# Patient Record
Sex: Female | Born: 1935 | Race: White | Hispanic: No | Smoking: Former smoker
Health system: Southern US, Community
[De-identification: ages and names within clinical notes are randomized; demographics above are authoritative.]

## PROBLEM LIST (undated history)

## (undated) DIAGNOSIS — I639 Cerebral infarction, unspecified: Secondary | ICD-10-CM

## (undated) DIAGNOSIS — E039 Hypothyroidism, unspecified: Secondary | ICD-10-CM

## (undated) DIAGNOSIS — D126 Benign neoplasm of colon, unspecified: Secondary | ICD-10-CM

## (undated) DIAGNOSIS — C3490 Malignant neoplasm of unspecified part of unspecified bronchus or lung: Secondary | ICD-10-CM

## (undated) DIAGNOSIS — R011 Cardiac murmur, unspecified: Secondary | ICD-10-CM

## (undated) DIAGNOSIS — I219 Acute myocardial infarction, unspecified: Secondary | ICD-10-CM

## (undated) DIAGNOSIS — Z720 Tobacco use: Secondary | ICD-10-CM

## (undated) DIAGNOSIS — I714 Abdominal aortic aneurysm, without rupture, unspecified: Secondary | ICD-10-CM

## (undated) DIAGNOSIS — I10 Essential (primary) hypertension: Secondary | ICD-10-CM

## (undated) DIAGNOSIS — H269 Unspecified cataract: Secondary | ICD-10-CM

## (undated) DIAGNOSIS — E079 Disorder of thyroid, unspecified: Secondary | ICD-10-CM

## (undated) DIAGNOSIS — I739 Peripheral vascular disease, unspecified: Secondary | ICD-10-CM

## (undated) DIAGNOSIS — E785 Hyperlipidemia, unspecified: Secondary | ICD-10-CM

## (undated) DIAGNOSIS — Z923 Personal history of irradiation: Secondary | ICD-10-CM

## (undated) DIAGNOSIS — IMO0002 Reserved for concepts with insufficient information to code with codable children: Secondary | ICD-10-CM

## (undated) DIAGNOSIS — C189 Malignant neoplasm of colon, unspecified: Secondary | ICD-10-CM

## (undated) DIAGNOSIS — H5440 Blindness, one eye, unspecified eye: Secondary | ICD-10-CM

## (undated) DIAGNOSIS — M109 Gout, unspecified: Secondary | ICD-10-CM

## (undated) DIAGNOSIS — R931 Abnormal findings on diagnostic imaging of heart and coronary circulation: Secondary | ICD-10-CM

## (undated) DIAGNOSIS — T8859XA Other complications of anesthesia, initial encounter: Secondary | ICD-10-CM

## (undated) DIAGNOSIS — T4145XA Adverse effect of unspecified anesthetic, initial encounter: Secondary | ICD-10-CM

## (undated) DIAGNOSIS — I2510 Atherosclerotic heart disease of native coronary artery without angina pectoris: Secondary | ICD-10-CM

## (undated) DIAGNOSIS — R1900 Intra-abdominal and pelvic swelling, mass and lump, unspecified site: Secondary | ICD-10-CM

## (undated) DIAGNOSIS — M810 Age-related osteoporosis without current pathological fracture: Secondary | ICD-10-CM

## (undated) DIAGNOSIS — M1990 Unspecified osteoarthritis, unspecified site: Secondary | ICD-10-CM

## (undated) HISTORY — DX: Essential (primary) hypertension: I10

## (undated) HISTORY — DX: Reserved for concepts with insufficient information to code with codable children: IMO0002

## (undated) HISTORY — PX: TONSILLECTOMY AND ADENOIDECTOMY: SUR1326

## (undated) HISTORY — DX: Unspecified cataract: H26.9

## (undated) HISTORY — PX: COLONOSCOPY: SHX174

## (undated) HISTORY — PX: APPENDECTOMY: SHX54

## (undated) HISTORY — DX: Cardiac murmur, unspecified: R01.1

## (undated) HISTORY — DX: Atherosclerotic heart disease of native coronary artery without angina pectoris: I25.10

## (undated) HISTORY — PX: ABDOMINAL HYSTERECTOMY: SHX81

## (undated) HISTORY — PX: CATARACT EXTRACTION W/ INTRAOCULAR LENS  IMPLANT, BILATERAL: SHX1307

## (undated) HISTORY — PX: UPPER GASTROINTESTINAL ENDOSCOPY: SHX188

## (undated) HISTORY — DX: Benign neoplasm of colon, unspecified: D12.6

## (undated) HISTORY — DX: Peripheral vascular disease, unspecified: I73.9

## (undated) HISTORY — DX: Acute myocardial infarction, unspecified: I21.9

## (undated) HISTORY — PX: POLYPECTOMY: SHX149

## (undated) HISTORY — PX: EYE SURGERY: SHX253

## (undated) HISTORY — DX: Age-related osteoporosis without current pathological fracture: M81.0

## (undated) HISTORY — DX: Unspecified osteoarthritis, unspecified site: M19.90

## (undated) HISTORY — DX: Hyperlipidemia, unspecified: E78.5

## (undated) HISTORY — DX: Abdominal aortic aneurysm, without rupture: I71.4

## (undated) HISTORY — DX: Abdominal aortic aneurysm, without rupture, unspecified: I71.40

## (undated) HISTORY — DX: Disorder of thyroid, unspecified: E07.9

## (undated) HISTORY — DX: Malignant neoplasm of colon, unspecified: C18.9

## (undated) HISTORY — DX: Blindness, one eye, unspecified eye: H54.40

## (undated) HISTORY — PX: OOPHORECTOMY: SHX86

## (undated) HISTORY — DX: Tobacco use: Z72.0

## (undated) HISTORY — PX: TUBAL LIGATION: SHX77

## (undated) HISTORY — DX: Malignant neoplasm of unspecified part of unspecified bronchus or lung: C34.90

## (undated) HISTORY — DX: Cerebral infarction, unspecified: I63.9

## (undated) HISTORY — DX: Abnormal findings on diagnostic imaging of heart and coronary circulation: R93.1

---

## 1995-08-02 DIAGNOSIS — I2510 Atherosclerotic heart disease of native coronary artery without angina pectoris: Secondary | ICD-10-CM

## 1995-08-02 HISTORY — DX: Atherosclerotic heart disease of native coronary artery without angina pectoris: I25.10

## 1995-08-02 HISTORY — PX: CARDIAC CATHETERIZATION: SHX172

## 1998-08-21 ENCOUNTER — Encounter: Payer: Self-pay | Admitting: Internal Medicine

## 1998-08-21 ENCOUNTER — Ambulatory Visit (HOSPITAL_COMMUNITY): Admission: RE | Admit: 1998-08-21 | Discharge: 1998-08-21 | Payer: Self-pay | Admitting: Internal Medicine

## 1998-09-15 ENCOUNTER — Ambulatory Visit (HOSPITAL_COMMUNITY): Admission: RE | Admit: 1998-09-15 | Discharge: 1998-09-15 | Payer: Self-pay

## 1998-09-15 ENCOUNTER — Encounter: Payer: Self-pay | Admitting: Internal Medicine

## 1999-06-04 DIAGNOSIS — I639 Cerebral infarction, unspecified: Secondary | ICD-10-CM

## 1999-06-04 HISTORY — DX: Cerebral infarction, unspecified: I63.9

## 1999-11-16 ENCOUNTER — Ambulatory Visit (HOSPITAL_COMMUNITY): Admission: RE | Admit: 1999-11-16 | Discharge: 1999-11-16 | Payer: Self-pay | Admitting: Gastroenterology

## 2004-06-03 HISTORY — PX: RETINAL DETACHMENT SURGERY: SHX105

## 2004-06-11 ENCOUNTER — Ambulatory Visit: Payer: Self-pay | Admitting: Internal Medicine

## 2005-07-19 ENCOUNTER — Ambulatory Visit: Payer: Self-pay | Admitting: Internal Medicine

## 2006-08-21 ENCOUNTER — Ambulatory Visit: Payer: Self-pay | Admitting: Internal Medicine

## 2007-03-12 ENCOUNTER — Ambulatory Visit: Payer: Self-pay | Admitting: Internal Medicine

## 2007-07-16 ENCOUNTER — Encounter: Payer: Self-pay | Admitting: Internal Medicine

## 2007-07-21 ENCOUNTER — Encounter: Payer: Self-pay | Admitting: Internal Medicine

## 2007-08-04 ENCOUNTER — Ambulatory Visit: Payer: Self-pay | Admitting: Gastroenterology

## 2007-09-29 ENCOUNTER — Ambulatory Visit: Payer: Self-pay | Admitting: Gastroenterology

## 2007-09-29 ENCOUNTER — Encounter: Payer: Self-pay | Admitting: Internal Medicine

## 2007-09-29 ENCOUNTER — Encounter: Payer: Self-pay | Admitting: Gastroenterology

## 2007-10-01 ENCOUNTER — Encounter: Payer: Self-pay | Admitting: Gastroenterology

## 2007-10-07 ENCOUNTER — Ambulatory Visit: Payer: Self-pay | Admitting: Internal Medicine

## 2007-10-08 ENCOUNTER — Encounter: Payer: Self-pay | Admitting: Internal Medicine

## 2007-10-08 DIAGNOSIS — I739 Peripheral vascular disease, unspecified: Secondary | ICD-10-CM

## 2007-10-08 DIAGNOSIS — E785 Hyperlipidemia, unspecified: Secondary | ICD-10-CM

## 2007-10-08 DIAGNOSIS — Z8673 Personal history of transient ischemic attack (TIA), and cerebral infarction without residual deficits: Secondary | ICD-10-CM

## 2007-10-08 DIAGNOSIS — Z860100 Personal history of colon polyps, unspecified: Secondary | ICD-10-CM | POA: Insufficient documentation

## 2007-10-08 DIAGNOSIS — I10 Essential (primary) hypertension: Secondary | ICD-10-CM

## 2007-11-06 ENCOUNTER — Telehealth: Payer: Self-pay | Admitting: Internal Medicine

## 2007-11-18 ENCOUNTER — Encounter: Payer: Self-pay | Admitting: Internal Medicine

## 2007-12-08 ENCOUNTER — Telehealth: Payer: Self-pay | Admitting: Internal Medicine

## 2007-12-16 ENCOUNTER — Encounter: Payer: Self-pay | Admitting: Internal Medicine

## 2008-01-20 ENCOUNTER — Ambulatory Visit: Payer: Self-pay | Admitting: Internal Medicine

## 2008-01-20 DIAGNOSIS — R32 Unspecified urinary incontinence: Secondary | ICD-10-CM | POA: Insufficient documentation

## 2008-01-21 ENCOUNTER — Encounter (INDEPENDENT_AMBULATORY_CARE_PROVIDER_SITE_OTHER): Payer: Self-pay | Admitting: *Deleted

## 2008-02-04 ENCOUNTER — Encounter: Payer: Self-pay | Admitting: Internal Medicine

## 2008-04-01 ENCOUNTER — Encounter: Payer: Self-pay | Admitting: Internal Medicine

## 2008-04-13 ENCOUNTER — Encounter: Payer: Self-pay | Admitting: Internal Medicine

## 2008-11-17 ENCOUNTER — Ambulatory Visit: Payer: Self-pay | Admitting: Internal Medicine

## 2008-11-17 LAB — CONVERTED CEMR LAB
ALT: 23 units/L (ref 0–35)
AST: 21 units/L (ref 0–37)
Albumin: 3.6 g/dL (ref 3.5–5.2)
Alkaline Phosphatase: 51 units/L (ref 39–117)
BUN: 19 mg/dL (ref 6–23)
Bilirubin, Direct: 0.1 mg/dL (ref 0.0–0.3)
CO2: 29 meq/L (ref 19–32)
Calcium: 9.4 mg/dL (ref 8.4–10.5)
Chloride: 104 meq/L (ref 96–112)
Cholesterol: 135 mg/dL (ref 0–200)
Creatinine, Ser: 1.1 mg/dL (ref 0.4–1.2)
Direct LDL: 76.7 mg/dL
GFR calc non Af Amer: 51.73 mL/min (ref >60)
Glucose, Bld: 99 mg/dL (ref 70–99)
HDL: 29.3 mg/dL — ABNORMAL LOW (ref >39.00)
Potassium: 3.9 meq/L (ref 3.5–5.1)
Sodium: 141 meq/L (ref 135–145)
TSH: 5.47 microintl units/mL (ref 0.35–5.50)
Total Bilirubin: 0.5 mg/dL (ref 0.3–1.2)
Total CHOL/HDL Ratio: 5
Total Protein: 6.7 g/dL (ref 6.0–8.3)
Triglycerides: 227 mg/dL — ABNORMAL HIGH (ref 0.0–149.0)
VLDL: 45.4 mg/dL — ABNORMAL HIGH (ref 0.0–40.0)

## 2009-03-13 ENCOUNTER — Encounter: Payer: Self-pay | Admitting: Internal Medicine

## 2009-03-27 ENCOUNTER — Encounter: Payer: Self-pay | Admitting: Internal Medicine

## 2009-07-12 ENCOUNTER — Ambulatory Visit: Payer: Self-pay | Admitting: Internal Medicine

## 2009-11-21 ENCOUNTER — Encounter: Payer: Self-pay | Admitting: Internal Medicine

## 2009-11-21 ENCOUNTER — Ambulatory Visit: Payer: Self-pay | Admitting: Internal Medicine

## 2009-11-21 DIAGNOSIS — H4089 Other specified glaucoma: Secondary | ICD-10-CM

## 2009-11-21 DIAGNOSIS — Q159 Congenital malformation of eye, unspecified: Secondary | ICD-10-CM

## 2009-11-21 LAB — CONVERTED CEMR LAB
ALT: 22 units/L (ref 0–35)
AST: 23 units/L (ref 0–37)
Albumin: 3.7 g/dL (ref 3.5–5.2)
Alkaline Phosphatase: 47 units/L (ref 39–117)
BUN: 17 mg/dL (ref 6–23)
Basophils Absolute: 0 10*3/uL (ref 0.0–0.1)
Basophils Relative: 0.5 % (ref 0.0–3.0)
Bilirubin, Direct: 0.1 mg/dL (ref 0.0–0.3)
CO2: 30 meq/L (ref 19–32)
Calcium: 9.1 mg/dL (ref 8.4–10.5)
Chloride: 105 meq/L (ref 96–112)
Cholesterol: 113 mg/dL (ref 0–200)
Creatinine, Ser: 0.9 mg/dL (ref 0.4–1.2)
Eosinophils Absolute: 0.2 10*3/uL (ref 0.0–0.7)
Eosinophils Relative: 2.8 % (ref 0.0–5.0)
GFR calc non Af Amer: 61.85 mL/min (ref >60)
Glucose, Bld: 104 mg/dL — ABNORMAL HIGH (ref 70–99)
HCT: 40.7 % (ref 36.0–46.0)
HDL: 35.9 mg/dL — ABNORMAL LOW (ref >39.00)
Hemoglobin: 13.7 g/dL (ref 12.0–15.0)
LDL Cholesterol: 37 mg/dL (ref 0–99)
Lymphocytes Relative: 24 % (ref 12.0–46.0)
Lymphs Abs: 1.7 10*3/uL (ref 0.7–4.0)
MCHC: 33.8 g/dL (ref 30.0–36.0)
MCV: 92 fL (ref 78.0–100.0)
Monocytes Absolute: 0.4 10*3/uL (ref 0.1–1.0)
Monocytes Relative: 5.3 % (ref 3.0–12.0)
Neutro Abs: 4.9 10*3/uL (ref 1.4–7.7)
Neutrophils Relative %: 67.4 % (ref 43.0–77.0)
Platelets: 191 10*3/uL (ref 150.0–400.0)
Potassium: 4.1 meq/L (ref 3.5–5.1)
RBC: 4.42 M/uL (ref 3.87–5.11)
RDW: 13.4 % (ref 11.5–14.6)
Sodium: 143 meq/L (ref 135–145)
Total Bilirubin: 0.5 mg/dL (ref 0.3–1.2)
Total CHOL/HDL Ratio: 3
Total Protein: 6.3 g/dL (ref 6.0–8.3)
Triglycerides: 199 mg/dL — ABNORMAL HIGH (ref 0.0–149.0)
VLDL: 39.8 mg/dL (ref 0.0–40.0)
WBC: 7.2 10*3/uL (ref 4.5–10.5)

## 2009-11-22 ENCOUNTER — Telehealth: Payer: Self-pay | Admitting: Internal Medicine

## 2009-11-22 ENCOUNTER — Encounter: Payer: Self-pay | Admitting: Internal Medicine

## 2009-11-29 ENCOUNTER — Encounter: Payer: Self-pay | Admitting: Internal Medicine

## 2009-12-18 ENCOUNTER — Telehealth: Payer: Self-pay | Admitting: Internal Medicine

## 2010-03-15 ENCOUNTER — Encounter: Payer: Self-pay | Admitting: Internal Medicine

## 2010-04-17 ENCOUNTER — Encounter: Payer: Self-pay | Admitting: Internal Medicine

## 2010-06-24 ENCOUNTER — Encounter: Payer: Self-pay | Admitting: Internal Medicine

## 2010-07-03 NOTE — Progress Notes (Signed)
Summary: X-ray request  Phone Note Call from Patient Call back at Home Phone [phone]   Summary of Call: Patient left message on triage that she was in x-ray yesterday when the machine broke and she could not complete the x-ray. She would like to know if her order could be sent to [facility] which is closer to her home. Please advise. Initial call taken by: Lucious Groves,  November 22, 2009 2:25 PM  Follow-up for Phone Call        ok to send order to [facility]. Follow-up by: Jacques Navy MD,  November 22, 2009 2:34 PM  Additional Follow-up for Phone Call Additional follow up Details #1::        Patient notified, and will call back with appt date so I can add it to her order. Additional Follow-up by: Lucious Groves,  November 22, 2009 3:27 PM    Additional Follow-up for Phone Call Additional follow up Details #2::    Patient made me aware her appt is for the 29th. Follow-up by: Lucious Groves,  November 24, 2009 2:30 PM

## 2010-07-03 NOTE — Progress Notes (Signed)
Summary: MELOXICAM  Phone Note Call from Patient Call back at [facility] Phone [phone]   Summary of Call: Pt c/o itching from last rx for arthritis. She is req to resume meloxicam.  Initial call taken by: Lamar Sprinkles, CMA,  December 18, 2009 11:26 AM  Follow-up for Phone Call        ok to use  meloxicam 15mg  once daily #30, refill prn Follow-up by: Jacques Navy MD,  December 18, 2009 12:01 PM    New/Updated Medications: MELOXICAM 15 MG TABS (MELOXICAM) 1 once daily Prescriptions: MELOXICAM 15 MG TABS (MELOXICAM) 1 once daily  #30 x 1   Entered by:   Ami Bullins CMA   Authorized by:   Jacques Navy MD   Signed by:   Bill Salinas CMA on 12/18/2009   Method used:   Electronically to        [facility]. [phone]* (retail)       [address]       [place], [place]  [address]       Ph: [phone] or [phone]       Fax: [phone]   RxID:   [id number]

## 2010-07-03 NOTE — Assessment & Plan Note (Signed)
Summary: CONGESTION / SINUS / NO FEVER /NWS   #   Vital Signs:  Patient profile:   77 year old female Height:      62 inches Weight:      151 pounds BMI:     27.72 O2 Sat:      97 % on Room air Temp:     97.6 degrees F oral Pulse rate:   63 / minute Pulse rhythm:   regular Resp:     16 per minute BP sitting:   122 / 64  (left arm) Cuff size:   large  Vitals Entered By: Rock Nephew CMA (July 12, 2009 2:11 PM)  Nutrition Counseling: Patient's BMI is greater than 25 and therefore counseled on weight management options.  O2 Flow:  Room air  Primary Care Provider:  Norins  CC:  URI symptoms.  History of Present Illness:  URI Symptoms      This is a 76 year old woman who presents with URI symptoms.  The symptoms began 3 weeks ago.  The severity is described as mild.  The patient reports purulent nasal discharge, sore throat, and sick contacts, but denies dry cough, productive cough, and earache.  The patient denies fever, stiff neck, dyspnea, wheezing, rash, vomiting, diarrhea, use of an antipyretic, and response to antipyretic.  The patient also reports headache.  The patient denies muscle aches and severe fatigue.  Risk factors for Strep sinusitis include unilateral facial pain, unilateral nasal discharge, double sickening, tooth pain, and absence of cough.  The patient denies the following risk factors for Strep sinusitis: Strep exposure and tender adenopathy.    Preventive Screening-Counseling & Management  Alcohol-Tobacco     Alcohol drinks/day: <1     Alcohol type: spirits     Alcohol Counseling: not indicated; use of alcohol is not excessive or problematic     Smoking Status: current     Smoking Cessation Counseling: yes     Smoke Cessation Stage: precontemplative     Packs/Day: 1.0     Year Started: 1950     Year Quit: 62     Passive Smoke Exposure: no      Drug Use:  no.    Medications Prior to Update: 1)  Plavix 75 Mg Tabs (Clopidogrel Bisulfate) .... Take  1 Tab By Mouth Daily, Pt Needs Office Visit in March or April 2)  Metoprolol Tartrate 50 Mg  Tabs (Metoprolol Tartrate) .... Take 1/2 Tablet By Mouth Two Times A Day 3)  Diltiazem Hcl 120 Mg  Tabs (Diltiazem Hcl) .... Take 1 Tablet By Mouth Once A Day 4)  Lisinopril-Hydrochlorothiazide 20-12.5 Mg  Tabs (Lisinopril-Hydrochlorothiazide) .... Take 1 Tablet By Mouth Once A Day 5)  Simvastatin 80 Mg  Tabs (Simvastatin) .... Take 1 Tab By Mouth At Bedtime 6)  Tylenol Extra Strength 500 Mg  Tabs (Acetaminophen) .... As Needed 7)  Meloxicam 15 Mg  Tabs (Meloxicam) .Marland Kitchen.. 1 By Mouth Once Daily  Current Medications (verified): 1)  Plavix 75 Mg Tabs (Clopidogrel Bisulfate) .... Take 1 Tab By Mouth Daily, Pt Needs Office Visit in March or April 2)  Metoprolol Tartrate 50 Mg  Tabs (Metoprolol Tartrate) .... Take 1/2 Tablet By Mouth Two Times A Day 3)  Diltiazem Hcl 120 Mg  Tabs (Diltiazem Hcl) .... Take 1 Tablet By Mouth Once A Day 4)  Lisinopril-Hydrochlorothiazide 20-12.5 Mg  Tabs (Lisinopril-Hydrochlorothiazide) .... Take 1 Tablet By Mouth Once A Day 5)  Simvastatin 80 Mg  Tabs (Simvastatin) .Marland KitchenMarland KitchenMarland Kitchen  Take 1 Tab By Mouth At Bedtime 6)  Tylenol Extra Strength 500 Mg  Tabs (Acetaminophen) .... As Needed 7)  Meloxicam 15 Mg  Tabs (Meloxicam) .Marland Kitchen.. 1 By Mouth Once Daily  Allergies (verified): No Known Drug Allergies  Past History:  Past Medical History: Reviewed history from 10/07/2007 and no changes required. UCD Colonic polyps, hx of Coronary artery disease-cath '97 100% circ, 2nd Dx 50-60%, RCA dominant -50%: h/o MI Hyperlipidemia Hypertension Peripheral vascular disease Cerebrovascular accident, hx of     physician Roster:               Card - Dr. Allyson Sabal               GI - Dr. Dominica Severin - Dr.Jacklin  Past Surgical History: Reviewed history from 01/20/2008 and no changes required. retinal tear repair Cataract extraction-belateral w/ IOL Tubal ligation Hysterectomy with A-P  suspension '60's    G5P5 with several large babies.  Family History: Reviewed history from 10/07/2007 and no changes required. father- deceased @ 62: CAD/MI fatal, had 2 previous MI's, HTN mother - deceased @ 33: AAA-failed surgery, HTN Neg- colon cancer, DM Maternal aunt - breast cancer  Social History: Reviewed history from 10/07/2007 and no changes required. 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 End-of-life: No CPR, no mechanical ventilation, no futile/heroic measures.  Review of Systems  The patient denies anorexia, fever, weight loss, chest pain, syncope, peripheral edema, prolonged cough, headaches, hemoptysis, abdominal pain, hematuria, suspicious skin lesions, depression, and enlarged lymph nodes.    Physical Exam  General:  Well-developed,well-nourished,in no acute distress; alert,appropriate and cooperative throughout examination Head:  normocephalic and atraumatic.   Ears:  R ear normal and L ear normal.   Nose:  no external erythema, no mucosal edema, no airflow obstruction, no intranasal foreign body, no nasal polyps, no nasal mucosal lesions, no mucosal friability, no active bleeding or clots, nasal dischargemucosal pallor, L maxillary sinus tenderness, and R maxillary sinus tenderness.   Mouth:  no oral lesions Neck:  supple, no thyromegaly, and no carotid bruits.   Lungs:  normal respiratory effort, no intercostal retractions, no accessory muscle use, normal breath sounds, no dullness, no fremitus, no crackles, and no wheezes.   Heart:  normal rate, regular rhythm, and grade 1/6 HSM over RUSB.  normal rate, regular rhythm, no gallop, no rub. Abdomen:  Bowel sounds positive,abdomen soft and non-tender without masses, organomegaly or hernias noted. Msk:  No deformity or scoliosis  noted of thoracic or lumbar spine.   Pulses:  R and L carotid,radial,femoral,dorsalis pedis and posterior tibial pulses are full and equal bilaterally Extremities:  No clubbing, cyanosis, edema, or deformity noted with normal full range of motion of all joints.   Neurologic:  No cranial nerve deficits noted. Station and gait are normal. Plantar reflexes are down-going bilaterally. DTRs are symmetrical throughout. Sensory, motor and coordinative functions appear intact. Skin:  Intact without suspicious lesions or rashes Cervical Nodes:  no anterior cervical adenopathy and no posterior cervical adenopathy.   Axillary Nodes:  no R axillary adenopathy and no L axillary adenopathy.   Inguinal Nodes:  no R inguinal adenopathy and no L inguinal adenopathy.   Psych:  Cognition and judgment appear intact.  Alert and cooperative with normal attention span and concentration. No apparent delusions, illusions, hallucinations   Impression & Recommendations:  Problem # 1:  SINUSITIS- ACUTE-NOS (ICD-461.9) Assessment New  Her updated medication list for this problem includes:    Amoxicillin 500 Mg Cap (Amoxicillin) .Marland Kitchen... Take 1 capsule by mouth three times a day x 10 days  Instructed on treatment. Call if symptoms persist or worsen.   Orders: Prescription Created Electronically [phone])  Problem # 2:  TOBACCO ABUSE (ICD-305.1) Assessment: Unchanged  Encouraged smoking cessation and discussed different methods for smoking cessation.   Complete Medication List: 1)  Plavix 75 Mg Tabs (Clopidogrel bisulfate) .... Take 1 tab by mouth daily, pt needs office visit in march or april 2)  Metoprolol Tartrate 50 Mg Tabs (Metoprolol tartrate) .... Take 1/2 tablet by mouth two times a day 3)  Diltiazem Hcl 120 Mg Tabs (Diltiazem hcl) .... Take 1 tablet by mouth once a day 4)  Lisinopril-hydrochlorothiazide 20-12.5 Mg Tabs (Lisinopril-hydrochlorothiazide) .... Take 1 tablet by mouth once a day 5)  Simvastatin 80  Mg Tabs (Simvastatin) .... Take 1 tab by mouth at bedtime 6)  Tylenol Extra Strength 500 Mg Tabs (Acetaminophen) .... As needed 7)  Meloxicam 15 Mg Tabs (Meloxicam) .Marland Kitchen.. 1 by mouth once daily 8)  Amoxicillin 500 Mg Cap (Amoxicillin) .... Take 1 capsule by mouth three times a day x 10 days  Patient Instructions: 1)  Please schedule a follow-up appointment in 2 weeks. 2)  Take your antibiotic as prescribed until ALL of it is gone, but stop if you develop a rash or swelling and contact our office as soon as possible. 3)  Acute sinusitis symptoms for less than 10 days are not helped by antibiotics.Use warm moist compresses, and over the counter decongestants ( only as directed). Call if no improvement in 5-7 days, sooner if increasing pain, fever, or new symptoms. Prescriptions: AMOXICILLIN 500 MG CAP (AMOXICILLIN) Take 1 capsule by mouth three times a day X 10 days  #30 x 0   Entered and Authorized by:   Etta Grandchild MD   Signed by:   Etta Grandchild MD on 07/12/2009   Method used:   Electronically to        [facility]. [phone]* (retail)       [address]       [place], [place]  [address]       Ph: [phone] or [phone]       Fax: [phone]   RxID:   [id number]

## 2010-07-03 NOTE — Letter (Signed)
Summary: [facility]  [facility]   Imported By: Esmeralda Links D'jimraou 03/26/2010 10:42:51  _____________________________________________________________________  External Attachment:    Type:   Image     Comment:   External Document

## 2010-07-03 NOTE — Assessment & Plan Note (Signed)
Summary: YEARLY FU / LABS AFTER/ MEDICARE/NWS/  #   Vital Signs:  Patient profile:   77 year old female Height:      62 inches Weight:      144 pounds BMI:     26.43 O2 Sat:      95 % on Room air Temp:     97.3 degrees F oral Pulse rate:   56 / minute BP sitting:   112 / 58  (left arm) Cuff size:   regular  Vitals Entered By: Bill Salinas CMA (November 21, 2009 10:48 AM)  O2 Flow:  Room air CC: pt here for yearly f/u/ ab  Vision Screening:      Vision Comments: Pt had eye exam may 2011 with Dr Lottie Dawson and it was discovered that she had glaucoma and pressure build up in her left eye   Primary Care Provider:  Elina Streng  CC:  pt here for yearly f/u/ ab.  History of Present Illness: Patient presents for routine medical fdllow-up. IN the interval since her last visit she was seen Feb 9th '11 by Dr Yetta Barre for sinusitis and was successfully treated with Amoxicillin. She has had no other illness, no injuries no surgeries.   She is overdue for mammogram. She does continue to smoke. She last saw Dr. Allyson Sabal 03/13/09 and was cardiac stable. She did come to [facility] for outflow track murmur - nl EF, mild MVR, TVR andmild AV stenosis.  She has been seeing opthalmologist - has early glaucoma and is using an eye drop - she sees Dr. Gwendalyn Ege, [facility] [place], [facility] at [place] office.  Current Medications (verified): 1)  Plavix 75 Mg Tabs (Clopidogrel Bisulfate) .... Take 1 Tab By Mouth Daily, Pt Needs Office Visit in March or April 2)  Metoprolol Tartrate 50 Mg  Tabs (Metoprolol Tartrate) .... Take 1/2 Tablet By Mouth Two Times A Day 3)  Diltiazem Hcl 120 Mg  Tabs (Diltiazem Hcl) .... Take 1 Tablet By Mouth Once A Day 4)  Lisinopril-Hydrochlorothiazide 20-12.5 Mg  Tabs (Lisinopril-Hydrochlorothiazide) .... Take 1 Tablet By Mouth Once A Day 5)  Simvastatin 80 Mg  Tabs (Simvastatin) .... Take 1 Tab By Mouth At Bedtime 6)  Tylenol Extra Strength 500 Mg  Tabs (Acetaminophen) .... 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 End-of-life: No CPR, no mechanical ventilation, no futile/heroic measures.  Risk Factors: Alcohol Use: <1 (07/12/2009) Caffeine Use: 3 glasses of tea per day (11/17/2008) Diet: good diet (11/17/2008) Exercise: no (11/17/2008)  Risk Factors: Smoking Status: current (07/12/2009) Packs/Day: 1.0 (07/12/2009) Passive Smoke Exposure: no (07/12/2009)  Past Medical History: UCD GLAUCOMA ASSOCIATED W/OTH ANT SEGMENT ANOMALIES (ICD-365.43) UNSPECIFIED URINARY INCONTINENCE (ICD-788.30) TOBACCO ABUSE (ICD-305.1) CEREBROVASCULAR ACCIDENT, HX OF (ICD-V12.50) PERIPHERAL VASCULAR DISEASE (ICD-443.9) HYPERTENSION (ICD-401.9) HYPERLIPIDEMIA (ICD-272.4) CORONARY ARTERY DISEASE (ICD-414.00) COLONIC POLYPS, HX OF (ICD-V12.72)       physician Roster:               Card - Dr. Allyson Sabal  GI - Dr. Dominica Severin - Dr.Jacklin  Past Surgical History: retinal tear repairx2 Cataract extraction-belateral w/ IOL Tubal ligation Hysterectomy with A-P suspension '60's    G5P5 with several large babies.  Family History: Reviewed history from 10/07/2007 and no changes required. father- deceased @ 27: CAD/MI fatal, had 2 previous MI's, HTN mother - deceased @ 84: AAA-failed surgery, HTN Neg- colon cancer, DM Maternal aunt - breast cancer  Social History: Reviewed history from 10/07/2007 and no changes required. 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 End-of-life: No CPR, no mechanical ventilation, no futile/heroic measures.  Review of Systems       The patient complains of vision loss and incontinence.  The patient denies anorexia, fever, weight loss, weight gain, decreased hearing, chest pain, syncope, dyspnea on exertion, peripheral edema, prolonged cough, abdominal pain, melena, hematochezia, severe indigestion/heartburn, muscle weakness, suspicious skin lesions, difficulty walking, depression, unusual weight change, enlarged lymph nodes, angioedema, and breast masses.         no vision in left eye. Has bilateral hip pain.   Physical Exam  General:  Well-developed,well-nourished,in no acute distress; alert,appropriate and cooperative throughout examination Head:  Normocephalic and atraumatic without obvious abnormalities. No apparent alopecia or balding. Eyes:  no vision left eye. Right eye appears normal. C&S clear Ears:  External ear exam shows no significant lesions or deformities.  Otoscopic examination reveals clear canals, tympanic membranes are intact bilaterally without bulging, retraction, inflammation or discharge. Hearing is grossly normal bilaterally. Nose:  External nasal examination shows no deformity or inflammation. Nasal mucosa are pink and moist without lesions or exudates. Mouth:  edentulous, no oral lesions Neck:  supple, full ROM, no thyromegaly, and no carotid bruits.   Chest Wall:  no deformities.   Breasts:  No mass, nodules, thickening, tenderness, bulging, retraction, inflamation, nipple discharge or skin changes noted.   Lungs:  normal respiratory effort, no intercostal retractions, no accessory muscle use, normal breath sounds, no dullness, no crackles, and no wheezes.   Heart:  normal rate,  regular rhythm, no murmur, and no JVD.   Abdomen:  soft, non-tender, normal bowel sounds, no guarding, no rigidity, and no hepatomegaly.   Genitalia:  deferred Msk:  normal ROM, no joint tenderness, no joint swelling, no joint warmth, and no redness over joints.   Pulses:  2+ radial and DP pulses Extremities:  No clubbing, cyanosis, edema, or deformity noted with normal full range of motion of all joints.   Neurologic:  alert & oriented X3, cranial nerves II-XII intact, strength normal in all extremities, sensation intact to light touch, gait normal, and DTRs symmetrical and normal.   Skin:  erythematous/plethoric, several bruises on arms. Cervical Nodes:  No lymphadenopathy noted Axillary Nodes:  no R axillary adenopathy and no L axillary adenopathy.   Psych:  Oriented X3, memory intact for recent and remote, normally interactive, good eye contact, and not anxious appearing.     Impression & Recommendations:  Problem # 1:  GLAUCOMA ASSOCIATED W/OTH ANT SEGMENT ANOMALIES (ICD-365.43) total vision loss OS. She is under active treatment by Dr. Gwendalyn Ege  Problem # 2:  TOBACCO ABUSE (ICD-305.1) Assessment: Unchanged Stressed the importance of smoking cessation in relation to her other disease. Counselled on behavior  stratagies. In office PFTs with moderate COPD FVC 68% predicted.   Orders: T-2 View CXR (71020TC) Tobacco use cessation intermediate 3-10 minutes (99406) Spirometry w/Graph (94010)  Problem # 3:  HYPERTENSION (ICD-401.9)  Her updated medication list for this problem includes:    Metoprolol Tartrate 50 Mg Tabs (Metoprolol tartrate) .Marland Kitchen... Take 1/2 tablet by mouth two times a day    Diltiazem Hcl 120 Mg Tabs (Diltiazem hcl) .Marland Kitchen... Take 1 tablet by mouth once a day    Lisinopril-hydrochlorothiazide 20-12.5 Mg Tabs (Lisinopril-hydrochlorothiazide) .Marland Kitchen... Take 1 tablet by mouth once a day  Orders: TLB-BMP (Basic Metabolic Panel-BMET) (80048-METABOL)  BP today: 112/58 Prior BP:  122/64 (07/12/2009)  Good control on present medications.  Problem # 4:  HYPERLIPIDEMIA (ICD-272.4) Due for lab with recommendations to follow. Of note: in line with new guidelines re: simvastatin-patient hs been on high dose for over a year and has had no complications, i.e. myositis.  Her updated medication list for this problem includes:    Simvastatin 80 Mg Tabs (Simvastatin) .Marland Kitchen... Take 1 tab by mouth at bedtime  Orders: TLB-Lipid Panel (80061-LIPID) TLB-Hepatic/Liver Function Pnl (80076-HEPATIC)  Addendum - LDL 37! excellent control.  Problem # 5:  CORONARY ARTERY DISEASE (ICD-414.00) Stable. She does not have any stents with only moderate obstructive disease. She has been on Plavix - no cardiac indication.  Plan - continue all meds except plavix  The following medications were removed from the medication list:    Plavix 75 Mg Tabs (Clopidogrel bisulfate) .Marland Kitchen... Take 1 tab by mouth daily, pt needs office visit in march or april Her updated medication list for this problem includes:    Metoprolol Tartrate 50 Mg Tabs (Metoprolol tartrate) .Marland Kitchen... Take 1/2 tablet by mouth two times a day    Diltiazem Hcl 120 Mg Tabs (Diltiazem hcl) .Marland Kitchen... Take 1 tablet by mouth once a day    Lisinopril-hydrochlorothiazide 20-12.5 Mg Tabs (Lisinopril-hydrochlorothiazide) .Marland Kitchen... Take 1 tablet by mouth once a day    Aspirin 325 Mg Tabs (Aspirin) .Marland Kitchen... 1 by mouth once daily  Problem # 6:  CEREBROVASCULAR ACCIDENT, HX OF (ICD-V12.50) She has been on plavix since '01. Evidence is poor for any advantage of plavix over aspirin after 1 year of treatment.   Plan - d/c plavix           start ASA 325mg  once daily.  Orders: TLB-CBC Platelet - w/Differential (85025-CBCD)  Problem # 7:  Preventive Health Care (ICD-V70.0) history with no acute events or changes. Physical exam is normal. Labs are within normal limits.Patient will get mammogram. She is current with colonoscopy. She is advised to stop smoking. She  has no safety issues re: falls or injury. She shows no sign of depression. She is previously counselled on end of life care and her wishes. zostavax is provideed this visit.  In summary - a very nice woman who has multiple medical problems but appears stable at this time. She will return as needed or 6 months.  Complete Medication List: 1)  Metoprolol Tartrate 50 Mg Tabs (Metoprolol tartrate) .... Take 1/2 tablet by mouth two times a day 2)  Diltiazem Hcl 120 Mg Tabs (Diltiazem hcl) .... Take 1 tablet by mouth once a day 3)  Lisinopril-hydrochlorothiazide 20-12.5 Mg Tabs (Lisinopril-hydrochlorothiazide) .... Take 1 tablet by mouth once a day 4)  Simvastatin 80 Mg Tabs (Simvastatin) .... Take 1 tab by mouth at bedtime 5)  Tylenol Extra Strength 500 Mg Tabs (Acetaminophen) .... As needed 6)  Aspirin 325 Mg Tabs (  Aspirin) .Marland Kitchen.. 1 by mouth once daily 7)  Nabumetone 750 Mg Tabs (Nabumetone) .... 2 by mouth at bedtime for arthritic pain hips  Other Orders: Zoster (Shingles) Vaccine Live [phone]) Admin 1st Vaccine (72536)  Patient: Tammy Brooks Note: All result statuses are Final unless otherwise noted.  Tests: (1) BMP (METABOL)   Sodium                    143 mEq/L                   135-145   Potassium                 4.1 mEq/L                   3.5-5.1   Chloride                  105 mEq/L                   96-112   Carbon Dioxide            30 mEq/L                    19-32   Glucose              [H]  104 mg/dL                   64-40   BUN                       17 mg/dL                    3-47   Creatinine                0.9 mg/dL                   4.2-5.9   Calcium                   9.1 mg/dL                   5.6-38.7   GFR                       61.85 mL/min                >60  Tests: (2) Lipid Panel (LIPID)   Cholesterol               113 mg/dL                   5-643     ATP III Classification            Desirable:  < 200 mg/dL                    Borderline High:  200 - 239 mg/dL                High:  > = 240 mg/dL   Triglycerides        [H]  199.0 mg/dL                 3.2-951.8     Normal:  <150 mg/dL     Borderline High:  841 - 199 mg/dL   HDL                  [  L]  35.90 mg/dL                 >44.03   VLDL Cholesterol          39.8 mg/dL                  4.7-42.5   LDL Cholesterol           37 mg/dL                    9-56  CHO/HDL Ratio:  CHD Risk                             3                    Men          Women     1/2 Average Risk     3.4          3.3     Average Risk          5.0          4.4     2X Average Risk          9.6          7.1     3X Average Risk          15.0          11.0                           Tests: (3) Hepatic/Liver Function Panel (HEPATIC)   Total Bilirubin           0.5 mg/dL                   3.8-7.5   Direct Bilirubin          0.1 mg/dL                   6.4-3.3   Alkaline Phosphatase      47 U/L                      39-117   AST                       23 U/L                      0-37   ALT                       22 U/L                      0-35   Total Protein             6.3 g/dL                    2.9-5.1   Albumin                   3.7 g/dL                    8.8-4.1  Tests: (4) CBC Platelet w/Diff (CBCD)   White Cell Count          7.2 K/uL  4.5-10.5   Red Cell Count            4.42 Mil/uL                 3.87-5.11   Hemoglobin                13.7 g/dL                   36.6-44.0   Hematocrit                40.7 %                      36.0-46.0   MCV                       92.0 fl                     78.0-100.0   MCHC                      33.8 g/dL                   34.7-42.5   RDW                       13.4 %                      11.5-14.6   Platelet Count            191.0 K/uL                  150.0-400.0   Neutrophil %              67.4 %                      43.0-77.0   Lymphocyte %              24.0 %                      12.0-46.0   Monocyte %                5.3 %                       3.0-12.0    Eosinophils%              2.8 %                       0.0-5.0   Basophils %               0.5 %                       0.0-3.0   Neutrophill Absolute      4.9 K/uL                    1.4-7.7   Lymphocyte Absolute       1.7 K/uL                    0.7-4.0   Monocyte Absolute         0.4 K/uL  0.1-1.0  Eosinophils, Absolute                             0.2 K/uL                    0.0-0.7   Basophils Absolute        0.0 K/uL                    0.0-0.1Prescriptions: SIMVASTATIN 80 MG  TABS (SIMVASTATIN) Take 1 tab by mouth at bedtime  #30.0 Each x 12   Entered and Authorized by:   Jacques Navy MD   Signed by:   Jacques Navy MD on 11/21/2009   Method used:   Electronically to        [facility]. [phone]* (retail)       [address]       [place], [place]  [address]       Ph: [phone] or [phone]       Fax: [phone]   RxID:   [id number] LISINOPRIL-HYDROCHLOROTHIAZIDE 20-12.5 MG  TABS (LISINOPRIL-HYDROCHLOROTHIAZIDE) Take 1 tablet by mouth once a day  #30.0 Each x 12   Entered and Authorized by:   Jacques Navy MD   Signed by:   Jacques Navy MD on 11/21/2009   Method used:   Electronically to        [facility]. [phone]* (retail)       [address]       [place], [place]  [address]       Ph: [phone] or [phone]       Fax: [phone]   RxID:   [id number] DILTIAZEM HCL 120 MG  TABS (DILTIAZEM HCL) Take 1 tablet by mouth once a day  #30.0 Each x 12   Entered and Authorized by:   Jacques Navy MD   Signed by:   Jacques Navy MD on 11/21/2009   Method used:   Electronically to        [facility]. [phone]* (retail)       [address]       [place], [place]  [address]       Ph: [phone] or [phone]       Fax: [phone]   RxID:   [id number] METOPROLOL TARTRATE 50 MG  TABS (METOPROLOL TARTRATE) Take  1/2 tablet by mouth two times a day  #30.0 Each x 12   Entered and Authorized by:   Jacques Navy MD   Signed by:   Jacques Navy MD on 11/21/2009   Method used:   Electronically to        [facility]. [phone]* (retail)       [address]       [place], [place]  [address]       Ph: [phone] or [phone]       Fax: [phone]   RxID:   [id number] NABUMETONE 750 MG TABS (NABUMETONE) 2 by mouth at bedtime for arthritic pain hips  #60 x 12   Entered and Authorized by:   Jacques Navy MD   Signed by:   Jacques Navy MD on 11/21/2009   Method used:  Electronically to        [facility]. [phone]* (retail)       [address]       [place], [place]  [address]       Ph: [phone] or [phone]       Fax: [phone]   RxID:   [id number]    Preventive Care Screening  Last Flu Shot:    Date:  03/04/2009    Results:  given     Immunizations Administered:  Zostavax # 1:    Vaccine Type: Zostavax    Site: left arm    Mfr: Merck    Dose: 0.5 ml    Route: [place]    Given by: Ami Bullins CMA    Exp. Date: 11/10/2010    Lot #: [id number]    VIS given: 03/15/05 given November 21, 2009.

## 2010-09-07 ENCOUNTER — Other Ambulatory Visit: Payer: Self-pay | Admitting: Internal Medicine

## 2010-10-16 NOTE — Assessment & Plan Note (Signed)
[facility]                         GASTROENTEROLOGY OFFICE NOTE   NAME:Tammy Brooks, Tammy Brooks                         MRN:          [id number]  DATE:08/04/2007                            DOB:          [birth year]    REFERRING PHYSICIAN:  Rosalyn Gess. Norins, MD   PROBLEM:  Personal history of colon polyps.   HISTORY:  This is a pleasant 77 year old white female previous patient  of Dr. Jennye Boroughs who has a history of colon polyps.  She has had several  colonoscopies, the last one was done in May 2006.  At that time, she had  7 polyps removed and was also noted to have moderate left colon  diverticulosis.  Path on those polyps consistent with adenomatous  polyps.  No high-grade dysplasia, and plan was for followup in 3 years.  The patient is not having any current concerns.  No changes in her bowel  habits.  No problems with diarrhea, constipation, or abdominal pain.  Has not noted any melena or hematochezia.   PAST MEDICAL HISTORY:  Include coronary artery disease.  She is status  post MI in 67.  She did not have stents placed at that time.  Has been  maintained on Plavix since.  She is a smoker.  Also has hyperlipidemia.  Has a remote hysterectomy and had recently been diagnosed with glaucoma.  She says she had a detached retina in her left eye recently, and is  having problems with her right eye currently.  She thinks she has a  partially detached retina, and is to follow up with her ophthalmologist,  Dr. Gwendalyn Ege within the month to determine if she is going to require  surgery.  TIA in 2002.   CURRENT MEDICATIONS:  1. Metoprolol 25 b.i.d.  2. Plavix 75 daily.  3. Simvastatin 80 nightly.  4. Diltiazem 120 daily.  5. Lisinopril/hydrochlorothiazide 20/12.5 daily.  6. Prednisolone eye drops.   ALLERGIES:  No known drug allergies.   FAMILY HISTORY:  Negative for colon cancer and polyps.  Father did have  heart disease.  Mother and aunt with breast  cancer.   SOCIAL HISTORY:  The patient is widowed.  Currently lives alone.  Has 5  grown children.  She is retired.  She is a smoker.  She drinks alcohol  socially.   REVIEW OF SYSTEMS:  Pertinent for recent vision problems as described  above.  She has occasional night sweats, chronic problems with low back  pain.  Some stress urinary incontinence and muscle cramping.  Otherwise,  review of systems is as in HPI.   PHYSICAL EXAM:  Well-developed white female in no acute distress.  Height is 5 feet 4 inches, weight 158.4, blood pressure 118/52, pulse  68.  HEENT:  Normocephalic, atraumatic.  PERRLA.  Sclerae anicteric.  NECK:  Supple.  CARDIOVASCULAR:  Regular rate and rhythm with S1, S2.  No murmur, rub,  or gallop.  PULMONARY:  Clear to A and P.  ABDOMEN:  Soft and nontender.  No palpable mass or hepatosplenomegaly.  No guarding or rebound.  Bowel sounds active.  RECTAL:  Not done today.   IMPRESSION:  93. A 77 year old white female with a history of adenomatous colon      polyps, due for followup colonoscopy.  2. Coronary artery disease.  3. Previous history of TIA.  4. Glaucoma and recently detached left retina, and partially detached      right retina.   PLAN:  Schedule colonoscopy.  However, in the interval, will discuss  whether or not this is advisable prior to followup on her right eye or  whether colonoscopy should be deferred until the situation with her  right eye has resolved. We will contact her ophthalmologist. Discontinue  Plavix 5 days pre-procedure.      Mike Gip, PA-C  Electronically Signed      Venita Lick. Russella Dar, MD, [facility]  Electronically Signed   AE/MedQ  DD: 08/04/2007  DT: 08/04/2007  Job #: [id number]   cc:   Rosalyn Gess. Norins, MD

## 2010-10-19 NOTE — Procedures (Signed)
[facility]  Patient:    Tammy Brooks, Tammy Brooks                         MRN: [id number] Proc. Date: 11/16/99 Adm. Date:  [id number] Disc. Date: [id number] Attending:  Deneen Harts CC:         Rosalyn Gess. Norins, M.D. [facility]                           Procedure Report  PROCEDURE:  Colonoscopic polypectomy.  INDICATION FOR PROCEDURE:  A 77 year old white female undergoing colonoscopy with no prior history of colon polyps. Last examined 3 years ago. No interim difficulty.  DESCRIPTION OF PROCEDURE:  After reviewing the nature of the procedure with the patient including potential risks and complications, and after discussing alternative methods of diagnosis and treatment, informed consent was signed.  The patient was premedicated receiving IV sedation totaling Versed 6 mg, fentanyl 75 mcg IV administered in divided doses.  Using the Olympus PCF-140L video colonoscope, the rectum was intubated after a normal digital examination. The scope was inserted in the rectum and advanced around the entire length of the colon to the cecum identified by the appendiceal orifice and ileocecal valve. Good preparation throughout.  The scope was slowly withdrawn with careful inspection of the entire colon in a retrograde manner including retroflexed view in the rectal vault.  In the right colon, proximal, just caudad to the ileocecal valve a 1 cm raised sessile polyp, approximately 1 cm in diameter was identified. It was a bit unusual with a very small central umbilication. It was resected using electrocautery snare, recovered and submitted to pathology. No additional neoplasia was identified throughout the remainder of the colon.  Diverticular change was noted in the sigmoid colon, well isolated, non-inflamed, moderate in extent. No other abnormality was identified.  The colon was decompressed, scope withdrawn.  The patient tolerated the procedure without difficulty being  maintained on DataScope monitor low flow oxygen throughout. Returned to recovery in stable condition.  Time 2, technical 2, preparation 2, total score equals 6.  ASSESSMENT: 1. Right colon polyp, sessile. Snare resection. Pathology pending. 2. Diverticulosis--sigmoid, moderate.  RECOMMENDATION: 1. Post polypectomy instructions reviewed. 2. Follow-up pathology. 3. Repeat colonoscopy in 5 years. DD:  11/16/99 TD:  11/21/99 Job: [id number]

## 2010-10-19 NOTE — Assessment & Plan Note (Signed)
[facility]                           PRIMARY CARE OFFICE NOTE   NAME:Conkright, SHALLEN LUEDKE                         MRN:          [id number]  DATE:08/21/2006                            DOB:          1934/02/09    Ms Tammy Brooks presents for routine followup evaluation and examination.  She  was last seen in the office by Dr. Thomos Lemons July 19, 2005, for  dysuria.  Her last full examination with myself was June 11, 2004.  Please see that complete dictation for past medical history, family  history, social history and chart review.   MEDICAL HISTORY:  1. Influenza-like illness.  The patient reports an illness last week      with nausea, vomiting and abdominal pain which responded to      conservative therapy, and she is now feeling better.  2. Ophthalmology.  The patient developed a second retinal tear in June      2007 and wound up having to have surgical repair at [facility].  She also has been diagnosed with glaucoma and has had      laser therapy.  She currently has lost 90% of her vision in her      left eye.  She is on drops.  She will be returning to her      ophthalmologist and be considered for deep-laser therapy.  3. Peripheral vascular disease.  The patient was found to have a      carotid bruit by Dr. Allyson Sabal, her cardiologist.  She did undergo      carotid Dopplers which by her report were negative.   CURRENT MEDICATIONS:  1. Norvasc 5 mg daily.  2. Metoprolol 25 mg b.i.d.  3. Plavix 75 mg daily.  4. Simvastatin 80 mg at bedtime.  5. Diovan HCT 160/12.5.  6. Travatan, Cosopt and prednisolone eye drops.  7. The patient takes diclofenac p.r.n.   REVIEW OF SYSTEMS:  Is positive for night sweats but otherwise negative  for constitutional symptoms. Ophthalmology for the HPI.  The patient has  full dentures and has had no problems, no cardiovascular, respiratory,  GI problems.  She has had some mild dysuria and bladder pressure. No  musculoskeletal or dermatologic problems.   EXAMINATION:  Temperature was 97.1, blood pressure 100/72, pulse 60,  weight 164.  GENERAL APPEARANCE:  This is a well-nourished, well-developed woman in  no acute distress.  HEENT EXAMINATION: Normocephalic, atraumatic.  EACs and TMs were normal.  OROPHARYNX:  With full dentures.  No buccal lesions were noted.  Posterior pharynx was clear.  Conjunctivae and sclerae was clear.  For  further examination deferred to ophthalmology.  NECK:  Supple without thyromegaly.  NODES:  No adenopathy was noted in the cervical or supraclavicular  regions.  CHEST:  No CVA tenderness.  LUNGS:  Clear to auscultation and percussion.  CARDIOVASCULAR:  2+ radial pulses, no JVD.  She did have bilateral  carotid bruits at the lower part of her neck just above the  supraclavicular space.  She had a regular rate and rhythm  without  murmurs, rubs or gallops.  BREAST EXAMINATION:  Skin was normal, nipple was inverted on the right  which is chronic.  She had no fixed mass lesion or abnormalities on  examination.  There was no axillary adenopathy.  ABDOMEN:  Soft,  no guarding or rebound.  There was no  organosplenomegaly.  PELVIC EXAMINATION:  Was deferred with the patient being status post  hysterectomy.  RECTAL EXAMINATION:  Was deferred.  EXTREMITIES:  Without clubbing, cyanosis, edema.  No deformities were  noted.   ASSESSMENT/PLAN:  1. Hypertension.  The patient's blood pressure is very well      controlled.  She does need to have medication changes for cost      reasons.  She switched from Norvasc 5 to diltiazem 120 mg daily.      She switched from Diovan HCT to lisinopril HCT 20/12.5 mg daily.      The patient is to monitor her blood pressure at home and to notify      the office for systolics of greater 120, diastolics greater than      90.  2. Lipids.  The patient is on simvastatin and followed by Dr. Allyson Sabal.      She does have laboratory at his office.   She will continue to      follow up, and I have asked that copies of the labs be forwarded to      me.  3. Peripheral vascular disease.  The patient does have carotid bruits      low down in the neck which I suspect were common carotid.  The      patient should follow up with Dr. Allyson Sabal as instructed.  4. Ophthalmology.  The patient is under the care of [facility] staff at Dr. Sunday Corn office.  She is seen on a      regular basis and seems stable.  5. Health maintenance.  The patient was given a prescription to have a      mammogram done at [facility].  The patient has had a      colonoscopy most recently by Dr. Ritta Slot November 20, 2004 with an      adenomatous polyp found, and she is to have repeat colonoscopy in      three years.  The patient has decided she will change from Dr.      Kinnie Scales to our practice for ongoing GI care.   SUMMARY:  This a very pleasant woman who seems to be generally medically  stable at this time.  She is to follow up her blood pressure as noted  with change to medications.  She is to return to see me on a p.r.n.  basis.     Rosalyn Gess Norins, MD  Electronically Signed    MEN/MedQ  DD: 08/21/2006  DT: 08/21/2006  Job #: [id number]   cc:   Nanetta Batty, M.D.

## 2010-11-07 ENCOUNTER — Other Ambulatory Visit: Payer: Self-pay | Admitting: Internal Medicine

## 2010-11-15 ENCOUNTER — Encounter: Payer: Self-pay | Admitting: Gastroenterology

## 2010-11-15 ENCOUNTER — Ambulatory Visit (AMBULATORY_SURGERY_CENTER): Payer: Medicare Other | Admitting: *Deleted

## 2010-11-15 VITALS — Ht 64.0 in | Wt 154.0 lb

## 2010-11-15 DIAGNOSIS — Z86010 Personal history of colonic polyps: Secondary | ICD-10-CM

## 2010-11-15 MED ORDER — PEG-KCL-NACL-NASULF-NA ASC-C 100 G PO SOLR: ORAL | Status: DC

## 2010-11-16 ENCOUNTER — Telehealth: Payer: Self-pay | Admitting: Gastroenterology

## 2010-11-19 NOTE — Telephone Encounter (Signed)
PATIENT STILL NOT ANSWERING PHONE,NO ID. DID NOT LEAVE MESSAGE.

## 2010-11-29 ENCOUNTER — Encounter: Payer: Self-pay | Admitting: Gastroenterology

## 2010-11-29 ENCOUNTER — Ambulatory Visit (AMBULATORY_SURGERY_CENTER): Payer: Medicare Other | Admitting: Gastroenterology

## 2010-11-29 VITALS — BP 148/75 | HR 78 | Temp 96.8°F | Resp 18 | Ht 64.0 in | Wt 154.0 lb

## 2010-11-29 DIAGNOSIS — Z86010 Personal history of colonic polyps: Secondary | ICD-10-CM

## 2010-11-29 DIAGNOSIS — K5730 Diverticulosis of large intestine without perforation or abscess without bleeding: Secondary | ICD-10-CM

## 2010-11-29 DIAGNOSIS — D126 Benign neoplasm of colon, unspecified: Secondary | ICD-10-CM

## 2010-11-29 DIAGNOSIS — Z1211 Encounter for screening for malignant neoplasm of colon: Secondary | ICD-10-CM

## 2010-11-29 MED ORDER — SODIUM CHLORIDE 0.9 % IV SOLN: 500.0000 mL | INTRAVENOUS | Status: DC

## 2010-11-29 NOTE — Patient Instructions (Signed)
Follow discharge instructions.  Resume your medications.  High Fiber Diet with Liberal fluid intake.  Await pathology results.

## 2010-11-30 ENCOUNTER — Telehealth: Payer: Self-pay

## 2010-11-30 NOTE — Telephone Encounter (Signed)
No ID on answering machine. 

## 2010-12-06 ENCOUNTER — Encounter: Payer: Self-pay | Admitting: Gastroenterology

## 2010-12-07 ENCOUNTER — Other Ambulatory Visit: Payer: Self-pay | Admitting: Internal Medicine

## 2011-01-09 ENCOUNTER — Other Ambulatory Visit: Payer: Self-pay | Admitting: Internal Medicine

## 2011-02-12 ENCOUNTER — Other Ambulatory Visit: Payer: Self-pay | Admitting: Internal Medicine

## 2011-03-14 ENCOUNTER — Other Ambulatory Visit: Payer: Self-pay | Admitting: Internal Medicine

## 2011-04-11 ENCOUNTER — Other Ambulatory Visit: Payer: Self-pay | Admitting: Internal Medicine

## 2011-05-16 ENCOUNTER — Other Ambulatory Visit: Payer: Self-pay | Admitting: Internal Medicine

## 2011-05-22 ENCOUNTER — Other Ambulatory Visit: Payer: Self-pay | Admitting: *Deleted

## 2011-05-22 MED ORDER — DILTIAZEM HCL 120 MG PO TABS: 120.0000 mg | ORAL_TABLET | Freq: Every day | ORAL | Status: DC

## 2011-05-22 MED ORDER — LISINOPRIL-HYDROCHLOROTHIAZIDE 20-12.5 MG PO TABS: 1.0000 | ORAL_TABLET | Freq: Every day | ORAL | Status: DC

## 2011-06-21 ENCOUNTER — Telehealth: Payer: Self-pay | Admitting: *Deleted

## 2011-06-21 MED ORDER — SIMVASTATIN 80 MG PO TABS: 80.0000 mg | ORAL_TABLET | Freq: Every day | ORAL | Status: DC

## 2011-06-21 MED ORDER — LISINOPRIL-HYDROCHLOROTHIAZIDE 20-12.5 MG PO TABS: 1.0000 | ORAL_TABLET | Freq: Every day | ORAL | Status: DC

## 2011-06-21 MED ORDER — METOPROLOL TARTRATE 50 MG PO TABS: 50.0000 mg | ORAL_TABLET | Freq: Two times a day (BID) | ORAL | Status: DC

## 2011-06-21 MED ORDER — DILTIAZEM HCL 120 MG PO TABS: 120.0000 mg | ORAL_TABLET | Freq: Every day | ORAL | Status: DC

## 2011-06-21 MED ORDER — MELOXICAM 15 MG PO TABS: 15.0000 mg | ORAL_TABLET | Freq: Every day | ORAL | Status: DC

## 2011-06-21 NOTE — Telephone Encounter (Signed)
Left msg with pt notifying her that she is not allowed anymore refills on meds until an OV is scheduled.

## 2011-06-21 NOTE — Telephone Encounter (Signed)
OK to refill meds per MD. OV scheduled for 2.25.13

## 2011-07-22 ENCOUNTER — Other Ambulatory Visit: Payer: Self-pay

## 2011-07-22 MED ORDER — SIMVASTATIN 80 MG PO TABS: 80.0000 mg | ORAL_TABLET | Freq: Every day | ORAL | Status: DC

## 2011-07-22 MED ORDER — DILTIAZEM HCL 120 MG PO TABS: 120.0000 mg | ORAL_TABLET | Freq: Every day | ORAL | Status: DC

## 2011-07-22 MED ORDER — METOPROLOL TARTRATE 50 MG PO TABS: 50.0000 mg | ORAL_TABLET | Freq: Two times a day (BID) | ORAL | Status: DC

## 2011-07-22 MED ORDER — MELOXICAM 15 MG PO TABS: 15.0000 mg | ORAL_TABLET | Freq: Every day | ORAL | Status: DC

## 2011-07-22 MED ORDER — LISINOPRIL-HYDROCHLOROTHIAZIDE 20-12.5 MG PO TABS: 1.0000 | ORAL_TABLET | Freq: Every day | ORAL | Status: DC

## 2011-07-22 NOTE — Telephone Encounter (Signed)
Pt called requesting 1 more refill to last until upcoming appt

## 2011-07-24 ENCOUNTER — Other Ambulatory Visit: Payer: Self-pay | Admitting: *Deleted

## 2011-07-24 MED ORDER — METOPROLOL TARTRATE 50 MG PO TABS: ORAL_TABLET | ORAL | Status: DC

## 2011-07-24 NOTE — Progress Notes (Signed)
Correction on Sig:

## 2011-07-29 ENCOUNTER — Ambulatory Visit (INDEPENDENT_AMBULATORY_CARE_PROVIDER_SITE_OTHER): Payer: Medicare Other | Admitting: Internal Medicine

## 2011-07-29 ENCOUNTER — Encounter: Payer: Self-pay | Admitting: Internal Medicine

## 2011-07-29 ENCOUNTER — Other Ambulatory Visit (INDEPENDENT_AMBULATORY_CARE_PROVIDER_SITE_OTHER): Payer: Medicare Other

## 2011-07-29 VITALS — BP 128/72 | HR 52 | Temp 96.9°F | Resp 16 | Ht 63.0 in | Wt 155.0 lb

## 2011-07-29 DIAGNOSIS — E785 Hyperlipidemia, unspecified: Secondary | ICD-10-CM

## 2011-07-29 DIAGNOSIS — F17200 Nicotine dependence, unspecified, uncomplicated: Secondary | ICD-10-CM

## 2011-07-29 DIAGNOSIS — Z86010 Personal history of colonic polyps: Secondary | ICD-10-CM

## 2011-07-29 DIAGNOSIS — Z0000 Encounter for general adult medical examination without abnormal findings: Secondary | ICD-10-CM

## 2011-07-29 DIAGNOSIS — I10 Essential (primary) hypertension: Secondary | ICD-10-CM

## 2011-07-29 DIAGNOSIS — Z1239 Encounter for other screening for malignant neoplasm of breast: Secondary | ICD-10-CM

## 2011-07-29 DIAGNOSIS — I739 Peripheral vascular disease, unspecified: Secondary | ICD-10-CM

## 2011-07-29 DIAGNOSIS — I2510 Atherosclerotic heart disease of native coronary artery without angina pectoris: Secondary | ICD-10-CM

## 2011-07-29 DIAGNOSIS — Z8679 Personal history of other diseases of the circulatory system: Secondary | ICD-10-CM

## 2011-07-29 LAB — LIPID PANEL
HDL: 40.4 mg/dL (ref >39.00)
Triglycerides: 170 mg/dL — ABNORMAL HIGH (ref 0.0–149.0)
VLDL: 34 mg/dL (ref 0.0–40.0)

## 2011-07-29 LAB — HEPATIC FUNCTION PANEL
ALT: 19 U/L (ref 0–35)
Albumin: 3.6 g/dL (ref 3.5–5.2)
Total Bilirubin: 0.5 mg/dL (ref 0.3–1.2)

## 2011-07-29 LAB — COMPREHENSIVE METABOLIC PANEL
ALT: 19 U/L (ref 0–35)
AST: 21 U/L (ref 0–37)
Alkaline Phosphatase: 45 U/L (ref 39–117)
CO2: 30 mEq/L (ref 19–32)
Creatinine, Ser: 0.8 mg/dL (ref 0.4–1.2)
GFR: 74.16 mL/min (ref >60.00)
Sodium: 142 mEq/L (ref 135–145)
Total Bilirubin: 0.5 mg/dL (ref 0.3–1.2)
Total Protein: 6.3 g/dL (ref 6.0–8.3)

## 2011-07-29 LAB — TSH: TSH: 6.19 u[IU]/mL — ABNORMAL HIGH (ref 0.35–5.50)

## 2011-07-29 MED ORDER — DILTIAZEM HCL 120 MG PO TABS: 120.0000 mg | ORAL_TABLET | Freq: Every day | ORAL | Status: DC

## 2011-07-29 MED ORDER — SIMVASTATIN 80 MG PO TABS: 80.0000 mg | ORAL_TABLET | Freq: Every day | ORAL | Status: DC

## 2011-07-29 MED ORDER — METOPROLOL TARTRATE 50 MG PO TABS: ORAL_TABLET | ORAL | Status: DC

## 2011-07-29 MED ORDER — MELOXICAM 15 MG PO TABS: 15.0000 mg | ORAL_TABLET | Freq: Every day | ORAL | Status: DC

## 2011-07-29 MED ORDER — LISINOPRIL-HYDROCHLOROTHIAZIDE 20-12.5 MG PO TABS: 1.0000 | ORAL_TABLET | Freq: Every day | ORAL | Status: DC

## 2011-07-29 NOTE — Patient Instructions (Signed)
Sinus congestion - no sign of continued infection. It is ok to use sudafed (generic) 30 mg two or three times a day for congestion.  Normal exam today. Letter to follow

## 2011-07-29 NOTE — Progress Notes (Signed)
Subjective:    Patient ID: Tammy Brooks, female    DOB: 09-Oct-1933, 78 y.o.   MRN: [id number]  HPI The patient is here for annual Medicare wellness examination and management of other chronic and acute problems.   Interval she had a sinus infection. She was seen at an Urgent care center - given Amoxicillin and cough syrup. She still has some pressure in her sinuses. She has seen Dr. Allyson Sabal - had diagnositic 2 D echo and carotid dopplers with no change is obstruction.   The risk factors are reflected in the social history.  The roster of all physicians providing medical care to patient - is listed in the Snapshot section of the chart.  Activities of daily living:  The patient is 100% inedpendent in all ADLs: dressing, toileting, feeding as well as dependent mobility -  Doesn't drive due to blindness left eye.   Home safety : The patient has smoke detectors in the home. Home is fall safe. No grab bars in the bathroom. They wear seatbelts.  Firearms are present in the home, kept in a safe fashion. There is no violence in the home.   There is no risks for hepatitis, STDs or HIV. There is no history of blood transfusion. They have no travel history to infectious disease endemic areas of the world.  The patient has not seen their dentist in the last six month -  Full dentures. They have seen their eye doctor in the last year. They deny any hearing difficulty and have not had audiologic testing in the last year.  They do not  have excessive sun exposure. Discussed the need for sun protection: hats, long sleeves and use of sunscreen if there is significant sun exposure.   Diet: the importance of a healthy diet is discussed. They do have a healthy (unhealthy-high fat/fast food) diet.  The patient has no regular exercise program.  The benefits of regular aerobic exercise were discussed.  Depression screen: there are no signs or vegative symptoms of depression- irritability, change in appetite,  anhedonia, sadness/tearfullness.  Cognitive assessment: the patient manages all their financial and personal affairs and is actively engaged.   The following portions of the patient's history were reviewed and updated as appropriate: allergies, current medications, past family history, past medical history,  past surgical history, past social history  and problem list.  Vision, hearing, body mass index were assessed and reviewed.   During the course of the visit the patient was educated and counseled about appropriate screening and preventive services including : fall prevention , diabetes screening, nutrition counseling, colorectal cancer screening, and recommended immunizations.  Past Medical History  Diagnosis Date  . Arthritis   . Blind left eye   . Heart murmur   . Hyperlipidemia   . Hypertension   . Myocardial infarction     mild/ age 76  . Stroke 2001    no paralysis  . Ulcer    Past Surgical History  Procedure Date  . Cataract extraction w/ intraocular lens  implant, bilateral   . Eye surgery     lense implant removed/ blind in left eye  . Upper gastrointestinal endoscopy     treated for h-pylori  . Colonoscopy   . Polypectomy   . Tubal ligation   . Appendectomy   . Tonsillectomy and adenoidectomy    Family History  Problem Relation Age of Onset  . Aneurysm Mother     AAA  . Hypertension Mother   . Heart  disease Father     CAD, MI x 3  . Cancer Maternal Aunt     breast cancer   History   Social History  . Marital Status: Widowed    Spouse Name: N/A    Number of Children: 5  . Years of Education: 9   Occupational History  . Psychiatric nurse     retired   Social History Main Topics  . Smoking status: Current Everyday Smoker -- 1.0 packs/day    Types: Cigarettes  . Smokeless tobacco: Never Used  . Alcohol Use: 1.0 oz/week    2 drink(s) per week  . Drug Use: No  . Sexually Active: Not Currently   Other Topics Concern  . Not on file   Social  History Narrative   9th grade. married '54- widowed Dec 10th'08. 4 sons - '55, '58, '60, '69; 1 daughter - '63; 12 grandchildren; 9 great-grandchildren. 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 Other children are close and attentive. ACP/End-of-life: No CPR, no mechanical ventilation, no futile/heroic measures.      Review of Systems Constitutional:  Negative for fever, chills, activity change and unexpected weight change.  HEENT:  Negative for hearing loss, ear pain, congestion, neck stiffness and postnasal drip. Negative for sore throat or swallowing problems. Negative for dental complaints.   Eyes: Positive for vision loss left eye with no other change in visual acuity.  Respiratory: Negative for chest tightness and wheezing. Negative for DOE.   Cardiovascular: Negative for chest pain or palpitations.  decreased exercise tolerance Gastrointestinal: No change in bowel habit. No bloating or gas. No reflux or indigestion Genitourinary: Negative for urgency, frequency, flank pain and difficulty urinating.  Musculoskeletal: Negative for myalgias, back pain, arthralgias and gait problem.  Neurological: Negative for dizziness, tremors, weakness and headaches.  Hematological: Negative for adenopathy.  Psychiatric/Behavioral: Negative for behavioral problems and dysphoric mood.   Current Outpatient Prescriptions on File Prior to Visit  Medication Sig Dispense Refill  . aspirin 81 MG tablet Take 81 mg by mouth daily.        . dorzolamide-timolol (COSOPT) 22.3-6.8 MG/ML ophthalmic solution Place 1 drop into the left eye daily.              Metoprolol 50 mg daily       Lisinopril/hctz 20/12.5  Daily       Diltiazem CD 120 mg Daily       Simvastatin 80 mg  Daily       Meloxicam 15 mg daily                             Objective:   Physical Exam  Filed Vitals:   07/29/11 0908  BP: 128/72  Pulse: 52  Temp: 96.9 F (36.1 C)  Resp: 16    Gen'l: well nourished, well developed white woman in no distress HEENT - [place]/AT, EACs/TMs normal, oropharynx with full dentures, no buccal lesions, posterior pharynx clear, mucous membranes moist. C&S clear, PERRLA, fundi - normal Neck - supple, no thyromegaly Nodes- negative submental, cervical, supraclavicular regions Chest - no deformity, no CVAT Lungs - cleat without rales, wheezes. No increased work of breathing Breast - skin normal, nipples w/o discharge, no fixed mass or lesion, no axillary adenopathy Cardiovascular - regular rate and rhythm, quiet precordium, no murmurs, rubs or gallops, 2+ radial, DP and PT pulses Abdomen - BS+ x 4, no HSM,  no guarding or rebound or tenderness Pelvic - deferred s/po hysterectomy Rectal - deferred  Extremities - no clubbing, cyanosis, edema or deformity.  Neuro - A&O x 3, CN II-XII normal, motor strength normal and equal, DTRs 2+ and symmetrical biceps, radial, and patellar tendons. Cerebellar - no tremor, no rigidity, fluid movement and normal gait. Derm - Head, neck, back, abdomen and extremities without suspicious lesions  Lab Results  Component Value Date   WBC 7.2 11/21/2009   HGB 13.7 11/21/2009   HCT 40.7 11/21/2009   PLT 191.0 11/21/2009   GLUCOSE 111* 07/29/2011   CHOL 123 07/29/2011   TRIG 170.0* 07/29/2011   HDL 40.40 07/29/2011   LDLDIRECT 76.7 11/17/2008   LDLCALC 49 07/29/2011        ALT 19 07/29/2011   AST 21 07/29/2011        NA 142 07/29/2011   K 3.9 07/29/2011   CL 108 07/29/2011   CREATININE 0.8 07/29/2011   BUN 20 07/29/2011   CO2 30 07/29/2011   TSH 6.19* 07/29/2011         Assessment & Plan:

## 2011-07-30 ENCOUNTER — Encounter: Payer: Self-pay | Admitting: Internal Medicine

## 2011-07-30 NOTE — Assessment & Plan Note (Signed)
Stable. No recurrent problems. No sequelae.  Plan - continue risk reduction

## 2011-07-30 NOTE — Assessment & Plan Note (Signed)
BP Readings from Last 3 Encounters:  07/29/11 128/72  11/29/10 148/75  11/21/09 112/58   Adequate control of hypertension on existing medications. No change proposed

## 2011-07-30 NOTE — Assessment & Plan Note (Signed)
Excellent lab results with LDL much better than goal of 100 or less. She has been on a CCB and high dose simvastatin for more than a year w/o any side effects or complications. FDA warnings are known but in accord with FDA recommendations since she has had no problems will make no change in medication.

## 2011-07-30 NOTE — Assessment & Plan Note (Signed)
Current with Dr. Allyson Sabal at [facility] - she reports she just had 2 D echo and carotid doppler - results pending. She has been asymptomatic.   Plan - continue risk reduction.

## 2011-07-30 NOTE — Assessment & Plan Note (Signed)
Carotid studies not available but by her report she has not had progressive disease. NO carotid bruit on exam.  Plan - continued risk reduction.

## 2011-07-30 NOTE — Assessment & Plan Note (Signed)
Patient continues to smoke. She has been counseled to quit. She is pre-contemplative and not interested in quitting at this time.

## 2011-07-30 NOTE — Assessment & Plan Note (Signed)
Last study June '12 - small tubular adenoma removed.  Plan - follow-up per GI

## 2011-07-30 NOTE — Assessment & Plan Note (Signed)
Interval medical history is benign. Physical exam, sans pelvic, is normal. Lab results are good. Current with colonoscopy. Last mammogram '00, normal exam today - further mammograms are optional (USPHTF). Immunizations are up to date.  In summary - a nice woman who is medically stable. She is followed closely by Dr. Allyson Sabal in regard to vascular disease. She is adamantly encouraged to stop smoking. She will return as needed or in 1 year.

## 2011-08-19 ENCOUNTER — Encounter: Payer: Self-pay | Admitting: Internal Medicine

## 2011-09-19 ENCOUNTER — Other Ambulatory Visit: Payer: Self-pay

## 2011-09-19 MED ORDER — LISINOPRIL-HYDROCHLOROTHIAZIDE 20-12.5 MG PO TABS: 1.0000 | ORAL_TABLET | Freq: Every day | ORAL | Status: DC

## 2012-03-03 DIAGNOSIS — R931 Abnormal findings on diagnostic imaging of heart and coronary circulation: Secondary | ICD-10-CM

## 2012-03-03 HISTORY — DX: Abnormal findings on diagnostic imaging of heart and coronary circulation: R93.1

## 2012-03-14 ENCOUNTER — Encounter (HOSPITAL_COMMUNITY): Payer: Self-pay | Admitting: *Deleted

## 2012-03-14 ENCOUNTER — Observation Stay (HOSPITAL_COMMUNITY)
Admission: EM | Admit: 2012-03-14 | Discharge: 2012-03-15 | Disposition: A | Discharge status: Home / Self Care | Payer: Medicare Other | Source: Other Acute Inpatient Hospital | Attending: Internal Medicine | Admitting: Internal Medicine

## 2012-03-14 DIAGNOSIS — R079 Chest pain, unspecified: Principal | ICD-10-CM

## 2012-03-14 DIAGNOSIS — E785 Hyperlipidemia, unspecified: Secondary | ICD-10-CM | POA: Diagnosis present

## 2012-03-14 DIAGNOSIS — M109 Gout, unspecified: Secondary | ICD-10-CM | POA: Insufficient documentation

## 2012-03-14 DIAGNOSIS — I10 Essential (primary) hypertension: Secondary | ICD-10-CM

## 2012-03-14 DIAGNOSIS — J449 Chronic obstructive pulmonary disease, unspecified: Secondary | ICD-10-CM | POA: Insufficient documentation

## 2012-03-14 DIAGNOSIS — R918 Other nonspecific abnormal finding of lung field: Secondary | ICD-10-CM

## 2012-03-14 DIAGNOSIS — E860 Dehydration: Secondary | ICD-10-CM

## 2012-03-14 DIAGNOSIS — I2510 Atherosclerotic heart disease of native coronary artery without angina pectoris: Secondary | ICD-10-CM

## 2012-03-14 DIAGNOSIS — I350 Nonrheumatic aortic (valve) stenosis: Secondary | ICD-10-CM

## 2012-03-14 DIAGNOSIS — J4489 Other specified chronic obstructive pulmonary disease: Secondary | ICD-10-CM | POA: Insufficient documentation

## 2012-03-14 DIAGNOSIS — R0789 Other chest pain: Secondary | ICD-10-CM | POA: Diagnosis present

## 2012-03-14 LAB — CBC
Hemoglobin: 13.8 g/dL (ref 12.0–15.0)
MCHC: 33 g/dL (ref 30.0–36.0)

## 2012-03-14 MED ORDER — ALBUTEROL SULFATE (5 MG/ML) 0.5% IN NEBU: 2.5000 mg | INHALATION_SOLUTION | RESPIRATORY_TRACT | Status: DC | PRN

## 2012-03-14 MED ORDER — ALUM & MAG HYDROXIDE-SIMETH 200-200-20 MG/5ML PO SUSP: 30.0000 mL | Freq: Four times a day (QID) | ORAL | Status: DC | PRN

## 2012-03-14 MED ORDER — HYDROCODONE-ACETAMINOPHEN 5-325 MG PO TABS: 1.0000 | ORAL_TABLET | ORAL | Status: DC | PRN

## 2012-03-14 MED ORDER — ONDANSETRON HCL 4 MG/2ML IJ SOLN: 4.0000 mg | Freq: Four times a day (QID) | INTRAMUSCULAR | Status: DC | PRN

## 2012-03-14 MED ORDER — ACETAMINOPHEN 325 MG PO TABS: 650.0000 mg | ORAL_TABLET | Freq: Four times a day (QID) | ORAL | Status: DC | PRN

## 2012-03-14 MED ORDER — ENOXAPARIN SODIUM 40 MG/0.4ML ~~LOC~~ SOLN
40.0000 mg | SUBCUTANEOUS | Status: DC
  Administered 2012-03-15: 40 mg via SUBCUTANEOUS
  Filled 2012-03-14 (×2): qty 0.4

## 2012-03-14 MED ORDER — DOCUSATE SODIUM 100 MG PO CAPS
100.0000 mg | ORAL_CAPSULE | Freq: Two times a day (BID) | ORAL | Status: DC
  Administered 2012-03-15: 100 mg via ORAL
  Filled 2012-03-14 (×2): qty 1

## 2012-03-14 MED ORDER — SODIUM CHLORIDE 0.9 % IJ SOLN: 3.0000 mL | Freq: Two times a day (BID) | INTRAMUSCULAR | Status: DC

## 2012-03-14 MED ORDER — ASPIRIN EC 81 MG PO TBEC
81.0000 mg | DELAYED_RELEASE_TABLET | Freq: Every day | ORAL | Status: DC
  Administered 2012-03-15: 81 mg via ORAL
  Filled 2012-03-14: qty 1

## 2012-03-14 MED ORDER — ACETAMINOPHEN 650 MG RE SUPP: 650.0000 mg | Freq: Four times a day (QID) | RECTAL | Status: DC | PRN

## 2012-03-14 MED ORDER — SODIUM CHLORIDE 0.9 % IJ SOLN: 3.0000 mL | INTRAMUSCULAR | Status: DC | PRN

## 2012-03-14 MED ORDER — GUAIFENESIN-DM 100-10 MG/5ML PO SYRP: 5.0000 mL | ORAL_SOLUTION | ORAL | Status: DC | PRN

## 2012-03-14 MED ORDER — SODIUM CHLORIDE 0.9 % IV SOLN: 250.0000 mL | INTRAVENOUS | Status: DC | PRN

## 2012-03-14 MED ORDER — ONDANSETRON HCL 4 MG PO TABS: 4.0000 mg | ORAL_TABLET | Freq: Four times a day (QID) | ORAL | Status: DC | PRN

## 2012-03-14 MED ORDER — MORPHINE SULFATE 2 MG/ML IJ SOLN: 2.0000 mg | INTRAMUSCULAR | Status: DC | PRN

## 2012-03-14 MED ORDER — SODIUM CHLORIDE 0.9 % IJ SOLN
3.0000 mL | Freq: Two times a day (BID) | INTRAMUSCULAR | Status: DC
  Administered 2012-03-15: 3 mL via INTRAVENOUS

## 2012-03-15 ENCOUNTER — Encounter (HOSPITAL_COMMUNITY): Payer: Self-pay | Admitting: Internal Medicine

## 2012-03-15 DIAGNOSIS — I10 Essential (primary) hypertension: Secondary | ICD-10-CM

## 2012-03-15 DIAGNOSIS — E860 Dehydration: Secondary | ICD-10-CM | POA: Diagnosis present

## 2012-03-15 DIAGNOSIS — R918 Other nonspecific abnormal finding of lung field: Secondary | ICD-10-CM | POA: Diagnosis present

## 2012-03-15 DIAGNOSIS — I359 Nonrheumatic aortic valve disorder, unspecified: Secondary | ICD-10-CM

## 2012-03-15 DIAGNOSIS — I2510 Atherosclerotic heart disease of native coronary artery without angina pectoris: Secondary | ICD-10-CM

## 2012-03-15 DIAGNOSIS — R079 Chest pain, unspecified: Principal | ICD-10-CM

## 2012-03-15 LAB — TROPONIN I
Troponin I: <0.3 ng/mL (ref <0.30)
Troponin I: <0.3 ng/mL (ref <0.30)

## 2012-03-15 LAB — COMPREHENSIVE METABOLIC PANEL
Albumin: 2.9 g/dL — ABNORMAL LOW (ref 3.5–5.2)
Alkaline Phosphatase: 45 U/L (ref 39–117)
BUN: 22 mg/dL (ref 6–23)
CO2: 28 mEq/L (ref 19–32)
Chloride: 104 mEq/L (ref 96–112)
GFR calc Af Amer: 56 mL/min — ABNORMAL LOW (ref >90)
GFR calc non Af Amer: 49 mL/min — ABNORMAL LOW (ref >90)
Glucose, Bld: 117 mg/dL — ABNORMAL HIGH (ref 70–99)
Potassium: 3.6 mEq/L (ref 3.5–5.1)
Total Bilirubin: 0.3 mg/dL (ref 0.3–1.2)

## 2012-03-15 LAB — CBC
MCV: 89.6 fL (ref 78.0–100.0)
Platelets: 233 10*3/uL (ref 150–400)
RDW: 13.5 % (ref 11.5–15.5)
WBC: 12 10*3/uL — ABNORMAL HIGH (ref 4.0–10.5)

## 2012-03-15 LAB — CREATININE, SERUM
Creatinine, Ser: 1.21 mg/dL — ABNORMAL HIGH (ref 0.50–1.10)
GFR calc non Af Amer: 42 mL/min — ABNORMAL LOW (ref >90)

## 2012-03-15 MED ORDER — DORZOLAMIDE HCL-TIMOLOL MAL 2-0.5 % OP SOLN
1.0000 [drp] | Freq: Every day | OPHTHALMIC | Status: DC
  Administered 2012-03-15: 1 [drp] via OPHTHALMIC
  Filled 2012-03-15: qty 10

## 2012-03-15 MED ORDER — ATORVASTATIN CALCIUM 40 MG PO TABS
40.0000 mg | ORAL_TABLET | Freq: Every day | ORAL | Status: DC
  Filled 2012-03-15: qty 1

## 2012-03-15 MED ORDER — METOPROLOL TARTRATE 12.5 MG HALF TABLET
12.5000 mg | ORAL_TABLET | Freq: Two times a day (BID) | ORAL | Status: DC
  Administered 2012-03-15 (×2): 12.5 mg via ORAL
  Filled 2012-03-15 (×3): qty 1

## 2012-03-15 MED ORDER — ASPIRIN 81 MG PO TABS: 81.0000 mg | ORAL_TABLET | Freq: Every day | ORAL | Status: DC

## 2012-03-15 MED ORDER — DILTIAZEM HCL 60 MG PO TABS
120.0000 mg | ORAL_TABLET | Freq: Every day | ORAL | Status: DC
  Administered 2012-03-15: 120 mg via ORAL
  Filled 2012-03-15: qty 2

## 2012-03-15 MED ORDER — SODIUM CHLORIDE 0.9 % IV SOLN
250.0000 mL | INTRAVENOUS | Status: AC
  Administered 2012-03-15: 750 mL via INTRAVENOUS

## 2012-03-15 MED ORDER — MELOXICAM 15 MG PO TABS: 15.0000 mg | ORAL_TABLET | Freq: Every day | ORAL | Status: DC

## 2012-03-15 NOTE — Discharge Summary (Signed)
NAMECHANLER, SCHREITER                  ACCOUNT NO.:  [id number]  MEDICAL RECORD NO.:  [id number]  LOCATION:  2002                         FACILITY:  [facility]  PHYSICIAN:  Rosalyn Gess. Azar South, MD  DATE OF BIRTH:  [birth year]  DATE OF ADMISSION:  03/14/2012 DATE OF DISCHARGE:  03/15/2012                              DISCHARGE SUMMARY   DIAGNOSES: 1. Chest pain, rule out myocardial infarction. 2. Gout.  DISCHARGE DIAGNOSES: 1. Myocardial infarction ruled out with negative cardiac enzymes 2. Gout stable with pain resolved.  CONSULTANTS:  None.  PROCEDURES:  None at this hospital.  At [facility] prior to admission, the patient had a CT angio which was negative for PE, positive for COPD, nonspecific nodules, no actual report available.  HISTORY OF PRESENT ILLNESS:  Tammy Brooks is a 78 year old woman who is followed for general medical care by Dr. Debby Bud of [place] and followed for heart disease by Dr. Nanetta Batty at [facility] and vascular.  She does have a history of hyperlipidemia, hypertension, and MI at age 64.  The patient reports she was in her usual state of health until Thursday of last week when she developed acute and severe pain in her left foot and distal lower extremity.  She went to [facility] for evaluation, was diagnosed having gout and evidently was given prednisone and pain medication for this.  She reports that she started to feel ill but never had any frank chest pain or chest discomfort, shortness of breath.  She went back to [facility] where evaluation in the ED revealed an elevated troponin.  CT angio was performed and ruled her out for PE.  Because of elevated troponin, she was transferred to [facility] for further cardiac evaluation.  The patient did have some lightheadedness and felt syncopal but never had any true syncope.  She denied any vertigo attacks.  She had slurred speech, focal weakness.  She  never did manifest any evidence of chest pain or chest discomfort.  Please see the H and P for past medical history, family history, social history, and admission examination as well as other documentation available in epic.  HOSPITAL COURSE: 1. Cardiovascular.  The patient was admitted to a telemetry unit where     she remained in sinus rhythm.  Cardiac enzymes were cycled with     negative troponin I x2.  General chemistries were unremarkable with     normal renal function.  Blood sugar was mildly elevated at 117.     Liver functions were normal.  Albumin was slightly low.  She     remained chest pain free.  With the patient having a normal EKG at     admission with normal telemetry with normal cardiac enzymes at this     point she is ruled out and is stable to discharge to home.  The     patient does have a followup appointment with Dr. Nanetta Batty at     [facility] and Vascular scheduled for Wednesday, October     15th in the a.m.  Dr. Allyson Sabal will determine if she needs any     additional  risk stratification as an outpatient. 2. Gout.  The patient has had no recurrent pain or discomfort.  On     examination, her foot and leg appeared normal.  PLAN:  The patient does not need any active medications at this point. We will add a uric acid level to blood in the lab to see if she is a candidate for prophylactic medication.  The patient is otherwise doing well and stable.  She will continue all of her home medications without change.  She does have a followup appointment scheduled with Internal Medicine on October 15 in the p.m. and she will keep this appointment.  The patient's condition at time of discharge dictation is medically stable.     Rosalyn Gess Rainier Feuerborn, MD     MEN/MEDQ  D:  03/15/2012  T:  03/15/2012  Job:  [id number]  cc:   Nanetta Batty, M.D.

## 2012-03-15 NOTE — Progress Notes (Signed)
  Echocardiogram 2D Echocardiogram has been performed.  Tammy Brooks 03/15/2012, 11:40 AM

## 2012-03-15 NOTE — Progress Notes (Signed)
Patient admitted due to near-syncope and to r/o MI having had abnormal troponin I at outside hospital. She has done well with negative cardiac enzymes, unremarkable EKG and no chest pain. She is for d/c home. She will see Dr. Allyson Sabal at [facility] who will determine appropriate risk stratification testing. She will see Dr. Debby Bud, October 16th for follow up of gen'l medicine and gout.   She is stable and ready for d/c. Dictated # [id number].

## 2012-03-15 NOTE — H&P (Signed)
PCP:   Illene Regulus, MD  Cardiology: Dr. Gery Pray [facility]  Chief Complaint:   Chest pressure  HPI: Tammy Brooks is a 78 y.o. female   has a past medical history of Arthritis; Blind left eye; Heart murmur; Hyperlipidemia; Hypertension; Myocardial infarction; Stroke (2001); and Ulcer.   Presented with  For the past 3 day she have had decreased energy she have had shallow breathing. She felt light headed. No syncope. No confusion. Denies any fever or chills. NO chest pain but she have had some discomfort. She has been eating and drinking well. SHe presented to [facility] and she had borderline positive troponin so she was transfered to [facility]. At [facility] troponin has been negative. No changes to ECG. She has been feeling much better. She describes her dizziness as presyncope. Denies vertigo. No ataxia. No slurred speech. SHe have had LEft leg swelling some weeks ago that thought to be due to gout and have resolved with prednisone. Denies traveling.  She have had a negative CT angio of her chest at [place] that showed some aortic calcification and pulmonary nodules  Review of Systems:     Pertinent positives include: nausea, shortness of breath at rest.  Constitutional:  No weight loss, night sweats, Fevers, chills, fatigue, weight loss  HEENT:  No headaches, Difficulty swallowing,Tooth/dental problems,Sore throat,  No sneezing, itching, ear ache, nasal congestion, post nasal drip,  Cardio-vascular:  No chest pain, Orthopnea, PND, anasarca, dizziness, palpitations.no Bilateral lower extremity swelling  GI:  No heartburn, indigestion, abdominal pain, vomiting, diarrhea, change in bowel habits, loss of appetite, melena, blood in stool, hematemesis Resp:  no  No dyspnea on exertion, No excess mucus, no productive cough, No non-productive cough, No coughing up of blood.No change in color of mucus.No wheezing. Skin:  no rash or lesions. No jaundice GU:  no dysuria, change in color of urine, no  urgency or frequency. No straining to urinate.  No flank pain.  Musculoskeletal:  No joint pain or no joint swelling. No decreased range of motion. No back pain.  Psych:  No change in mood or affect. No depression or anxiety. No memory loss.  Neuro: no localizing neurological complaints, no tingling, no weakness, no double vision, no gait abnormality, no slurred speech, no confusion  Otherwise ROS are negative except for above, 10 systems were reviewed  Past Medical History: Past Medical History  Diagnosis Date  . Arthritis   . Blind left eye   . Heart murmur   . Hyperlipidemia   . Hypertension   . Myocardial infarction     mild/ age 59  . Stroke 2001    no paralysis  . Ulcer    Past Surgical History  Procedure Date  . Cataract extraction w/ intraocular lens  implant, bilateral   . Eye surgery     lense implant removed/ blind in left eye  . Upper gastrointestinal endoscopy     treated for h-pylori  . Colonoscopy   . Polypectomy   . Tubal ligation   . Appendectomy   . Tonsillectomy and adenoidectomy   . Abdominal hysterectomy      Medications: Prior to Admission medications   Medication Sig Start Date End Date Taking? Authorizing Provider  aspirin 81 MG tablet Take 81 mg by mouth daily.      Historical Provider, MD  diltiazem (CARDIZEM) 120 MG tablet Take 1 tablet (120 mg total) by mouth daily. 07/29/11   Jacques Navy, MD  dorzolamide-timolol (COSOPT) 22.3-6.8 MG/ML ophthalmic solution  Place 1 drop into the left eye daily.      Historical Provider, MD  lisinopril-hydrochlorothiazide (PRINZIDE,ZESTORETIC) 20-12.5 MG per tablet Take 1 tablet by mouth daily. 09/19/11   Jacques Navy, MD  meloxicam (MOBIC) 15 MG tablet Take 1 tablet (15 mg total) by mouth daily. 07/29/11   Jacques Navy, MD  metoprolol (LOPRESSOR) 50 MG tablet Take 1/2 tablet by mouth twice daily. 07/29/11   Jacques Navy, MD  simvastatin (ZOCOR) 80 MG tablet Take 1 tablet (80 mg total) by mouth  at bedtime. 07/29/11   Jacques Navy, MD    Allergies:   Allergies  Allergen Reactions  . Nabumetone Other (See Comments)    Causes inflammation    Social History:  Ambulatory  Independently   Lives at home with son   reports that she has been smoking Cigarettes.  She has a 62 pack-year smoking history. She has never used smokeless tobacco. She reports that she drinks about one ounce of alcohol per week. She reports that she does not use illicit drugs.   Family History: family history includes Aneurysm in her mother; Cancer in her maternal aunt; Heart disease in her father; and Hypertension in her mother.    Physical Exam: Patient Vitals for the past 24 hrs:  BP Temp Temp src Pulse Resp SpO2 Height Weight  03/15/12 0000 - - - - - 96 % - -  03/14/12 2200 119/57 mmHg 98.3 F (36.8 C) Oral 63  18  98 % 5\' 4"  (1.626 m) 68.221 kg (150 lb 6.4 oz)    1. General:  in No Acute distress 2. Psychological: Alert and   Oriented 3. Head/ENT:    Dry Mucous Membranes                          Head Non traumatic, neck supple                          Normal  Dentition 4. SKIN:  decreased Skin turgor,  Skin clean Dry and intact no rash 5. Heart: Regular rate and rhythm systolic ejection murmur  Murmur, Rub or gallop 6. Lungs: Clear to auscultation bilaterally, no wheezes or crackles   7. Abdomen: Soft, non-tender, Non distended 8. Lower extremities: no clubbing, cyanosis, or edema 9. Neurologically strength 5 out of 5 in all 4 extremities cranial nerves II through XII intact  10. MSK: Normal range of motion  body mass index is 25.82 kg/(m^2).   Labs on Admission:   Basename 03/14/12 2316  NA --  K --  CL --  CO2 --  GLUCOSE --  BUN --  CREATININE 1.21*  CALCIUM --  MG --  PHOS --   No results found for this basename: AST:2,ALT:2,ALKPHOS:2,BILITOT:2,PROT:2,ALBUMIN:2 in the last 72 hours No results found for this basename: LIPASE:2,AMYLASE:2 in the last 72 hours  Basename  03/14/12 2316  WBC 11.7*  NEUTROABS --  HGB 13.8  HCT 41.8  MCV 89.7  PLT 241    Basename 03/14/12 2315  CKTOTAL --  CKMB --  CKMBINDEX --  TROPONINI <0.30   No results found for this basename: TSH,T4TOTAL,FREET3,T3FREE,THYROIDAB in the last 72 hours No results found for this basename: VITAMINB12:2,FOLATE:2,FERRITIN:2,TIBC:2,IRON:2,RETICCTPCT:2 in the last 72 hours No results found for this basename: HGBA1C    Estimated Creatinine Clearance: 37.5 ml/min (by C-G formula based on Cr of 1.21). ABG No results found for this basename:  phart, pco2, po2, hco3, tco2, acidbasedef, o2sat     No results found for this basename: DDIMER     Other results:  I have pearsonaly reviewed this: ECG REPORT  Rate: 67  Rhythm:  normal sinus rhythm ST&T Change: No ischemic changes  UA at other hospital was unremarkable  Cultures: No results found for this basename: sdes, specrequest, cult, reptstatus       Radiological Exams on Admission: No results found. CTA) to the hospital did show pulmonary nodules in no acute stenosis but no evidence of PE  Chart has been reviewed  Assessment/Plan  78 year old female who came in feeling unwell. She does appear to be slightly dehydrated. Chest discomfort initial troponin was borderline elevated but repeat troponin has been negative we'll continue to observe  Present on Admission:  .Chest pain - this was more of a vague discomfort not pain. currently resolved will continue to cycle cardiac markers. watch in telemetry  .HYPERTENSION - continue home medications  .CORONARY ARTERY DISEASE  [facility] cardiology is aware of patient continue her her home regimen and watch in telemetry at this point no evidence of ischemia .HYPERLIPIDEMIA - continue home regimen  .Dehydration - we'll give gentle IV fluids  .Pulmonary nodules - do something is to be followed up as an outpatient patient does have smoking history and continues to smoke currently    .Aortic stenosis - Would obtain echo gram    Prophylaxis:  Lovenox, Protonix  CODE STATUS: Limited code  Other plan as per orders.  I have spent a total of 60 min on this admission  Tammy Brooks 03/15/2012, 1:42 AM

## 2012-03-18 ENCOUNTER — Ambulatory Visit (INDEPENDENT_AMBULATORY_CARE_PROVIDER_SITE_OTHER): Payer: Medicare Other | Admitting: Internal Medicine

## 2012-03-18 ENCOUNTER — Encounter: Payer: Self-pay | Admitting: Internal Medicine

## 2012-03-18 VITALS — BP 130/68 | HR 64 | Temp 98.2°F | Resp 16 | Wt 152.0 lb

## 2012-03-18 DIAGNOSIS — I10 Essential (primary) hypertension: Secondary | ICD-10-CM

## 2012-03-18 DIAGNOSIS — Z23 Encounter for immunization: Secondary | ICD-10-CM | POA: Insufficient documentation

## 2012-03-18 DIAGNOSIS — F17200 Nicotine dependence, unspecified, uncomplicated: Secondary | ICD-10-CM

## 2012-03-18 DIAGNOSIS — Z860100 Personal history of colon polyps, unspecified: Secondary | ICD-10-CM

## 2012-03-18 DIAGNOSIS — I2510 Atherosclerotic heart disease of native coronary artery without angina pectoris: Secondary | ICD-10-CM

## 2012-03-18 NOTE — Patient Instructions (Addendum)
Heart - you had normal labs while at [facility] - no evidence of any heart damage. You had a 2D echo which revealed normal movement of the heart wall and normal pumping. Plan -  Keep your appointment with Dr. Allyson Sabal - he will work with you about any additional tests you may need.  GI - you had a tubular Adenoma on colonoscopy in June '12. Any blood in your stool is most likely hemorrhoidal. You DO NOT need any additional endoscopy or colonoscopy.  Gout - your Uric Acid was elevated at 9.5. You do not need medication at this time but you should avoid foods that may increase uric acid level and cause gout.   Purine Restricted Diet A low-purine diet consists of foods that reduce uric acid made in your body. INDICATIONS FOR USE   Your caregiver may ask you to follow a low-purine diet to reduce gout flairs.   GUIDELINES   Avoid high-purine foods, including all alcohol, yeast extracts taken as supplements, and sauces made from meats (like gravy). Do not eat high-purine meats, including anchovies, sardines, herring, mussels, tuna, codfish, scallops, trout, haddock, bacon, organ meats, tripe, goose, wild game, and sweetbreads.   Grains  Allowed/Recommended: All, except those listed to consume in moderation.   Consume in Moderation: Oatmeal ( cup uncooked daily), wheat bran or germ ( cup daily), and whole grains.  Vegetables  Allowed/Recommended: All, except those listed to consume in moderation.   Consume in Moderation: Asparagus, cauliflower, spinach, mushrooms, and green peas ( cup daily).  Fruit  Allowed/Recommended: All.   Consume in Moderation: None.  Meat and Meat Substitutes  Allowed/Recommended: Eggs, nuts, and peanut butter.   Consume in Moderation: Limit to 4 to 6 oz daily. Avoid high-purine meats. Lentils, peas, and dried beans (1 cup daily).  Milk  Allowed/Recommended: All. Choose low-fat or skim when possible.   Consume in Moderation: None.  Fats and Oils  Allowed/Recommended:  All.   Consume in Moderation: None.  Beverages  Allowed/Recommended: All, except those listed to avoid.   Avoid: All alcohol.  Condiments/Miscellaneous  Allowed/Recommended: All, except those listed to consume in moderation.   Consume in Moderation: Bouillon and meat-based broths and soups.  Document Released: 09/14/2010 Document Revised: 08/12/2011 Document Reviewed: 09/14/2010 [facility] Patient Information 2013 [place], [place].

## 2012-03-18 NOTE — Progress Notes (Signed)
Subjective:    Patient ID: Tammy Brooks, female    DOB: 1933/07/03, 78 y.o.   MRN: [id number]  HPI Mrs. Barrington was seen at the ED [facility] and had increased troponin leading to transport to [facility] for 24 hour observation to r/o MI. She did rule out and was discharged to home. Her 2 D echo was normal as were the cardiac enzymes. Her visit to Dr. Allyson Sabal was rescheduled for next week and at that time Dr. Allyson Sabal will determine what additional studies are indicated for risk stratification. She has not had any recurrent symptoms and is feeling much better.   IN the interval she has had colonoscopy by Dr. Russella Dar; she had EGD in [place], Dr. Charm Barges - negative study; she had carotid doppler Sept '13 - no change. She did have CT chest and abdomen at [facility] hospital that was normal, including r/o for PE. She did have a very painful left foot - was seen at [facility] and diagnosed with gout. When in [facility] October 13th  Uric Acid = 9.5  Past Medical History  Diagnosis Date  . Arthritis   . Blind left eye   . Heart murmur   . Hyperlipidemia   . Hypertension   . Myocardial infarction     mild/ age 17  . Stroke 2001    no paralysis  . Ulcer    Past Surgical History  Procedure Date  . Cataract extraction w/ intraocular lens  implant, bilateral   . Eye surgery     lense implant removed/ blind in left eye  . Upper gastrointestinal endoscopy     treated for h-pylori  . Colonoscopy   . Polypectomy   . Tubal ligation   . Appendectomy   . Tonsillectomy and adenoidectomy   . Abdominal hysterectomy    Family History  Problem Relation Age of Onset  . Aneurysm Mother     AAA  . Hypertension Mother   . Heart disease Father     CAD, MI x 3  . Cancer Maternal Aunt     breast cancer   History   Social History  . Marital Status: Widowed    Spouse Name: N/A    Number of Children: 5  . Years of Education: 9   Occupational History  . Psychiatric nurse     retired   Social History  Main Topics  . Smoking status: Current Every Day Smoker -- 1.0 packs/day for 62 years    Types: Cigarettes  . Smokeless tobacco: Never Used  . Alcohol Use: 1.0 oz/week    2 drink(s) per week  . Drug Use: No  . Sexually Active: Not Currently   Other Topics Concern  . Not on file   Social History Narrative   9th grade. married '54- widowed Dec 10th'08. 4 sons - '55, '58, '60, '69; 1 daughter - '63; 12 grandchildren; 9 great-grandchildren. 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 Other children are close and attentive. ACP/End-of-life: No CPR, no mechanical ventilation, no futile/heroic measures.    Current Outpatient Prescriptions on File Prior to Visit  Medication Sig Dispense Refill  . aspirin 81 MG tablet Take 81 mg by mouth daily.        Marland Kitchen diltiazem (CARDIZEM) 120 MG tablet Take 1 tablet (120 mg total) by mouth daily.  30 tablet  6  . dorzolamide-timolol (COSOPT) 22.3-6.8 MG/ML ophthalmic solution Place 1 drop into the left eye daily.        Marland Kitchen  lisinopril-hydrochlorothiazide (PRINZIDE,ZESTORETIC) 20-12.5 MG per tablet Take 1 tablet by mouth daily.  30 tablet  5  . meloxicam (MOBIC) 15 MG tablet Take 1 tablet (15 mg total) by mouth daily. May resume when not taking ketorolac  30 tablet  6  . metoprolol (LOPRESSOR) 50 MG tablet Take 1/2 tablet by mouth twice daily.  30 tablet  6  . simvastatin (ZOCOR) 80 MG tablet Take 1 tablet (80 mg total) by mouth at bedtime.  30 tablet  6  . ketorolac (TORADOL) 10 MG tablet Take 10 mg by mouth 3 (three) times daily as needed.      Marland Kitchen oxyCODONE (OXY IR/ROXICODONE) 5 MG immediate release tablet Take 5-10 mg by mouth every 4 (four) hours as needed.            Review of Systems System review is negative for any constitutional, cardiac, pulmonary, GI or neuro symptoms or complaints other than as described in the HPI.     Objective:   Physical Exam Filed Vitals:   03/18/12 1410  BP: 130/68  Pulse: 64    Temp: 98.2 F (36.8 C)  Resp: 16   gen'l- pleasant older woman in no distress HEENT- C&S clear Cor - RRR II/VI systolic murmur best at RSB PUlm - Clear to A& P        Assessment & Plan:

## 2012-03-19 NOTE — Assessment & Plan Note (Signed)
Patient with colonoscopy in June '12 - single polyp. She has had heme positive stool. She is to return to GI in [place] - she is provided a copy of colonoscopy report: she should not need repeat study.

## 2012-03-19 NOTE — Assessment & Plan Note (Signed)
Directly asked the patient to please stop smoking

## 2012-03-19 NOTE — Assessment & Plan Note (Signed)
BP Readings from Last 3 Encounters:  03/18/12 130/68  03/15/12 103/67  07/29/11 128/72   OK control on present medications.

## 2012-03-19 NOTE — Assessment & Plan Note (Signed)
Recent observation stay at [facility] - ruled out for MI. 2 D echo was done - normal. She has not had recurrent chest pain.  Plan Continue risk reduction (patient asked to stop smoking)  See Dr. Allyson Sabal as scheduled

## 2012-03-23 ENCOUNTER — Other Ambulatory Visit: Payer: Self-pay | Admitting: *Deleted

## 2012-03-23 MED ORDER — LISINOPRIL-HYDROCHLOROTHIAZIDE 20-12.5 MG PO TABS: 1.0000 | ORAL_TABLET | Freq: Every day | ORAL | Status: DC

## 2012-03-23 MED ORDER — DILTIAZEM HCL 120 MG PO TABS: 120.0000 mg | ORAL_TABLET | Freq: Every day | ORAL | Status: DC

## 2012-04-23 ENCOUNTER — Telehealth: Payer: Self-pay | Admitting: Internal Medicine

## 2012-04-23 MED ORDER — SIMVASTATIN 80 MG PO TABS: 80.0000 mg | ORAL_TABLET | Freq: Every day | ORAL | Status: DC

## 2012-04-23 MED ORDER — COLCHICINE 0.6 MG PO TABS: 0.6000 mg | ORAL_TABLET | Freq: Every day | ORAL | Status: DC

## 2012-04-23 NOTE — Telephone Encounter (Signed)
Colchicine 0.6 mg take 2 (1.2 mg) now, may take another 0.6 mg at 1 hr if not relieved. Rx done  If no relief may take Aleve 2 tablets twice a day.

## 2012-04-23 NOTE — Telephone Encounter (Signed)
Patient Information:  Caller Name: Bjorn Loser  Phone: [phone]  Patient: Tammy Brooks, Tammy Brooks  Gender: Female  DOB: [birth year]  Age: 78 Years  PCP: Illene Regulus (Adults only)   Symptoms  Reason For Call & Symptoms: Patient states she has Gout in both feet. L > R. She had to been seen in the ER 03/18/12. She was prescribed steroids and Hydrocodone. Patient states she followed with Dr. Debby Bud and he told her if she had another episode call for medication. Onset of pain 04/15/12  Reviewed Health History In EMR: Yes  Reviewed Medications In EMR: Yes  Reviewed Allergies In EMR: Yes  Date of Onset of Symptoms: 04/16/2011  Guideline(s) Used:  Foot Pain  Disposition Per Guideline:   See Within 3 Days in Office  Reason For Disposition Reached:   Pain in the big toe joint  Advice Given:  Reassurance - Foot Pain  : The symptoms you describe do not sound serious. You have told me that there is no redness, swelling, or fever. You have also told me that there has been no recent major injury.  Aggravating Factors   Aging:  Overuse  Shoes  Pain Medicines:  For pain relief, you can take either acetaminophen, ibuprofen, or naproxen.  Office Follow Up:  Does the office need to follow up with this patient?: Yes  Instructions For The Office: Patient is requesting Prescription for Gout medication  RN Note:  Patient does have Anti Purine diet list. DIETARY GUIDELINES REVIEWED. Patient does drink on weekends (2 mixed drinks) . Advsied no alcohol. Requesting Rx for medication for the gout. Pharmacy [facility] [phone]

## 2012-04-24 ENCOUNTER — Other Ambulatory Visit: Payer: Self-pay

## 2012-04-24 MED ORDER — COLCHICINE 0.6 MG PO TABS: ORAL_TABLET | ORAL | Status: DC

## 2012-04-24 MED ORDER — COLCHICINE 0.6 MG PO TABS: 0.6000 mg | ORAL_TABLET | Freq: Every day | ORAL | Status: DC

## 2012-04-24 NOTE — Addendum Note (Signed)
Addended by: Anselm Jungling on: 04/24/2012 08:58 AM   Modules accepted: Orders

## 2012-09-24 ENCOUNTER — Other Ambulatory Visit: Payer: Self-pay

## 2012-09-24 MED ORDER — METOPROLOL TARTRATE 50 MG PO TABS: ORAL_TABLET | ORAL | Status: DC

## 2012-11-26 ENCOUNTER — Other Ambulatory Visit: Payer: Self-pay

## 2012-11-27 MED ORDER — MELOXICAM 15 MG PO TABS: 15.0000 mg | ORAL_TABLET | Freq: Every day | ORAL | Status: DC

## 2012-12-28 ENCOUNTER — Other Ambulatory Visit: Payer: Self-pay

## 2012-12-28 MED ORDER — SIMVASTATIN 80 MG PO TABS: 80.0000 mg | ORAL_TABLET | Freq: Every day | ORAL | Status: DC

## 2013-02-17 ENCOUNTER — Other Ambulatory Visit: Payer: Self-pay | Admitting: Internal Medicine

## 2013-03-03 ENCOUNTER — Encounter: Payer: Self-pay | Admitting: Cardiology

## 2013-03-04 ENCOUNTER — Encounter: Payer: Self-pay | Admitting: Internal Medicine

## 2013-03-04 ENCOUNTER — Ambulatory Visit (INDEPENDENT_AMBULATORY_CARE_PROVIDER_SITE_OTHER): Payer: Medicare Other | Admitting: Internal Medicine

## 2013-03-04 VITALS — BP 128/68 | HR 56 | Temp 97.1°F | Ht 64.0 in | Wt 156.4 lb

## 2013-03-04 DIAGNOSIS — Z0000 Encounter for general adult medical examination without abnormal findings: Secondary | ICD-10-CM

## 2013-03-04 DIAGNOSIS — I2510 Atherosclerotic heart disease of native coronary artery without angina pectoris: Secondary | ICD-10-CM

## 2013-03-04 DIAGNOSIS — I350 Nonrheumatic aortic (valve) stenosis: Secondary | ICD-10-CM

## 2013-03-04 DIAGNOSIS — I714 Abdominal aortic aneurysm, without rupture: Secondary | ICD-10-CM

## 2013-03-04 DIAGNOSIS — I359 Nonrheumatic aortic valve disorder, unspecified: Secondary | ICD-10-CM

## 2013-03-04 DIAGNOSIS — I10 Essential (primary) hypertension: Secondary | ICD-10-CM

## 2013-03-04 DIAGNOSIS — M1990 Unspecified osteoarthritis, unspecified site: Secondary | ICD-10-CM | POA: Insufficient documentation

## 2013-03-04 DIAGNOSIS — E785 Hyperlipidemia, unspecified: Secondary | ICD-10-CM

## 2013-03-04 DIAGNOSIS — M109 Gout, unspecified: Secondary | ICD-10-CM | POA: Insufficient documentation

## 2013-03-04 MED ORDER — COLCHICINE 0.6 MG PO TABS: ORAL_TABLET | ORAL | Status: DC

## 2013-03-04 MED ORDER — ALLOPURINOL 100 MG PO TABS: 100.0000 mg | ORAL_TABLET | Freq: Every day | ORAL | Status: DC

## 2013-03-04 NOTE — Assessment & Plan Note (Addendum)
AAA detected on commercial health screening w/ aortic diameter 4.4x5.5 cm. Her mother and grandmother had AAA. She has no symptoms of chest pain currently. On physical exam, the aortic pulse is palpable, but no mass was palpated. Patient reports that her mother died of a ruptured AAA, and her aunt also had an AAA.   Follow-up with abdominal US in 6 months to evaluate report of aneurysm and evaluate for any size changes.

## 2013-03-04 NOTE — Assessment & Plan Note (Addendum)
Interval hx stable w/o acute major illness, surgery or injury. Physical exam normal. Labs ordered with recommendations to follow. She is current with colorectal cancer screening with next study for 2017.  Mammogram scheduled for March of 2015 (at least 2 years after last mammogram on 08/19/11). Immunizations are current and she is given annual flu shot today. She did participate in Health screening: normal A1C, (?) AAA with diameter of 4.5 cm - will follow up in 6 months due to Mother and grandmother having had AAA. A1C was done - normal range.  In summary A nice woman who does appear medically stable. She will follow up with Dr. Allyson Sabal as instructed and return to IM clinic in 1 year or sooner as needed.

## 2013-03-04 NOTE — Assessment & Plan Note (Addendum)
Taking and tolerating medications. BP Readings from Last 3 Encounters:  03/04/13 128/68  03/18/12 130/68  03/15/12 103/67   Continue current plan of care including lisinopril/HCTZ, metoprolol, and diltiazem.

## 2013-03-04 NOTE — Assessment & Plan Note (Addendum)
Tammy Brooks presents with tenderness with motion of her metatarsal-phalangeal joints, her tarsal-metatarsal joints, and pain on motion of her left middle finger DIP. On physical exam there is ulnar deviation of both her hands and feet. There are also heberden's and bouchard's nodules affecting the DIP and PIP hand joints. There is minimal erythema. Based on these findings of more distal joint involvement, osteoarthritis is more likely than rheumatoid arthritis. The pain in her joints is worse in the evenings.   Plan Take aleve or motrin by mouth as needed for joint pain.

## 2013-03-04 NOTE — Patient Instructions (Addendum)
1. Gout - no active flare now. But, you have had more than 2 flares in 12 months and have a high uric acid Plan Allopurinol 100 mg once a day.  Colchicine 0.6 mg once a day for the first 3 weeks of allopurinol. If it is too rough on you let me know and we will use prednisone  2. Arthritis - the pain in the small toe joints and fingers is osteoarthritis - wear and tear Plan When you have joint pain take motrin 200 mg  Two tablets every 6 hours.  3. Aneurysm - will recheck in 6 months  4. Aortic stenosis - will leave to Dr. Allyson Sabal.  5. You do need a mammogram.  Osteoarthritis Osteoarthritis is the most common form of arthritis. It is redness, soreness, and swelling (inflammation) affecting the cartilage. Cartilage acts as a cushion, covering the ends of bones where they meet to form a joint. CAUSES  Over time, the cartilage begins to wear away. This causes bone to rub on bone. This produces pain and stiffness in the affected joints. Factors that contribute to this problem are:  Excessive body weight.  Age.  Overuse of joints. SYMPTOMS   People with osteoarthritis usually experience joint pain, swelling, or stiffness.  Over time, the joint may lose its normal shape.  Small deposits of bone (osteophytes) may grow on the edges of the joint.  Bits of bone or cartilage can break off and float inside the joint space. This may cause more pain and damage.  Osteoarthritis can lead to depression, anxiety, feelings of helplessness, and limitations on daily activities. The most commonly affected joints are in the:  Ends of the fingers.  Thumbs.  Neck.  Lower back.  Knees.  Hips. DIAGNOSIS  Diagnosis is mostly based on your symptoms and exam. Tests may be helpful, including:  X-rays of the affected joint.  A computerized magnetic scan (MRI).  Blood tests to rule out other types of arthritis.  Joint fluid tests. This involves using a needle to draw fluid from the joint and  examining the fluid under a microscope. TREATMENT  Goals of treatment are to control pain, improve joint function, maintain a normal body weight, and maintain a healthy lifestyle. Treatment approaches may include:  A prescribed exercise program with rest and joint relief.  Weight control with nutritional education.  Pain relief techniques such as:  Properly applied heat and cold.  Electric pulses delivered to nerve endings under the skin (transcutaneous electrical nerve stimulation, TENS).  Massage.  Certain supplements. Ask your caregiver before using any supplements, especially in combination with prescribed drugs.  Medicines to control pain, such as:  Acetaminophen.  Nonsteroidal anti-inflammatory drugs (NSAIDs), such as naproxen.  Narcotic or central-acting agents, such as tramadol. This drug carries a risk of addiction and is generally prescribed for short-term use.  Corticosteroids. These can be given orally or as injection. This is a short-term treatment, not recommended for routine use.  Surgery to reposition the bones and relieve pain (osteotomy) or to remove loose pieces of bone and cartilage. Joint replacement may be needed in advanced states of osteoarthritis. HOME CARE INSTRUCTIONS  Your caregiver can recommend specific types of exercise. These may include:  Strengthening exercises. These are done to strengthen the muscles that support joints affected by arthritis. They can be performed with weights or with exercise bands to add resistance.  Aerobic activities. These are exercises, such as brisk walking or low-impact aerobics, that get your heart pumping. They can help  keep your lungs and circulatory system in shape.  Range-of-motion activities. These keep your joints limber.  Balance and agility exercises. These help you maintain daily living skills. Learning about your condition and being actively involved in your care will help improve the course of your  osteoarthritis. SEEK MEDICAL CARE IF:   You feel hot or your skin turns red.  You develop a rash in addition to your joint pain.  You have an oral temperature above 102 F (38.9 C). FOR MORE INFORMATION  National Institute of Arthritis and Musculoskeletal and Skin Diseases: www.niams.http://www.myers.net/ [employer] on Aging: https://walker.com/ American College of Rheumatology: www.rheumatology.org Document Released: 05/20/2005 Document Revised: 08/12/2011 Document Reviewed: 08/31/2009 [facility] Patient Information 2014 [place], [place].

## 2013-03-04 NOTE — Assessment & Plan Note (Addendum)
No symptoms of syncope, dizziness, or visual changes. Patient reports that she has had a heart murmurs since birth.Continue follow-up with cardiology.   Plan Per Dr. Allyson Sabal, next appt Oct 7th

## 2013-03-04 NOTE — Progress Notes (Signed)
Subjective:     Patient ID: MOLLEE NEER, female   DOB: December 15, 1933, 79 y.o.   MRN: [id number]  HPI Ms. Hollingshead is a 79 year old woman with a history of CAD, PAD, HTN, hyperlipidemia, aortic stenosis, glacuoma, urinary incontinence and gout who presents for management of diarrhea she is experiencing with her colchicine used for gout. She was diagnosed with gout in August, 2014, and has since had 1 major flare last weekend. While taking 2 tabs of colchicine she had diarrhea--3 stools that day-- which resolved after stopping the colchicine. She took 3 tabs of colchicine two days later and did not experience any diarrhea. She requests another medication for her gout. Today she is experiencing some tenderness over the metatarsal-phalangeal joints of both feet, which radiates along the arch of her feet. This pain is less than what she typically feels during a gout flare and is not painful at rest. She does experience pain while walking.   In regards to healthcare maintenance, Ms. Bohman also asks about a mammogram. Her last mammogram was in March of 2013, and there were no abnormalities noted. She reports having gone to a commercial health care screening fair, where an abdominal US noted a 4.5x4.5x5 cm abdominal aortic aneurysm. The documents were otherwise unremarkable and were scanned into the record.   Past Medical History  Diagnosis Date  . Arthritis   . Blind left eye   . Heart murmur   . Hyperlipidemia   . Hypertension   . Myocardial infarction     mild/ age 51  . Stroke 2001    no paralysis  . Ulcer   . CAD (coronary artery disease) 08/1995    50-55% second diag dz.; occluded OM  . PAD (peripheral artery disease)     mild to mod Rt ICA stenosis followed by doppler-02/05/12  . Echocardiogram abnormal 03/2012    mild to mod AS, valve area1.58 cm2, mild concentric LVH, grade I diastolic dusfunction  . Tobacco abuse    Past Surgical History  Procedure Laterality Date  . Cataract extraction w/  intraocular lens  implant, bilateral    . Eye surgery      lense implant removed/ blind in left eye  . Upper gastrointestinal endoscopy      treated for h-pylori  . Colonoscopy    . Polypectomy    . Tubal ligation    . Appendectomy    . Tonsillectomy and adenoidectomy    . Abdominal hysterectomy    . Cardiac catheterization  08/1995    50-55% second diag lesion, occluded OM   Family History  Problem Relation Age of Onset  . Aneurysm Mother     AAA  . Hypertension Mother   . Heart disease Father     CAD, MI x 3  . Heart attack Father   . Cancer Maternal Aunt     breast cancer   History   Social History  . Marital Status: Widowed    Spouse Name: N/A    Number of Children: 5  . Years of Education: 9   Occupational History  . Psychiatric nurse     retired   Social History Main Topics  . Smoking status: Current Every Day Smoker -- 1.00 packs/day for 62 years    Types: Cigarettes  . Smokeless tobacco: Never Used  . Alcohol Use: 1.0 oz/week    2 drink(s) per week  . Drug Use: No  . Sexual Activity: Not Currently   Other Topics Concern  .  Not on file   Social History Narrative   9th grade. married '54- widowed Dec 10th'08. 4 sons - '55, '58, '60, '69; 1 daughter - '63; 12 grandchildren; 9 great-grandchildren. 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 Other children are close and attentive. ACP/End-of-life: No CPR, no mechanical ventilation, no futile/heroic measures.      Review of Systems --Constitutional: No HA, dizziness, or visual changes from baseline diminished vision in left eye.  --CV: No chest pain or SOB.  --Pulmonary: No SOB. --Abdominal: 2-3 times over the past week has experience central abdominal pain that feels like "a knot in my stomach" and is associated with nausea and "water forming in my mouth". No vomiting. No diarrhea except as described in HPI. Occasional constipation well-controlled with polyethylene  glycol.  -- MSK: Joint pain as described in HPI as well as tender nodule over left middle finger's DIP joint. Pain is worst in the evenings. No muscle aches.     Objective:   Physical Exam  Filed Vitals:   03/04/13 1016  BP: 128/68  Pulse: 56  Temp: 97.1 F (36.2 C)    General: Sitting in the exam room in no acute distress.  CV: Regular rate and rhythm, soft systolic murmur.  Pulm: Lungs clear to auscultation bilaterally.  Abdominal: Normal active bowel sounds. No tenderness to palpation. Aoritc pulsation palpated, but no masses palpated. Hepatic edge palpated 1 cm below the costal margin.  MSK: Ulnar deviation of both hands and feet. In the feet, there is tenderness on motion of 9/10 metatarsal-phalangeal joints, as well as the tarsal-metatarsal joints. No erythema or swelling. In the hands, there are Bouchard and Heberden nodules. There is no tenderness on motion of the DIP, PIP, or metacarpal-phalangeal joints. There is tenderness of one DIP joint on the left middle finger. No erythema noted.      Current outpatient prescriptions:aspirin 81 MG tablet, Take 81 mg by mouth daily.  , Disp: , Rfl: ;  colchicine 0.6 MG tablet, Take 2 tablets for gout pain, may take 1 tablet 1 hr later if no relief, Disp: 21 tablet, Rfl: 0;  diltiazem (CARDIZEM) 120 MG tablet, TAKE 1 TABLET BY MOUTH DAILY, Disp: 30 tablet, Rfl: 0;  dorzolamide-timolol (COSOPT) 22.3-6.8 MG/ML ophthalmic solution, Place 1 drop into the left eye daily.  , Disp: , Rfl:  ketorolac (TORADOL) 10 MG tablet, Take 10 mg by mouth 3 (three) times daily as needed., Disp: , Rfl: ;  lisinopril-hydrochlorothiazide (PRINZIDE,ZESTORETIC) 20-12.5 MG per tablet, TAKE 1 TABLET BY MOUTH EVERY DAY, Disp: 30 tablet, Rfl: 0;  meloxicam (MOBIC) 15 MG tablet, Take 1 tablet (15 mg total) by mouth daily. May resume when not taking ketorolac, Disp: 30 tablet, Rfl: 6 metoprolol (LOPRESSOR) 50 MG tablet, Take 1/2 tablet by mouth twice daily., Disp: 30 tablet,  Rfl: 6;  oxyCODONE (OXY IR/ROXICODONE) 5 MG immediate release tablet, Take 5-10 mg by mouth every 4 (four) hours as needed., Disp: , Rfl: ;  Polyethylene Glycol 3350 POWD, 1 CAPFUL OF POWDER MIXED IN 8 0Z OF WATER 2 X DAY, Disp: , Rfl: ;  simvastatin (ZOCOR) 80 MG tablet, Take 1 tablet (80 mg total) by mouth at bedtime., Disp: 30 tablet, Rfl: 6 allopurinol (ZYLOPRIM) 100 MG tablet, Take 1 tablet (100 mg total) by mouth daily., Disp: 30 tablet, Rfl: 6  Assessment:       Ms. Spohr is a 79 year old woman who presents with loose stools  related to her colchicine use for gout, as well as joint changes in her hands and feet more consistent with arthritis than gout. Her lack of excruciating single-joint pain makes gout less likely as the cause of her current joint pain. The ulnar deviation of her hands and feet is more consistent with rheumatoid arthritis, but the more distal joint involvement and Heberden and Bouchard nodules suggests the diagnosis is more likely osteoarthritis. She also presents with a questionable abdominal aortic aneurysm.      Plan:     See plan by problem list.

## 2013-03-04 NOTE — Assessment & Plan Note (Addendum)
She reports a recent gout attack in September with multiple joints affected and less erythema and heat than her previous attack. Her current presentation of joint tenderness and swelling is more consistent with osteoarthritis; however given her history of gout and her elevated uric acid level, she probably has superimposed gout flares. She used colchicine to relieve gout pain the past, but was uncomfortable with 3 loose stools/day when taking 2 tablets of colchicine.   Given her high serum uric acid and continued gout flares, treatment with allopurinol to lower serum uric acid is indicated. To prevent allopurinol associated gout flares at the onset, we will also treat with colchicine to prevent acute gout attacks.  Start colchicine 1x daily for 3 days, then continue colchicine and start allopurinol 100 mg daily. After 3 weeks, discontinue the colchicine and continue the allopurinol. If diarrhea with colchicine is limiting, return for follow-up care.

## 2013-03-05 NOTE — Assessment & Plan Note (Signed)
Due for routine follow up lab with recommendations to follow.

## 2013-03-05 NOTE — Assessment & Plan Note (Signed)
No c/o chest pain or other cardiac symptoms. Follows closely with Dr. Nanetta Batty [facility])

## 2013-03-09 ENCOUNTER — Ambulatory Visit (INDEPENDENT_AMBULATORY_CARE_PROVIDER_SITE_OTHER): Payer: Medicare Other | Admitting: Cardiology

## 2013-03-09 ENCOUNTER — Encounter: Payer: Self-pay | Admitting: Cardiology

## 2013-03-09 VITALS — BP 154/82 | HR 61 | Ht 64.0 in | Wt 155.4 lb

## 2013-03-09 DIAGNOSIS — R609 Edema, unspecified: Secondary | ICD-10-CM

## 2013-03-09 DIAGNOSIS — M109 Gout, unspecified: Secondary | ICD-10-CM

## 2013-03-09 DIAGNOSIS — I359 Nonrheumatic aortic valve disorder, unspecified: Secondary | ICD-10-CM

## 2013-03-09 DIAGNOSIS — I714 Abdominal aortic aneurysm, without rupture: Secondary | ICD-10-CM

## 2013-03-09 DIAGNOSIS — I2510 Atherosclerotic heart disease of native coronary artery without angina pectoris: Secondary | ICD-10-CM

## 2013-03-09 DIAGNOSIS — F17200 Nicotine dependence, unspecified, uncomplicated: Secondary | ICD-10-CM

## 2013-03-09 DIAGNOSIS — Z79899 Other long term (current) drug therapy: Secondary | ICD-10-CM

## 2013-03-09 DIAGNOSIS — R600 Localized edema: Secondary | ICD-10-CM | POA: Insufficient documentation

## 2013-03-09 DIAGNOSIS — J309 Allergic rhinitis, unspecified: Secondary | ICD-10-CM

## 2013-03-09 DIAGNOSIS — J302 Other seasonal allergic rhinitis: Secondary | ICD-10-CM

## 2013-03-09 DIAGNOSIS — I350 Nonrheumatic aortic (valve) stenosis: Secondary | ICD-10-CM

## 2013-03-09 DIAGNOSIS — E785 Hyperlipidemia, unspecified: Secondary | ICD-10-CM

## 2013-03-09 DIAGNOSIS — Z8679 Personal history of other diseases of the circulatory system: Secondary | ICD-10-CM | POA: Insufficient documentation

## 2013-03-09 MED ORDER — POTASSIUM CHLORIDE ER 10 MEQ PO TBCR: 10.0000 meq | EXTENDED_RELEASE_TABLET | Freq: Every day | ORAL | Status: DC | PRN

## 2013-03-09 MED ORDER — FUROSEMIDE 20 MG PO TABS: 20.0000 mg | ORAL_TABLET | Freq: Every day | ORAL | Status: DC | PRN

## 2013-03-09 NOTE — Patient Instructions (Addendum)
Your physician recommends that you return for lab work. You will need to be fasting for this blood work - nothing to eat/drink after midnight.   Take Lasix (furesemide) 20 mg once daily only if needed for lower extremity edema, it may worsen gout. If you take the Lasix take the Kdur also  Take claritin for ear popping over the counter  Follow up with Dr. Allyson Sabal in 6 months, unless problems before then.

## 2013-03-09 NOTE — Assessment & Plan Note (Signed)
stable °

## 2013-03-09 NOTE — Progress Notes (Signed)
03/09/2013   PCP: Illene Regulus, MD   Chief Complaint  Patient presents with  . 6 months    bilat LE edema - gout; saw Dr. Arthur Holms last thursday, ear popping since Friday    Primary Cardiologist: Dr. Allyson Sabal  HPI:  79 year old WWF with hx of remote mild CAD by cath by Dr. Allyson Sabal in 1997. Last nuc study 2009 was low risk exam.  Last echo in 2013 with AS and valve area 1.6 cm2 and normal LV function.  She presents today with complaints of lower ext edema.  Some she acknowledges is from the gout.  No chest pain, no SOB.   Only other complaint is "popping sounds" in her rt ear.  She continues to smoke. Reminded of complications from tobacco.  Allergies  Allergen Reactions  . Asa [Aspirin]     Stomach ulcers  . Nabumetone Other (See Comments)    Causes inflammation    Current Outpatient Prescriptions  Medication Sig Dispense Refill  . allopurinol (ZYLOPRIM) 100 MG tablet Take 1 tablet (100 mg total) by mouth daily.  30 tablet  6  . aspirin 81 MG tablet Take 81 mg by mouth daily.        . colchicine 0.6 MG tablet Take 2 tablets for gout pain, may take 1 tablet 1 hr later if no relief  21 tablet  0  . diltiazem (CARDIZEM) 120 MG tablet TAKE 1 TABLET BY MOUTH DAILY  30 tablet  0  . dorzolamide-timolol (COSOPT) 22.3-6.8 MG/ML ophthalmic solution Place 1 drop into the left eye daily.        Marland Kitchen lisinopril-hydrochlorothiazide (PRINZIDE,ZESTORETIC) 20-12.5 MG per tablet TAKE 1 TABLET BY MOUTH EVERY DAY  30 tablet  0  . meloxicam (MOBIC) 15 MG tablet Take 1 tablet (15 mg total) by mouth daily. May resume when not taking ketorolac  30 tablet  6  . metoprolol (LOPRESSOR) 50 MG tablet Take 1/2 tablet by mouth twice daily.  30 tablet  6  . Polyethylene Glycol 3350 POWD 1 CAPFUL OF POWDER MIXED IN 8 0Z OF WATER 2 X DAY      . simvastatin (ZOCOR) 80 MG tablet Take 1 tablet (80 mg total) by mouth at bedtime.  30 tablet  6  . furosemide (LASIX) 20 MG tablet Take 1 tablet (20 mg total)  by mouth daily as needed for edema.  30 tablet  3  . potassium chloride (K-DUR) 10 MEQ tablet Take 1 tablet (10 mEq total) by mouth daily as needed (take only if you take furosemide for swelling).  30 tablet  3   No current facility-administered medications for this visit.    Past Medical History  Diagnosis Date  . Arthritis   . Blind left eye   . Heart murmur   . Hyperlipidemia   . Hypertension   . Myocardial infarction     mild/ age 16  . Stroke 2001    no paralysis  . Ulcer   . CAD (coronary artery disease) 08/1995    50-55% second diag dz.; occluded OM  . PAD (peripheral artery disease)     mild to mod Rt ICA stenosis followed by doppler-02/05/12  . Echocardiogram abnormal 03/2012    mild to mod AS, valve area1.58 cm2, mild concentric LVH, grade I diastolic dusfunction  . Tobacco abuse     Past Surgical History  Procedure Laterality Date  . Cataract extraction w/ intraocular lens  implant, bilateral    .  Eye surgery      lense implant removed/ blind in left eye  . Upper gastrointestinal endoscopy      treated for h-pylori  . Colonoscopy    . Polypectomy    . Tubal ligation    . Appendectomy    . Tonsillectomy and adenoidectomy    . Abdominal hysterectomy    . Cardiac catheterization  08/1995    50-55% second diag lesion, occluded OM    EAV:[id number]:JY colds or fevers, no weight changes Skin:no rashes or ulcers HEENT:no blurred vision, + popping in her ear rt>lt, no cold symptoms  CV:see HPI PUL:see HPI GI:no diarrhea constipation or melena, no indigestion GU:no hematuria, no dysuria MS:no joint pain, no claudication, + gout treated by PCP Neuro:no syncope, no lightheadedness Endo:no diabetes, no thyroid disease  PHYSICAL EXAM BP 154/82  Pulse 61  Ht 5\' 4"  (1.626 m)  Wt 155 lb 6.4 oz (70.489 kg)  BMI 26.66 kg/m2 General:Pleasant affect, NAD Skin:Warm and dry, brisk capillary refill HEENT:normocephalic, sclera clear, mucus membranes moist, TMs clear rt ext  canal with red scrapes, fluid bubble behind lt TM Neck:supple, no JVD, no bruits  Heart:S1S2 RRR with 2/3 systolic aortic outflow murmur, no gallup, rub or click Lungs:clear without rales, rhonchi, or wheezes NWG:NFAO, non tender, + BS, do not palpate liver spleen or masses Ext:no lower ext edema, 2+ pedal pulses, 2+ radial pulses Neuro:alert and oriented, MAE, follows commands, + facial symmetry  EKG:SR no acute changes HR 61  ASSESSMENT AND PLAN Bilateral lower extremity edema Pt is having lower ext edema at times.  Increasing as the day progresses.  I did give a prescription for lasix, 20 mg prn daily for edema, along with potassium.  We did discuss that the lasix may increase her gout.  She should only take lasix if needed.  We discussed decreasing salt in her diet.   She will call if symptoms continue.     She will see Dr. Allyson Sabal in 6 months.  I did not think this was from her AS, she is ambulating without SOB.  No chest pain.    AAA (abdominal aortic aneurysm) without rupture New, Dr. Arthur Holms to follow in 6 months with abd ultrasound.  Pt and I discussed symptoms though she was reassured.  Hx of carotid artery stenosis, RT. -moderate, followed by Dr. Allyson Sabal, Last dopplers 02/2012 stable  CORONARY ARTERY DISEASE Remote cath 1997 with mild disease.Last stress test 2009 no ischemia low risk study.  No chest pain  TOBACCO ABUSE Discussed importance of stopping  Aortic stenosis Echo 03/2012 AS valve are 1.58cm2 Last echo 03/2012   Seasonal allergies Recommended OTC Claritin for the pt.  Gout 03/04/13:Ms. Dykema was recently diagnosed with gout. No x-ray foot.  A uric acid level was over 9. Relief with colchicine.

## 2013-03-09 NOTE — Assessment & Plan Note (Signed)
Remote cath 1997 with mild disease.Last stress test 2009 no ischemia low risk study.  No chest pain

## 2013-03-09 NOTE — Assessment & Plan Note (Signed)
03/04/13:Tammy Brooks was recently diagnosed with gout. No x-ray foot.  A uric acid level was over 9. Relief with colchicine.

## 2013-03-09 NOTE — Assessment & Plan Note (Signed)
Echo 03/2012 AS valve are 1.58cm2 Last echo 03/2012

## 2013-03-09 NOTE — Assessment & Plan Note (Signed)
Recommended OTC Claritin for the pt.

## 2013-03-09 NOTE — Assessment & Plan Note (Signed)
New, Dr. Arthur Holms to follow in 6 months with abd ultrasound.  Pt and I discussed symptoms though she was reassured.

## 2013-03-09 NOTE — Assessment & Plan Note (Signed)
Discussed importance of stopping 

## 2013-03-09 NOTE — Assessment & Plan Note (Addendum)
Pt is having lower ext edema at times.  Increasing as the day progresses.  I did give a prescription for lasix, 20 mg prn daily for edema, along with potassium.  We did discuss that the lasix may increase her gout.  She should only take lasix if needed.  We discussed decreasing salt in her diet.   She will call if symptoms continue.     She will see Dr. Allyson Sabal in 6 months.  I did not think this was from her AS, she is ambulating without SOB.  No chest pain.

## 2013-03-10 ENCOUNTER — Other Ambulatory Visit: Payer: Self-pay | Admitting: Cardiovascular Disease

## 2013-03-10 ENCOUNTER — Encounter: Payer: Self-pay | Admitting: Cardiology

## 2013-03-11 LAB — COMPREHENSIVE METABOLIC PANEL
ALT: 15 IU/L (ref 0–32)
AST: 20 IU/L (ref 0–40)
Albumin/Globulin Ratio: 2.2 (ref 1.1–2.5)
Albumin: 4.1 g/dL (ref 3.5–4.8)
BUN: 19 mg/dL (ref 8–27)
CO2: 25 mmol/L (ref 18–29)
Calcium: 9.1 mg/dL (ref 8.6–10.2)
Creatinine, Ser: 0.87 mg/dL (ref 0.57–1.00)
GFR calc Af Amer: 74 mL/min/{1.73_m2} (ref >59)
GFR calc non Af Amer: 64 mL/min/{1.73_m2} (ref >59)
Globulin, Total: 1.9 g/dL (ref 1.5–4.5)
Sodium: 143 mmol/L (ref 134–144)

## 2013-03-11 LAB — LIPID PANEL W/O CHOL/HDL RATIO
HDL: 30 mg/dL — ABNORMAL LOW (ref >39)
LDL Calculated: 66 mg/dL (ref 0–99)
Triglycerides: 168 mg/dL — ABNORMAL HIGH (ref 0–149)
VLDL Cholesterol Cal: 34 mg/dL (ref 5–40)

## 2013-03-12 NOTE — Progress Notes (Signed)
Quick Note:  Called and notified patient of lab results. ______

## 2013-03-14 ENCOUNTER — Encounter: Payer: Self-pay | Admitting: *Deleted

## 2013-03-14 NOTE — Progress Notes (Signed)
Quick Note:  Note sent to patient ______ 

## 2013-03-22 ENCOUNTER — Other Ambulatory Visit: Payer: Self-pay | Admitting: Internal Medicine

## 2013-03-30 ENCOUNTER — Telehealth (HOSPITAL_COMMUNITY): Payer: Self-pay | Admitting: *Deleted

## 2013-05-23 ENCOUNTER — Other Ambulatory Visit: Payer: Self-pay | Admitting: Internal Medicine

## 2013-06-18 ENCOUNTER — Telehealth: Payer: Self-pay | Admitting: Internal Medicine

## 2013-06-18 DIAGNOSIS — H4089 Other specified glaucoma: Secondary | ICD-10-CM

## 2013-06-18 DIAGNOSIS — I2510 Atherosclerotic heart disease of native coronary artery without angina pectoris: Secondary | ICD-10-CM

## 2013-06-18 DIAGNOSIS — Q159 Congenital malformation of eye, unspecified: Principal | ICD-10-CM

## 2013-06-18 NOTE — Telephone Encounter (Signed)
A referral need to be put in for what pt need

## 2013-06-18 NOTE — Telephone Encounter (Signed)
[facility] states the referral needs to be put in first for ophthalmologist And Cardiologist for this patient before they can assist

## 2013-06-18 NOTE — Telephone Encounter (Signed)
Can one of you  please assist this patient

## 2013-06-18 NOTE — Telephone Encounter (Signed)
Patient called stating she was told she need a referral due to her insurance for her eye doctor Cecilie Lowers [place]) & ([place] ) And heart doctor Lorretta Harp)  - Please Advise

## 2013-06-18 NOTE — Telephone Encounter (Signed)
Love insurance - ok for referrals. Pass along to [facility]

## 2013-06-21 NOTE — Telephone Encounter (Deleted)
I guess the referrals have to be placed by you.

## 2013-07-22 ENCOUNTER — Other Ambulatory Visit: Payer: Self-pay | Admitting: Internal Medicine

## 2013-08-19 ENCOUNTER — Other Ambulatory Visit: Payer: Self-pay | Admitting: Internal Medicine

## 2013-08-23 ENCOUNTER — Ambulatory Visit: Payer: Medicare Other | Admitting: Internal Medicine

## 2013-09-01 ENCOUNTER — Ambulatory Visit (INDEPENDENT_AMBULATORY_CARE_PROVIDER_SITE_OTHER): Payer: Commercial Managed Care - HMO | Admitting: Internal Medicine

## 2013-09-01 ENCOUNTER — Telehealth: Payer: Self-pay | Admitting: Internal Medicine

## 2013-09-01 ENCOUNTER — Encounter: Payer: Self-pay | Admitting: Internal Medicine

## 2013-09-01 VITALS — BP 122/64 | HR 72 | Temp 97.1°F | Resp 16 | Ht 63.5 in | Wt 146.0 lb

## 2013-09-01 DIAGNOSIS — I350 Nonrheumatic aortic (valve) stenosis: Secondary | ICD-10-CM

## 2013-09-01 DIAGNOSIS — I10 Essential (primary) hypertension: Secondary | ICD-10-CM

## 2013-09-01 DIAGNOSIS — E785 Hyperlipidemia, unspecified: Secondary | ICD-10-CM

## 2013-09-01 DIAGNOSIS — I714 Abdominal aortic aneurysm, without rupture, unspecified: Secondary | ICD-10-CM

## 2013-09-01 DIAGNOSIS — M109 Gout, unspecified: Secondary | ICD-10-CM

## 2013-09-01 DIAGNOSIS — I2510 Atherosclerotic heart disease of native coronary artery without angina pectoris: Secondary | ICD-10-CM

## 2013-09-01 DIAGNOSIS — Z8679 Personal history of other diseases of the circulatory system: Secondary | ICD-10-CM

## 2013-09-01 DIAGNOSIS — I359 Nonrheumatic aortic valve disorder, unspecified: Secondary | ICD-10-CM

## 2013-09-01 MED ORDER — ALLOPURINOL 100 MG PO TABS: 100.0000 mg | ORAL_TABLET | Freq: Every day | ORAL | Status: DC

## 2013-09-01 MED ORDER — MELOXICAM 15 MG PO TABS: 15.0000 mg | ORAL_TABLET | Freq: Every day | ORAL | Status: DC | PRN

## 2013-09-01 MED ORDER — COLCHICINE 0.6 MG PO TABS: 0.6000 mg | ORAL_TABLET | Freq: Four times a day (QID) | ORAL | Status: DC | PRN

## 2013-09-01 MED ORDER — METOPROLOL TARTRATE 50 MG PO TABS: 50.0000 mg | ORAL_TABLET | Freq: Two times a day (BID) | ORAL | Status: DC

## 2013-09-01 MED ORDER — SIMVASTATIN 40 MG PO TABS: 40.0000 mg | ORAL_TABLET | Freq: Every day | ORAL | Status: DC

## 2013-09-01 MED ORDER — DILTIAZEM HCL 120 MG PO TABS: 120.0000 mg | ORAL_TABLET | Freq: Every day | ORAL | Status: DC

## 2013-09-01 MED ORDER — VITAMIN D 1000 UNITS PO TABS: 1000.0000 [IU] | ORAL_TABLET | Freq: Every day | ORAL | Status: DC

## 2013-09-01 MED ORDER — LISINOPRIL 20 MG PO TABS: 20.0000 mg | ORAL_TABLET | Freq: Every day | ORAL | Status: DC

## 2013-09-01 NOTE — Assessment & Plan Note (Signed)
D/c HCTZ Continue with current prescription therapy as reflected on the Med list.

## 2013-09-01 NOTE — Progress Notes (Signed)
Pre visit review using our clinic review tool, if applicable. No additional management support is needed unless otherwise documented below in the visit note. 

## 2013-09-01 NOTE — Assessment & Plan Note (Addendum)
Continue with current prescription therapy as reflected on the Med list.  

## 2013-09-01 NOTE — Assessment & Plan Note (Signed)
Continue with current prescription therapy as reflected on the Med list.  

## 2013-09-01 NOTE — Assessment & Plan Note (Signed)
Continue with current prescription therapy as reflected on the Med list. Stop smoking

## 2013-09-01 NOTE — Progress Notes (Signed)
   Subjective:    Patient ID: Tammy Brooks, female    DOB: [birth year], 80 y.o.   MRN: [id number]  HPI  New pt - switchng from Dr Linda Hedges C/o gout in B feet, OA, AS, AAA, CAD She is a smoker  BP Readings from Last 3 Encounters:  09/01/13 122/64  03/09/13 154/82  03/04/13 128/68   Wt Readings from Last 3 Encounters:  09/01/13 146 lb (66.225 kg)  03/09/13 155 lb 6.4 oz (70.489 kg)  03/04/13 156 lb 6.4 oz (70.943 kg)      Review of Systems  Constitutional: Negative for chills, activity change, appetite change, fatigue and unexpected weight change.  HENT: Negative for congestion, mouth sores and sinus pressure.   Eyes: Negative for visual disturbance.  Respiratory: Negative for cough and chest tightness.   Gastrointestinal: Negative for nausea and abdominal pain.  Genitourinary: Positive for vaginal bleeding. Negative for frequency, difficulty urinating and vaginal pain.  Musculoskeletal: Negative for back pain and gait problem.  Skin: Negative for pallor and rash.  Neurological: Negative for dizziness, tremors, weakness, numbness and headaches.  Psychiatric/Behavioral: Negative for confusion and sleep disturbance. The patient is not nervous/anxious.        Objective:   Physical Exam  Constitutional: She appears well-developed. No distress.  HENT:  Head: Normocephalic.  Right Ear: External ear normal.  Left Ear: External ear normal.  Nose: Nose normal.  Mouth/Throat: Oropharynx is clear and moist.  Eyes: Conjunctivae are normal. Pupils are equal, round, and reactive to light. Right eye exhibits no discharge. Left eye exhibits no discharge.  Neck: Normal range of motion. Neck supple. No JVD present. No tracheal deviation present. No thyromegaly present.  Cardiovascular: Normal rate, regular rhythm and normal heart sounds.   Pulmonary/Chest: No stridor. No respiratory distress. She has no wheezes.  Abdominal: Soft. Bowel sounds are normal. She exhibits no distension and no  mass. There is no tenderness. There is no rebound and no guarding.  Musculoskeletal: She exhibits no edema and no tenderness.  Lymphadenopathy:    She has no cervical adenopathy.  Neurological: She displays normal reflexes. No cranial nerve deficit. She exhibits normal muscle tone. Coordination normal.  Skin: No rash noted. No erythema.  Psychiatric: She has a normal mood and affect. Her behavior is normal. Judgment and thought content normal.   Lab Results  Component Value Date   WBC 12.0* 03/15/2012   HGB 13.3 03/15/2012   HCT 39.8 03/15/2012   PLT 233 03/15/2012   GLUCOSE 109* 03/10/2013   CHOL 123 07/29/2011   TRIG 168* 03/10/2013   HDL 30* 03/10/2013   LDLDIRECT 76.7 11/17/2008   LDLCALC 66 03/10/2013   ALT 15 03/10/2013   AST 20 03/10/2013   NA 143 03/10/2013   K 4.4 03/10/2013   CL 105 03/10/2013   CREATININE 0.87 03/10/2013   BUN 19 03/10/2013   CO2 25 03/10/2013   TSH 4.407 03/15/2012     A complex case     Assessment & Plan:

## 2013-09-01 NOTE — Telephone Encounter (Signed)
Relevant patient education mailed to patient.  

## 2013-09-01 NOTE — Assessment & Plan Note (Signed)
Stop smoking

## 2013-09-05 NOTE — Assessment & Plan Note (Signed)
Continue with current prescription therapy as reflected on the Med list.  

## 2013-10-05 ENCOUNTER — Other Ambulatory Visit: Payer: Self-pay | Admitting: *Deleted

## 2013-10-05 MED ORDER — DILTIAZEM HCL 120 MG PO TABS: 120.0000 mg | ORAL_TABLET | Freq: Every day | ORAL | Status: DC

## 2013-12-30 ENCOUNTER — Other Ambulatory Visit: Payer: Self-pay | Admitting: *Deleted

## 2013-12-30 MED ORDER — MELOXICAM 15 MG PO TABS: 15.0000 mg | ORAL_TABLET | Freq: Every day | ORAL | Status: DC | PRN

## 2013-12-30 MED ORDER — METOPROLOL TARTRATE 50 MG PO TABS: 50.0000 mg | ORAL_TABLET | Freq: Two times a day (BID) | ORAL | Status: DC

## 2013-12-30 MED ORDER — SIMVASTATIN 40 MG PO TABS: 40.0000 mg | ORAL_TABLET | Freq: Every day | ORAL | Status: DC

## 2013-12-31 ENCOUNTER — Other Ambulatory Visit: Payer: Self-pay | Admitting: *Deleted

## 2013-12-31 MED ORDER — DILTIAZEM HCL 120 MG PO TABS: 120.0000 mg | ORAL_TABLET | Freq: Every day | ORAL | Status: DC

## 2013-12-31 MED ORDER — ALLOPURINOL 100 MG PO TABS: 100.0000 mg | ORAL_TABLET | Freq: Every day | ORAL | Status: DC

## 2013-12-31 MED ORDER — LISINOPRIL 20 MG PO TABS
20.0000 mg | ORAL_TABLET | Freq: Every day | ORAL | Status: DC
Start: 2013-12-31 — End: 2014-01-17

## 2014-01-13 ENCOUNTER — Other Ambulatory Visit: Payer: Self-pay | Admitting: *Deleted

## 2014-01-13 MED ORDER — METOPROLOL TARTRATE 50 MG PO TABS: 50.0000 mg | ORAL_TABLET | Freq: Two times a day (BID) | ORAL | Status: DC

## 2014-01-13 MED ORDER — SIMVASTATIN 40 MG PO TABS: 40.0000 mg | ORAL_TABLET | Freq: Every day | ORAL | Status: DC

## 2014-01-13 MED ORDER — MELOXICAM 15 MG PO TABS: 15.0000 mg | ORAL_TABLET | Freq: Every day | ORAL | Status: DC | PRN

## 2014-01-17 ENCOUNTER — Other Ambulatory Visit (INDEPENDENT_AMBULATORY_CARE_PROVIDER_SITE_OTHER): Payer: Commercial Managed Care - HMO

## 2014-01-17 ENCOUNTER — Encounter: Payer: Self-pay | Admitting: Internal Medicine

## 2014-01-17 ENCOUNTER — Other Ambulatory Visit: Payer: Self-pay | Admitting: Internal Medicine

## 2014-01-17 ENCOUNTER — Ambulatory Visit (INDEPENDENT_AMBULATORY_CARE_PROVIDER_SITE_OTHER): Payer: Commercial Managed Care - HMO | Admitting: Internal Medicine

## 2014-01-17 VITALS — BP 200/98 | HR 76 | Temp 98.0°F | Resp 16 | Wt 144.0 lb

## 2014-01-17 DIAGNOSIS — I10 Essential (primary) hypertension: Secondary | ICD-10-CM

## 2014-01-17 DIAGNOSIS — I2510 Atherosclerotic heart disease of native coronary artery without angina pectoris: Secondary | ICD-10-CM

## 2014-01-17 DIAGNOSIS — E785 Hyperlipidemia, unspecified: Secondary | ICD-10-CM

## 2014-01-17 DIAGNOSIS — I714 Abdominal aortic aneurysm, without rupture, unspecified: Secondary | ICD-10-CM

## 2014-01-17 DIAGNOSIS — F17200 Nicotine dependence, unspecified, uncomplicated: Secondary | ICD-10-CM

## 2014-01-17 LAB — BASIC METABOLIC PANEL
BUN: 20 mg/dL (ref 6–23)
CALCIUM: 8.9 mg/dL (ref 8.4–10.5)
CO2: 25 mEq/L (ref 19–32)
CREATININE: 0.9 mg/dL (ref 0.4–1.2)
Chloride: 107 mEq/L (ref 96–112)
GFR: 65.15 mL/min (ref >60.00)
Glucose, Bld: 87 mg/dL (ref 70–99)
Potassium: 4 mEq/L (ref 3.5–5.1)
Sodium: 141 mEq/L (ref 135–145)

## 2014-01-17 LAB — TSH: TSH: 4.57 u[IU]/mL — ABNORMAL HIGH (ref 0.35–4.50)

## 2014-01-17 LAB — HEPATIC FUNCTION PANEL
ALT: 15 U/L (ref 0–35)
AST: 22 U/L (ref 0–37)
Albumin: 3.5 g/dL (ref 3.5–5.2)
Alkaline Phosphatase: 46 U/L (ref 39–117)
BILIRUBIN TOTAL: 0.7 mg/dL (ref 0.2–1.2)
Bilirubin, Direct: 0.1 mg/dL (ref 0.0–0.3)
Total Protein: 6.3 g/dL (ref 6.0–8.3)

## 2014-01-17 LAB — LIPID PANEL
Cholesterol: 148 mg/dL (ref 0–200)
HDL: 38.1 mg/dL — ABNORMAL LOW (ref >39.00)
LDL CALC: 87 mg/dL (ref 0–99)
NONHDL: 109.9
Total CHOL/HDL Ratio: 4
Triglycerides: 116 mg/dL (ref 0.0–149.0)
VLDL: 23.2 mg/dL (ref 0.0–40.0)

## 2014-01-17 MED ORDER — LISINOPRIL 20 MG PO TABS: 20.0000 mg | ORAL_TABLET | Freq: Two times a day (BID) | ORAL | Status: DC

## 2014-01-17 NOTE — Assessment & Plan Note (Signed)
Chronic, refratory

## 2014-01-17 NOTE — Progress Notes (Deleted)
Pre visit review using our clinic review tool, if applicable. No additional management support is needed unless otherwise documented below in the visit note. 

## 2014-01-17 NOTE — Assessment & Plan Note (Signed)
Continue with current prescription therapy as reflected on the Med list.  

## 2014-01-17 NOTE — Assessment & Plan Note (Signed)
Not controlled today Increase ACE to BID

## 2014-01-17 NOTE — Assessment & Plan Note (Signed)
Patient reports having gone to a commercial health care screening company in June, 2014. She brings a printed report with an abdominal US that reveals a 4.5x4.5x5 cm abdominal aortic aneurysm. Positive family h/o for woman having AAA. Sch aorta US

## 2014-01-17 NOTE — Progress Notes (Addendum)
   Subjective:    HPI   C/o gout in B feet, OA, AS, AAA, CAD She is a smoker  BP Readings from Last 3 Encounters:  01/17/14 200/98  09/01/13 122/64  03/09/13 154/82   Wt Readings from Last 3 Encounters:  01/17/14 144 lb (65.318 kg)  09/01/13 146 lb (66.225 kg)  03/09/13 155 lb 6.4 oz (70.489 kg)      Review of Systems  Constitutional: Negative for chills, activity change, appetite change, fatigue and unexpected weight change.  HENT: Negative for congestion, mouth sores and sinus pressure.   Eyes: Negative for visual disturbance.  Respiratory: Negative for cough and chest tightness.   Gastrointestinal: Negative for nausea and abdominal pain.  Genitourinary: Positive for vaginal bleeding. Negative for frequency, difficulty urinating and vaginal pain.  Musculoskeletal: Negative for back pain and gait problem.  Skin: Negative for pallor and rash.  Neurological: Negative for dizziness, tremors, weakness, numbness and headaches.  Psychiatric/Behavioral: Negative for confusion and sleep disturbance. The patient is not nervous/anxious.        Objective:   Physical Exam  Constitutional: She appears well-developed. No distress.  HENT:  Head: Normocephalic.  Right Ear: External ear normal.  Left Ear: External ear normal.  Nose: Nose normal.  Mouth/Throat: Oropharynx is clear and moist.  Eyes: Conjunctivae are normal. Pupils are equal, round, and reactive to light. Right eye exhibits no discharge. Left eye exhibits no discharge.  Neck: Normal range of motion. Neck supple. No JVD present. No tracheal deviation present. No thyromegaly present.  Cardiovascular: Normal rate, regular rhythm and normal heart sounds.   Pulmonary/Chest: No stridor. No respiratory distress. She has no wheezes.  Abdominal: Soft. Bowel sounds are normal. She exhibits no distension and no mass. There is no tenderness. There is no rebound and no guarding.  Musculoskeletal: She exhibits no edema and no  tenderness.  Lymphadenopathy:    She has no cervical adenopathy.  Neurological: She displays normal reflexes. No cranial nerve deficit. She exhibits normal muscle tone. Coordination normal.  Skin: No rash noted. No erythema.  Psychiatric: She has a normal mood and affect. Her behavior is normal. Judgment and thought content normal.   Lab Results  Component Value Date   WBC 12.0* 03/15/2012   HGB 13.3 03/15/2012   HCT 39.8 03/15/2012   PLT 233 03/15/2012   GLUCOSE 109* 03/10/2013   CHOL 123 07/29/2011   TRIG 168* 03/10/2013   HDL 30* 03/10/2013   LDLDIRECT 76.7 11/17/2008   LDLCALC 66 03/10/2013   ALT 15 03/10/2013   AST 20 03/10/2013   NA 143 03/10/2013   K 4.4 03/10/2013   CL 105 03/10/2013   CREATININE 0.87 03/10/2013   BUN 19 03/10/2013   CO2 25 03/10/2013   TSH 4.407 03/15/2012          Assessment & Plan:

## 2014-01-20 ENCOUNTER — Telehealth: Payer: Self-pay | Admitting: *Deleted

## 2014-01-20 DIAGNOSIS — I714 Abdominal aortic aneurysm, without rupture, unspecified: Secondary | ICD-10-CM

## 2014-01-20 NOTE — Telephone Encounter (Signed)
Ok Thx 

## 2014-01-20 NOTE — Telephone Encounter (Signed)
Pt is requesting to have her Aorta US done at [facility] in [place]. Please place order if ok.

## 2014-01-21 NOTE — Telephone Encounter (Signed)
Pt has been notified will be done @ [place]...Tammy Brooks

## 2014-01-31 ENCOUNTER — Telehealth: Payer: Self-pay | Admitting: Internal Medicine

## 2014-01-31 DIAGNOSIS — I714 Abdominal aortic aneurysm, without rupture, unspecified: Secondary | ICD-10-CM

## 2014-01-31 NOTE — Telephone Encounter (Signed)
Aorta US 01/25/14 w/AAA 4.8 cm w/thrombus I called and left a VM  Pls call pt and inform of an aneurism. I'll sch a vasc surgery consult. Start a full ASA 325 mg/d. Do not smoke  To ER if abd pain  Thx

## 2014-02-01 NOTE — Telephone Encounter (Signed)
Pt return call back gave her md response below. Inform pt once vascular appt get set-up she will received call back with appt date, and time...Tammy Brooks

## 2014-02-04 ENCOUNTER — Telehealth: Payer: Self-pay | Admitting: Internal Medicine

## 2014-02-04 MED ORDER — LISINOPRIL 20 MG PO TABS: 20.0000 mg | ORAL_TABLET | Freq: Two times a day (BID) | ORAL | Status: DC

## 2014-02-04 NOTE — Telephone Encounter (Signed)
Patient states she needs [place] to send rx for lisinopril with new dosage to [facility]. It was previously sent to [facility].

## 2014-02-04 NOTE — Telephone Encounter (Signed)
Done

## 2014-02-28 ENCOUNTER — Encounter: Payer: Self-pay | Admitting: Vascular Surgery

## 2014-03-01 ENCOUNTER — Telehealth: Payer: Self-pay | Admitting: Vascular Surgery

## 2014-03-01 ENCOUNTER — Encounter: Payer: Self-pay | Admitting: Vascular Surgery

## 2014-03-01 ENCOUNTER — Ambulatory Visit (INDEPENDENT_AMBULATORY_CARE_PROVIDER_SITE_OTHER): Payer: Commercial Managed Care - HMO | Admitting: Vascular Surgery

## 2014-03-01 VITALS — BP 176/71 | HR 48 | Ht 63.5 in | Wt 143.5 lb

## 2014-03-01 DIAGNOSIS — I714 Abdominal aortic aneurysm, without rupture, unspecified: Secondary | ICD-10-CM

## 2014-03-01 NOTE — Progress Notes (Signed)
Patient name: Tammy Brooks MRN: [id number] DOB: November 05, 1933 Sex: female   Referred by: Plotnikov  Reason for referral:  Chief Complaint  Patient presents with  . New Evaluation    AAA    HISTORY OF PRESENT ILLNESS: The patient presents today for a session regarding recent finding of infrarenal abdominal aortic aneurysm. She underwent a [facility] screening and this did show 4-1/2 cm aneurysm. She subsequently underwent formal abdominal ultrasound the past [facility] and I have these results as well. This showed a 4.8 cm infrarenal abdominal aortic aneurysm and a 2.7 cm left common iliac artery aneurysm. The patient had no prior knowledge of this. Interestingly her mother died of a symptomatic aneurysm and her sister had an aneurysm as well. She does report remote history of myocardial infarction but reports she has been stable following this. No history of stroke and no history of peripheral vascular occlusive disease  Past Medical History  Diagnosis Date  . Arthritis   . Blind left eye   . Heart murmur   . Hyperlipidemia   . Hypertension   . Myocardial infarction     mild/ age 33  . Stroke 2001    no paralysis  . Ulcer   . CAD (coronary artery disease) 08/1995    50-55% second diag dz.; occluded OM  . PAD (peripheral artery disease)     mild to mod Rt ICA stenosis followed by doppler-02/05/12  . Echocardiogram abnormal 03/2012    mild to mod AS, valve area1.58 cm2, mild concentric LVH, grade I diastolic dusfunction  . Tobacco abuse     Past Surgical History  Procedure Laterality Date  . Cataract extraction w/ intraocular lens  implant, bilateral    . Eye surgery      lense implant removed/ blind in left eye  . Upper gastrointestinal endoscopy      treated for h-pylori  . Colonoscopy    . Polypectomy    . Tubal ligation    . Appendectomy    . Tonsillectomy and adenoidectomy    . Abdominal hysterectomy    . Cardiac catheterization  08/1995    50-55% second diag  lesion, occluded OM    History   Social History  . Marital Status: Widowed    Spouse Name: N/A    Number of Children: 62  . Years of Education: 9   Occupational History  . Oncologist     retired   Social History Main Topics  . Smoking status: Current Every Day Smoker -- 1.00 packs/day for 62 years    Types: Cigarettes  . Smokeless tobacco: Never Used  . Alcohol Use: 0.0 oz/week     Comment: occasionally - few drinks/month  . Drug Use: No  . Sexual Activity: Not Currently   Other Topics Concern  . Not on file   Social History Narrative   9th grade. married '54- widowed Dec 10th'08. 4 sons - '55, '58, '60, '69; 1 daughter - '63; 12 grandchildren; 9 great-grandchildren. 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 Other children are close and attentive. ACP/End-of-life: No CPR, no mechanical ventilation, no futile/heroic measures.    Family History  Problem Relation Age of Onset  . Aneurysm Mother     AAA  . Hypertension Mother   . Heart disease Father     CAD, MI x 3  . Heart attack Father   . Cancer Maternal Aunt  breast cancer    Allergies as of 03/01/2014 - Review Complete 03/01/2014  Allergen Reaction Noted  . Asa [aspirin]  03/03/2013  . Nabumetone Other (See Comments) 11/29/2010    Current Outpatient Prescriptions on File Prior to Visit  Medication Sig Dispense Refill  . allopurinol (ZYLOPRIM) 100 MG tablet Take 1 tablet (100 mg total) by mouth daily.  90 tablet  3  . aspirin 81 MG tablet Take 81 mg by mouth daily.        . cholecalciferol (VITAMIN D) 1000 UNITS tablet Take 1 tablet (1,000 Units total) by mouth daily.  100 tablet  3  . colchicine 0.6 MG tablet Take 1 tablet (0.6 mg total) by mouth 4 (four) times daily as needed.  60 tablet  3  . diltiazem (CARDIZEM) 120 MG tablet Take 1 tablet (120 mg total) by mouth daily.  90 tablet  3  . dorzolamide-timolol (COSOPT) 22.3-6.8 MG/ML ophthalmic solution Place 1  drop into the left eye daily.        . furosemide (LASIX) 20 MG tablet Take 1 tablet (20 mg total) by mouth daily as needed for edema.  30 tablet  3  . lisinopril (PRINIVIL,ZESTRIL) 20 MG tablet Take 1 tablet (20 mg total) by mouth 2 (two) times daily.  180 tablet  3  . meloxicam (MOBIC) 15 MG tablet Take 1 tablet (15 mg total) by mouth daily as needed for pain.  90 tablet  1  . metoprolol (LOPRESSOR) 50 MG tablet Take 1 tablet (50 mg total) by mouth 2 (two) times daily.  180 tablet  3  . Polyethylene Glycol 3350 POWD 1 CAPFUL OF POWDER MIXED IN 8 0Z OF WATER 2 X DAY      . potassium chloride (K-DUR) 10 MEQ tablet Take 1 tablet (10 mEq total) by mouth daily as needed (take only if you take furosemide for swelling).  30 tablet  3  . simvastatin (ZOCOR) 40 MG tablet Take 1 tablet (40 mg total) by mouth at bedtime.  90 tablet  3   No current facility-administered medications on file prior to visit.     REVIEW OF SYSTEMS:  Positives indicated with an "X"  CARDIOVASCULAR:  [ ]  chest pain   [ ]  chest pressure   [ ]  palpitations   [ ]  orthopnea   [ ]  dyspnea on exertion   [ ]  claudication   [ ]  rest pain   [ ]  DVT   [ ]  phlebitis PULMONARY:   [ ]  productive cough   [ ]  asthma   [ ]  wheezing NEUROLOGIC:   [ ]  weakness  [ ]  paresthesias  [ ]  aphasia  [ ]  amaurosis  [ ]  dizziness HEMATOLOGIC:   [ ]  bleeding problems   [ ]  clotting disorders MUSCULOSKELETAL:  [ ]  joint pain   [ ]  joint swelling GASTROINTESTINAL: [ ]   blood in stool  [ ]   hematemesis GENITOURINARY:  [ ]   dysuria  [ ]   hematuria PSYCHIATRIC:  [ ]  history of major depression INTEGUMENTARY:  [ ]  rashes  [ ]  ulcers CONSTITUTIONAL:  [ ]  fever   [ ]  chills  PHYSICAL EXAMINATION:  General: The patient is a well-nourished female, in no acute distress. Vital signs are BP 176/71  Pulse 48  Ht 5' 3.5" (1.613 m)  Wt 143 lb 8 oz (65.091 kg)  BMI 25.02 kg/m2  SpO2 97% Pulmonary: There is a good air exchange bilaterally without wheezing  or rales. Abdomen: Soft and  non-tender with normal pitch bowel sounds. She does have an easily palpable abdominal aortic aneurysm. This is nontender. Musculoskeletal: There are no major deformities.  There is no significant extremity pain. Neurologic: No focal weakness or paresthesias are detected, Skin: There are no ulcer or rashes noted. Psychiatric: The patient has normal affect. Cardiovascular: There is a regular rate and rhythm without significant murmur appreciated. 2+ palpable radial femoral popliteal and dorsalis pedis pulses. No evidence of peripheral aneurysm.    Impression and Plan:  Asymptomatic infrarenal abdominal aortic aneurysm and left common iliac artery aneurysm. I discussed the significance of with the patient and her family present. I did recommend CT angiogram for further evaluation of this. I did explain to critical threshold for repair would be 5-5.5 cm. Shops he is quite concerned with her prior history of her mother. Will obtain outpatient CT angiogram in the next 1-2 weeks and we'll see her back in the office for further discussion of this    Manahil Vanzile [facility] [place] Office: [phone]

## 2014-03-01 NOTE — Telephone Encounter (Signed)
Gave patient the following information: CTA on 03/07/14 at 11:30 am at [facility]. No solid foods 2 hrs prior, only clear liquids. Follow up with Dr. Donnetta Hutching on 03/15/14 at 2pm. Pt verbalized understanding.

## 2014-03-01 NOTE — Addendum Note (Signed)
Addended by: Mena Goes on: 03/01/2014 04:58 PM   Modules accepted: Orders

## 2014-03-14 ENCOUNTER — Encounter: Payer: Self-pay | Admitting: Vascular Surgery

## 2014-03-15 ENCOUNTER — Encounter: Payer: Self-pay | Admitting: Vascular Surgery

## 2014-03-15 ENCOUNTER — Ambulatory Visit (INDEPENDENT_AMBULATORY_CARE_PROVIDER_SITE_OTHER): Payer: Commercial Managed Care - HMO | Admitting: Vascular Surgery

## 2014-03-15 VITALS — BP 171/75 | HR 57 | Resp 18 | Ht 62.0 in | Wt 144.0 lb

## 2014-03-15 DIAGNOSIS — I714 Abdominal aortic aneurysm, without rupture, unspecified: Secondary | ICD-10-CM

## 2014-03-15 NOTE — Progress Notes (Signed)
Patient name: Tammy Brooks MRN: [id number] DOB: 1934/04/14 Sex: female   Referred by: Plotnikov  Reason for referral:  Chief Complaint  Patient presents with  . Follow-up    2 week FU  CTA of abd?pelvis done 03-07-2014 at  [facility]    . AAA    HISTORY OF PRESENT ILLNESS: The patient returns today for continued discussion of her aortic and iliac artery aneurysm. She had a CT scan of [facility] and is here today for discussion of this. She has no symptoms referable to her aneurysm. She does have a history of prior vaginal hysterectomy and tubal ligation. There is no history of ovarian problems.  Past Medical History  Diagnosis Date  . Arthritis   . Blind left eye   . Heart murmur   . Hyperlipidemia   . Hypertension   . Myocardial infarction     mild/ age 61  . Stroke 2001    no paralysis  . Ulcer   . CAD (coronary artery disease) 08/1995    50-55% second diag dz.; occluded OM  . PAD (peripheral artery disease)     mild to mod Rt ICA stenosis followed by doppler-02/05/12  . Echocardiogram abnormal 03/2012    mild to mod AS, valve area1.58 cm2, mild concentric LVH, grade I diastolic dusfunction  . Tobacco abuse     Past Surgical History  Procedure Laterality Date  . Cataract extraction w/ intraocular lens  implant, bilateral    . Eye surgery      lense implant removed/ blind in left eye  . Upper gastrointestinal endoscopy      treated for h-pylori  . Colonoscopy    . Polypectomy    . Tubal ligation    . Appendectomy    . Tonsillectomy and adenoidectomy    . Abdominal hysterectomy    . Cardiac catheterization  08/1995    50-55% second diag lesion, occluded OM    History   Social History  . Marital Status: Widowed    Spouse Name: N/A    Number of Children: 45  . Years of Education: 9   Occupational History  . Oncologist     retired   Social History Main Topics  . Smoking status: Current Every Day Smoker -- 1.00 packs/day for 62 years   Types: Cigarettes  . Smokeless tobacco: Never Used  . Alcohol Use: 0.0 oz/week     Comment: occasionally - few drinks/month  . Drug Use: No  . Sexual Activity: Not Currently   Other Topics Concern  . Not on file   Social History Narrative   9th grade. married '54- widowed Dec 10th'08. 4 sons - '55, '58, '60, '69; 1 daughter - '63; 12 grandchildren; 9 great-grandchildren. 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 Other children are close and attentive. ACP/End-of-life: No CPR, no mechanical ventilation, no futile/heroic measures.    Family History  Problem Relation Age of Onset  . Aneurysm Mother     AAA  . Hypertension Mother   . Heart disease Father     CAD, MI x 3  . Heart attack Father   . Cancer Maternal Aunt     breast cancer    Allergies as of 03/15/2014 - Review Complete 03/15/2014  Allergen Reaction Noted  . Asa [aspirin]  03/03/2013  . Nabumetone Other (See Comments) 11/29/2010    Current Outpatient Prescriptions on File Prior to Visit  Medication Sig Dispense Refill  . allopurinol (ZYLOPRIM) 100 MG tablet Take 1 tablet (100 mg total) by mouth daily.  90 tablet  3  . aspirin 81 MG tablet Take 81 mg by mouth daily.        . cholecalciferol (VITAMIN D) 1000 UNITS tablet Take 1 tablet (1,000 Units total) by mouth daily.  100 tablet  3  . colchicine 0.6 MG tablet Take 1 tablet (0.6 mg total) by mouth 4 (four) times daily as needed.  60 tablet  3  . diltiazem (CARDIZEM) 120 MG tablet Take 1 tablet (120 mg total) by mouth daily.  90 tablet  3  . dorzolamide-timolol (COSOPT) 22.3-6.8 MG/ML ophthalmic solution Place 1 drop into the left eye daily.        . furosemide (LASIX) 20 MG tablet Take 1 tablet (20 mg total) by mouth daily as needed for edema.  30 tablet  3  . lisinopril (PRINIVIL,ZESTRIL) 20 MG tablet Take 1 tablet (20 mg total) by mouth 2 (two) times daily.  180 tablet  3  . meloxicam (MOBIC) 15 MG tablet Take 1 tablet (15  mg total) by mouth daily as needed for pain.  90 tablet  1  . metoprolol (LOPRESSOR) 50 MG tablet Take 1 tablet (50 mg total) by mouth 2 (two) times daily.  180 tablet  3  . Polyethylene Glycol 3350 POWD 1 CAPFUL OF POWDER MIXED IN 8 0Z OF WATER 2 X DAY      . potassium chloride (K-DUR) 10 MEQ tablet Take 1 tablet (10 mEq total) by mouth daily as needed (take only if you take furosemide for swelling).  30 tablet  3  . simvastatin (ZOCOR) 40 MG tablet Take 1 tablet (40 mg total) by mouth at bedtime.  90 tablet  3   No current facility-administered medications on file prior to visit.      PHYSICAL EXAMINATION:  General: The patient is a well-nourished female, in no acute distress. Vital signs are BP 171/75  Pulse 57  Resp 18  Ht 5\' 2"  (1.575 m)  Wt 144 lb (65.318 kg)  BMI 26.33 kg/m2 Pulmonary: There is a good air exchange   Musculoskeletal: There are no major deformities.  Neurologic: No focal weakness or paresthesias are detected, Skin: There are no ulcer or rashes noted. Psychiatric: The patient has normal affect.    I reviewed her CT with the patient and her daughter-in-law present. This shows a 4.5 cm infrarenal abdominal aortic aneurysm 2.5 cm iliac artery aneurysm. There is also a new finding of a 9 cm pelvic mass. Radiologist interpretation is concerning for ovarian malignancy.     Impression and Plan:  Had a long discussion with the patient and her daughter-in-law present. Explained that we are comfortable with continued observation and a 6 month ultrasound followup of her aortic aneurysm and iliac artery aneurysm. I did explain the finding of this 9 cm pelvic mass with concern regarding ovarian malignancy. She does not have a GYN specialist. I discussed this by telephone with her internist Dr Alain Marion, who will coordinate referral. We will see her in 6 months with repeat ultrasound of her aorta and iliac aneurysm    Zyden Suman [facility] Office: [phone]

## 2014-03-15 NOTE — Addendum Note (Signed)
Addended by: Mena Goes on: 03/15/2014 03:57 PM   Modules accepted: Orders

## 2014-03-16 ENCOUNTER — Telehealth: Payer: Self-pay | Admitting: Internal Medicine

## 2014-03-16 DIAGNOSIS — R1900 Intra-abdominal and pelvic swelling, mass and lump, unspecified site: Secondary | ICD-10-CM

## 2014-03-16 MED ORDER — SIMVASTATIN 40 MG PO TABS: 40.0000 mg | ORAL_TABLET | Freq: Every day | ORAL | Status: DC

## 2014-03-16 NOTE — Telephone Encounter (Signed)
Notified pt rx has been sent to [facility]...Tammy Brooks

## 2014-03-16 NOTE — Telephone Encounter (Signed)
Dr Early called re: CT with the patient and her daughter-in-law present. This shows a 4.5 cm infrarenal abdominal aortic aneurysm 2.5 cm iliac artery aneurysm. There is also a new finding of a 9 cm pelvic mass. Radiologist interpretation is concerning for ovarian malignancy.  I'll sch a GYN ref

## 2014-03-16 NOTE — Telephone Encounter (Signed)
Patient need a new prescription simvastatin sent to [facility], do it by fax # [phone] phone # [phone]

## 2014-03-17 ENCOUNTER — Encounter: Payer: Self-pay | Admitting: Vascular Surgery

## 2014-04-08 ENCOUNTER — Ambulatory Visit: Payer: Medicare HMO | Attending: Gynecology | Admitting: Gynecology

## 2014-04-08 ENCOUNTER — Encounter: Payer: Self-pay | Admitting: Gynecology

## 2014-04-08 VITALS — BP 172/66 | HR 53 | Temp 97.9°F | Resp 16 | Ht 62.0 in | Wt 142.9 lb

## 2014-04-08 DIAGNOSIS — E785 Hyperlipidemia, unspecified: Secondary | ICD-10-CM | POA: Insufficient documentation

## 2014-04-08 DIAGNOSIS — I714 Abdominal aortic aneurysm, without rupture: Secondary | ICD-10-CM | POA: Diagnosis not present

## 2014-04-08 DIAGNOSIS — I719 Aortic aneurysm of unspecified site, without rupture: Secondary | ICD-10-CM

## 2014-04-08 DIAGNOSIS — I739 Peripheral vascular disease, unspecified: Secondary | ICD-10-CM | POA: Insufficient documentation

## 2014-04-08 DIAGNOSIS — I10 Essential (primary) hypertension: Secondary | ICD-10-CM | POA: Insufficient documentation

## 2014-04-08 DIAGNOSIS — I999 Unspecified disorder of circulatory system: Secondary | ICD-10-CM | POA: Diagnosis not present

## 2014-04-08 DIAGNOSIS — Z79899 Other long term (current) drug therapy: Secondary | ICD-10-CM | POA: Insufficient documentation

## 2014-04-08 DIAGNOSIS — I252 Old myocardial infarction: Secondary | ICD-10-CM | POA: Insufficient documentation

## 2014-04-08 DIAGNOSIS — R011 Cardiac murmur, unspecified: Secondary | ICD-10-CM | POA: Diagnosis not present

## 2014-04-08 DIAGNOSIS — M1990 Unspecified osteoarthritis, unspecified site: Secondary | ICD-10-CM | POA: Insufficient documentation

## 2014-04-08 DIAGNOSIS — Z90710 Acquired absence of both cervix and uterus: Secondary | ICD-10-CM | POA: Diagnosis not present

## 2014-04-08 DIAGNOSIS — R1900 Intra-abdominal and pelvic swelling, mass and lump, unspecified site: Secondary | ICD-10-CM | POA: Insufficient documentation

## 2014-04-08 DIAGNOSIS — H5442 Blindness, left eye, normal vision right eye: Secondary | ICD-10-CM | POA: Diagnosis not present

## 2014-04-08 DIAGNOSIS — Z803 Family history of malignant neoplasm of breast: Secondary | ICD-10-CM | POA: Diagnosis not present

## 2014-04-08 DIAGNOSIS — R1909 Other intra-abdominal and pelvic swelling, mass and lump: Secondary | ICD-10-CM

## 2014-04-08 DIAGNOSIS — F17210 Nicotine dependence, cigarettes, uncomplicated: Secondary | ICD-10-CM | POA: Insufficient documentation

## 2014-04-08 DIAGNOSIS — Z7982 Long term (current) use of aspirin: Secondary | ICD-10-CM | POA: Diagnosis not present

## 2014-04-08 DIAGNOSIS — Z8249 Family history of ischemic heart disease and other diseases of the circulatory system: Secondary | ICD-10-CM | POA: Insufficient documentation

## 2014-04-08 DIAGNOSIS — Z8673 Personal history of transient ischemic attack (TIA), and cerebral infarction without residual deficits: Secondary | ICD-10-CM | POA: Insufficient documentation

## 2014-04-08 DIAGNOSIS — I2510 Atherosclerotic heart disease of native coronary artery without angina pectoris: Secondary | ICD-10-CM | POA: Diagnosis not present

## 2014-04-08 NOTE — Patient Instructions (Signed)
Your surgery is scheduled for April 26, 2014 with Dr. Fermin Schwab     Preparing for your Surgery  Pre-operative Testing -You will receive a phone call from presurgical testing at [facility] to arrange for a pre-operative testing appointment before your surgery.  This appointment normally occurs one to two weeks before your scheduled surgery.   -Bring your insurance card, copy of an advanced directive if applicable, medication list  -At that visit, you will be asked to sign a consent for a possible blood transfusion in case a transfusion becomes necessary during surgery.  The need for a blood transfusion is rare but having consent is a necessary part of your care.     Day Before Surgery at [place] will be asked to take in only clear liquids the day before surgery.  Examples of clear liquids include broths, jello, and clear juices.  You will need to perform an enema,( bowel prep ) the night before your surgery based .  You will be advised to have nothing to eat or drink after midnight the evening before.    Your role in recovery Your role is to become active as soon as directed by your doctor, while still giving yourself time to heal.  Rest when you feel tired. You will be asked to do the following in order to speed your recovery:  - Cough and breathe deeply. This helps toclear and expand your lungs and can prevent pneumonia. You may be given a spirometer to practice deep breathing. A staff member will show you how to use the spirometer. - Do mild physical activity. Walking or moving your legs help your circulation and body functions return to normal. A staff member will help you when you try to walk and will provide you with simple exercises. Do not try to get up or walk alone the first time. - Actively manage your pain. Managing your pain lets you move in comfort. We will ask you to rate your pain on a scale of zero to 10. It is your responsibility to tell your doctor or nurse  where and how much you hurt so your pain can be treated.  Special Considerations -If you are diabetic, you may be placed on insulin after surgery to have closer control over your blood sugars to promote healing and recovery.  This does not mean that you will be discharged on insulin.  If applicable, your oral antidiabetics will be resumed when you are tolerating a solid diet.  -Your final pathology results from surgery should be available by the Friday after surgery and the results will be relayed to you when available.   Unilateral Salpingo-Oophorectomy Unilateral salpingo-oophorectomy is the surgical removal of one fallopian tube and ovary. The ovaries are small organs that produce eggs in women. The fallopian tubes transport the egg from the ovary to the womb (uterus). A unilateral salpingo-oophorectomy may be done for various reasons, including:  Infection in the fallopian tube and ovary.  Scar tissue in the fallopian tube and ovary (adhesions).  A cyst or tumor on the ovary.  A need to remove the fallopian tube and ovary when removing the uterus.  Cancer of the fallopian tube or ovary. The removal of one fallopian tube and ovary will not prevent you from becoming pregnant, put you into menopause, or cause problems with your menstrual periods or sex drive. LET [facility] CARE PROVIDER KNOW ABOUT:  Any allergies you have.  All medicines you are taking, including vitamins, herbs, eye  drops, creams, and over-the-counter medicines.  Previous problems you or members of your family have had with the use of anesthetics.  Any blood disorders you have.  Previous surgeries you have had.  Medical conditions you have. RISKS AND COMPLICATIONS  Generally, this is a safe procedure. However, as with any procedure, complications can occur. Possible complications include:  Injury to surrounding organs.  Bleeding.  Infection.  Blood clots in the legs or lungs.  Problems related to  anesthesia. BEFORE THE PROCEDURE  Ask your health care provider about changing or stopping your regular medicines. You may need to stop taking certain medicines, such as aspirin or blood thinners, at least 1 week before the surgery.  Do not eat or drink anything for at least 8 hours before the surgery.  If you smoke, do not smoke for at least 2 weeks before the surgery.  Make plans to have someone drive you home after the procedure or after your hospital stay. Also arrange for someone to help you with activities during recovery. PROCEDURE  You will be given medicine to help you relax before the procedure (sedative). You will then be given medicine to make you sleep through the procedure (general anesthetic). These medicines will be given through an IV access tube that is put into one of your veins.  Once you are asleep, your lower abdomen will be shaved and cleaned. A thin, flexible tube (catheter) will be placed in your bladder.  The surgeon may use a laparoscopic, robotic, or open technique for this surgery:  In the laparoscopic technique, the surgery is done through two small cuts (incisions) in the abdomen. A thin, lighted tube with a tiny camera on the end (laparoscope) is inserted into one of the incisions. The tools needed for the procedure are put through the other incision.  A robotic technique may be chosen to perform complex surgery in a small space. In the robotic technique, small incisions are made. A camera and surgical instruments are passed through the incisions. Surgical instruments are controlled with the help of a robotic arm.  In the open technique, the surgery is done through one large incision in the abdomen.  Using any of these techniques, the surgeon will remove the fallopian tube and ovary. The blood vessels will be clamped and tied.  The surgeon will then use staples or stitches to close the incision or incisions. AFTER THE PROCEDURE  You will be taken to a  recovery area where your progress will be monitored for 1-3 hours. Your blood pressure, pulse, and temperature will be checked often. You will remain in the recovery area until you are stable and waking up.  If the laparoscopic technique was used, you may be allowed to go home after several hours. You may have some shoulder pain. This is normal and usually goes away in a day or two.  If the open technique was used, you will be admitted to the hospital for a couple of days.  You will be given pain medicine as necessary.  The IV tube and catheter will be removed before you are discharged. Document Released: 03/17/2009 Document Revised: 05/25/2013 Document Reviewed: 11/11/2012 [facility] Patient Information 2015 [place], [place]. This information is not intended to replace advice given to you by your health care provider. Make sure you discuss any questions you have with your health care provider.

## 2014-04-08 NOTE — Progress Notes (Signed)
Consult Note: Gyn-Onc   Tammy Brooks 80 y.o. female  No chief complaint on file.   Assessment :complex right adnexal mass.  Plan:the differential diagnosis of the pelvic masses discussed with the patient and her sister-in-law. I recommend she undergo exploratory laparotomy with resection of the mass and intraoperative frozen section to guide any further therapy. I reviewed the patient's CTs findings as well as CA-125 report.  Surgery is scheduled for 03/26/2014. We will seek cardiology consultation with Dr. Gwenlyn Found prior to surgery.  The risks of surgery including hemorrhage infection, injury to adjacent viscera, anesthetic risks, and thromboembolic palpitations were reviewed. Patient also understands she is at higher risk based on her underlying vascular disease.   HPI:  80 year old white female seen in consultation at the request of Dr. Aloha Gell for evaluation of a newly diagnosed complex right pelvic mass.  The patient underwent a CT scan to assess an abdominal aortic aneurysm on 03/07/2014. An incidental finding was made of a 9 cm solid and cystic right adnexal mass. CT did not show any evidence of adenopathy, ascites, or any other evidence of metastatic disease. CA-125 value is 8.1 units per mL. The patient has no past gynecologic history except for undergoing a vaginal hysterectomy and previous tubal ligation. She denies any family history of ovarian cancer.  Patient denies any pelvic pain pressure or any other GI or GU symptoms.  She does have significant medical comorbidities especially vascular. The aortic aneurysm measures 4.1 x 4.3 cm with extensive mural thrombus throughout the aneurysm.  Review of Systems:10 point review of systems is negative except as noted in interval history.   Vitals: Blood pressure 172/66, pulse 53, temperature 97.9 F (36.6 C), temperature source Oral, resp. rate 16, height 5\' 2"  (1.575 m), weight 142 lb 14.4 oz (64.819 kg).  Physical  Exam: General : The patient is a healthy woman in no acute distress.  HEENT: normocephalic, extraoccular movements normal; neck is supple without thyromegally  Lynphnodes: Supraclavicular and inguinal nodes not enlarged  Abdomen: Soft, non-tender, no ascites, no organomegally, no masses, no hernias  Pelvic:  EGBUS: Normal female  Vagina: Normal, no lesions  Urethra and Bladder: Normal, non-tender  Cervix: Surgically absent  Uterus: Surgically absent  Bi-manual examination:nodular pelvic mass extending from the right to the midline. This is nontender and mobile.  Rectal: normal sphincter tone, no masses, no blood  Lower extremities: No edema or varicosities. Normal range of motion      Allergies  Allergen Reactions  . Asa [Aspirin]     Stomach ulcers  . Nabumetone Other (See Comments)    Causes inflammation    Past Medical History  Diagnosis Date  . Arthritis   . Blind left eye   . Heart murmur   . Hyperlipidemia   . Hypertension   . Myocardial infarction     mild/ age 7  . Stroke 2001    no paralysis  . Ulcer   . CAD (coronary artery disease) 08/1995    50-55% second diag dz.; occluded OM  . PAD (peripheral artery disease)     mild to mod Rt ICA stenosis followed by doppler-02/05/12  . Echocardiogram abnormal 03/2012    mild to mod AS, valve area1.58 cm2, mild concentric LVH, grade I diastolic dusfunction  . Tobacco abuse     Past Surgical History  Procedure Laterality Date  . Cataract extraction w/ intraocular lens  implant, bilateral    . Eye surgery      lense implant  removed/ blind in left eye  . Upper gastrointestinal endoscopy      treated for h-pylori  . Colonoscopy    . Polypectomy    . Tubal ligation    . Appendectomy    . Tonsillectomy and adenoidectomy    . Abdominal hysterectomy    . Cardiac catheterization  08/1995    50-55% second diag lesion, occluded OM    Current Outpatient Prescriptions  Medication Sig Dispense Refill  . allopurinol  (ZYLOPRIM) 100 MG tablet Take 1 tablet (100 mg total) by mouth daily. 90 tablet 3  . aspirin 81 MG tablet Take 81 mg by mouth daily.      . colchicine 0.6 MG tablet Take 1 tablet (0.6 mg total) by mouth 4 (four) times daily as needed. 60 tablet 3  . diltiazem (CARDIZEM) 120 MG tablet Take 1 tablet (120 mg total) by mouth daily. 90 tablet 3  . dorzolamide-timolol (COSOPT) 22.3-6.8 MG/ML ophthalmic solution Place 1 drop into the left eye daily.      Marland Kitchen lisinopril (PRINIVIL,ZESTRIL) 20 MG tablet Take 1 tablet (20 mg total) by mouth 2 (two) times daily. 180 tablet 3  . meloxicam (MOBIC) 15 MG tablet Take 1 tablet (15 mg total) by mouth daily as needed for pain. 90 tablet 1  . metoprolol (LOPRESSOR) 50 MG tablet Take 1 tablet (50 mg total) by mouth 2 (two) times daily. 180 tablet 3  . Polyethylene Glycol 3350 POWD 1 CAPFUL OF POWDER MIXED IN 8 0Z OF WATER 2 X DAY    . simvastatin (ZOCOR) 40 MG tablet Take 1 tablet (40 mg total) by mouth at bedtime. 90 tablet 3  . terconazole (TERAZOL 3) 0.8 % vaginal cream   0   No current facility-administered medications for this visit.    History   Social History  . Marital Status: Widowed    Spouse Name: N/A    Number of Children: 74  . Years of Education: 9   Occupational History  . Oncologist     retired   Social History Main Topics  . Smoking status: Current Every Day Smoker -- 1.00 packs/day for 62 years    Types: Cigarettes  . Smokeless tobacco: Never Used  . Alcohol Use: 0.0 oz/week     Comment: occasionally - few drinks/month  . Drug Use: No  . Sexual Activity: Not Currently   Other Topics Concern  . Not on file   Social History Narrative   9th grade. married '54- widowed Dec 10th'08. 4 sons - '55, '58, '60, '69; 1 daughter - '63; 12 grandchildren; 9 great-grandchildren. 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 Other children are close and attentive. ACP/End-of-life: No CPR, no mechanical  ventilation, no futile/heroic measures.    Family History  Problem Relation Age of Onset  . Aneurysm Mother     AAA  . Hypertension Mother   . Heart disease Father     CAD, MI x 3  . Heart attack Father   . Cancer Maternal Aunt     breast cancer      CLARKE-PEARSON,Sohrab Keelan L, MD 04/08/2014, 1:53 PM

## 2014-04-13 ENCOUNTER — Ambulatory Visit (INDEPENDENT_AMBULATORY_CARE_PROVIDER_SITE_OTHER): Payer: Commercial Managed Care - HMO | Admitting: *Deleted

## 2014-04-13 ENCOUNTER — Ambulatory Visit (INDEPENDENT_AMBULATORY_CARE_PROVIDER_SITE_OTHER): Payer: Commercial Managed Care - HMO | Admitting: Cardiovascular Disease

## 2014-04-13 ENCOUNTER — Telehealth: Payer: Self-pay | Admitting: Internal Medicine

## 2014-04-13 ENCOUNTER — Encounter: Payer: Self-pay | Admitting: Cardiovascular Disease

## 2014-04-13 VITALS — BP 178/92 | HR 63 | Ht 62.5 in | Wt 140.3 lb

## 2014-04-13 DIAGNOSIS — Z8679 Personal history of other diseases of the circulatory system: Secondary | ICD-10-CM

## 2014-04-13 DIAGNOSIS — Z23 Encounter for immunization: Secondary | ICD-10-CM

## 2014-04-13 DIAGNOSIS — I350 Nonrheumatic aortic (valve) stenosis: Secondary | ICD-10-CM

## 2014-04-13 DIAGNOSIS — I2510 Atherosclerotic heart disease of native coronary artery without angina pectoris: Secondary | ICD-10-CM

## 2014-04-13 DIAGNOSIS — I10 Essential (primary) hypertension: Secondary | ICD-10-CM

## 2014-04-13 DIAGNOSIS — Z01818 Encounter for other preprocedural examination: Secondary | ICD-10-CM

## 2014-04-13 DIAGNOSIS — I739 Peripheral vascular disease, unspecified: Secondary | ICD-10-CM

## 2014-04-13 NOTE — Assessment & Plan Note (Signed)
History of moderate right internal carotid artery stenosis by Ultrasound. She does have bilateral carotid bruits. I'm going to repeat carotid Doppler studies.

## 2014-04-13 NOTE — Patient Instructions (Signed)
  We will see you back in follow up in 1 year with [name].  [name] has ordered: 1.  Echocardiogram. Echocardiography is a painless test that uses sound waves to create images of your heart. It provides your doctor with information about the size and shape of your heart and how well your heart's chambers and valves are working. This procedure takes approximately one hour. There are no restrictions for this procedure.   2. Carotid Duplex- This test is an ultrasound of the carotid arteries in your neck. It looks at blood flow through these arteries that supply the brain with blood. Allow one hour for this exam. There are no restrictions or special instructions.  3. Lexiscan Myoview- this is a test that looks at the blood flow to your heart muscle.  It takes approximately 2 1/2 hours. Please follow instruction sheet, as given.

## 2014-04-13 NOTE — Assessment & Plan Note (Addendum)
History of mild to moderate aortic stenosis with a valve area of 1.6 cm squared normal LV function. She does have a significant outflow tract murmur. I'm going to repeat a 2-D echocardiogram to assess her aortic stenosis

## 2014-04-13 NOTE — Assessment & Plan Note (Signed)
Hyperlipidemia on simvastatin followed by her primary care physician

## 2014-04-13 NOTE — Assessment & Plan Note (Signed)
History of noncritical CAD at cath in 1997. Her last Myoview performed in 2009 was nonischemic and she denies chest pain.

## 2014-04-13 NOTE — Assessment & Plan Note (Signed)
Her blood pressure is elevated today at 178/92. She is on lisinopril, metoprolol and diltiazem. No change in medications at this time

## 2014-04-13 NOTE — Progress Notes (Signed)
04/13/2014 Tammy Brooks   [birth year]  [id number]  Primary Physician Walker Kehr, MD Primary Cardiologist: Lorretta Harp MD Renae Gloss   HPI:  Tammy Brooks is a 80 year old mildly overweight widowed Caucasian female mother of 22, grandmother of 3 grandchildren well last saw a year and a half ago. She has a history of mild CAD by cath in 1997. Her lipid problems include continued tobacco abuse one pack per day, hypertension, and hyperlipidemia all medically treated. She does have mild aortic stenosis with valve area of 1.6 cm by 2-D echocardiogram 2 years ago. She also has moderate right internal carotid artery stenosis by duplex ultrasound. She was recently diagnosed with a small abdominal aortic and iliac aneurysm followed by Dr. Donnetta Hutching. She denies chest pain or shortness of breath. She was found to have a pelvic mass which needs to be surgically excised within the next week or 2.   Current Outpatient Prescriptions  Medication Sig Dispense Refill  . allopurinol (ZYLOPRIM) 100 MG tablet Take 1 tablet (100 mg total) by mouth daily. 90 tablet 3  . colchicine 0.6 MG tablet Take 1 tablet (0.6 mg total) by mouth 4 (four) times daily as needed. 60 tablet 3  . diltiazem (CARDIZEM) 120 MG tablet Take 1 tablet (120 mg total) by mouth daily. 90 tablet 3  . dorzolamide-timolol (COSOPT) 22.3-6.8 MG/ML ophthalmic solution Place 1 drop into the left eye daily.      Marland Kitchen levofloxacin (LEVAQUIN) 500 MG tablet Take 500 mg by mouth daily.    Marland Kitchen lisinopril (PRINIVIL,ZESTRIL) 20 MG tablet Take 1 tablet (20 mg total) by mouth 2 (two) times daily. 180 tablet 3  . meloxicam (MOBIC) 15 MG tablet Take 1 tablet (15 mg total) by mouth daily as needed for pain. 90 tablet 1  . metoprolol (LOPRESSOR) 50 MG tablet Take 1 tablet (50 mg total) by mouth 2 (two) times daily. 180 tablet 3  . Polyethylene Glycol 3350 POWD 1 CAPFUL OF POWDER MIXED IN 8 0Z OF WATER 2 X DAY    . simvastatin (ZOCOR) 40 MG tablet Take 1  tablet (40 mg total) by mouth at bedtime. 90 tablet 3  . terconazole (TERAZOL 3) 0.8 % vaginal cream Place 1 applicator vaginally as needed.   0   No current facility-administered medications for this visit.    Allergies  Allergen Reactions  . Asa [Aspirin]     Stomach ulcers  . Nabumetone Other (See Comments)    Causes inflammation    History   Social History  . Marital Status: Widowed    Spouse Name: N/A    Number of Children: 13  . Years of Education: 9   Occupational History  . Oncologist     retired   Social History Main Topics  . Smoking status: Current Every Day Smoker -- 1.00 packs/day for 62 years    Types: Cigarettes  . Smokeless tobacco: Never Used  . Alcohol Use: 0.0 oz/week     Comment: occasionally - few drinks/month  . Drug Use: No  . Sexual Activity: Not Currently   Other Topics Concern  . Not on file   Social History Narrative   9th grade. married '54- widowed Dec 10th'08. 4 sons - '55, '58, '60, '69; 1 daughter - '63; 12 grandchildren; 9 great-grandchildren. 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 Other children are close and attentive. ACP/End-of-life: No CPR, no mechanical ventilation, no futile/heroic measures.  Review of Systems: General: negative for chills, fever, night sweats or weight changes.  Cardiovascular: negative for chest pain, dyspnea on exertion, edema, orthopnea, palpitations, paroxysmal nocturnal dyspnea or shortness of breath Dermatological: negative for rash Respiratory: negative for cough or wheezing Urologic: negative for hematuria Abdominal: negative for nausea, vomiting, diarrhea, bright red blood per rectum, melena, or hematemesis Neurologic: negative for visual changes, syncope, or dizziness All other systems reviewed and are otherwise negative except as noted above.    Blood pressure 178/92, pulse 63, height 5' 2.5" (1.588 m), weight 140 lb 4.8 oz (63.64 kg).    General appearance: alert and no distress Neck: no adenopathy, no JVD, supple, symmetrical, trachea midline, thyroid not enlarged, symmetric, no tenderness/mass/nodules and bilateral carotid bruits Lungs: clear to auscultation bilaterally Heart: typical aortic stenosis murmur Extremities: extremities normal, atraumatic, no cyanosis or edema  EKG normal sinus rhythm at 63 with left axis deviation and left atrial enlargement. I personally reviewed the EKG  ASSESSMENT AND PLAN:   Essential hypertension Her blood pressure is elevated today at 178/92. She is on lisinopril, metoprolol and diltiazem. No change in medications at this time  Coronary atherosclerosis History of noncritical CAD at cath in 1997. Her last Myoview performed in 2009 was nonischemic and she denies chest pain.  Aortic stenosis History of mild to moderate aortic stenosis with a valve area of 1.6 cm squared normal LV function. She does have a significant outflow tract murmur. I'm going to repeat a 2-D echocardiogram to assess her aortic stenosis  HYPERLIPIDEMIA Hyperlipidemia on simvastatin followed by her primary care physician  Hx of carotid artery stenosis, RT. -moderate, followed by Dr. Gwenlyn Found, Last dopplers 02/2012 History of moderate right internal carotid artery stenosis by Ultrasound. She does have bilateral carotid bruits. I'm going to repeat carotid Doppler studies.      Lorretta Harp MD FACP,FACC,FAHA, [facility] 04/13/2014 4:46 PM

## 2014-04-13 NOTE — Telephone Encounter (Signed)
Patie

## 2014-04-19 ENCOUNTER — Telehealth: Payer: Self-pay | Admitting: Internal Medicine

## 2014-04-19 ENCOUNTER — Telehealth (HOSPITAL_COMMUNITY): Payer: Self-pay

## 2014-04-19 ENCOUNTER — Other Ambulatory Visit (HOSPITAL_COMMUNITY): Payer: Self-pay | Admitting: *Deleted

## 2014-04-19 NOTE — Telephone Encounter (Signed)
Encounter complete. 

## 2014-04-19 NOTE — Telephone Encounter (Signed)
emmi emailed °

## 2014-04-19 NOTE — Patient Instructions (Addendum)
Tammy Brooks  04/19/2014                           YOUR PROCEDURE IS SCHEDULED ON:  04/26/14                ENTER FROM [address] - GO TO [address]ARKING DECK               LOOK FOR VALET [facility]ARKING  / [employer]                              FOLLOW  SIGNS TO SHORT STAY CENTER                 ARRIVE AT SHORT STAY AT:  7:30 am               CALL THIS NUMBER IF ANY PROBLEMS THE DAY OF SURGERY :               [phone]                                REMEMBER:   Do not eat food or drink liquids AFTER MIDNIGHT                  Take these medicines the morning of surgery with               A SIPS OF WATER : ALLOPURINOL / DILTIAZEM / METOPROLOL / MAY USE EYE DROPS              CLEAR LIQUID DIET DAY BEFORE SURGERY        Do not wear jewelry, make-up   Do not wear lotions, powders, or perfumes.   Do not shave legs or underarms 12 hrs. before surgery (men may shave face)  Do not bring valuables to the hospital.  Contacts, dentures or bridgework may not be worn into surgery.  Leave suitcase in the car. After surgery it may be brought to your room.  For patients admitted to the hospital more than one night, checkout time is            11:00 AM                                                         ________________________________________________________________________                                                                                                  Tammy Brooks  Before surgery, you can play an important role.  Because skin is not sterile, your skin needs to be as free of germs as possible.  You can reduce the number of germs on your skin by washing with CHG (chlorahexidine  gluconate) soap before surgery.  CHG is an antiseptic cleaner which kills germs and bonds with the skin to continue killing germs even after washing. Please DO NOT use if you have an allergy to CHG or antibacterial soaps.  If your skin becomes reddened/irritated stop  using the CHG and inform your nurse when you arrive at Short Stay. Do not shave (including legs and underarms) for at least 48 hours prior to the first CHG shower.  You may shave your face. Please follow these instructions carefully:   1.  Shower with CHG Soap the night before surgery and the  morning of Surgery.   2.  If you choose to wash your hair, wash your hair first as usual with your  normal  Shampoo.   3.  After you shampoo, rinse your hair and body thoroughly to remove the  shampoo.                                         4.  Use CHG as you would any other liquid soap.  You can apply chg directly  to the skin and wash . Gently wash with scrungie or clean wascloth    5.  Apply the CHG Soap to your body ONLY FROM THE NECK DOWN.   Do not use on open                           Wound or open sores. Avoid contact with eyes, ears mouth and genitals (private parts).                        Genitals (private parts) with your normal soap.              6.  Wash thoroughly, paying special attention to the area where your surgery  will be performed.   7.  Thoroughly rinse your body with warm water from the neck down.   8.  DO NOT shower/wash with your normal soap after using and rinsing off  the CHG Soap .                9.  Pat yourself dry with a clean towel.             10.  Wear clean pajamas.             11.  Place clean sheets on your bed the night of your first shower and do not  sleep with pets.  Day of Surgery : Do not apply any lotions/deodorants the morning of surgery.  Please wear clean clothes to the hospital/surgery center.  FAILURE TO FOLLOW THESE INSTRUCTIONS MAY RESULT IN THE CANCELLATION OF YOUR SURGERY    PATIENT SIGNATURE_________________________________  ______________________________________________________________________     [name]  An incentive spirometer is a tool that can help keep your lungs clear and active. This tool measures how well  you are filling your lungs with each breath. Taking long deep breaths may help reverse or decrease the chance of developing breathing (pulmonary) problems (especially infection) following:  A long period of time when you are unable to move or be active. BEFORE THE PROCEDURE   If the spirometer includes an indicator to show your best effort, your nurse or respiratory therapist will set it to a desired goal.  If possible, sit up  straight or lean slightly forward. Try not to slouch.  Hold the incentive spirometer in an upright position. INSTRUCTIONS FOR USE   Sit on the edge of your bed if possible, or sit up as far as you can in bed or on a chair.  Hold the incentive spirometer in an upright position.  Breathe out normally.  Place the mouthpiece in your mouth and seal your lips tightly around it.  Breathe in slowly and as deeply as possible, raising the piston or the ball toward the top of the column.  Hold your breath for 3-5 seconds or for as long as possible. Allow the piston or ball to fall to the bottom of the column.  Remove the mouthpiece from your mouth and breathe out normally.  Rest for a few seconds and repeat Steps 1 through 7 at least 10 times every 1-2 hours when you are awake. Take your time and take a few normal breaths between deep breaths.  The spirometer may include an indicator to show your best effort. Use the indicator as a goal to work toward during each repetition.  After each set of 10 deep breaths, practice coughing to be sure your lungs are clear. If you have an incision (the cut made at the time of surgery), support your incision when coughing by placing a pillow or rolled up towels firmly against it. Once you are able to get out of bed, walk around indoors and cough well. You may stop using the incentive spirometer when instructed by your caregiver.  RISKS AND COMPLICATIONS  Take your time so you do not get dizzy or light-headed.  If you are in pain, you  may need to take or ask for pain medication before doing incentive spirometry. It is harder to take a deep breath if you are having pain. AFTER USE  Rest and breathe slowly and easily.  It can be helpful to keep track of a log of your progress. Your caregiver can provide you with a simple table to help with this. If you are using the spirometer at home, follow these instructions: [place] IF:   You are having difficultly using the spirometer.  You have trouble using the spirometer as often as instructed.  Your pain medication is not giving enough relief while using the spirometer.  You develop fever of 100.5 F (38.1 C) or higher. SEEK IMMEDIATE MEDICAL CARE IF:   You cough up bloody sputum that had not been present before.  You develop fever of 102 F (38.9 C) or greater.  You develop worsening pain at or near the incision site. MAKE SURE YOU:   Understand these instructions.  Will watch your condition.  Will get help right away if you are not doing well or get worse. Document Released: 09/30/2006 Document Revised: 08/12/2011 Document Reviewed: 12/01/2006 ExitCare Patient Information 2014 ExitCare, [place].   ________________________________________________________________________   WHAT IS A BLOOD TRANSFUSION? Blood Transfusion Information  A transfusion is the replacement of blood or some of its parts. Blood is made up of multiple cells which provide different functions.  Red blood cells carry oxygen and are used for blood loss replacement.  White blood cells fight against infection.  Platelets control bleeding.  Plasma helps clot blood.  Other blood products are available for specialized needs, such as hemophilia or other clotting disorders. BEFORE THE TRANSFUSION  Who gives blood for transfusions?   Healthy volunteers who are fully evaluated to make sure their blood is safe. This is blood  bank blood. Transfusion therapy is the safest it has ever  been in the practice of medicine. Before blood is taken from a donor, a complete history is taken to make sure that person has no history of diseases nor engages in risky social behavior (examples are intravenous drug use or sexual activity with multiple partners). The donor's travel history is screened to minimize risk of transmitting infections, such as malaria. The donated blood is tested for signs of infectious diseases, such as HIV and hepatitis. The blood is then tested to be sure it is compatible with you in order to minimize the chance of a transfusion reaction. If you or a relative donates blood, this is often done in anticipation of surgery and is not appropriate for emergency situations. It takes many days to process the donated blood. RISKS AND COMPLICATIONS Although transfusion therapy is very safe and saves many lives, the main dangers of transfusion include:   Getting an infectious disease.  Developing a transfusion reaction. This is an allergic reaction to something in the blood you were given. Every precaution is taken to prevent this. The decision to have a blood transfusion has been considered carefully by your caregiver before blood is given. Blood is not given unless the benefits outweigh the risks. AFTER THE TRANSFUSION  Right after receiving a blood transfusion, you will usually feel much better and more energetic. This is especially true if your red blood cells have gotten low (anemic). The transfusion raises the level of the red blood cells which carry oxygen, and this usually causes an energy increase.  The nurse administering the transfusion will monitor you carefully for complications. HOME CARE INSTRUCTIONS  No special instructions are needed after a transfusion. You may find your energy is better. Speak with your caregiver about any limitations on activity for underlying diseases you may have. SEEK MEDICAL CARE IF:   Your condition is not improving after your  transfusion.  You develop redness or irritation at the intravenous (IV) site. SEEK IMMEDIATE MEDICAL CARE IF:  Any of the following symptoms occur over the next 12 hours:  Shaking chills.  You have a temperature by mouth above 102 F (38.9 C), not controlled by medicine.  Chest, back, or muscle pain.  People around you feel you are not acting correctly or are confused.  Shortness of breath or difficulty breathing.  Dizziness and fainting.  You get a rash or develop hives.  You have a decrease in urine output.  Your urine turns a dark color or changes to pink, red, or brown. Any of the following symptoms occur over the next 10 days:  You have a temperature by mouth above 102 F (38.9 C), not controlled by medicine.  Shortness of breath.  Weakness after normal activity.  The white part of the eye turns yellow (jaundice).  You have a decrease in the amount of urine or are urinating less often.  Your urine turns a dark color or changes to pink, red, or brown. Document Released: 05/17/2000 Document Revised: 08/12/2011 Document Reviewed: 01/04/2008 ExitCare Patient Information 2014 ExitCare, [place].  _______________________________________________________________________    CLEAR LIQUID DIET   Foods Allowed  Foods Excluded  Coffee and tea, regular and decaf                             liquids that you cannot  Plain Jell-O in any flavor                                             see through such as: Fruit ices (not with fruit pulp)                                     milk, soups, orange juice  Iced Popsicles                                    All solid food Carbonated beverages, regular and diet                                    Cranberry, grape and apple juices Sports drinks like Gatorade Lightly seasoned clear broth or consume(fat free) Sugar, honey syrup  Sample Menu Breakfast                                 Lunch                                     Supper Cranberry juice                    Beef broth                            Chicken broth Jell-O                                     Grape juice                           Apple juice Coffee or tea                        Jell-O                                      Popsicle                                                Coffee or tea                        Coffee or tea  _____________________________________________________________________

## 2014-04-20 ENCOUNTER — Encounter (HOSPITAL_COMMUNITY): Payer: Self-pay

## 2014-04-20 ENCOUNTER — Encounter (HOSPITAL_COMMUNITY)
Admission: RE | Admit: 2014-04-20 | Discharge: 2014-04-20 | Disposition: A | Discharge status: Home / Self Care | Payer: Medicare HMO | Source: Ambulatory Visit | Attending: Gynecology | Admitting: Gynecology

## 2014-04-20 ENCOUNTER — Ambulatory Visit (HOSPITAL_COMMUNITY)
Admission: RE | Admit: 2014-04-20 | Discharge: 2014-04-20 | Disposition: A | Discharge status: Home / Self Care | Payer: Medicare HMO | Source: Ambulatory Visit | Attending: Anesthesiology | Admitting: Anesthesiology

## 2014-04-20 DIAGNOSIS — Z87891 Personal history of nicotine dependence: Secondary | ICD-10-CM | POA: Diagnosis not present

## 2014-04-20 DIAGNOSIS — J449 Chronic obstructive pulmonary disease, unspecified: Secondary | ICD-10-CM | POA: Insufficient documentation

## 2014-04-20 DIAGNOSIS — I700 Atherosclerosis of aorta: Secondary | ICD-10-CM | POA: Diagnosis not present

## 2014-04-20 DIAGNOSIS — I10 Essential (primary) hypertension: Secondary | ICD-10-CM

## 2014-04-20 DIAGNOSIS — M5134 Other intervertebral disc degeneration, thoracic region: Secondary | ICD-10-CM | POA: Diagnosis not present

## 2014-04-20 DIAGNOSIS — I252 Old myocardial infarction: Secondary | ICD-10-CM | POA: Diagnosis not present

## 2014-04-20 DIAGNOSIS — Z01818 Encounter for other preprocedural examination: Secondary | ICD-10-CM | POA: Diagnosis not present

## 2014-04-20 HISTORY — DX: Gout, unspecified: M10.9

## 2014-04-20 HISTORY — DX: Intra-abdominal and pelvic swelling, mass and lump, unspecified site: R19.00

## 2014-04-20 HISTORY — DX: Adverse effect of unspecified anesthetic, initial encounter: T41.45XA

## 2014-04-20 HISTORY — DX: Other complications of anesthesia, initial encounter: T88.59XA

## 2014-04-20 LAB — CBC WITH DIFFERENTIAL/PLATELET
BASOS PCT: 0 % (ref 0–1)
Basophils Absolute: 0 10*3/uL (ref 0.0–0.1)
EOS ABS: 0.3 10*3/uL (ref 0.0–0.7)
EOS PCT: 3 % (ref 0–5)
HCT: 46.6 % — ABNORMAL HIGH (ref 36.0–46.0)
Hemoglobin: 15.2 g/dL — ABNORMAL HIGH (ref 12.0–15.0)
Lymphocytes Relative: 24 % (ref 12–46)
Lymphs Abs: 2.2 10*3/uL (ref 0.7–4.0)
MCH: 29 pg (ref 26.0–34.0)
MCHC: 32.6 g/dL (ref 30.0–36.0)
MCV: 88.8 fL (ref 78.0–100.0)
Monocytes Absolute: 0.6 10*3/uL (ref 0.1–1.0)
Monocytes Relative: 6 % (ref 3–12)
Neutro Abs: 6.2 10*3/uL (ref 1.7–7.7)
Neutrophils Relative %: 67 % (ref 43–77)
PLATELETS: 227 10*3/uL (ref 150–400)
RBC: 5.25 MIL/uL — ABNORMAL HIGH (ref 3.87–5.11)
RDW: 13.6 % (ref 11.5–15.5)
WBC: 9.3 10*3/uL (ref 4.0–10.5)

## 2014-04-20 LAB — COMPREHENSIVE METABOLIC PANEL
ALBUMIN: 3.6 g/dL (ref 3.5–5.2)
ALK PHOS: 55 U/L (ref 39–117)
ALT: 15 U/L (ref 0–35)
ANION GAP: 10 (ref 5–15)
AST: 20 U/L (ref 0–37)
BUN: 32 mg/dL — ABNORMAL HIGH (ref 6–23)
CALCIUM: 9.8 mg/dL (ref 8.4–10.5)
CO2: 27 mEq/L (ref 19–32)
Chloride: 107 mEq/L (ref 96–112)
Creatinine, Ser: 1.07 mg/dL (ref 0.50–1.10)
GFR calc Af Amer: 56 mL/min — ABNORMAL LOW (ref >90)
GFR calc non Af Amer: 48 mL/min — ABNORMAL LOW (ref >90)
Glucose, Bld: 109 mg/dL — ABNORMAL HIGH (ref 70–99)
Potassium: 4.8 mEq/L (ref 3.7–5.3)
SODIUM: 144 meq/L (ref 137–147)
TOTAL PROTEIN: 7.1 g/dL (ref 6.0–8.3)
Total Bilirubin: 0.2 mg/dL — ABNORMAL LOW (ref 0.3–1.2)

## 2014-04-20 LAB — URINALYSIS, ROUTINE W REFLEX MICROSCOPIC
BILIRUBIN URINE: NEGATIVE
Glucose, UA: NEGATIVE mg/dL
Hgb urine dipstick: NEGATIVE
Ketones, ur: NEGATIVE mg/dL
Leukocytes, UA: NEGATIVE
NITRITE: NEGATIVE
PH: 5 (ref 5.0–8.0)
Protein, ur: NEGATIVE mg/dL
SPECIFIC GRAVITY, URINE: 1.027 (ref 1.005–1.030)
UROBILINOGEN UA: 0.2 mg/dL (ref 0.0–1.0)

## 2014-04-20 NOTE — Progress Notes (Signed)
cmet results routed to [place] by epic

## 2014-04-21 ENCOUNTER — Ambulatory Visit (HOSPITAL_COMMUNITY)
Admission: RE | Admit: 2014-04-21 | Discharge: 2014-04-21 | Disposition: A | Discharge status: Home / Self Care | Payer: Medicare HMO | Source: Ambulatory Visit | Attending: Cardiology | Admitting: Cardiology

## 2014-04-21 ENCOUNTER — Ambulatory Visit (HOSPITAL_BASED_OUTPATIENT_CLINIC_OR_DEPARTMENT_OTHER)
Admission: RE | Admit: 2014-04-21 | Discharge: 2014-04-21 | Disposition: A | Discharge status: Home / Self Care | Payer: Medicare HMO | Source: Ambulatory Visit | Attending: Cardiology | Admitting: Cardiology

## 2014-04-21 DIAGNOSIS — R9431 Abnormal electrocardiogram [ECG] [EKG]: Secondary | ICD-10-CM | POA: Insufficient documentation

## 2014-04-21 DIAGNOSIS — I350 Nonrheumatic aortic (valve) stenosis: Secondary | ICD-10-CM

## 2014-04-21 DIAGNOSIS — I10 Essential (primary) hypertension: Secondary | ICD-10-CM | POA: Diagnosis not present

## 2014-04-21 DIAGNOSIS — I739 Peripheral vascular disease, unspecified: Secondary | ICD-10-CM | POA: Diagnosis not present

## 2014-04-21 DIAGNOSIS — E663 Overweight: Secondary | ICD-10-CM | POA: Insufficient documentation

## 2014-04-21 DIAGNOSIS — E785 Hyperlipidemia, unspecified: Secondary | ICD-10-CM | POA: Diagnosis not present

## 2014-04-21 DIAGNOSIS — Z01810 Encounter for preprocedural cardiovascular examination: Secondary | ICD-10-CM

## 2014-04-21 DIAGNOSIS — Z720 Tobacco use: Secondary | ICD-10-CM | POA: Insufficient documentation

## 2014-04-21 DIAGNOSIS — I359 Nonrheumatic aortic valve disorder, unspecified: Secondary | ICD-10-CM

## 2014-04-21 DIAGNOSIS — Z01818 Encounter for other preprocedural examination: Secondary | ICD-10-CM

## 2014-04-21 MED ORDER — TECHNETIUM TC 99M SESTAMIBI GENERIC - CARDIOLITE
10.9000 | Freq: Once | INTRAVENOUS | Status: AC | PRN
  Administered 2014-04-21: 10.9 via INTRAVENOUS

## 2014-04-21 MED ORDER — AMINOPHYLLINE 25 MG/ML IV SOLN
75.0000 mg | Freq: Once | INTRAVENOUS | Status: AC
  Administered 2014-04-21: 75 mg via INTRAVENOUS

## 2014-04-21 MED ORDER — TECHNETIUM TC 99M SESTAMIBI GENERIC - CARDIOLITE
30.9000 | Freq: Once | INTRAVENOUS | Status: AC | PRN
  Administered 2014-04-21: 30.9 via INTRAVENOUS

## 2014-04-21 MED ORDER — REGADENOSON 0.4 MG/5ML IV SOLN
0.4000 mg | Freq: Once | INTRAVENOUS | Status: AC
  Administered 2014-04-21: 0.4 mg via INTRAVENOUS

## 2014-04-21 NOTE — Progress Notes (Signed)
Carotid Duplex Completed. °Brianna L Mazza,RVT °

## 2014-04-21 NOTE — Progress Notes (Signed)
2D Echocardiogram Complete.  04/21/2014   Tammy Brooks [place], [place]

## 2014-04-21 NOTE — Procedures (Addendum)
Tammy Brooks [phone]  Cardiology Nuclear Med Study  Tammy Brooks is a 80 y.o. female     MRN : [id number]     DOB: August 27, 1933  Procedure Date: 04/21/2014  Nuclear Med Background Indication for Stress Test:  Evaluation for Ischemia, Surgical Clearance and Abnormal EKG History:  CAD;AORTIC STENOSIS;MI;No prior respiratory history reported;No prior NUC MPI for comparison. Cardiac Risk Factors: CVA, Hypertension, Lipids, Overweight, PVD and Smoker  Symptoms:  Pt denies all symptoms at this time.   Nuclear Pre-Procedure Caffeine/Decaff Intake:  7:00pm NPO After: 5:00am   IV Site: R Forearm  IV 0.9% NS with Angio Cath:  22g  Chest Size (in):  n/a IV Started by: Rolene Course, RN  Height: 5\' 2"  (1.575 m)  Cup Size: B  BMI:  Body mass index is 25.6 kg/(m^2). Weight:  140 lb (63.504 kg)   Tech Comments:  n/a    Nuclear Med Study 1 or 2 day study: 1 day  Stress Test Type:  [place] Provider:  Quay Burow, MD   Resting Radionuclide: Technetium 40m Sestamibi  Resting Radionuclide Dose: 10.9 mCi   Stress Radionuclide:  Technetium 42m Sestamibi  Stress Radionuclide Dose: 30.9 mCi           Stress Protocol Rest HR: 46 Stress HR:68  Rest BP: 126/62 Stress BP: 161/62  Exercise Time (min): n/a METS: n/a          Dose of Adenosine (mg):  n/a Dose of Lexiscan: 0.4 mg  Dose of Atropine (mg): n/a Dose of Dobutamine: n/a mcg/kg/min (at max HR)  Stress Test Technologist: Mellody Memos, CCT Nuclear Technologist: Imagene Riches, CNMT   Rest Procedure:  Myocardial perfusion imaging was performed at rest 45 minutes following the intravenous administration of Technetium 80m Sestamibi. Stress Procedure:  The patient received IV Lexiscan 0.4 mg over 15-seconds.  Technetium 22m Sestamibi injected IV at 30-seconds.  Patient experienced shortness of breath, light-headedness and was  administered 75 mg of Aminophylline IV. There were no significant changes with Lexiscan.  Quantitative spect images were obtained after a 45 minute delay.  Transient Ischemic Dilatation (Normal <1.22):  1.16  QGS EDV:  81 ml QGS ESV:  28 ml LV Ejection Fraction: 65%        Rest ECG: Sinus bradycardia, septal MI.  Stress ECG: No significant ST segment change suggestive of ischemia.  QPS Raw Data Images:  There is interference from nuclear activity from structures below the diaphragm. This does not affect the ability to read the study. Stress Images:  Normal homogeneous uptake in all areas of the myocardium. Rest Images:  Normal homogeneous uptake in all areas of the myocardium. Subtraction (SDS):  No evidence of ischemia.  Impression Exercise Capacity:  Lexiscan with no exercise. BP Response:  Normal blood pressure response. Clinical Symptoms:  There is dyspnea. ECG Impression:  No significant ST segment change suggestive of ischemia. Comparison with Prior Nuclear Study: No significant change from previous study  Overall Impression:  Normal stress nuclear study.  LV Wall Motion:  NL LV Function; NL Wall Motion   Kirk Ruths, MD  04/21/2014 12:31 PM

## 2014-04-22 ENCOUNTER — Telehealth: Payer: Self-pay | Admitting: Cardiovascular Disease

## 2014-04-22 NOTE — Telephone Encounter (Signed)
Pt wants to know if she is cleared for surgery

## 2014-04-22 NOTE — Telephone Encounter (Signed)
Call placed to Dr. Kennon Holter office regarding clearance for surgery on Tuesday Nov 24.

## 2014-04-22 NOTE — Telephone Encounter (Signed)
Tammy Brooks is calling in to speak to Dr. Gwenlyn Found or his nurse on whether or not he cleared her for surgery she was in the office on 11/11. Please call  Thanks

## 2014-04-25 NOTE — Telephone Encounter (Signed)
Mary from the cancer center was calling requesting surgical clearance for Tammy Brooks for her surgery tomorrow to remove her pelvic mass.  Tammy Desrocher just had myoview and echo done for surgical clearance.  I will defer to [name].

## 2014-04-25 NOTE — Telephone Encounter (Signed)
Please see surgical clearance from Dr Adora Fridge.  Thank you!

## 2014-04-25 NOTE — Telephone Encounter (Signed)
Cleared for her GYN surgery at low cardiovascular risk. Nonischemic my view, normal 2-D echo.

## 2014-04-26 ENCOUNTER — Encounter: Payer: Self-pay | Admitting: *Deleted

## 2014-04-26 ENCOUNTER — Inpatient Hospital Stay (HOSPITAL_COMMUNITY)
Admission: RE | Admit: 2014-04-26 | Discharge: 2014-04-27 | DRG: 756 | Disposition: A | Discharge status: Home / Self Care | Payer: Medicare HMO | Source: Ambulatory Visit | Attending: Obstetrics & Gynecology | Admitting: Obstetrics & Gynecology

## 2014-04-26 ENCOUNTER — Encounter (HOSPITAL_COMMUNITY): Payer: Self-pay | Admitting: *Deleted

## 2014-04-26 ENCOUNTER — Inpatient Hospital Stay (HOSPITAL_COMMUNITY): Payer: Medicare HMO | Admitting: *Deleted

## 2014-04-26 ENCOUNTER — Encounter (HOSPITAL_COMMUNITY)
Admission: RE | Disposition: A | Discharge status: Home / Self Care | Payer: Self-pay | Source: Ambulatory Visit | Attending: Obstetrics & Gynecology

## 2014-04-26 DIAGNOSIS — Z7982 Long term (current) use of aspirin: Secondary | ICD-10-CM | POA: Diagnosis not present

## 2014-04-26 DIAGNOSIS — R1900 Intra-abdominal and pelvic swelling, mass and lump, unspecified site: Secondary | ICD-10-CM | POA: Diagnosis present

## 2014-04-26 DIAGNOSIS — I739 Peripheral vascular disease, unspecified: Secondary | ICD-10-CM | POA: Diagnosis present

## 2014-04-26 DIAGNOSIS — Z8673 Personal history of transient ischemic attack (TIA), and cerebral infarction without residual deficits: Secondary | ICD-10-CM | POA: Diagnosis not present

## 2014-04-26 DIAGNOSIS — D270 Benign neoplasm of right ovary: Secondary | ICD-10-CM

## 2014-04-26 DIAGNOSIS — E785 Hyperlipidemia, unspecified: Secondary | ICD-10-CM | POA: Diagnosis present

## 2014-04-26 DIAGNOSIS — F17210 Nicotine dependence, cigarettes, uncomplicated: Secondary | ICD-10-CM | POA: Diagnosis present

## 2014-04-26 DIAGNOSIS — Z9841 Cataract extraction status, right eye: Secondary | ICD-10-CM | POA: Diagnosis not present

## 2014-04-26 DIAGNOSIS — Z961 Presence of intraocular lens: Secondary | ICD-10-CM | POA: Diagnosis present

## 2014-04-26 DIAGNOSIS — I714 Abdominal aortic aneurysm, without rupture: Secondary | ICD-10-CM | POA: Diagnosis present

## 2014-04-26 DIAGNOSIS — I252 Old myocardial infarction: Secondary | ICD-10-CM

## 2014-04-26 DIAGNOSIS — M1990 Unspecified osteoarthritis, unspecified site: Secondary | ICD-10-CM | POA: Diagnosis present

## 2014-04-26 DIAGNOSIS — Z79899 Other long term (current) drug therapy: Secondary | ICD-10-CM

## 2014-04-26 DIAGNOSIS — H5442 Blindness, left eye, normal vision right eye: Secondary | ICD-10-CM | POA: Diagnosis present

## 2014-04-26 DIAGNOSIS — D3911 Neoplasm of uncertain behavior of right ovary: Secondary | ICD-10-CM | POA: Diagnosis present

## 2014-04-26 DIAGNOSIS — I2510 Atherosclerotic heart disease of native coronary artery without angina pectoris: Secondary | ICD-10-CM | POA: Diagnosis present

## 2014-04-26 DIAGNOSIS — Z9842 Cataract extraction status, left eye: Secondary | ICD-10-CM | POA: Diagnosis not present

## 2014-04-26 DIAGNOSIS — I10 Essential (primary) hypertension: Secondary | ICD-10-CM | POA: Diagnosis present

## 2014-04-26 HISTORY — PX: LAPAROTOMY: SHX154

## 2014-04-26 LAB — ABO/RH: ABO/RH(D): A NEG

## 2014-04-26 SURGERY — LAPAROTOMY, EXPLORATORY
Anesthesia: General

## 2014-04-26 MED ORDER — CEFAZOLIN SODIUM-DEXTROSE 2-3 GM-% IV SOLR
INTRAVENOUS | Status: AC
  Filled 2014-04-26: qty 50

## 2014-04-26 MED ORDER — ATROPINE SULFATE 0.4 MG/ML IJ SOLN
INTRAMUSCULAR | Status: DC | PRN
  Administered 2014-04-26: 0.2 mg via INTRAVENOUS

## 2014-04-26 MED ORDER — LACTATED RINGERS IV SOLN
INTRAVENOUS | Status: DC
  Administered 2014-04-26: 1000 mL via INTRAVENOUS

## 2014-04-26 MED ORDER — ROCURONIUM BROMIDE 100 MG/10ML IV SOLN
INTRAVENOUS | Status: DC | PRN
  Administered 2014-04-26: 40 mg via INTRAVENOUS

## 2014-04-26 MED ORDER — PROMETHAZINE HCL 25 MG/ML IJ SOLN: 6.2500 mg | INTRAMUSCULAR | Status: DC | PRN

## 2014-04-26 MED ORDER — ONDANSETRON HCL 4 MG/2ML IJ SOLN
INTRAMUSCULAR | Status: AC
  Filled 2014-04-26: qty 2

## 2014-04-26 MED ORDER — HYDROMORPHONE HCL 1 MG/ML IJ SOLN: 0.2500 mg | INTRAMUSCULAR | Status: DC | PRN

## 2014-04-26 MED ORDER — OXYCODONE HCL 5 MG PO TABS
5.0000 mg | ORAL_TABLET | ORAL | Status: DC | PRN
  Administered 2014-04-26 (×2): 5 mg via ORAL
  Filled 2014-04-26 (×2): qty 1

## 2014-04-26 MED ORDER — ASPIRIN EC 81 MG PO TBEC
81.0000 mg | DELAYED_RELEASE_TABLET | Freq: Every day | ORAL | Status: DC
  Administered 2014-04-27: 81 mg via ORAL
  Filled 2014-04-26: qty 1

## 2014-04-26 MED ORDER — BUPIVACAINE LIPOSOME 1.3 % IJ SUSP
20.0000 mL | Freq: Once | INTRAMUSCULAR | Status: AC
  Administered 2014-04-26: 20 mL
  Filled 2014-04-26: qty 20

## 2014-04-26 MED ORDER — LACTATED RINGERS IV SOLN
INTRAVENOUS | Status: DC | PRN
Start: 2014-04-26 — End: 2014-04-26
  Administered 2014-04-26 (×2): via INTRAVENOUS

## 2014-04-26 MED ORDER — CEFAZOLIN SODIUM-DEXTROSE 2-3 GM-% IV SOLR
2.0000 g | INTRAVENOUS | Status: AC
  Administered 2014-04-26: 2 g via INTRAVENOUS

## 2014-04-26 MED ORDER — NEOSTIGMINE METHYLSULFATE 10 MG/10ML IV SOLN
INTRAVENOUS | Status: AC
  Filled 2014-04-26: qty 1

## 2014-04-26 MED ORDER — ESMOLOL HCL 10 MG/ML IV SOLN
INTRAVENOUS | Status: AC
  Filled 2014-04-26: qty 10

## 2014-04-26 MED ORDER — LIDOCAINE HCL (CARDIAC) 20 MG/ML IV SOLN
INTRAVENOUS | Status: AC
  Filled 2014-04-26: qty 5

## 2014-04-26 MED ORDER — SIMVASTATIN 40 MG PO TABS: 40.0000 mg | ORAL_TABLET | Freq: Every day | ORAL | Status: DC

## 2014-04-26 MED ORDER — ESMOLOL HCL 10 MG/ML IV SOLN
INTRAVENOUS | Status: DC | PRN
  Administered 2014-04-26: 20 mg via INTRAVENOUS

## 2014-04-26 MED ORDER — PROPOFOL 10 MG/ML IV BOLUS
INTRAVENOUS | Status: DC | PRN
  Administered 2014-04-26: 100 mg via INTRAVENOUS

## 2014-04-26 MED ORDER — DORZOLAMIDE HCL-TIMOLOL MAL 2-0.5 % OP SOLN
1.0000 [drp] | Freq: Every day | OPHTHALMIC | Status: DC
  Administered 2014-04-26 – 2014-04-27 (×2): 1 [drp] via OPHTHALMIC
  Filled 2014-04-26: qty 10

## 2014-04-26 MED ORDER — SODIUM CHLORIDE 0.9 % IJ SOLN
INTRAMUSCULAR | Status: AC
  Filled 2014-04-26: qty 20

## 2014-04-26 MED ORDER — LIDOCAINE HCL (CARDIAC) 20 MG/ML IV SOLN
INTRAVENOUS | Status: DC | PRN
  Administered 2014-04-26: 50 mg via INTRAVENOUS

## 2014-04-26 MED ORDER — ENSURE COMPLETE PO LIQD
237.0000 mL | Freq: Two times a day (BID) | ORAL | Status: DC
  Administered 2014-04-26 – 2014-04-27 (×2): 237 mL via ORAL

## 2014-04-26 MED ORDER — HYDROMORPHONE HCL 1 MG/ML IJ SOLN: 0.5000 mg | INTRAMUSCULAR | Status: DC | PRN

## 2014-04-26 MED ORDER — ONDANSETRON HCL 4 MG/2ML IJ SOLN
4.0000 mg | Freq: Four times a day (QID) | INTRAMUSCULAR | Status: DC | PRN
Start: 2014-04-26 — End: 2014-04-27

## 2014-04-26 MED ORDER — KCL IN DEXTROSE-NACL 20-5-0.45 MEQ/L-%-% IV SOLN
INTRAVENOUS | Status: DC
  Administered 2014-04-26: 14:00:00 via INTRAVENOUS
  Filled 2014-04-26 (×3): qty 1000

## 2014-04-26 MED ORDER — DILTIAZEM HCL 60 MG PO TABS
120.0000 mg | ORAL_TABLET | Freq: Every morning | ORAL | Status: DC
  Administered 2014-04-27: 120 mg via ORAL
  Filled 2014-04-26: qty 2

## 2014-04-26 MED ORDER — NEOSTIGMINE METHYLSULFATE 10 MG/10ML IV SOLN
INTRAVENOUS | Status: DC | PRN
  Administered 2014-04-26: 4 mg via INTRAVENOUS
  Administered 2014-04-26: 1 mg via INTRAVENOUS

## 2014-04-26 MED ORDER — PROPOFOL 10 MG/ML IV BOLUS
INTRAVENOUS | Status: AC
  Filled 2014-04-26: qty 20

## 2014-04-26 MED ORDER — COLCHICINE 0.6 MG PO TABS
0.6000 mg | ORAL_TABLET | Freq: Four times a day (QID) | ORAL | Status: DC | PRN
Start: 2014-04-26 — End: 2014-04-27
  Administered 2014-04-27: 0.6 mg via ORAL
  Filled 2014-04-26 (×2): qty 1

## 2014-04-26 MED ORDER — ALLOPURINOL 100 MG PO TABS
100.0000 mg | ORAL_TABLET | Freq: Every day | ORAL | Status: DC
  Administered 2014-04-27: 100 mg via ORAL
  Filled 2014-04-26: qty 1

## 2014-04-26 MED ORDER — METOPROLOL TARTRATE 25 MG PO TABS
25.0000 mg | ORAL_TABLET | Freq: Two times a day (BID) | ORAL | Status: DC
  Administered 2014-04-26 – 2014-04-27 (×2): 25 mg via ORAL
  Filled 2014-04-26 (×3): qty 1

## 2014-04-26 MED ORDER — HYDRALAZINE HCL 20 MG/ML IJ SOLN
INTRAMUSCULAR | Status: DC | PRN
  Administered 2014-04-26: 5 mg via INTRAVENOUS
  Administered 2014-04-26: 2.5 mg via INTRAVENOUS

## 2014-04-26 MED ORDER — GLYCOPYRROLATE 0.2 MG/ML IJ SOLN
INTRAMUSCULAR | Status: DC | PRN
  Administered 2014-04-26: 0.6 mg via INTRAVENOUS
  Administered 2014-04-26: 0.1 mg via INTRAVENOUS

## 2014-04-26 MED ORDER — ATORVASTATIN CALCIUM 20 MG PO TABS
20.0000 mg | ORAL_TABLET | Freq: Every day | ORAL | Status: DC
  Administered 2014-04-26: 20 mg via ORAL
  Filled 2014-04-26 (×2): qty 1

## 2014-04-26 MED ORDER — HYDROMORPHONE HCL 1 MG/ML IJ SOLN
INTRAMUSCULAR | Status: AC
  Administered 2014-04-26: 0.5 mg
  Filled 2014-04-26: qty 1

## 2014-04-26 MED ORDER — FENTANYL CITRATE 0.05 MG/ML IJ SOLN
INTRAMUSCULAR | Status: AC
  Filled 2014-04-26: qty 5

## 2014-04-26 MED ORDER — HYDRALAZINE HCL 20 MG/ML IJ SOLN
INTRAMUSCULAR | Status: AC
  Filled 2014-04-26: qty 1

## 2014-04-26 MED ORDER — FENTANYL CITRATE 0.05 MG/ML IJ SOLN
INTRAMUSCULAR | Status: DC | PRN
  Administered 2014-04-26 (×2): 50 ug via INTRAVENOUS
  Administered 2014-04-26: 25 ug via INTRAVENOUS
  Administered 2014-04-26: 50 ug via INTRAVENOUS
  Administered 2014-04-26: 25 ug via INTRAVENOUS
  Administered 2014-04-26: 50 ug via INTRAVENOUS

## 2014-04-26 MED ORDER — CETYLPYRIDINIUM CHLORIDE 0.05 % MT LIQD
7.0000 mL | Freq: Two times a day (BID) | OROMUCOSAL | Status: DC
  Administered 2014-04-26 – 2014-04-27 (×3): 7 mL via OROMUCOSAL

## 2014-04-26 MED ORDER — ONDANSETRON HCL 4 MG PO TABS: 4.0000 mg | ORAL_TABLET | Freq: Four times a day (QID) | ORAL | Status: DC | PRN

## 2014-04-26 MED ORDER — LISINOPRIL 20 MG PO TABS
20.0000 mg | ORAL_TABLET | Freq: Two times a day (BID) | ORAL | Status: DC
  Administered 2014-04-26 – 2014-04-27 (×2): 20 mg via ORAL
  Filled 2014-04-26 (×4): qty 1

## 2014-04-26 MED ORDER — GLYCOPYRROLATE 0.2 MG/ML IJ SOLN
INTRAMUSCULAR | Status: AC
  Filled 2014-04-26: qty 3

## 2014-04-26 MED ORDER — TRAMADOL HCL 50 MG PO TABS
50.0000 mg | ORAL_TABLET | Freq: Four times a day (QID) | ORAL | Status: DC
  Administered 2014-04-26 – 2014-04-27 (×4): 50 mg via ORAL
  Filled 2014-04-26 (×4): qty 1

## 2014-04-26 MED ORDER — ACETAMINOPHEN 500 MG PO TABS
1000.0000 mg | ORAL_TABLET | Freq: Four times a day (QID) | ORAL | Status: DC
  Administered 2014-04-26 – 2014-04-27 (×4): 1000 mg via ORAL
  Filled 2014-04-26 (×8): qty 2

## 2014-04-26 MED ORDER — MIDAZOLAM HCL 2 MG/2ML IJ SOLN
INTRAMUSCULAR | Status: AC
  Filled 2014-04-26: qty 2

## 2014-04-26 MED ORDER — HEPARIN SODIUM (PORCINE) 1000 UNIT/ML IJ SOLN
INTRAMUSCULAR | Status: AC
  Filled 2014-04-26: qty 1

## 2014-04-26 MED ORDER — ONDANSETRON HCL 4 MG/2ML IJ SOLN
INTRAMUSCULAR | Status: DC | PRN
  Administered 2014-04-26: 4 mg via INTRAVENOUS

## 2014-04-26 MED ORDER — ROCURONIUM BROMIDE 100 MG/10ML IV SOLN
INTRAVENOUS | Status: AC
  Filled 2014-04-26: qty 1

## 2014-04-26 MED ORDER — ATROPINE SULFATE 0.4 MG/ML IJ SOLN
INTRAMUSCULAR | Status: AC
  Filled 2014-04-26: qty 1

## 2014-04-26 SURGICAL SUPPLY — 45 items
ATTRACTOMAT 16X20 MAGNETIC DRP (DRAPES) ×3 IMPLANT
BAG URINE DRAINAGE (UROLOGICAL SUPPLIES) ×1 IMPLANT
BLADE EXTENDED COATED 6.5IN (ELECTRODE) ×3 IMPLANT
CANISTER SUCTION 2500CC (MISCELLANEOUS) ×3 IMPLANT
CHLORAPREP W/TINT 26ML (MISCELLANEOUS) ×3 IMPLANT
CLEANER TIP ELECTROSURG 2X2 (MISCELLANEOUS) ×3 IMPLANT
CLIP TI MEDIUM LARGE 6 (CLIP) IMPLANT
CONT SPEC 4OZ CLIKSEAL STRL BL (MISCELLANEOUS) ×3 IMPLANT
COVER SURGICAL LIGHT HANDLE (MISCELLANEOUS) ×3 IMPLANT
DRAPE UTILITY XL STRL (DRAPES) ×3 IMPLANT
DRAPE WARM FLUID 44X44 (DRAPE) ×3 IMPLANT
ELECT REM PT RETURN 9FT ADLT (ELECTROSURGICAL) ×3
ELECTRODE REM PT RTRN 9FT ADLT (ELECTROSURGICAL) ×1 IMPLANT
GAUZE SPONGE 4X4 12PLY STRL (GAUZE/BANDAGES/DRESSINGS) ×3 IMPLANT
GAUZE SPONGE 4X4 16PLY XRAY LF (GAUZE/BANDAGES/DRESSINGS) ×3 IMPLANT
GLOVE BIO SURGEON STRL SZ 6.5 (GLOVE) ×4 IMPLANT
GLOVE BIO SURGEONS STRL SZ 6.5 (GLOVE) ×2
GLOVE BIOGEL M STRL SZ7.5 (GLOVE) ×9 IMPLANT
GOWN STRL REUS W/TWL LRG LVL3 (GOWN DISPOSABLE) ×3 IMPLANT
GOWN STRL REUS W/TWL XL LVL3 (GOWN DISPOSABLE) ×3 IMPLANT
KIT BASIN OR (CUSTOM PROCEDURE TRAY) ×3 IMPLANT
LIGASURE IMPACT 36 18CM CVD LR (INSTRUMENTS) IMPLANT
NS IRRIG 1000ML POUR BTL (IV SOLUTION) ×8 IMPLANT
PACK GENERAL/GYN (CUSTOM PROCEDURE TRAY) ×3 IMPLANT
SHEET LAVH (DRAPES) ×3 IMPLANT
SPONGE LAP 18X18 X RAY DECT (DISPOSABLE) IMPLANT
STAPLER VISISTAT 35W (STAPLE) ×3 IMPLANT
SUT ETHILON 1 LR 30 (SUTURE) IMPLANT
SUT PDS AB 1 CTXB1 36 (SUTURE) ×6 IMPLANT
SUT SILK 2 0 (SUTURE)
SUT SILK 2 0 30  PSL (SUTURE)
SUT SILK 2 0 30 PSL (SUTURE) IMPLANT
SUT SILK 2-0 18XBRD TIE 12 (SUTURE) IMPLANT
SUT VIC AB 0 CT1 36 (SUTURE) IMPLANT
SUT VIC AB 2-0 CT2 27 (SUTURE) ×18 IMPLANT
SUT VIC AB 2-0 SH 27 (SUTURE) ×6
SUT VIC AB 2-0 SH 27X BRD (SUTURE) ×2 IMPLANT
SUT VIC AB 3-0 CTX 36 (SUTURE) IMPLANT
SUT VICRYL 2 0 18  UND BR (SUTURE) ×2
SUT VICRYL 2 0 18 UND BR (SUTURE) ×1 IMPLANT
TAPE CLOTH SURG 4X10 WHT LF (GAUZE/BANDAGES/DRESSINGS) ×3 IMPLANT
TOWEL OR 17X26 10 PK STRL BLUE (TOWEL DISPOSABLE) ×3 IMPLANT
TOWEL OR NON WOVEN STRL DISP B (DISPOSABLE) ×3 IMPLANT
TRAY FOLEY CATH 14FRSI W/METER (CATHETERS) ×3 IMPLANT
WATER STERILE IRR 1500ML POUR (IV SOLUTION) IMPLANT

## 2014-04-26 NOTE — H&P (View-Only) (Signed)
Consult Note: Gyn-Onc   KHILA PAPP 80 y.o. female  No chief complaint on file.   Assessment :complex right adnexal mass.  Plan:the differential diagnosis of the pelvic masses discussed with the patient and her sister-in-law. I recommend she undergo exploratory laparotomy with resection of the mass and intraoperative frozen section to guide any further therapy. I reviewed the patient's CTs findings as well as CA-125 report.  Surgery is scheduled for 03/26/2014. We will seek cardiology consultation with Dr. Gwenlyn Found prior to surgery.  The risks of surgery including hemorrhage infection, injury to adjacent viscera, anesthetic risks, and thromboembolic palpitations were reviewed. Patient also understands she is at higher risk based on her underlying vascular disease.   HPI:  80 year old white female seen in consultation at the request of Dr. Aloha Gell for evaluation of a newly diagnosed complex right pelvic mass.  The patient underwent a CT scan to assess an abdominal aortic aneurysm on 03/07/2014. An incidental finding was made of a 9 cm solid and cystic right adnexal mass. CT did not show any evidence of adenopathy, ascites, or any other evidence of metastatic disease. CA-125 value is 8.1 units per mL. The patient has no past gynecologic history except for undergoing a vaginal hysterectomy and previous tubal ligation. She denies any family history of ovarian cancer.  Patient denies any pelvic pain pressure or any other GI or GU symptoms.  She does have significant medical comorbidities especially vascular. The aortic aneurysm measures 4.1 x 4.3 cm with extensive mural thrombus throughout the aneurysm.  Review of Systems:10 point review of systems is negative except as noted in interval history.   Vitals: Blood pressure 172/66, pulse 53, temperature 97.9 F (36.6 C), temperature source Oral, resp. rate 16, height 5\' 2"  (1.575 m), weight 142 lb 14.4 oz (64.819 kg).  Physical  Exam: General : The patient is a healthy woman in no acute distress.  HEENT: normocephalic, extraoccular movements normal; neck is supple without thyromegally  Lynphnodes: Supraclavicular and inguinal nodes not enlarged  Abdomen: Soft, non-tender, no ascites, no organomegally, no masses, no hernias  Pelvic:  EGBUS: Normal female  Vagina: Normal, no lesions  Urethra and Bladder: Normal, non-tender  Cervix: Surgically absent  Uterus: Surgically absent  Bi-manual examination:nodular pelvic mass extending from the right to the midline. This is nontender and mobile.  Rectal: normal sphincter tone, no masses, no blood  Lower extremities: No edema or varicosities. Normal range of motion      Allergies  Allergen Reactions  . Asa [Aspirin]     Stomach ulcers  . Nabumetone Other (See Comments)    Causes inflammation    Past Medical History  Diagnosis Date  . Arthritis   . Blind left eye   . Heart murmur   . Hyperlipidemia   . Hypertension   . Myocardial infarction     mild/ age 70  . Stroke 2001    no paralysis  . Ulcer   . CAD (coronary artery disease) 08/1995    50-55% second diag dz.; occluded OM  . PAD (peripheral artery disease)     mild to mod Rt ICA stenosis followed by doppler-02/05/12  . Echocardiogram abnormal 03/2012    mild to mod AS, valve area1.58 cm2, mild concentric LVH, grade I diastolic dusfunction  . Tobacco abuse     Past Surgical History  Procedure Laterality Date  . Cataract extraction w/ intraocular lens  implant, bilateral    . Eye surgery      lense implant  removed/ blind in left eye  . Upper gastrointestinal endoscopy      treated for h-pylori  . Colonoscopy    . Polypectomy    . Tubal ligation    . Appendectomy    . Tonsillectomy and adenoidectomy    . Abdominal hysterectomy    . Cardiac catheterization  08/1995    50-55% second diag lesion, occluded OM    Current Outpatient Prescriptions  Medication Sig Dispense Refill  . allopurinol  (ZYLOPRIM) 100 MG tablet Take 1 tablet (100 mg total) by mouth daily. 90 tablet 3  . aspirin 81 MG tablet Take 81 mg by mouth daily.      . colchicine 0.6 MG tablet Take 1 tablet (0.6 mg total) by mouth 4 (four) times daily as needed. 60 tablet 3  . diltiazem (CARDIZEM) 120 MG tablet Take 1 tablet (120 mg total) by mouth daily. 90 tablet 3  . dorzolamide-timolol (COSOPT) 22.3-6.8 MG/ML ophthalmic solution Place 1 drop into the left eye daily.      Marland Kitchen lisinopril (PRINIVIL,ZESTRIL) 20 MG tablet Take 1 tablet (20 mg total) by mouth 2 (two) times daily. 180 tablet 3  . meloxicam (MOBIC) 15 MG tablet Take 1 tablet (15 mg total) by mouth daily as needed for pain. 90 tablet 1  . metoprolol (LOPRESSOR) 50 MG tablet Take 1 tablet (50 mg total) by mouth 2 (two) times daily. 180 tablet 3  . Polyethylene Glycol 3350 POWD 1 CAPFUL OF POWDER MIXED IN 8 0Z OF WATER 2 X DAY    . simvastatin (ZOCOR) 40 MG tablet Take 1 tablet (40 mg total) by mouth at bedtime. 90 tablet 3  . terconazole (TERAZOL 3) 0.8 % vaginal cream   0   No current facility-administered medications for this visit.    History   Social History  . Marital Status: Widowed    Spouse Name: N/A    Number of Children: 8  . Years of Education: 9   Occupational History  . Oncologist     retired   Social History Main Topics  . Smoking status: Current Every Day Smoker -- 1.00 packs/day for 62 years    Types: Cigarettes  . Smokeless tobacco: Never Used  . Alcohol Use: 0.0 oz/week     Comment: occasionally - few drinks/month  . Drug Use: No  . Sexual Activity: Not Currently   Other Topics Concern  . Not on file   Social History Narrative   9th grade. married '54- widowed Dec 10th'08. 4 sons - '55, '58, '60, '69; 1 daughter - '63; 12 grandchildren; 9 great-grandchildren. 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 Other children are close and attentive. ACP/End-of-life: No CPR, no mechanical  ventilation, no futile/heroic measures.    Family History  Problem Relation Age of Onset  . Aneurysm Mother     AAA  . Hypertension Mother   . Heart disease Father     CAD, MI x 3  . Heart attack Father   . Cancer Maternal Aunt     breast cancer      CLARKE-PEARSON,Slate Debroux L, MD 04/08/2014, 1:53 PM

## 2014-04-26 NOTE — Anesthesia Preprocedure Evaluation (Addendum)
Anesthesia Evaluation  Patient identified by MRN, date of birth, ID band Patient awake    Reviewed: Allergy & Precautions, H&P , NPO status , Patient's Chart, lab work & pertinent test results  History of Anesthesia Complications (+) history of anesthetic complications  Airway Mallampati: II  TM Distance: >3 FB Neck ROM: Full    Dental  (+) Edentulous Lower, Edentulous Upper   Pulmonary Current Smoker,  breath sounds clear to auscultation  Pulmonary exam normal       Cardiovascular hypertension, Pt. on medications and Pt. on home beta blockers + CAD, + Past MI and + Peripheral Vascular Disease + Valvular Problems/Murmurs AS Rhythm:Regular Rate:Normal + Systolic murmurs Carotid stenosis, being followed. AAA, being followed.   Neuro/Psych PSYCHIATRIC DISORDERS CVA    GI/Hepatic negative GI ROS, Neg liver ROS,   Endo/Other  negative endocrine ROS  Renal/GU negative Renal ROS  negative genitourinary   Musculoskeletal  (+) Arthritis -,   Abdominal   Peds negative pediatric ROS (+)  Hematology negative hematology ROS (+)   Anesthesia Other Findings   Reproductive/Obstetrics negative OB ROS                            Anesthesia Physical Anesthesia Plan  ASA: III  Anesthesia Plan: General   Post-op Pain Management:    Induction: Intravenous  Airway Management Planned: Oral ETT  Additional Equipment:   Intra-op Plan:   Post-operative Plan: Extubation in OR  Informed Consent: I have reviewed the patients History and Physical, chart, labs and discussed the procedure including the risks, benefits and alternatives for the proposed anesthesia with the patient or authorized representative who has indicated his/her understanding and acceptance.   Dental advisory given  Plan Discussed with: CRNA  Anesthesia Plan Comments:         Anesthesia Quick Evaluation

## 2014-04-26 NOTE — Plan of Care (Signed)
Problem: Phase I Progression Outcomes Goal: Admission history reviewed Outcome: Completed/Met Date Met:  04/26/14 Goal: VS, stable, temp < 100.4 degrees F Outcome: Completed/Met Date Met:  04/26/14 Goal: IS, TCDB as ordered Outcome: Completed/Met Date Met:  04/26/14  Problem: Phase II Progression Outcomes Goal: Incision/dressings dry and intact Outcome: Completed/Met Date Met:  04/26/14

## 2014-04-26 NOTE — Transfer of Care (Signed)
Immediate Anesthesia Transfer of Care Note  Patient: Tammy Brooks  Procedure(s) Performed: Procedure(s): EXPLORATORY LAPAROTOMY/RESECTION OF PELVIC MASS (N/A)  Patient Location: PACU  Anesthesia Type:General  Level of Consciousness: awake, alert , oriented and patient cooperative  Airway & Oxygen Therapy: Patient Spontanous Breathing and Patient connected to face mask oxygen  Post-op Assessment: Report given to PACU RN and Post -op Vital signs reviewed and stable  Post vital signs: Reviewed and stable  Complications: No apparent anesthesia complications

## 2014-04-26 NOTE — Interval H&P Note (Signed)
History and Physical Interval Note:  04/26/2014 8:45 AM  Tammy Brooks  has presented today for surgery, with the diagnosis of pelvic mass  The various methods of treatment have been discussed with the patient and family. After consideration of risks, benefits and other options for treatment, the patient has consented to  Procedure(s): EXPLORATORY LAPAROTOMY/RESECTION OF PELVIC MASS (N/A) as a surgical intervention .  The patient's history has been reviewed, patient examined, no change in status, stable for surgery.  I have reviewed the patient's chart and labs.  Questions were answered to the patient's satisfaction.     CLARKE-PEARSON,Aleyna Cueva L

## 2014-04-26 NOTE — Anesthesia Postprocedure Evaluation (Signed)
  Anesthesia Post-op Note  Patient: Tammy Brooks  Procedure(s) Performed: Procedure(s) (LRB): EXPLORATORY LAPAROTOMY/RESECTION OF PELVIC MASS (N/A)  Patient Location: PACU  Anesthesia Type: General  Level of Consciousness: awake and alert   Airway and Oxygen Therapy: Patient Spontanous Breathing  Post-op Pain: mild  Post-op Assessment: Post-op Vital signs reviewed, Patient's Cardiovascular Status Stable, Respiratory Function Stable, Patent Airway and No signs of Nausea or vomiting  Last Vitals:  Filed Vitals:   04/26/14 1342  BP: 153/53  Pulse: 72  Temp: 36.6 C  Resp: 15    Post-op Vital Signs: stable   Complications: No apparent anesthesia complications

## 2014-04-26 NOTE — Op Note (Signed)
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 with oral tracheal tube ESTIMATED BLOOD LOSS: 50 mL  SURGICAL FINDINGS: At the time of exploratory laparotomy a complex solid and cystic mass was found to be arising from the right ovary that measured approximately 8 cm in diameter. On frozen section this was determined to be a stromal tumor (benign). The left tube and ovary were atrophic. The patient previously had a hysterectomy. Exploration of the upper abdomen was normal.  PROCEDURE: Patient brought to the operating room and after satisfactory attainment of general anesthesia was placed in a modified lithotomy position in [place]. The anterior abdominal wall perineum and vagina were prepped, a Foley catheter was placed, and the patient was draped. Surgical timeout was taken. Antibiotics were administered and SCDs were activated. The abdomen was entered through midline incision extending from the umbilicus to the pubis. Peritoneal washings were obtained from the pelvis but with the final frozen section report we discarded the washings. The upper abdomen was explored with the above-noted findings. A Bookwalter retractor was positioned and bowel packed out of the pelvis. The right pelvic sidewall peritoneum was incised and the residual round ligament grasped for traction. The retroperitoneal spaces opened identifying the vessels and ureter. The ovarian vessels were skeletonized then clamped cut and doubly ligated. The distal portion of the tube and round ligament were then crossclamped and divided and the tube and adnexal mass handed  off the operative field for frozen section.  Attention was turned the left side the pelvis. In a similar fashion the left retroperitoneal space was opened identifying the ureter. The ovarian vessels were isolated clamped cut and doubly ligated. The left tube and ovary were likewise removed and submitted for permanent section. The pelvis is irrigated and found to be hemostatic. Packs and retractors removed. The anterior abdominal wall was closed in layers the first being a running mass closure using #1 PDS. Subcutaneous tissue was irrigated and hemostasis achieved with cautery. 40 mL of experil was injected into the subcutaneous layer. The skin was closed with skin staples. A dressing was applied. The patient was awakened from anesthesia and taken to the recovery room in satisfactory condition. Sponge needle and isthmic counts correct 2.   Marti Sleigh, M.D

## 2014-04-27 LAB — CBC
HCT: 39 % (ref 36.0–46.0)
HEMOGLOBIN: 12.8 g/dL (ref 12.0–15.0)
MCH: 28.6 pg (ref 26.0–34.0)
MCHC: 32.8 g/dL (ref 30.0–36.0)
MCV: 87.2 fL (ref 78.0–100.0)
Platelets: 208 10*3/uL (ref 150–400)
RBC: 4.47 MIL/uL (ref 3.87–5.11)
RDW: 13.6 % (ref 11.5–15.5)
WBC: 8.5 10*3/uL (ref 4.0–10.5)

## 2014-04-27 LAB — BASIC METABOLIC PANEL
Anion gap: 9 (ref 5–15)
BUN: 11 mg/dL (ref 6–23)
CHLORIDE: 101 meq/L (ref 96–112)
CO2: 27 mEq/L (ref 19–32)
CREATININE: 1.07 mg/dL (ref 0.50–1.10)
Calcium: 8.9 mg/dL (ref 8.4–10.5)
GFR, EST AFRICAN AMERICAN: 56 mL/min — AB (ref >90)
GFR, EST NON AFRICAN AMERICAN: 48 mL/min — AB (ref >90)
GLUCOSE: 114 mg/dL — AB (ref 70–99)
POTASSIUM: 4 meq/L (ref 3.7–5.3)
Sodium: 137 mEq/L (ref 137–147)

## 2014-04-27 MED ORDER — OXYCODONE HCL 5 MG PO TABS: 5.0000 mg | ORAL_TABLET | ORAL | Status: DC | PRN

## 2014-04-27 NOTE — Discharge Summary (Signed)
Physician Discharge Summary  Patient ID: SHARIE AMORIN MRN: [id number] DOB/AGE: 02/18/1934 80 y.o.  Admit date: 04/26/2014 Discharge date: 04/27/2014  Admission Diagnoses: Pelvic mass in female  Discharge Diagnoses:  Principal Problem:   Pelvic mass in female   Discharged Condition:  The patient is in good condition and stable for discharge.   Hospital Course: On 04/26/2014, the patient underwent the following: Procedure(s):  EXPLORATORY LAPAROTOMY/RESECTION OF PELVIC MASS.  The postoperative course was uneventful.  She was discharged to home on postoperative day 1 tolerating a regular diet.  Consults: None  Significant Diagnostic Studies: None  Treatments: surgery: see above  Discharge Exam: Blood pressure 139/52, pulse 52, temperature 98 F (36.7 C), temperature source Oral, resp. rate 18, height 5\' 2"  (1.575 m), weight 140 lb (63.504 kg), SpO2 93 %. General appearance: alert, cooperative, appears stated age and no distress Resp: clear to auscultation bilaterally Cardio: regular rate and rhythm and systolic murmur noted GI: soft, non-tender; bowel sounds normal; no masses,  no organomegaly Extremities: extremities normal, atraumatic, no cyanosis or edema Incision/Wound: Midline incision with staples, no drainage or erythema present  Disposition: 01-Home or Self Care      Discharge Instructions    Call MD for:  difficulty breathing, headache or visual disturbances    Complete by:  As directed      Call MD for:  extreme fatigue    Complete by:  As directed      Call MD for:  hives    Complete by:  As directed      Call MD for:  persistant dizziness or light-headedness    Complete by:  As directed      Call MD for:  persistant nausea and vomiting    Complete by:  As directed      Call MD for:  redness, tenderness, or signs of infection (pain, swelling, redness, odor or green/yellow discharge around incision site)    Complete by:  As directed      Call MD for:  severe  uncontrolled pain    Complete by:  As directed      Call MD for:  temperature >100.4    Complete by:  As directed      Diet - low sodium heart healthy    Complete by:  As directed      Driving Restrictions    Complete by:  As directed   No driving for 2 weeks.  Do not take narcotics and drive.     Increase activity slowly    Complete by:  As directed      Lifting restrictions    Complete by:  As directed   No lifting greater than 10 lbs.     Sexual Activity Restrictions    Complete by:  As directed   No sexual activity, nothing in the vagina, for 6 weeks.            Medication List    STOP taking these medications        levofloxacin 500 MG tablet  Commonly known as:  LEVAQUIN      TAKE these medications        allopurinol 100 MG tablet  Commonly known as:  ZYLOPRIM  Take 1 tablet (100 mg total) by mouth daily.     aspirin EC 81 MG tablet  Take 81 mg by mouth daily.     colchicine 0.6 MG tablet  Take 1 tablet (0.6 mg total) by mouth 4 (four) times daily  as needed.     diltiazem 120 MG tablet  Commonly known as:  CARDIZEM  Take 1 tablet (120 mg total) by mouth daily.     dorzolamide-timolol 22.3-6.8 MG/ML ophthalmic solution  Commonly known as:  COSOPT  Place 1 drop into the left eye daily.     lisinopril 20 MG tablet  Commonly known as:  PRINIVIL,ZESTRIL  Take 1 tablet (20 mg total) by mouth 2 (two) times daily.     meloxicam 15 MG tablet  Commonly known as:  MOBIC  Take 1 tablet (15 mg total) by mouth daily as needed for pain.     metoprolol 50 MG tablet  Commonly known as:  LOPRESSOR  Take 1 tablet (50 mg total) by mouth 2 (two) times daily.     oxyCODONE 5 MG immediate release tablet  Commonly known as:  Oxy IR/ROXICODONE  Take 1-2 tablets (5-10 mg total) by mouth every 4 (four) hours as needed for severe pain.     polyethylene glycol packet  Commonly known as:  MIRALAX / GLYCOLAX  Take 17 g by mouth daily.     simvastatin 40 MG tablet   Commonly known as:  ZOCOR  Take 1 tablet (40 mg total) by mouth at bedtime.       Follow-up Information    Follow up with Alvino Chapel, MD On 05/30/2014.   Specialty:  Gynecology   Why:  at 10:45am at the [place] information:   [address] [place] [address] [phone]       Follow up with Jaziel Bennett, Genella Rife, NP On 05/03/2014.   Specialty:  Gynecologic Oncology   Why:  at 10:30am at the [facility] for staple removal   Contact information:   [address] [place] [address] [phone]       Greater than thirty minutes were spend for face to face discharge instructions and discharge orders/summary in EPIC.   Signed: Posey Jasmin DEAL 04/27/2014, 9:58 AM

## 2014-04-27 NOTE — Plan of Care (Signed)
Problem: Phase I Progression Outcomes Goal: Pain controlled with appropriate interventions Outcome: Completed/Met Date Met:  04/27/14     

## 2014-04-27 NOTE — Discharge Instructions (Signed)
04/27/2014  Return to work: 4-6 weeks if applicable  Activity: 1. Be up and out of the bed during the day.  Take a nap if needed.  You may walk up steps but be careful and use the hand rail.  Stair climbing will tire you more than you think, you may need to stop part way and rest.   2. No lifting or straining for 6 weeks.  3. No driving for 2 week(s).  Do not drive if you are taking narcotic pain medicine.  4. Shower daily.  Use soap and water on your incision and pat dry; don't rub.  No tub baths until cleared by your surgeon.   5. No sexual activity and nothing in the vagina for 6 weeks.  Diet: 1. Low sodium Heart Healthy Diet is recommended.  2. It is safe to use a laxative, such as Miralax or Colace, if you have difficulty moving your bowels.   Wound Care: 1. Keep clean and dry.  Shower daily.  Reasons to call the Doctor:  Fever - Oral temperature greater than 100.4 degrees Fahrenheit  Foul-smelling vaginal discharge  Difficulty urinating  Nausea and vomiting  Increased pain at the site of the incision that is unrelieved with pain medicine.  Difficulty breathing with or without chest pain  New calf pain especially if only on one side  Sudden, continuing increased vaginal bleeding with or without clots.   Contacts: For questions or concerns you should contact:  Dr. Fermin Schwab at [place]  Dr. Lahoma Crocker at [phone]  Joylene John, NP at [phone]  After Hours: call [phone] and ask for the GYN Oncologist on call  Oxycodone tablets or capsules What is this medicine? OXYCODONE (ox i KOE done) is a pain reliever. It is used to treat moderate to severe pain. This medicine may be used for other purposes; ask your health care provider or pharmacist if you have questions. COMMON BRAND NAME(S): Dazidox, Endocodone, OXECTA, OxyIR, Percolone, Roxicodone What should I tell my health care provider before I take this medicine? They  need to know if you have any of these conditions: -Addison's disease -brain tumor -drug abuse or addiction -head injury -heart disease -if you frequently drink alcohol containing drinks -kidney disease or problems going to the bathroom -liver disease -lung disease, asthma, or breathing problems -mental problems -an unusual or allergic reaction to oxycodone, codeine, hydrocodone, morphine, other medicines, foods, dyes, or preservatives -pregnant or trying to get pregnant -breast-feeding How should I use this medicine? Take this medicine by mouth with a glass of water. Follow the directions on the prescription label. You can take it with or without food. If it upsets your stomach, take it with food. Take your medicine at regular intervals. Do not take it more often than directed. Do not stop taking except on your doctor's advice. Some brands of this medicine, like Oxecta, have special instructions. Ask your doctor or pharmacist if these directions are for you: Do not cut, crush or chew this medicine. Swallow only one tablet at a time. Do not wet, soak, or lick the tablet before you take it. Talk to your pediatrician regarding the use of this medicine in children. Special care may be needed. Overdosage: If you think you have taken too much of this medicine contact a poison control center or emergency room at once. NOTE: This medicine is only for you. Do not share this medicine with others. What if I miss a dose? If you miss a dose, take it  as soon as you can. If it is almost time for your next dose, take only that dose. Do not take double or extra doses. What may interact with this medicine? -alcohol -antihistamines -certain medicines used for nausea like chlorpromazine, droperidol -erythromycin -ketoconazole -medicines for depression, anxiety, or psychotic disturbances -medicines for sleep -muscle relaxants -naloxone -naltrexone -narcotic medicines (opiates) for  pain -nilotinib -phenobarbital -phenytoin -rifampin -ritonavir -voriconazole This list may not describe all possible interactions. Give your health care provider a list of all the medicines, herbs, non-prescription drugs, or dietary supplements you use. Also tell them if you smoke, drink alcohol, or use illegal drugs. Some items may interact with your medicine. What should I watch for while using this medicine? Tell your doctor or health care professional if your pain does not go away, if it gets worse, or if you have new or a different type of pain. You may develop tolerance to the medicine. Tolerance means that you will need a higher dose of the medicine for pain relief. Tolerance is normal and is expected if you take this medicine for a long time. Do not suddenly stop taking your medicine because you may develop a severe reaction. Your body becomes used to the medicine. This does NOT mean you are addicted. Addiction is a behavior related to getting and using a drug for a non-medical reason. If you have pain, you have a medical reason to take pain medicine. Your doctor will tell you how much medicine to take. If your doctor wants you to stop the medicine, the dose will be slowly lowered over time to avoid any side effects. You may get drowsy or dizzy when you first start taking this medicine or change doses. Do not drive, use machinery, or do anything that may be dangerous until you know how the medicine affects you. Stand or sit up slowly. There are different types of narcotic medicines (opiates) for pain. If you take more than one type at the same time, you may have more side effects. Give your health care provider a list of all medicines you use. Your doctor will tell you how much medicine to take. Do not take more medicine than directed. Call emergency for help if you have problems breathing. This medicine will cause constipation. Try to have a bowel movement at least every 2 to 3 days. If you do  not have a bowel movement for 3 days, call your doctor or health care professional. Your mouth may get dry. Drinking water, chewing sugarless gum, or sucking on hard candy may help. See your dentist every 6 months. What side effects may I notice from receiving this medicine? Side effects that you should report to your doctor or health care professional as soon as possible: -allergic reactions like skin rash, itching or hives, swelling of the face, lips, or tongue -breathing problems -confusion -feeling faint or lightheaded, falls -trouble passing urine or change in the amount of urine -unusually weak or tired Side effects that usually do not require medical attention (report to your doctor or health care professional if they continue or are bothersome): -constipation -dry mouth -itching -nausea, vomiting -upset stomach This list may not describe all possible side effects. Call your doctor for medical advice about side effects. You may report side effects to FDA at 1-800-FDA-1088. Where should I keep my medicine? Keep out of the reach of children. This medicine can be abused. Keep your medicine in a safe place to protect it from theft. Do not share this  medicine with anyone. Selling or giving away this medicine is dangerous and against the law. Store at room temperature between 15 and 30 degrees C (59 and 86 degrees F). Protect from light. Keep container tightly closed. This medicine may cause accidental overdose and death if it is taken by other adults, children, or pets. Flush any unused medicine down the toilet to reduce the chance of harm. Do not use the medicine after the expiration date. NOTE: This sheet is a summary. It may not cover all possible information. If you have questions about this medicine, talk to your doctor, pharmacist, or health care provider.  2015, [employer] Standard. (2013-01-28 13:43:33)

## 2014-04-27 NOTE — Progress Notes (Signed)
Nutrition Brief Note  Patient identified on the Malnutrition Screening Tool (MST) Report  Wt Readings from Last 15 Encounters:  04/26/14 140 lb (63.504 kg)  04/21/14 140 lb (63.504 kg)  04/20/14 141 lb 8 oz (64.184 kg)  04/13/14 140 lb 4.8 oz (63.64 kg)  04/08/14 142 lb 14.4 oz (64.819 kg)  03/15/14 144 lb (65.318 kg)  03/01/14 143 lb 8 oz (65.091 kg)  01/17/14 144 lb (65.318 kg)  09/01/13 146 lb (66.225 kg)  03/09/13 155 lb 6.4 oz (70.489 kg)  03/04/13 156 lb 6.4 oz (70.943 kg)  03/18/12 152 lb (68.947 kg)  03/14/12 150 lb 6.4 oz (68.221 kg)  07/29/11 155 lb (70.308 kg)  11/29/10 154 lb (69.854 kg)    Body mass index is 25.6 kg/(m^2). Patient meets criteria for overweight based on current BMI.   Current diet order is clear liquid, patient is consuming approximately 70% of meals at this time. Labs and medications reviewed.   Spoke with pt who has been discharged. She reports that her appetite has improved and she feels hungry. She was advised to use nutritional supplements at home if she is not eating well.   No nutrition interventions warranted at this time. If nutrition issues arise, please consult RD.   Laurette Schimke MS, RD, LDN

## 2014-04-27 NOTE — Progress Notes (Signed)
Pt discharge instructions given. IV removed. No further questions. Waiting on daughter in law to pick up.Will continue to monitor.

## 2014-04-27 NOTE — Plan of Care (Signed)
Problem: Phase I Progression Outcomes Goal: Dangle/OOB as tolerated per MD order Outcome: Completed/Met Date Met:  04/27/14

## 2014-04-29 ENCOUNTER — Encounter (HOSPITAL_COMMUNITY): Payer: Self-pay | Admitting: Gynecology

## 2014-04-30 LAB — TYPE AND SCREEN
ABO/RH(D): A NEG
Antibody Screen: POSITIVE
DAT, IgG: NEGATIVE
UNIT DIVISION: 0
Unit division: 0

## 2014-05-03 ENCOUNTER — Encounter: Payer: Self-pay | Admitting: Gynecologic Oncology

## 2014-05-03 ENCOUNTER — Ambulatory Visit: Payer: Commercial Managed Care - HMO | Attending: Gynecologic Oncology | Admitting: Gynecologic Oncology

## 2014-05-03 ENCOUNTER — Telehealth: Payer: Self-pay | Admitting: *Deleted

## 2014-05-03 VITALS — BP 190/82 | HR 66 | Temp 98.1°F | Resp 16 | Ht 62.0 in | Wt 141.6 lb

## 2014-05-03 DIAGNOSIS — F17200 Nicotine dependence, unspecified, uncomplicated: Secondary | ICD-10-CM | POA: Diagnosis not present

## 2014-05-03 DIAGNOSIS — Z90722 Acquired absence of ovaries, bilateral: Secondary | ICD-10-CM | POA: Diagnosis present

## 2014-05-03 DIAGNOSIS — Z7982 Long term (current) use of aspirin: Secondary | ICD-10-CM | POA: Insufficient documentation

## 2014-05-03 DIAGNOSIS — D3911 Neoplasm of uncertain behavior of right ovary: Secondary | ICD-10-CM

## 2014-05-03 DIAGNOSIS — B373 Candidiasis of vulva and vagina: Secondary | ICD-10-CM

## 2014-05-03 DIAGNOSIS — Z79899 Other long term (current) drug therapy: Secondary | ICD-10-CM | POA: Diagnosis not present

## 2014-05-03 DIAGNOSIS — B3731 Acute candidiasis of vulva and vagina: Secondary | ICD-10-CM

## 2014-05-03 MED ORDER — FLUCONAZOLE 150 MG PO TABS
150.0000 mg | ORAL_TABLET | Freq: Once | ORAL | Status: DC
Start: 2014-05-03 — End: 2014-05-30

## 2014-05-03 MED ORDER — MICONAZOLE NITRATE 2 % EX CREA: 1.0000 "application " | TOPICAL_CREAM | Freq: Two times a day (BID) | CUTANEOUS | Status: DC | PRN

## 2014-05-03 NOTE — Progress Notes (Addendum)
Follow Up Note: Gyn-Onc  Tammy Brooks 80 y.o. female  CC:  Chief Complaint  Patient presents with  . Suture / Staple Removal    Post-op    HPI: Tammy Brooks is a 80 year old referred by Dr. Pamala Hurry for evaluation of a newly diagnosed complex right pelvic mass.  She underwent a CT scan to assess an abdominal aortic aneurysm on 03/07/2014. An incidental finding was made of a 9 cm solid and cystic right adnexal mass. CT did not show any evidence of adenopathy, ascites, or any other evidence of metastatic disease. CA-125 value is 8.1 units per mL. The patient has no past gynecologic history except for undergoing a vaginal hysterectomy and previous tubal ligation. She denies any family history of ovarian cancer.  On 04/26/14, she underwent bilateral salpingo-oophorectomy with Dr. Fermin Schwab.  Her post-operative course was uneventful.  Final pathology revealed: 1. Ovary and fallopian tube, right - GRANULOSA CELL TUMOR. - BENIGN FALLOPIAN TUBE WITH PARATUBAL CYST. - SEE ONCOLOGY TABLE.  2. Ovary and fallopian tube, left - BENIGN OVARY WITH INCLUSION CYSTS. - BENIGN FALLOPIAN TUBE WITH PARATUBAL CYSTS.    Interval History:  She presents today with her daughter-in-law for post-operative follow up and staple removal.  She states she has been doing well except for intermittent constipation resolved with MOM and mild left ankle edema that was present before surgery which she relates to her gout.  Patient describes expected post operative status.  Adequate PO intake reported.  Bladder functioning without difficulty.  Pain minimal. Reporting vaginal itching and burning at the introitus.  She had a recent yeast infection and reports similar symptoms.  Stating she had not taken her BP medication this am because she was rushing at the house to get ready.  No symptoms reported related to her elevated BP at this time including chest pain, visual changes.  No other concerns voiced.      Review of Systems   Constitutional: Feels well.  No fever, chills, early satiety, or unintentional weight loss or gain.  Cardiovascular: No chest pain, shortness of breath.  Mild edema in the L ankle related to gout.  Pulmonary: No cough or wheeze.  Gastrointestinal: No nausea, vomiting, or diarrhea. No bright red blood per rectum or change in bowel movement.  Genitourinary: No frequency, urgency, or dysuria. No vaginal bleeding.  Positive for vaginal itching.  Musculoskeletal: No myalgia or joint pain. Neurologic: No weakness, numbness, or change in gait.  Psychology: No depression, anxiety, or insomnia.  Current Meds:  Outpatient Encounter Prescriptions as of 05/03/2014  Medication Sig  . allopurinol (ZYLOPRIM) 100 MG tablet Take 1 tablet (100 mg total) by mouth daily.  Marland Kitchen aspirin EC 81 MG tablet Take 81 mg by mouth daily.  . colchicine 0.6 MG tablet Take 1 tablet (0.6 mg total) by mouth 4 (four) times daily as needed. (Patient taking differently: Take 0.6 mg by mouth 4 (four) times daily as needed (Gout). )  . diltiazem (CARDIZEM) 120 MG tablet Take 1 tablet (120 mg total) by mouth daily. (Patient taking differently: Take 120 mg by mouth every morning. )  . dorzolamide-timolol (COSOPT) 22.3-6.8 MG/ML ophthalmic solution Place 1 drop into the left eye daily.    Marland Kitchen lisinopril (PRINIVIL,ZESTRIL) 20 MG tablet Take 1 tablet (20 mg total) by mouth 2 (two) times daily.  . meloxicam (MOBIC) 15 MG tablet Take 1 tablet (15 mg total) by mouth daily as needed for pain.  . metoprolol (LOPRESSOR) 50 MG tablet Take 1  tablet (50 mg total) by mouth 2 (two) times daily. (Patient taking differently: Take 25 mg by mouth 2 (two) times daily. )  . oxyCODONE (OXY IR/ROXICODONE) 5 MG immediate release tablet Take 1-2 tablets (5-10 mg total) by mouth every 4 (four) hours as needed for severe pain.  . simvastatin (ZOCOR) 40 MG tablet Take 1 tablet (40 mg total) by mouth at bedtime.  . [DISCONTINUED] polyethylene glycol (MIRALAX /  GLYCOLAX) packet Take 17 g by mouth daily.  . fluconazole (DIFLUCAN) 150 MG tablet Take 1 tablet (150 mg total) by mouth once.  . miconazole (MICOTIN) 2 % cream Apply 1 application topically 2 (two) times daily as needed. To the vulva    Allergy:  Allergies  Allergen Reactions  . Asa [Aspirin]     Stomach ulcers  . Nabumetone Other (See Comments)    Causes inflammation    Social Hx:   History   Social History  . Marital Status: Widowed    Spouse Name: N/A    Number of Children: 1  . Years of Education: 9   Occupational History  . Oncologist     retired   Social History Main Topics  . Smoking status: Current Every Day Smoker -- 1.00 packs/day for 62 years    Types: Cigarettes  . Smokeless tobacco: Never Used  . Alcohol Use: 0.0 oz/week     Comment: occasionally - few drinks/month  . Drug Use: No  . Sexual Activity: Not Currently   Other Topics Concern  . Not on file   Social History Narrative   9th grade. married '54- widowed Dec 10th'08. 4 sons - '55, '58, '60, '69; 1 daughter - '63; 12 grandchildren; 9 great-grandchildren. 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 Other children are close and attentive. ACP/End-of-life: No CPR, no mechanical ventilation, no futile/heroic measures.    Past Surgical Hx:  Past Surgical History  Procedure Laterality Date  . Cataract extraction w/ intraocular lens  implant, bilateral    . Eye surgery      lense implant removed/ blind in left eye  . Upper gastrointestinal endoscopy      treated for h-pylori  . Colonoscopy    . Polypectomy    . Tubal ligation    . Appendectomy    . Tonsillectomy and adenoidectomy    . Abdominal hysterectomy    . Cardiac catheterization  08/1995    50-55% second diag lesion, occluded OM  . Retinal detachment surgery  2006  . Laparotomy N/A 04/26/2014    Procedure: EXPLORATORY LAPAROTOMY/RESECTION OF PELVIC MASS;  Surgeon: Alvino Chapel, MD;   Location: [facility];  Service: Gynecology;  Laterality: N/A;    Past Medical Hx:  Past Medical History  Diagnosis Date  . Arthritis   . Blind left eye   . Heart murmur   . Hyperlipidemia   . Hypertension   . Myocardial infarction     mild/ age 89  . Ulcer   . CAD (coronary artery disease) 08/1995    50-55% second diag dz.; occluded OM  . PAD (peripheral artery disease)     mild to mod Rt ICA stenosis followed by doppler-02/05/12  . Echocardiogram abnormal 03/2012    mild to mod AS, valve area1.58 cm2, mild concentric LVH, grade I diastolic dusfunction  . Tobacco abuse   . Abdominal aortic aneurysm     followed by Dr. Sherren Mocha Early  . Complication of anesthesia     "  Blood pressure went down" with retinal surgery - no problems with previous surgeries  . Stroke 2001    no residual problems  . Gout   . Pelvic mass     Family Hx:  Family History  Problem Relation Age of Onset  . Aneurysm Mother     AAA  . Hypertension Mother   . Heart disease Father     CAD, MI x 3  . Heart attack Father   . Cancer Maternal Aunt     breast cancer    Vitals:  Blood pressure 190/82, pulse 66, temperature 98.1 F (36.7 C), temperature source Oral, resp. rate 16, height 5\' 2"  (1.575 m), weight 141 lb 9.6 oz (64.229 kg).  Physical Exam:  General: Well developed, well nourished female in no acute distress. Alert and oriented x 3.  Cardiovascular: Regular rate and rhythm. Systolic murmur noted-not a new finding.  Lungs: Clear to auscultation bilaterally. No wheezes/crackles/rhonchi noted.  Skin: No rashes or lesions present. Back: No CVA tenderness.  Abdomen: Abdomen soft, non-tender and non-obese. Active bowel sounds in all quadrants. No evidence of a fluid wave or abdominal masses.  15 staples removed from the midline incision without difficulty.  1/2 inch steri strips applied.  No erythema or drainage present.  Genitourinary:    Vulva/vagina: External female genitalia with candidiasis present  with mild erythema.    Extremities: No bilateral cyanosis, edema, or clubbing.   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  She is doing well post-operatively.  Reportable signs and symptoms reviewed.  She is advised to take her blood pressure medication as soon as possible and to check her blood pressure several hours after and call the office.  She is to follow up as scheduled or sooner if needed.  She is advised to call for any questions or concerns.  Diflucan one time dose along with external miconazole cream sent to her pharmacy and she is to contact the office if symptoms persist after three to four days.   Jamiracle Avants DEAL, NP 05/03/2014, 4:01 PM

## 2014-05-03 NOTE — Telephone Encounter (Signed)
Called pt to see if her BP was lower since getting home and taking her BP meds. Per pt, she just got home - ran errands after appt this morning. Told pt to check her BP tonight and again in the morning and give [place] a call back with the results. Pt agreeable to this.

## 2014-05-03 NOTE — Patient Instructions (Signed)
Please call for any questions or concerns.  Continue post-operative instructions including no heavy lifting, no tub baths, nothing in the vagina.  Call in three days if your vulvar itching/irritation does not resolve.

## 2014-05-04 NOTE — Telephone Encounter (Signed)
Tammy Brooks called back to let us know that her blood pressure is better. She took it last night and this morning on her home BP machine - last night it was 162/62 and this morning around 10:30 it was 117/49. Told [name] thanks for letting us know and encouraged her to continue checking her BP at home.

## 2014-05-30 ENCOUNTER — Encounter: Payer: Self-pay | Admitting: Gynecology

## 2014-05-30 ENCOUNTER — Ambulatory Visit: Payer: Commercial Managed Care - HMO

## 2014-05-30 ENCOUNTER — Ambulatory Visit: Payer: Commercial Managed Care - HMO | Attending: Gynecology | Admitting: Gynecology

## 2014-05-30 VITALS — BP 166/66 | HR 47 | Temp 97.7°F | Resp 20 | Ht 62.0 in | Wt 141.5 lb

## 2014-05-30 DIAGNOSIS — Z9079 Acquired absence of other genital organ(s): Secondary | ICD-10-CM | POA: Insufficient documentation

## 2014-05-30 DIAGNOSIS — I10 Essential (primary) hypertension: Secondary | ICD-10-CM | POA: Insufficient documentation

## 2014-05-30 DIAGNOSIS — I714 Abdominal aortic aneurysm, without rupture: Secondary | ICD-10-CM | POA: Diagnosis not present

## 2014-05-30 DIAGNOSIS — D3911 Neoplasm of uncertain behavior of right ovary: Secondary | ICD-10-CM

## 2014-05-30 DIAGNOSIS — Z7982 Long term (current) use of aspirin: Secondary | ICD-10-CM | POA: Diagnosis not present

## 2014-05-30 DIAGNOSIS — Z79899 Other long term (current) drug therapy: Secondary | ICD-10-CM | POA: Insufficient documentation

## 2014-05-30 DIAGNOSIS — Z8673 Personal history of transient ischemic attack (TIA), and cerebral infarction without residual deficits: Secondary | ICD-10-CM | POA: Diagnosis not present

## 2014-05-30 DIAGNOSIS — I2510 Atherosclerotic heart disease of native coronary artery without angina pectoris: Secondary | ICD-10-CM | POA: Diagnosis not present

## 2014-05-30 DIAGNOSIS — Z09 Encounter for follow-up examination after completed treatment for conditions other than malignant neoplasm: Secondary | ICD-10-CM | POA: Diagnosis not present

## 2014-05-30 DIAGNOSIS — Z90710 Acquired absence of both cervix and uterus: Secondary | ICD-10-CM | POA: Diagnosis not present

## 2014-05-30 DIAGNOSIS — I252 Old myocardial infarction: Secondary | ICD-10-CM | POA: Insufficient documentation

## 2014-05-30 DIAGNOSIS — F17210 Nicotine dependence, cigarettes, uncomplicated: Secondary | ICD-10-CM | POA: Insufficient documentation

## 2014-05-30 DIAGNOSIS — Z90722 Acquired absence of ovaries, bilateral: Secondary | ICD-10-CM | POA: Diagnosis not present

## 2014-05-30 DIAGNOSIS — E785 Hyperlipidemia, unspecified: Secondary | ICD-10-CM | POA: Diagnosis not present

## 2014-05-30 DIAGNOSIS — N951 Menopausal and female climacteric states: Secondary | ICD-10-CM

## 2014-05-30 DIAGNOSIS — R1900 Intra-abdominal and pelvic swelling, mass and lump, unspecified site: Secondary | ICD-10-CM

## 2014-05-30 NOTE — Patient Instructions (Addendum)
You may return to full levels of activity. Return to see Dr. Fermin Schwab in 6 months.

## 2014-05-30 NOTE — Progress Notes (Signed)
Consult Note: Gyn-Onc   Tammy Brooks 80 y.o. female  No chief complaint on file.   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  Plan: We will obtain a baseline inhibin B and AMH today. The patient's given a prescription for Estrace 0.5 mg daily. She'll contact us if the hot flushes are not better within the next 2 weeks. She return to see me in 6 months and we will repeat tumor markers at each visit.  Interval history: Age returns today for her first postoperative checkup. She reports she's returning nearly full levels of activity. She is not having any pain. She denies any GI or GU symptoms. Her main complaint is that of hot flushes which she says occurs every 20 minutes.  HPI:  80 year old white female seen in consultation at the request of Dr. Aloha Gell for evaluation of a newly diagnosed complex right pelvic mass.  The patient underwent a CT scan to assess an abdominal aortic aneurysm on 03/07/2014. An incidental finding was made of a 9 cm solid and cystic right adnexal mass. CT did not show any evidence of adenopathy, ascites, or any other evidence of metastatic disease. CA-125 value is 8.1 units per mL. The patient has no past gynecologic history except for undergoing a vaginal hysterectomy and previous tubal ligation. She denies any family history of ovarian cancer.  Patient denies any pelvic pain pressure or any other GI or GU symptoms.  She does have significant medical comorbidities especially vascular. The aortic aneurysm measures 4.1 x 4.3 cm with extensive mural thrombus throughout the aneurysm.  Patient underwent exploratory laparotomy and bilateral salpingo-oophorectomy on 04/26/2014. 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 She had on, gait a postoperative course.  Review of Systems:10 point review of systems is negative except as noted in interval history.   Vitals: There were no vitals  taken for this visit.  Physical Exam: General : The patient is a healthy woman in no acute distress.  HEENT: normocephalic, extraoccular movements normal; neck is supple without thyromegally  Lynphnodes: Supraclavicular and inguinal nodes not enlarged  Abdomen: Soft, non-tender, no ascites, no organomegally, no masses, no hernias, midline incision is well-healed.  Pelvic:  EGBUS: Normal female  Vagina: Normal, no lesions  Urethra and Bladder: Normal, non-tender  Cervix: Surgically absent  Uterus: Surgically absent  Bi-manual examination: No masses  Rectal: normal sphincter tone, no masses, no blood  Lower extremities: No edema or varicosities. Normal range of motion      Allergies  Allergen Reactions  . Asa [Aspirin]     Stomach ulcers  . Nabumetone Other (See Comments)    Causes inflammation    Past Medical History  Diagnosis Date  . Arthritis   . Blind left eye   . Heart murmur   . Hyperlipidemia   . Hypertension   . Myocardial infarction     mild/ age 38  . Ulcer   . CAD (coronary artery disease) 08/1995    50-55% second diag dz.; occluded OM  . PAD (peripheral artery disease)     mild to mod Rt ICA stenosis followed by doppler-02/05/12  . Echocardiogram abnormal 03/2012    mild to mod AS, valve area1.58 cm2, mild concentric LVH, grade I diastolic dusfunction  . Tobacco abuse   . Abdominal aortic aneurysm     followed by Dr. Sherren Mocha Early  . Complication of anesthesia     "Blood  pressure went down" with retinal surgery - no problems with previous surgeries  . Stroke 2001    no residual problems  . Gout   . Pelvic mass     Past Surgical History  Procedure Laterality Date  . Cataract extraction w/ intraocular lens  implant, bilateral    . Eye surgery      lense implant removed/ blind in left eye  . Upper gastrointestinal endoscopy      treated for h-pylori  . Colonoscopy    . Polypectomy    . Tubal ligation    . Appendectomy    . Tonsillectomy and  adenoidectomy    . Abdominal hysterectomy    . Cardiac catheterization  08/1995    50-55% second diag lesion, occluded OM  . Retinal detachment surgery  2006  . Laparotomy N/A 04/26/2014    Procedure: EXPLORATORY LAPAROTOMY/RESECTION OF PELVIC MASS;  Surgeon: Alvino Chapel, MD;  Location: [facility];  Service: Gynecology;  Laterality: N/A;    Current Outpatient Prescriptions  Medication Sig Dispense Refill  . allopurinol (ZYLOPRIM) 100 MG tablet Take 1 tablet (100 mg total) by mouth daily. 90 tablet 3  . aspirin EC 81 MG tablet Take 81 mg by mouth daily.    . colchicine 0.6 MG tablet Take 1 tablet (0.6 mg total) by mouth 4 (four) times daily as needed. (Patient taking differently: Take 0.6 mg by mouth 4 (four) times daily as needed (Gout). ) 60 tablet 3  . diltiazem (CARDIZEM) 120 MG tablet Take 1 tablet (120 mg total) by mouth daily. (Patient taking differently: Take 120 mg by mouth every morning. ) 90 tablet 3  . dorzolamide-timolol (COSOPT) 22.3-6.8 MG/ML ophthalmic solution Place 1 drop into the left eye daily.      . fluconazole (DIFLUCAN) 150 MG tablet Take 1 tablet (150 mg total) by mouth once. 1 tablet 1  . lisinopril (PRINIVIL,ZESTRIL) 20 MG tablet Take 1 tablet (20 mg total) by mouth 2 (two) times daily. 180 tablet 3  . meloxicam (MOBIC) 15 MG tablet Take 1 tablet (15 mg total) by mouth daily as needed for pain. 90 tablet 1  . metoprolol (LOPRESSOR) 50 MG tablet Take 1 tablet (50 mg total) by mouth 2 (two) times daily. (Patient taking differently: Take 25 mg by mouth 2 (two) times daily. ) 180 tablet 3  . miconazole (MICOTIN) 2 % cream Apply 1 application topically 2 (two) times daily as needed. To the vulva 28.35 g 0  . oxyCODONE (OXY IR/ROXICODONE) 5 MG immediate release tablet Take 1-2 tablets (5-10 mg total) by mouth every 4 (four) hours as needed for severe pain. 30 tablet 0  . simvastatin (ZOCOR) 40 MG tablet Take 1 tablet (40 mg total) by mouth at bedtime. 90 tablet 3    No current facility-administered medications for this visit.    History   Social History  . Marital Status: Widowed    Spouse Name: N/A    Number of Children: 39  . Years of Education: 9   Occupational History  . Oncologist     retired   Social History Main Topics  . Smoking status: Current Every Day Smoker -- 1.00 packs/day for 62 years    Types: Cigarettes  . Smokeless tobacco: Never Used  . Alcohol Use: 0.0 oz/week     Comment: occasionally - few drinks/month  . Drug Use: No  . Sexual Activity: Not Currently   Other Topics Concern  . Not on file   Social  History Narrative   9th grade. married '54- widowed Dec 10th'08. 4 sons - '55, '58, '60, '69; 1 daughter - '63; 12 grandchildren; 9 great-grandchildren. 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 Other children are close and attentive. ACP/End-of-life: No CPR, no mechanical ventilation, no futile/heroic measures.    Family History  Problem Relation Age of Onset  . Aneurysm Mother     AAA  . Hypertension Mother   . Heart disease Father     CAD, MI x 3  . Heart attack Father   . Cancer Maternal Aunt     breast cancer      CLARKE-PEARSON,Demica Zook L, MD 05/30/2014, 10:41 AM

## 2014-05-31 MED ORDER — ESTRADIOL 0.5 MG PO TABS: 0.5000 mg | ORAL_TABLET | Freq: Every day | ORAL | Status: DC

## 2014-05-31 NOTE — Addendum Note (Signed)
Addended by: Christa See on: 05/31/2014 09:26 AM   Modules accepted: Orders

## 2014-06-02 ENCOUNTER — Encounter: Payer: Self-pay | Admitting: Internal Medicine

## 2014-06-02 ENCOUNTER — Ambulatory Visit (INDEPENDENT_AMBULATORY_CARE_PROVIDER_SITE_OTHER): Payer: Commercial Managed Care - HMO | Admitting: Internal Medicine

## 2014-06-02 VITALS — BP 150/80 | HR 60 | Temp 97.6°F | Resp 16 | Ht 62.0 in | Wt 142.0 lb

## 2014-06-02 DIAGNOSIS — I10 Essential (primary) hypertension: Secondary | ICD-10-CM

## 2014-06-02 DIAGNOSIS — I714 Abdominal aortic aneurysm, without rupture, unspecified: Secondary | ICD-10-CM

## 2014-06-02 DIAGNOSIS — R1900 Intra-abdominal and pelvic swelling, mass and lump, unspecified site: Secondary | ICD-10-CM

## 2014-06-02 DIAGNOSIS — I2510 Atherosclerotic heart disease of native coronary artery without angina pectoris: Secondary | ICD-10-CM

## 2014-06-02 DIAGNOSIS — F17200 Nicotine dependence, unspecified, uncomplicated: Secondary | ICD-10-CM

## 2014-06-02 DIAGNOSIS — Z720 Tobacco use: Secondary | ICD-10-CM

## 2014-06-02 DIAGNOSIS — M545 Low back pain, unspecified: Secondary | ICD-10-CM | POA: Insufficient documentation

## 2014-06-02 DIAGNOSIS — E785 Hyperlipidemia, unspecified: Secondary | ICD-10-CM

## 2014-06-02 DIAGNOSIS — R001 Bradycardia, unspecified: Secondary | ICD-10-CM | POA: Insufficient documentation

## 2014-06-02 MED ORDER — LISINOPRIL 20 MG PO TABS: 20.0000 mg | ORAL_TABLET | Freq: Two times a day (BID) | ORAL | Status: DC

## 2014-06-02 MED ORDER — ALLOPURINOL 100 MG PO TABS: 100.0000 mg | ORAL_TABLET | Freq: Every day | ORAL | Status: DC

## 2014-06-02 MED ORDER — DILTIAZEM HCL 120 MG PO TABS: 120.0000 mg | ORAL_TABLET | Freq: Two times a day (BID) | ORAL | Status: DC

## 2014-06-02 MED ORDER — METOPROLOL TARTRATE 50 MG PO TABS: 25.0000 mg | ORAL_TABLET | Freq: Two times a day (BID) | ORAL | Status: DC

## 2014-06-02 MED ORDER — CYCLOBENZAPRINE HCL 5 MG PO TABS: 5.0000 mg | ORAL_TABLET | Freq: Three times a day (TID) | ORAL | Status: DC | PRN

## 2014-06-02 MED ORDER — DILTIAZEM HCL 120 MG PO TABS: 120.0000 mg | ORAL_TABLET | Freq: Every day | ORAL | Status: DC

## 2014-06-02 MED ORDER — SIMVASTATIN 40 MG PO TABS: 40.0000 mg | ORAL_TABLET | Freq: Every day | ORAL | Status: DC

## 2014-06-02 MED ORDER — MELOXICAM 15 MG PO TABS: 15.0000 mg | ORAL_TABLET | Freq: Every day | ORAL | Status: DC | PRN

## 2014-06-02 MED ORDER — AMLODIPINE BESYLATE 5 MG PO TABS: 5.0000 mg | ORAL_TABLET | Freq: Every day | ORAL | Status: DC

## 2014-06-02 MED ORDER — SIMVASTATIN 40 MG PO TABS: 20.0000 mg | ORAL_TABLET | Freq: Every day | ORAL | Status: DC

## 2014-06-02 MED ORDER — VITAMIN D 1000 UNITS PO TABS: 1000.0000 [IU] | ORAL_TABLET | Freq: Every day | ORAL | Status: AC

## 2014-06-02 NOTE — Assessment & Plan Note (Addendum)
12/15 D/c diltiazem and start Norvasc

## 2014-06-02 NOTE — Assessment & Plan Note (Signed)
12/15 D/c diltiazem and start Norvasc

## 2014-06-02 NOTE — Assessment & Plan Note (Signed)
Flexeril prn X ray if needed

## 2014-06-02 NOTE — Assessment & Plan Note (Signed)
F/u w/Dr Early

## 2014-06-02 NOTE — Assessment & Plan Note (Signed)
Doing a little better

## 2014-06-02 NOTE — Assessment & Plan Note (Signed)
She is s/p hysterectomy for ovarian tumor 04/26/14 Dr Aldean Ast

## 2014-06-02 NOTE — Assessment & Plan Note (Signed)
Continue with current prescription therapy as reflected on the Med list.  

## 2014-06-02 NOTE — Assessment & Plan Note (Signed)
Reduced Zocor to 20 mg/d due to potential drug-drug interaction

## 2014-06-02 NOTE — Progress Notes (Signed)
   Subjective:    HPI  She is s/p hysterectomy for ovarian tumor 04/26/14 Dr Aldean Ast C/o LBP - chronic F/u gout in B feet, OA, AS, AAA, CAD She is a smoker  HR rechecked 60 bpm  BP Readings from Last 3 Encounters:  06/02/14 150/80  05/30/14 166/66  05/03/14 190/82   Wt Readings from Last 3 Encounters:  06/02/14 142 lb (64.411 kg)  05/30/14 141 lb 8 oz (64.184 kg)  05/03/14 141 lb 9.6 oz (64.229 kg)      Review of Systems  Constitutional: Negative for chills, activity change, appetite change, fatigue and unexpected weight change.  HENT: Negative for congestion, mouth sores and sinus pressure.   Eyes: Negative for visual disturbance.  Respiratory: Negative for cough and chest tightness.   Gastrointestinal: Negative for nausea and abdominal pain.  Genitourinary: Positive for vaginal bleeding. Negative for frequency, difficulty urinating and vaginal pain.  Musculoskeletal: Negative for back pain and gait problem.  Skin: Negative for pallor and rash.  Neurological: Negative for dizziness, tremors, weakness, numbness and headaches.  Psychiatric/Behavioral: Negative for confusion and sleep disturbance. The patient is not nervous/anxious.        Objective:   Physical Exam  Constitutional: She appears well-developed. No distress.  HENT:  Head: Normocephalic.  Right Ear: External ear normal.  Left Ear: External ear normal.  Nose: Nose normal.  Mouth/Throat: Oropharynx is clear and moist.  Eyes: Conjunctivae are normal. Pupils are equal, round, and reactive to light. Right eye exhibits no discharge. Left eye exhibits no discharge.  Neck: Normal range of motion. Neck supple. No JVD present. No tracheal deviation present. No thyromegaly present.  Cardiovascular: Normal rate, regular rhythm and normal heart sounds.   Pulmonary/Chest: No stridor. No respiratory distress. She has no wheezes.  Abdominal: Soft. Bowel sounds are normal. She exhibits no distension and no mass.  There is no tenderness. There is no rebound and no guarding.  Musculoskeletal: She exhibits tenderness. She exhibits no edema.  Lymphadenopathy:    She has no cervical adenopathy.  Neurological: She displays normal reflexes. No cranial nerve deficit. She exhibits normal muscle tone. Coordination normal.  Skin: No rash noted. No erythema.  Psychiatric: She has a normal mood and affect. Her behavior is normal. Judgment and thought content normal.  LS is tender w/ROM Abd scar is healed  Lab Results  Component Value Date   WBC 8.5 04/27/2014   HGB 12.8 04/27/2014   HCT 39.0 04/27/2014   PLT 208 04/27/2014   GLUCOSE 114* 04/27/2014   CHOL 148 01/17/2014   TRIG 116.0 01/17/2014   HDL 38.10* 01/17/2014   LDLDIRECT 76.7 11/17/2008   LDLCALC 87 01/17/2014   ALT 15 04/20/2014   AST 20 04/20/2014   NA 137 04/27/2014   K 4.0 04/27/2014   CL 101 04/27/2014   CREATININE 1.07 04/27/2014   BUN 11 04/27/2014   CO2 27 04/27/2014   TSH 4.57* 01/17/2014          Assessment & Plan:

## 2014-06-06 LAB — ANTI MULLERIAN HORMONE: AMH AssessR: <0.03 ng/mL

## 2014-06-06 LAB — INHIBIN B

## 2014-06-09 ENCOUNTER — Telehealth: Payer: Self-pay | Admitting: *Deleted

## 2014-06-09 NOTE — Telephone Encounter (Signed)
Patient called back. Let patient know that tumor marker results are normal. Patient very appreciative of the call.

## 2014-06-09 NOTE — Telephone Encounter (Signed)
Attempted to reach patient to let her know tumor marker is normal. Left VM for her to call us back at her convenience.

## 2014-06-15 ENCOUNTER — Telehealth: Payer: Self-pay | Admitting: Gynecologic Oncology

## 2014-06-15 NOTE — Addendum Note (Signed)
Addended by: Joylene John D on: 06/15/2014 11:46 AM   Modules accepted: Level of Service

## 2014-06-15 NOTE — Telephone Encounter (Signed)
Received phone from patient stating she received a denial letter from [facility] for her office visit on 04/08/14.  She is told to pay $215.  She reports still having hot flashed but not as often.  Advised that [facility] would be contacted.  Spoke with Liliane Channel at [facility] at [phone].  He states the claim needs to be filed again so it can be uploaded in their system.  Fax number given [phone] Jeanmarie Plant, care of [facility] will be notified to have claim resubmitted.

## 2014-06-30 ENCOUNTER — Telehealth: Payer: Self-pay | Admitting: Internal Medicine

## 2014-06-30 NOTE — Telephone Encounter (Signed)
Pt called in and said that she got letter in the mail stating that her ins is not going to cover cyclobenzaprine (FLEXERIL) 5 MG tablet [[id number] any longer after this month.  What should she do?

## 2014-07-06 NOTE — Telephone Encounter (Signed)
PA for cyclobenzaprine

## 2014-07-14 NOTE — Telephone Encounter (Signed)
PA resent with correct insurance information.

## 2014-07-14 NOTE — Telephone Encounter (Signed)
PA was denied. Not covered by plan.  Pt   [employer]:   ID: [id number]  Rxbin: [id number] Rxpcn: [id number] Rxgrp: [id number]    Pharmacy: [facility] on [address].

## 2014-07-21 NOTE — Telephone Encounter (Signed)
PA for cyclobenzaprine denied.  Pt stated that she does not want to take it any more.

## 2014-08-05 ENCOUNTER — Telehealth: Payer: Self-pay | Admitting: *Deleted

## 2014-08-05 NOTE — Telephone Encounter (Signed)
Patient called asking if she could see Dr. Fermin Schwab sooner than May. She states "there is a lump on the bridge of my vagina about the size of a half of a small cookie." She says she first noticed it in mid February. Denies any pain, tenderness, or other issues in the area. Patient given new appt to see Dr. Fermin Schwab on Friday, 08/12/14 at 1:45pm. Told patient that if she develops any additional symptoms or new concerns to please call before her scheduled appt - patient agreeable to this.

## 2014-08-12 ENCOUNTER — Ambulatory Visit: Payer: Commercial Managed Care - HMO | Admitting: Gynecology

## 2014-08-12 ENCOUNTER — Encounter: Payer: Self-pay | Admitting: Gynecology

## 2014-08-12 ENCOUNTER — Ambulatory Visit: Payer: Medicare Other | Attending: Gynecology | Admitting: Gynecology

## 2014-08-12 VITALS — BP 191/67 | HR 60 | Temp 98.3°F | Resp 18 | Ht 62.0 in | Wt 144.0 lb

## 2014-08-12 DIAGNOSIS — K439 Ventral hernia without obstruction or gangrene: Secondary | ICD-10-CM | POA: Diagnosis not present

## 2014-08-12 DIAGNOSIS — I10 Essential (primary) hypertension: Secondary | ICD-10-CM | POA: Diagnosis not present

## 2014-08-12 DIAGNOSIS — I2510 Atherosclerotic heart disease of native coronary artery without angina pectoris: Secondary | ICD-10-CM | POA: Diagnosis not present

## 2014-08-12 DIAGNOSIS — Z7982 Long term (current) use of aspirin: Secondary | ICD-10-CM | POA: Insufficient documentation

## 2014-08-12 DIAGNOSIS — Z9851 Tubal ligation status: Secondary | ICD-10-CM | POA: Insufficient documentation

## 2014-08-12 DIAGNOSIS — I252 Old myocardial infarction: Secondary | ICD-10-CM | POA: Diagnosis not present

## 2014-08-12 DIAGNOSIS — I714 Abdominal aortic aneurysm, without rupture: Secondary | ICD-10-CM | POA: Insufficient documentation

## 2014-08-12 DIAGNOSIS — Z888 Allergy status to other drugs, medicaments and biological substances status: Secondary | ICD-10-CM | POA: Diagnosis not present

## 2014-08-12 DIAGNOSIS — Z90710 Acquired absence of both cervix and uterus: Secondary | ICD-10-CM | POA: Diagnosis not present

## 2014-08-12 DIAGNOSIS — K432 Incisional hernia without obstruction or gangrene: Secondary | ICD-10-CM | POA: Diagnosis not present

## 2014-08-12 DIAGNOSIS — Z8673 Personal history of transient ischemic attack (TIA), and cerebral infarction without residual deficits: Secondary | ICD-10-CM | POA: Insufficient documentation

## 2014-08-12 DIAGNOSIS — D3910 Neoplasm of uncertain behavior of unspecified ovary: Secondary | ICD-10-CM | POA: Diagnosis not present

## 2014-08-12 DIAGNOSIS — D3911 Neoplasm of uncertain behavior of right ovary: Secondary | ICD-10-CM | POA: Diagnosis not present

## 2014-08-12 DIAGNOSIS — Z79899 Other long term (current) drug therapy: Secondary | ICD-10-CM | POA: Insufficient documentation

## 2014-08-12 DIAGNOSIS — E785 Hyperlipidemia, unspecified: Secondary | ICD-10-CM | POA: Diagnosis not present

## 2014-08-12 NOTE — Progress Notes (Signed)
Consult Note: Gyn-Onc   Tammy Brooks 81 y.o. female  Chief Complaint  Patient presents with  . 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 New ventral hernia at the lower pole of her incision.  Plan: The patient is reassured regarding our findings. She is entirely asymptomatic and therefore would not recommend hernia repair at this time. She is warned about the possibility of bowel obstruction or incarceration and should she develop any symptoms she will contact us immediately. She return to see me in 6 months and we will repeat tumor markers at each visit.  Interval history: Patient returns today prior to scheduled because of new onset of a "lump" that she has noticed for approximately a month. She denies any pain or other symptoms associated with it. The "lump" is located at the lower end of her midline incision. She denies any GI or GU symptoms and has no pelvic pain or pressure. HPI:  81 year old white female seen in consultation at the request of Dr. Aloha Gell for evaluation of a newly diagnosed complex right pelvic mass.  The patient underwent a CT scan to assess an abdominal aortic aneurysm on 03/07/2014. An incidental finding was made of a 9 cm solid and cystic right adnexal mass. CT did not show any evidence of adenopathy, ascites, or any other evidence of metastatic disease. CA-125 value is 8.1 units per mL. The patient has no past gynecologic history except for undergoing a vaginal hysterectomy and previous tubal ligation. She denies any family history of ovarian cancer.  Patient denies any pelvic pain pressure or any other GI or GU symptoms.  She does have significant medical comorbidities especially vascular. The aortic aneurysm measures 4.1 x 4.3 cm with extensive mural thrombus throughout the aneurysm.  Patient underwent exploratory laparotomy and bilateral salpingo-oophorectomy on  04/26/2014. 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 She had an uncomplicated  postoperative course.  Review of Systems:10 point review of systems is negative except as noted in interval history.   Vitals: Blood pressure 191/67, pulse 60, temperature 98.3 F (36.8 C), temperature source Oral, resp. rate 18, height 5\' 2"  (1.575 m), weight 144 lb (65.318 kg).  Physical Exam: General : The patient is a healthy woman in no acute distress.  HEENT: normocephalic, extraoccular movements normal; neck is supple without thyromegally  Lynphnodes: Supraclavicular and inguinal nodes not enlarged  Abdomen: Soft, non-tender, no ascites, no organomegally, no masses, no hernias, midline incision is well-healed. There is an easily reducible ventral hernia the lower aspect of the midline incision. Pelvic:  EGBUS: Normal female  Vagina: Normal, no lesions  Urethra and Bladder: Normal, non-tender  Cervix: Surgically absent  Uterus: Surgically absent  Bi-manual examination: No masses  Rectal: normal sphincter tone, no masses, no blood  Lower extremities: No edema or varicosities. Normal range of motion      Allergies  Allergen Reactions  . Nabumetone Other (See Comments)    Causes inflammation    Past Medical History  Diagnosis Date  . Arthritis   . Blind left eye   . Heart murmur   . Hyperlipidemia   . Hypertension   . Myocardial infarction     mild/ age 68  . Ulcer   . CAD (coronary artery disease) 08/1995    50-55% second diag dz.; occluded OM  . PAD (peripheral artery disease)  mild to mod Rt ICA stenosis followed by doppler-02/05/12  . Echocardiogram abnormal 03/2012    mild to mod AS, valve area1.58 cm2, mild concentric LVH, grade I diastolic dusfunction  . Tobacco abuse   . Abdominal aortic aneurysm     followed by Dr. Sherren Mocha Early  . Complication of anesthesia     "Blood pressure went down" with retinal surgery - no problems with previous surgeries  .  Stroke 2001    no residual problems  . Gout   . Pelvic mass     Past Surgical History  Procedure Laterality Date  . Cataract extraction w/ intraocular lens  implant, bilateral    . Eye surgery      lense implant removed/ blind in left eye  . Upper gastrointestinal endoscopy      treated for h-pylori  . Colonoscopy    . Polypectomy    . Tubal ligation    . Appendectomy    . Tonsillectomy and adenoidectomy    . Abdominal hysterectomy    . Cardiac catheterization  08/1995    50-55% second diag lesion, occluded OM  . Retinal detachment surgery  2006  . Laparotomy N/A 04/26/2014    Procedure: EXPLORATORY LAPAROTOMY/RESECTION OF PELVIC MASS;  Surgeon: Alvino Chapel, MD;  Location: [facility];  Service: Gynecology;  Laterality: N/A;    Current Outpatient Prescriptions  Medication Sig Dispense Refill  . allopurinol (ZYLOPRIM) 100 MG tablet Take 1 tablet (100 mg total) by mouth daily. 90 tablet 3  . amLODipine (NORVASC) 5 MG tablet Take 1 tablet (5 mg total) by mouth daily. 90 tablet 3  . aspirin EC 81 MG tablet Take 81 mg by mouth daily.    . cholecalciferol (VITAMIN D) 1000 UNITS tablet Take 1 tablet (1,000 Units total) by mouth daily. 100 tablet 3  . colchicine 0.6 MG tablet Take 1 tablet (0.6 mg total) by mouth 4 (four) times daily as needed. 60 tablet 3  . dorzolamide-timolol (COSOPT) 22.3-6.8 MG/ML ophthalmic solution Place 1 drop into the left eye daily.      Marland Kitchen estradiol (ESTRACE) 0.5 MG tablet Take 1 tablet (0.5 mg total) by mouth daily. 60 tablet 6  . lisinopril (PRINIVIL,ZESTRIL) 20 MG tablet Take 1 tablet (20 mg total) by mouth 2 (two) times daily. 180 tablet 3  . meloxicam (MOBIC) 15 MG tablet Take 1 tablet (15 mg total) by mouth daily as needed for pain. 90 tablet 1  . metoprolol (LOPRESSOR) 50 MG tablet Take 0.5 tablets (25 mg total) by mouth 2 (two) times daily. 90 tablet 3  . simvastatin (ZOCOR) 40 MG tablet Take 0.5 tablets (20 mg total) by mouth at bedtime. 90  tablet 3  . cyclobenzaprine (FLEXERIL) 5 MG tablet Take 1 tablet (5 mg total) by mouth 3 (three) times daily as needed for muscle spasms. (Patient not taking: Reported on 08/12/2014) 60 tablet 1   No current facility-administered medications for this visit.    History   Social History  . Marital Status: Widowed    Spouse Name: N/A  . Number of Children: 5  . Years of Education: 9   Occupational History  . Oncologist     retired   Social History Main Topics  . Smoking status: Current Every Day Smoker -- 1.00 packs/day for 62 years    Types: Cigarettes  . Smokeless tobacco: Never Used  . Alcohol Use: 0.0 oz/week     Comment: occasionally - few drinks/month  . Drug Use: No  .  Sexual Activity: Not Currently   Other Topics Concern  . Not on file   Social History Narrative   9th grade. married '54- widowed Dec 10th'08. 4 sons - '55, '58, '60, '69; 1 daughter - '63; 12 grandchildren; 9 great-grandchildren. 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 Other children are close and attentive. ACP/End-of-life: No CPR, no mechanical ventilation, no futile/heroic measures.    Family History  Problem Relation Age of Onset  . Aneurysm Mother     AAA  . Hypertension Mother   . Heart disease Father     CAD, MI x 3  . Heart attack Father   . Cancer Maternal Aunt     breast cancer      Alvino Chapel, MD 08/12/2014, 2:22 PM

## 2014-08-12 NOTE — Patient Instructions (Addendum)
Plan to follow up in six months or sooner if needed.  Call for any worsening abdominal pain or swelling.  Please call in two to three months to schedule an appointment for Sept 2016.  Hernia A hernia happens when an organ inside your body pushes out through a weak spot in your belly (abdominal) wall. Most hernias get worse over time. They can often be pushed back into place (reduced). Surgery may be needed to repair hernias that cannot be pushed into place. HOME CARE  Keep doing normal activities.  Avoid lifting more than 10 pounds (4.5 kilograms).  Cough gently and avoid straining. Over time, these things will:  Increase your hernia size.  Irritate your hernia.  Break down hernia repairs.  Stop smoking.  Do not wear anything tight over your hernia. Do not keep the hernia in with an outside bandage.  Eat food that is high in fiber (fruit, vegetables, whole grains).  Drink enough fluids to keep your pee (urine) clear or pale yellow.  Take medicines to make your poop soft (stool softeners) if you cannot poop (constipated). GET HELP RIGHT AWAY IF:   You have a fever.  You have belly pain that gets worse.  You feel sick to your stomach (nauseous) and throw up (vomit).  Your skin starts to bulge out.  Your hernia turns a different color, feels hard, or is tender.  You have increased pain or puffiness (swelling) around the hernia.  You poop more or less often.  Your poop does not look the way normally does.  You have watery poop (diarrhea).  You cannot push the hernia back in place by applying gentle pressure while lying down. MAKE SURE YOU:   Understand these instructions.  Will watch your condition.  Will get help right away if you are not doing well or get worse. Document Released: 11/07/2009 Document Revised: 08/12/2011 Document Reviewed: 11/07/2009 [facility] Patient Information 2015 [place], [place]. This information is not intended to replace advice given to you  by your health care provider. Make sure you discuss any questions you have with your health care provider.

## 2014-08-25 ENCOUNTER — Other Ambulatory Visit: Payer: Self-pay | Admitting: *Deleted

## 2014-08-25 MED ORDER — DORZOLAMIDE HCL-TIMOLOL MAL 2-0.5 % OP SOLN: 1.0000 [drp] | Freq: Every day | OPHTHALMIC | Status: DC

## 2014-08-25 MED ORDER — LISINOPRIL 20 MG PO TABS: 20.0000 mg | ORAL_TABLET | Freq: Two times a day (BID) | ORAL | Status: DC

## 2014-08-25 MED ORDER — ALLOPURINOL 100 MG PO TABS: 100.0000 mg | ORAL_TABLET | Freq: Every day | ORAL | Status: DC

## 2014-08-25 MED ORDER — MELOXICAM 15 MG PO TABS: 15.0000 mg | ORAL_TABLET | Freq: Every day | ORAL | Status: DC | PRN

## 2014-08-25 MED ORDER — COLCHICINE 0.6 MG PO TABS
0.6000 mg | ORAL_TABLET | Freq: Four times a day (QID) | ORAL | Status: DC | PRN
Start: 2014-08-25 — End: 2020-01-12

## 2014-08-25 MED ORDER — ESTRADIOL 0.5 MG PO TABS: 0.5000 mg | ORAL_TABLET | Freq: Every day | ORAL | Status: DC

## 2014-08-25 MED ORDER — METOPROLOL TARTRATE 50 MG PO TABS: 25.0000 mg | ORAL_TABLET | Freq: Two times a day (BID) | ORAL | Status: DC

## 2014-08-25 MED ORDER — SIMVASTATIN 40 MG PO TABS: 20.0000 mg | ORAL_TABLET | Freq: Every day | ORAL | Status: DC

## 2014-08-25 MED ORDER — AMLODIPINE BESYLATE 5 MG PO TABS: 5.0000 mg | ORAL_TABLET | Freq: Every day | ORAL | Status: DC

## 2014-08-30 ENCOUNTER — Encounter: Payer: Self-pay | Admitting: Internal Medicine

## 2014-08-30 ENCOUNTER — Ambulatory Visit (INDEPENDENT_AMBULATORY_CARE_PROVIDER_SITE_OTHER): Payer: Medicare Other | Admitting: Internal Medicine

## 2014-08-30 ENCOUNTER — Other Ambulatory Visit (INDEPENDENT_AMBULATORY_CARE_PROVIDER_SITE_OTHER): Payer: Medicare Other

## 2014-08-30 VITALS — BP 138/80 | HR 63 | Temp 98.2°F | Wt 147.0 lb

## 2014-08-30 DIAGNOSIS — I10 Essential (primary) hypertension: Secondary | ICD-10-CM

## 2014-08-30 DIAGNOSIS — M5440 Lumbago with sciatica, unspecified side: Secondary | ICD-10-CM | POA: Diagnosis not present

## 2014-08-30 DIAGNOSIS — I714 Abdominal aortic aneurysm, without rupture, unspecified: Secondary | ICD-10-CM

## 2014-08-30 DIAGNOSIS — K4090 Unilateral inguinal hernia, without obstruction or gangrene, not specified as recurrent: Secondary | ICD-10-CM | POA: Diagnosis not present

## 2014-08-30 DIAGNOSIS — F17200 Nicotine dependence, unspecified, uncomplicated: Secondary | ICD-10-CM

## 2014-08-30 DIAGNOSIS — Z23 Encounter for immunization: Secondary | ICD-10-CM | POA: Diagnosis not present

## 2014-08-30 DIAGNOSIS — Z720 Tobacco use: Secondary | ICD-10-CM

## 2014-08-30 LAB — BASIC METABOLIC PANEL
BUN: 17 mg/dL (ref 6–23)
CALCIUM: 9.5 mg/dL (ref 8.4–10.5)
CO2: 29 mEq/L (ref 19–32)
Chloride: 106 mEq/L (ref 96–112)
Creatinine, Ser: 0.84 mg/dL (ref 0.40–1.20)
GFR: 69.53 mL/min (ref >60.00)
GLUCOSE: 107 mg/dL — AB (ref 70–99)
Potassium: 4.3 mEq/L (ref 3.5–5.1)
Sodium: 138 mEq/L (ref 135–145)

## 2014-08-30 MED ORDER — TIZANIDINE HCL 4 MG PO TABS: 4.0000 mg | ORAL_TABLET | Freq: Three times a day (TID) | ORAL | Status: DC | PRN

## 2014-08-30 NOTE — Assessment & Plan Note (Signed)
Tizanidine prn

## 2014-08-30 NOTE — Assessment & Plan Note (Signed)
BP is controlled

## 2014-08-30 NOTE — Assessment & Plan Note (Signed)
L groin Discussed Obsevation

## 2014-08-30 NOTE — Progress Notes (Signed)
   Subjective:    HPI  She is s/p hysterectomy for ovarian tumor 04/26/14 Dr Aldean Ast C/o L hernia since Feb 2016 F/u LBP - chronic F/u gout in B feet, OA, AS, AAA, CAD She is a smoker  HR rechecked 60 bpm, BP 138/80  BP Readings from Last 3 Encounters:  08/30/14 138/80  08/12/14 191/67  06/02/14 150/80   Wt Readings from Last 3 Encounters:  08/30/14 147 lb (66.679 kg)  08/12/14 144 lb (65.318 kg)  06/02/14 142 lb (64.411 kg)      Review of Systems  Constitutional: Negative for chills, activity change, appetite change, fatigue and unexpected weight change.  HENT: Negative for congestion, mouth sores and sinus pressure.   Eyes: Negative for visual disturbance.  Respiratory: Negative for cough and chest tightness.   Gastrointestinal: Negative for nausea and abdominal pain.  Genitourinary: Positive for vaginal bleeding. Negative for frequency, difficulty urinating and vaginal pain.  Musculoskeletal: Negative for back pain and gait problem.  Skin: Negative for pallor and rash.  Neurological: Negative for dizziness, tremors, weakness, numbness and headaches.  Psychiatric/Behavioral: Negative for confusion and sleep disturbance. The patient is not nervous/anxious.        Objective:   Physical Exam  Constitutional: She appears well-developed. No distress.  HENT:  Head: Normocephalic.  Right Ear: External ear normal.  Left Ear: External ear normal.  Nose: Nose normal.  Mouth/Throat: Oropharynx is clear and moist.  Eyes: Conjunctivae are normal. Pupils are equal, round, and reactive to light. Right eye exhibits no discharge. Left eye exhibits no discharge.  Neck: Normal range of motion. Neck supple. No JVD present. No tracheal deviation present. No thyromegaly present.  Cardiovascular: Normal rate, regular rhythm and normal heart sounds.   Pulmonary/Chest: No stridor. No respiratory distress. She has no wheezes.  Abdominal: Soft. Bowel sounds are normal. She exhibits  no distension and no mass. There is no tenderness. There is no rebound and no guarding.  Musculoskeletal: She exhibits tenderness. She exhibits no edema.  Lymphadenopathy:    She has no cervical adenopathy.  Neurological: She displays normal reflexes. No cranial nerve deficit. She exhibits normal muscle tone. Coordination normal.  Skin: No rash noted. No erythema.  Psychiatric: She has a normal mood and affect. Her behavior is normal. Judgment and thought content normal.  LS is tender w/ROM Abd scar is healed L groin hernia  Lab Results  Component Value Date   WBC 8.5 04/27/2014   HGB 12.8 04/27/2014   HCT 39.0 04/27/2014   PLT 208 04/27/2014   GLUCOSE 114* 04/27/2014   CHOL 148 01/17/2014   TRIG 116.0 01/17/2014   HDL 38.10* 01/17/2014   LDLDIRECT 76.7 11/17/2008   LDLCALC 87 01/17/2014   ALT 15 04/20/2014   AST 20 04/20/2014   NA 137 04/27/2014   K 4.0 04/27/2014   CL 101 04/27/2014   CREATININE 1.07 04/27/2014   BUN 11 04/27/2014   CO2 27 04/27/2014   TSH 4.57* 01/17/2014          Assessment & Plan:

## 2014-08-30 NOTE — Assessment & Plan Note (Signed)
Discussed.

## 2014-08-30 NOTE — Assessment & Plan Note (Signed)
Medications BB, CCB, ACE-I, diuretic

## 2014-08-30 NOTE — Progress Notes (Signed)
Pre visit review using our clinic review tool, if applicable. No additional management support is needed unless otherwise documented below in the visit note. 

## 2014-09-06 NOTE — Addendum Note (Signed)
Addended by: Cresenciano Lick on: 09/06/2014 03:04 PM   Modules accepted: Orders

## 2014-09-19 ENCOUNTER — Encounter: Payer: Self-pay | Admitting: Vascular Surgery

## 2014-09-20 ENCOUNTER — Encounter: Payer: Self-pay | Admitting: Family

## 2014-09-20 ENCOUNTER — Ambulatory Visit (INDEPENDENT_AMBULATORY_CARE_PROVIDER_SITE_OTHER): Payer: Medicare Other | Admitting: Family

## 2014-09-20 ENCOUNTER — Ambulatory Visit (HOSPITAL_COMMUNITY)
Admission: RE | Admit: 2014-09-20 | Discharge: 2014-09-20 | Disposition: A | Discharge status: Home / Self Care | Payer: Medicare Other | Source: Ambulatory Visit | Attending: Family | Admitting: Family

## 2014-09-20 VITALS — BP 144/63 | HR 53 | Resp 16 | Ht 62.5 in | Wt 146.0 lb

## 2014-09-20 DIAGNOSIS — Z8249 Family history of ischemic heart disease and other diseases of the circulatory system: Secondary | ICD-10-CM | POA: Diagnosis not present

## 2014-09-20 DIAGNOSIS — Z720 Tobacco use: Secondary | ICD-10-CM | POA: Diagnosis not present

## 2014-09-20 DIAGNOSIS — I714 Abdominal aortic aneurysm, without rupture, unspecified: Secondary | ICD-10-CM

## 2014-09-20 DIAGNOSIS — F17200 Nicotine dependence, unspecified, uncomplicated: Secondary | ICD-10-CM

## 2014-09-20 NOTE — Patient Instructions (Addendum)
Abdominal Aortic Aneurysm An aneurysm is a weakened or damaged part of an artery wall that bulges from the normal force of blood pumping through the body. An abdominal aortic aneurysm is an aneurysm that occurs in the lower part of the aorta, the main artery of the body.  The major concern with an abdominal aortic aneurysm is that it can enlarge and burst (rupture) or blood can flow between the layers of the wall of the aorta through a tear (aorticdissection). Both of these conditions can cause bleeding inside the body and can be life threatening unless diagnosed and treated promptly. CAUSES  The exact cause of an abdominal aortic aneurysm is unknown. Some contributing factors are:   A hardening of the arteries caused by the buildup of fat and other substances in the lining of a blood vessel (arteriosclerosis).  Inflammation of the walls of an artery (arteritis).   Connective tissue diseases, such as Marfan syndrome.   Abdominal trauma.   An infection, such as syphilis or staphylococcus, in the wall of the aorta (infectious aortitis) caused by bacteria. RISK FACTORS  Risk factors that contribute to an abdominal aortic aneurysm may include:  Age older than 60 years.   High blood pressure (hypertension).  Female gender.  Ethnicity (white race).  Obesity.  Family history of aneurysm (first degree relatives only).  Tobacco use. PREVENTION  The following healthy lifestyle habits may help decrease your risk of abdominal aortic aneurysm:  Quitting smoking. Smoking can raise your blood pressure and cause arteriosclerosis.  Limiting or avoiding alcohol.  Keeping your blood pressure, blood sugar level, and cholesterol levels within normal limits.  Decreasing your salt intake. In somepeople, too much salt can raise blood pressure and increase your risk of abdominal aortic aneurysm.  Eating a diet low in saturated fats and cholesterol.  Increasing your fiber intake by including  whole grains, vegetables, and fruits in your diet. Eating these foods may help lower blood pressure.  Maintaining a healthy weight.  Staying physically active and exercising regularly. SYMPTOMS  The symptoms of abdominal aortic aneurysm may vary depending on the size and rate of growth of the aneurysm.Most grow slowly and do not have any symptoms. When symptoms do occur, they may include:  Pain (abdomen, side, lower back, or groin). The pain may vary in intensity. A sudden onset of severe pain may indicate that the aneurysm has ruptured.  Feeling full after eating only small amounts of food.  Nausea or vomiting or both.  Feeling a pulsating lump in the abdomen.  Feeling faint or passing out. DIAGNOSIS  Since most unruptured abdominal aortic aneurysms have no symptoms, they are often discovered during diagnostic exams for other conditions. An aneurysm may be found during the following procedures:  Ultrasonography (A one-time screening for abdominal aortic aneurysm by ultrasonography is also recommended for all men aged 65-75 years who have ever smoked).  X-ray exams.  A computed tomography (CT).  Magnetic resonance imaging (MRI).  Angiography or arteriography. TREATMENT  Treatment of an abdominal aortic aneurysm depends on the size of your aneurysm, your age, and risk factors for rupture. Medication to control blood pressure and pain may be used to manage aneurysms smaller than 6 cm. Regular monitoring for enlargement may be recommended by your caregiver if:  The aneurysm is 3-4 cm in size (an annual ultrasonography may be recommended).  The aneurysm is 4-4.5 cm in size (an ultrasonography every 6 months may be recommended).  The aneurysm is larger than 4.5 cm in   size (your caregiver may ask that you be examined by a vascular surgeon). If your aneurysm is larger than 6 cm, surgical repair may be recommended. There are two main methods for repair of an aneurysm:   Endovascular  repair (a minimally invasive surgery). This is done most often.  Open repair. This method is used if an endovascular repair is not possible. Document Released: 02/27/2005 Document Revised: 09/14/2012 Document Reviewed: 06/19/2012 [employer] Patient Information 2015 [employer]. This information is not intended to replace advice given to you by your health care provider. Make sure you discuss any questions you have with your health care provider.   Smoking Cessation Quitting smoking is important to your health and has many advantages. However, it is not always easy to quit since nicotine is a very addictive drug. Oftentimes, people try 3 times or more before being able to quit. This document explains the best ways for you to prepare to quit smoking. Quitting takes hard work and a lot of effort, but you can do it. ADVANTAGES OF QUITTING SMOKING  You will live longer, feel better, and live better.  Your body will feel the impact of quitting smoking almost immediately.  Within 20 minutes, blood pressure decreases. Your pulse returns to its normal level.  After 8 hours, carbon monoxide levels in the blood return to normal. Your oxygen level increases.  After 24 hours, the chance of having a heart attack starts to decrease. Your breath, hair, and body stop smelling like smoke.  After 48 hours, damaged nerve endings begin to recover. Your sense of taste and smell improve.  After 72 hours, the body is virtually free of nicotine. Your bronchial tubes relax and breathing becomes easier.  After 2 to 12 weeks, lungs can hold more air. Exercise becomes easier and circulation improves.  The risk of having a heart attack, stroke, cancer, or lung disease is greatly reduced.  After 1 year, the risk of coronary heart disease is cut in half.  After 5 years, the risk of stroke falls to the same as a nonsmoker.  After 10 years, the risk of lung cancer is cut in half and the risk of other cancers  decreases significantly.  After 15 years, the risk of coronary heart disease drops, usually to the level of a nonsmoker.  If you are pregnant, quitting smoking will improve your chances of having a healthy baby.  The people you live with, especially any children, will be healthier.  You will have extra money to spend on things other than cigarettes. QUESTIONS TO THINK ABOUT BEFORE ATTEMPTING TO QUIT You may want to talk about your answers with your health care provider.  Why do you want to quit?  If you tried to quit in the past, what helped and what did not?  What will be the most difficult situations for you after you quit? How will you plan to handle them?  Who can help you through the tough times? Your family? Friends? A health care provider?  What pleasures do you get from smoking? What ways can you still get pleasure if you quit? Here are some questions to ask your health care provider:  How can you help me to be successful at quitting?  What medicine do you think would be best for me and how should I take it?  What should I do if I need more help?  What is smoking withdrawal like? How can I get information on withdrawal? GET READY  Set a quit   date.  Change your environment by getting rid of all cigarettes, ashtrays, matches, and lighters in your home, car, or work. Do not let people smoke in your home.  Review your past attempts to quit. Think about what worked and what did not. GET SUPPORT AND ENCOURAGEMENT You have a better chance of being successful if you have help. You can get support in many ways.  Tell your family, friends, and coworkers that you are going to quit and need their support. Ask them not to smoke around you.  Get individual, group, or telephone counseling and support. Programs are available at local hospitals and health centers. Call your local health department for information about programs in your area.  Spiritual beliefs and practices may  help some smokers quit.  Download a "quit meter" on your computer to keep track of quit statistics, such as how long you have gone without smoking, cigarettes not smoked, and money saved.  Get a self-help book about quitting smoking and staying off tobacco. LEARN NEW SKILLS AND BEHAVIORS  Distract yourself from urges to smoke. Talk to someone, go for a walk, or occupy your time with a task.  Change your normal routine. Take a different route to work. Drink tea instead of coffee. Eat breakfast in a different place.  Reduce your stress. Take a hot bath, exercise, or read a book.  Plan something enjoyable to do every day. Reward yourself for not smoking.  Explore interactive web-based programs that specialize in helping you quit. GET MEDICINE AND USE IT CORRECTLY Medicines can help you stop smoking and decrease the urge to smoke. Combining medicine with the above behavioral methods and support can greatly increase your chances of successfully quitting smoking.  Nicotine replacement therapy helps deliver nicotine to your body without the negative effects and risks of smoking. Nicotine replacement therapy includes nicotine gum, lozenges, inhalers, nasal sprays, and skin patches. Some may be available over-the-counter and others require a prescription.  Antidepressant medicine helps people abstain from smoking, but how this works is unknown. This medicine is available by prescription.  Nicotinic receptor partial agonist medicine simulates the effect of nicotine in your brain. This medicine is available by prescription. Ask your health care provider for advice about which medicines to use and how to use them based on your health history. Your health care provider will tell you what side effects to look out for if you choose to be on a medicine or therapy. Carefully read the information on the package. Do not use any other product containing nicotine while using a nicotine replacement product.    RELAPSE OR DIFFICULT SITUATIONS Most relapses occur within the first 3 months after quitting. Do not be discouraged if you start smoking again. Remember, most people try several times before finally quitting. You may have symptoms of withdrawal because your body is used to nicotine. You may crave cigarettes, be irritable, feel very hungry, cough often, get headaches, or have difficulty concentrating. The withdrawal symptoms are only temporary. They are strongest when you first quit, but they will go away within 10-14 days. To reduce the chances of relapse, try to:  Avoid drinking alcohol. Drinking lowers your chances of successfully quitting.  Reduce the amount of caffeine you consume. Once you quit smoking, the amount of caffeine in your body increases and can give you symptoms, such as a rapid heartbeat, sweating, and anxiety.  Avoid smokers because they can make you want to smoke.  Do not let weight gain distract you. Many   smokers will gain weight when they quit, usually less than 10 pounds. Eat a healthy diet and stay active. You can always lose the weight gained after you quit.  Find ways to improve your mood other than smoking. FOR MORE INFORMATION  www.smokefree.gov  Document Released: 05/14/2001 Document Revised: 10/04/2013 Document Reviewed: 08/29/2011 [employer] Patient Information 2015 [employer]. This information is not intended to replace advice given to you by your health care provider. Make sure you discuss any questions you have with your health care provider.   Smoking Cessation, Tips for Success If you are ready to quit smoking, congratulations! You have chosen to help yourself be healthier. Cigarettes bring nicotine, tar, carbon monoxide, and other irritants into your body. Your lungs, heart, and blood vessels will be able to work better without these poisons. There are many different ways to quit smoking. Nicotine gum, nicotine patches, a nicotine inhaler, or nicotine  nasal spray can help with physical craving. Hypnosis, support groups, and medicines help break the habit of smoking. WHAT THINGS CAN I DO TO MAKE QUITTING EASIER?  Here are some tips to help you quit for good:  Pick a date when you will quit smoking completely. Tell all of your friends and family about your plan to quit on that date.  Do not try to slowly cut down on the number of cigarettes you are smoking. Pick a quit date and quit smoking completely starting on that day.  Throw away all cigarettes.   Clean and remove all ashtrays from your home, work, and car.  On a card, write down your reasons for quitting. Carry the card with you and read it when you get the urge to smoke.  Cleanse your body of nicotine. Drink enough water and fluids to keep your urine clear or pale yellow. Do this after quitting to flush the nicotine from your body.  Learn to predict your moods. Do not let a bad situation be your excuse to have a cigarette. Some situations in your life might tempt you into wanting a cigarette.  Never have "just one" cigarette. It leads to wanting another and another. Remind yourself of your decision to quit.  Change habits associated with smoking. If you smoked while driving or when feeling stressed, try other activities to replace smoking. Stand up when drinking your coffee. Brush your teeth after eating. Sit in a different chair when you read the paper. Avoid alcohol while trying to quit, and try to drink fewer caffeinated beverages. Alcohol and caffeine may urge you to smoke.  Avoid foods and drinks that can trigger a desire to smoke, such as sugary or spicy foods and alcohol.  Ask people who smoke not to smoke around you.  Have something planned to do right after eating or having a cup of coffee. For example, plan to take a walk or exercise.  Try a relaxation exercise to calm you down and decrease your stress. Remember, you may be tense and nervous for the first 2 weeks after  you quit, but this will pass.  Find new activities to keep your hands busy. Play with a pen, coin, or rubber band. Doodle or draw things on paper.  Brush your teeth right after eating. This will help cut down on the craving for the taste of tobacco after meals. You can also try mouthwash.   Use oral substitutes in place of cigarettes. Try using lemon drops, carrots, cinnamon sticks, or chewing gum. Keep them handy so they are available when you   have the urge to smoke.  When you have the urge to smoke, try deep breathing.  Designate your home as a nonsmoking area.  If you are a heavy smoker, ask your health care provider about a prescription for nicotine chewing gum. It can ease your withdrawal from nicotine.  Reward yourself. Set aside the cigarette money you save and buy yourself something nice.  Look for support from others. Join a support group or smoking cessation program. Ask someone at home or at work to help you with your plan to quit smoking.  Always ask yourself, "Do I need this cigarette or is this just a reflex?" Tell yourself, "Today, I choose not to smoke," or "I do not want to smoke." You are reminding yourself of your decision to quit.  Do not replace cigarette smoking with electronic cigarettes (commonly called e-cigarettes). The safety of e-cigarettes is unknown, and some may contain harmful chemicals.  If you relapse, do not give up! Plan ahead and think about what you will do the next time you get the urge to smoke. HOW WILL I FEEL WHEN I QUIT SMOKING? You may have symptoms of withdrawal because your body is used to nicotine (the addictive substance in cigarettes). You may crave cigarettes, be irritable, feel very hungry, cough often, get headaches, or have difficulty concentrating. The withdrawal symptoms are only temporary. They are strongest when you first quit but will go away within 10-14 days. When withdrawal symptoms occur, stay in control. Think about your reasons  for quitting. Remind yourself that these are signs that your body is healing and getting used to being without cigarettes. Remember that withdrawal symptoms are easier to treat than the major diseases that smoking can cause.  Even after the withdrawal is over, expect periodic urges to smoke. However, these cravings are generally short lived and will go away whether you smoke or not. Do not smoke! WHAT RESOURCES ARE AVAILABLE TO HELP ME QUIT SMOKING? Your health care provider can direct you to community resources or hospitals for support, which may include:  Group support.  Education.  Hypnosis.  Therapy. Document Released: 02/16/2004 Document Revised: 10/04/2013 Document Reviewed: 11/05/2012 [employer] Patient Information 2015 [employer]. This information is not intended to replace advice given to you by your health care provider. Make sure you discuss any questions you have with your health care provider.   

## 2014-09-20 NOTE — Progress Notes (Signed)
Filed Vitals:   09/20/14 0932 09/20/14 0949  BP: 145/65 144/63  Pulse: 50 53  Resp: 16   Height: 5' 2.5" (1.588 m)   Weight: 146 lb (66.225 kg)   SpO2: 98%

## 2014-09-20 NOTE — Progress Notes (Signed)
[facility] [place]  Established Abdominal Aortic Aneurysm  History of Present Illness  Tammy Brooks is a 81 y.o. (12-03-1933) female patient of Dr. Donnetta Hutching who returns today for surveillance and discussion of AAA. At her October 2015 visit with Dr. 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 There is also a new finding of a 9 cm pelvic mass. Radiologist interpretation is concerning for ovarian malignancy. Dr. Donnetta Hutching discussed this with her internist Dr Alain Marion, who as to coordinate referral to a gyn specialist. She had an abdominal hysterectomy, BSO, in November 2015 for a pelvic mass, pt states this was not cancer. She does have a known lower abdominal hernia since this surgery and this is being followed by one of her providers, this is mildly painful. Pt states she has inflammation in her low back and both hips has pain in these areas, denies radiculopathy type pain. Pt states her mother and maternal aunt had a AAA.  The patient denies claudication in legs with walking. The patient reports history of stroke in 2000 as manifested by syncope, from this event denies any residual neurological deficit. Pt states Dr. Gwenlyn Found regularly checks her carotid arteries by ultrasound. She is blind in her left eye which she attributes as related to cataract surgery.  Pt Diabetic: No Pt smoker: smoker  (1 ppd, started at age 16 yrs)  Past Medical History  Diagnosis Date  . Arthritis   . Blind left eye   . Heart murmur   . Hyperlipidemia   . Hypertension   . Myocardial infarction     mild/ age 69  . Ulcer   . CAD (coronary artery disease) 08/1995    50-55% second diag dz.; occluded OM  . PAD (peripheral artery disease)     mild to mod Rt ICA stenosis followed by doppler-02/05/12  . Echocardiogram abnormal 03/2012    mild to mod AS, valve area1.58 cm2, mild concentric LVH, grade I diastolic dusfunction  . Tobacco abuse   .  Abdominal aortic aneurysm     followed by Dr. Sherren Mocha Early  . Complication of anesthesia     "Blood pressure went down" with retinal surgery - no problems with previous surgeries  . Stroke 2001    no residual problems  . Gout   . Pelvic mass    Past Surgical History  Procedure Laterality Date  . Cataract extraction w/ intraocular lens  implant, bilateral    . Eye surgery      lense implant removed/ blind in left eye  . Upper gastrointestinal endoscopy      treated for h-pylori  . Colonoscopy    . Polypectomy    . Tubal ligation    . Appendectomy    . Tonsillectomy and adenoidectomy    . Abdominal hysterectomy    . Cardiac catheterization  08/1995    50-55% second diag lesion, occluded OM  . Retinal detachment surgery  2006  . Laparotomy N/A 04/26/2014    Procedure: EXPLORATORY LAPAROTOMY/RESECTION OF PELVIC MASS;  Surgeon: Alvino Chapel, MD;  Location: [facility];  Service: Gynecology;  Laterality: N/A;   Social History History   Social History  . Marital Status: Widowed    Spouse Name: N/A  . Number of Children: 5  . Years of Education: 9   Occupational History  . Oncologist     retired   Social History Main Topics  . Smoking status: Current Every  Day Smoker -- 1.00 packs/day for 62 years    Types: Cigarettes  . Smokeless tobacco: Never Used  . Alcohol Use: 0.0 oz/week     Comment: occasionally - few drinks/month  . Drug Use: No  . Sexual Activity: Not Currently   Other Topics Concern  . Not on file   Social History Narrative   9th grade. married '54- widowed Dec 10th'08. 4 sons - '55, '58, '60, '69; 1 daughter - '63; 12 grandchildren; 9 great-grandchildren. 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 Other children are close and attentive. ACP/End-of-life: No CPR, no mechanical ventilation, no futile/heroic measures.   Family History Family History  Problem Relation Age of Onset  . Aneurysm Mother     AAA   . Hypertension Mother   . Heart disease Father     CAD, MI x 3  . Heart attack Father   . Cancer Maternal Aunt     breast cancer    Current Outpatient Prescriptions on File Prior to Visit  Medication Sig Dispense Refill  . allopurinol (ZYLOPRIM) 100 MG tablet Take 1 tablet (100 mg total) by mouth daily. 90 tablet 3  . amLODipine (NORVASC) 5 MG tablet Take 1 tablet (5 mg total) by mouth daily. 90 tablet 2  . aspirin EC 81 MG tablet Take 81 mg by mouth daily.    . cholecalciferol (VITAMIN D) 1000 UNITS tablet Take 1 tablet (1,000 Units total) by mouth daily. 100 tablet 3  . colchicine 0.6 MG tablet Take 1 tablet (0.6 mg total) by mouth 4 (four) times daily as needed. 360 tablet 2  . dorzolamide-timolol (COSOPT) 22.3-6.8 MG/ML ophthalmic solution Place 1 drop into the left eye daily. 10 mL 3  . estradiol (ESTRACE) 0.5 MG tablet Take 1 tablet (0.5 mg total) by mouth daily. 90 tablet 2  . lisinopril (PRINIVIL,ZESTRIL) 20 MG tablet Take 1 tablet (20 mg total) by mouth 2 (two) times daily. 180 tablet 2  . meloxicam (MOBIC) 15 MG tablet Take 1 tablet (15 mg total) by mouth daily as needed for pain. 90 tablet 1  . metoprolol (LOPRESSOR) 50 MG tablet Take 0.5 tablets (25 mg total) by mouth 2 (two) times daily. 180 tablet 2  . simvastatin (ZOCOR) 40 MG tablet Take 0.5 tablets (20 mg total) by mouth at bedtime. 90 tablet 3  . tiZANidine (ZANAFLEX) 4 MG tablet Take 1 tablet (4 mg total) by mouth every 8 (eight) hours as needed for muscle spasms. 90 tablet 2   No current facility-administered medications on file prior to visit.   Allergies  Allergen Reactions  . Nabumetone Other (See Comments)    Causes inflammation    ROS: See HPI for pertinent positives and negatives.  Physical Examination  Filed Vitals:   09/20/14 0932 09/20/14 0949  BP: 145/65 144/63  Pulse: 50 53  Resp: 16   Height: 5' 2.5" (1.588 m)   Weight: 146 lb (66.225 kg)   SpO2: 98%    Body mass index is 26.26  kg/(m^2).  General: A&O x 3, WD.  Pulmonary: Sym exp, fair air movt, CTAB, no rales, rhonchi, transient wheezes.  Cardiac: RRR, Nl S1, S2, + murmur.   Carotid Bruits Right Left   transmitted cardiac murmur Transmitted cardiac murmur   Aorta is not palpable Radial pulses are 2+ palpable and =  VASCULAR EXAM:                                                                                                         LE Pulses Right Left       FEMORAL   palpable   palpable        POPLITEAL  not palpable   not palpable       POSTERIOR TIBIAL  not palpable   not palpable        DORSALIS PEDIS      ANTERIOR TIBIAL faintly palpable  1+ palpable      Gastrointestinal: soft, NTND, -G/R, - HSM, - masses palpated, - CVAT B.  Musculoskeletal: M/S 5/5 throughout, Extremities without ischemic changes.  Neurologic: CN 2-12 intact, Pain and light touch intact in extremities are intact except, Motor exam as listed above.  Non-Invasive Vascular Imaging  AAA Duplex (09/20/2014) ABDOMINAL AORTA DUPLEX EVALUATION    INDICATION: Abdominal Aortic Aneurysm    PREVIOUS INTERVENTION(S): NA    DUPLEX EXAM:     LOCATION DIAMETER AP (cm) DIAMETER TRANSVERSE (cm) VELOCITIES (cm/sec)  Aorta Proximal 2.87 2.79 53  Aorta Mid 2.13 2.68 43  Aorta Distal 4.32 4.38 40  Right Common Iliac Artery 1.54 1.26 196/174  Left Common Iliac Artery 1.50 1.20 122    Previous max aortic diameter:  4.3([facility] CT) Date: 03/07/2014  ADDITIONAL FINDINGS:     IMPRESSION: Abdominal Aortic aneurysm present measuring approximately 4.32cm AP x 4.38cm TRV, with non-occluding intramural thrombus present as well as proximal vessel tortuousity/angulation present.    Compared to the previous exam:  No previous ultrasound to compare.     Medical Decision Making  The patient is a 81 y.o. female who presents with asymptomatic AAA with no increase size, largest diameter today measures  4.38 cm.  Unfortunately she continues to smoke. The patient was counseled re smoking cessation and given several free resources re smoking cessation.    Based on this patient's exam and diagnostic studies, the patient will follow up in 6 months  with the following studies: AAA Duplex.  Consideration for repair of AAA would be made when the size approaches 5.0 cm, growth > 1 cm/yr, and symptomatic status.  I emphasized the importance of maximal medical management including strict control of blood pressure, blood glucose, and lipid levels, antiplatelet agents, obtaining regular exercise, and cessation of smoking.   The patient was given information about AAA including signs, symptoms, treatment, and how to minimize the risk of enlargement and rupture of aneurysms.    The patient was advised to call 911 should the patient experience sudden onset abdominal or back pain.   Thank you for allowing [place] to participate in this patient's care.  Clemon Chambers, RN, MSN, FNP-C [facility] [place] Office: [phone]  Clinic Physician: Early  09/20/2014, 9:35 AM

## 2014-09-26 NOTE — Addendum Note (Signed)
Addended by: Mena Goes on: 09/26/2014 04:48 PM   Modules accepted: Orders

## 2014-10-06 ENCOUNTER — Encounter: Payer: Self-pay | Admitting: Gastroenterology

## 2014-10-24 DIAGNOSIS — H5211 Myopia, right eye: Secondary | ICD-10-CM | POA: Diagnosis not present

## 2014-10-24 DIAGNOSIS — H44512 Absolute glaucoma, left eye: Secondary | ICD-10-CM | POA: Diagnosis not present

## 2014-10-24 DIAGNOSIS — H548 Legal blindness, as defined in USA: Secondary | ICD-10-CM | POA: Diagnosis not present

## 2014-10-24 DIAGNOSIS — H52201 Unspecified astigmatism, right eye: Secondary | ICD-10-CM | POA: Diagnosis not present

## 2014-10-28 ENCOUNTER — Ambulatory Visit: Payer: Commercial Managed Care - HMO | Admitting: Gynecology

## 2014-11-03 DIAGNOSIS — H44512 Absolute glaucoma, left eye: Secondary | ICD-10-CM | POA: Diagnosis not present

## 2014-11-03 DIAGNOSIS — H338 Other retinal detachments: Secondary | ICD-10-CM | POA: Diagnosis not present

## 2014-11-03 DIAGNOSIS — H2702 Aphakia, left eye: Secondary | ICD-10-CM | POA: Diagnosis not present

## 2014-11-03 DIAGNOSIS — Z961 Presence of intraocular lens: Secondary | ICD-10-CM | POA: Diagnosis not present

## 2014-11-16 ENCOUNTER — Other Ambulatory Visit: Payer: Self-pay

## 2014-11-16 MED ORDER — TIZANIDINE HCL 4 MG PO TABS: 4.0000 mg | ORAL_TABLET | Freq: Three times a day (TID) | ORAL | Status: DC | PRN

## 2014-11-25 ENCOUNTER — Ambulatory Visit: Payer: Commercial Managed Care - HMO | Admitting: Gynecology

## 2014-11-30 ENCOUNTER — Ambulatory Visit (INDEPENDENT_AMBULATORY_CARE_PROVIDER_SITE_OTHER): Payer: Medicare Other | Admitting: Internal Medicine

## 2014-11-30 ENCOUNTER — Encounter: Payer: Self-pay | Admitting: Internal Medicine

## 2014-11-30 VITALS — BP 132/76 | HR 70 | Wt 150.0 lb

## 2014-11-30 DIAGNOSIS — R609 Edema, unspecified: Secondary | ICD-10-CM | POA: Insufficient documentation

## 2014-11-30 DIAGNOSIS — I10 Essential (primary) hypertension: Secondary | ICD-10-CM | POA: Diagnosis not present

## 2014-11-30 DIAGNOSIS — R1900 Intra-abdominal and pelvic swelling, mass and lump, unspecified site: Secondary | ICD-10-CM | POA: Diagnosis not present

## 2014-11-30 MED ORDER — FUROSEMIDE 20 MG PO TABS: 20.0000 mg | ORAL_TABLET | Freq: Every day | ORAL | Status: DC | PRN

## 2014-11-30 NOTE — Progress Notes (Signed)
Pre visit review using our clinic review tool, if applicable. No additional management support is needed unless otherwise documented below in the visit note. 

## 2014-11-30 NOTE — Assessment & Plan Note (Signed)
Amlodipine, lisinopril, metoprolol

## 2014-11-30 NOTE — Patient Instructions (Addendum)
Compression knee highs for travel Wean off Estrace

## 2014-11-30 NOTE — Progress Notes (Signed)
   Subjective:    HPI  C/o leg swelling after a trip to a beach: up to the knees, tght - better now. The pt came back on 6.16.  She is s/p hysterectomy for ovarian tumor 04/26/14 Dr Aldean Ast F/u L hernia since Feb 2016 F/u LBP - chronic F/u gout in B feet, OA, AS, AAA, CAD She is a smoker  HR rechecked 60 bpm, BP 138/80  BP Readings from Last 3 Encounters:  11/30/14 132/76  09/20/14 144/63  08/30/14 138/80   Wt Readings from Last 3 Encounters:  11/30/14 150 lb (68.04 kg)  09/20/14 146 lb (66.225 kg)  08/30/14 147 lb (66.679 kg)      Review of Systems  Constitutional: Negative for chills, activity change, appetite change, fatigue and unexpected weight change.  HENT: Negative for congestion, mouth sores and sinus pressure.   Eyes: Negative for visual disturbance.  Respiratory: Negative for cough and chest tightness.   Gastrointestinal: Negative for nausea and abdominal pain.  Genitourinary: Positive for vaginal bleeding. Negative for frequency, difficulty urinating and vaginal pain.  Musculoskeletal: Negative for back pain and gait problem.  Skin: Negative for pallor and rash.  Neurological: Negative for dizziness, tremors, weakness, numbness and headaches.  Psychiatric/Behavioral: Negative for confusion and sleep disturbance. The patient is not nervous/anxious.        Objective:   Physical Exam  Constitutional: She appears well-developed. No distress.  HENT:  Head: Normocephalic.  Right Ear: External ear normal.  Left Ear: External ear normal.  Nose: Nose normal.  Mouth/Throat: Oropharynx is clear and moist.  Eyes: Conjunctivae are normal. Pupils are equal, round, and reactive to light. Right eye exhibits no discharge. Left eye exhibits no discharge.  Neck: Normal range of motion. Neck supple. No JVD present. No tracheal deviation present. No thyromegaly present.  Cardiovascular: Normal rate, regular rhythm and normal heart sounds.   Pulmonary/Chest: No  stridor. No respiratory distress. She has no wheezes.  Abdominal: Soft. Bowel sounds are normal. She exhibits no distension and no mass. There is no tenderness. There is no rebound and no guarding.  Musculoskeletal: She exhibits tenderness. She exhibits no edema.  Lymphadenopathy:    She has no cervical adenopathy.  Neurological: She displays normal reflexes. No cranial nerve deficit. She exhibits normal muscle tone. Coordination normal.  Skin: No rash noted. No erythema.  Psychiatric: She has a normal mood and affect. Her behavior is normal. Judgment and thought content normal.  LS is tender w/ROM Abd scar is healed L groin hernia B LE no edema, calves NT B  Lab Results  Component Value Date   WBC 8.5 04/27/2014   HGB 12.8 04/27/2014   HCT 39.0 04/27/2014   PLT 208 04/27/2014   GLUCOSE 107* 08/30/2014   CHOL 148 01/17/2014   TRIG 116.0 01/17/2014   HDL 38.10* 01/17/2014   LDLDIRECT 76.7 11/17/2008   LDLCALC 87 01/17/2014   ALT 15 04/20/2014   AST 20 04/20/2014   NA 138 08/30/2014   K 4.3 08/30/2014   CL 106 08/30/2014   CREATININE 0.84 08/30/2014   BUN 17 08/30/2014   CO2 29 08/30/2014   TSH 4.57* 01/17/2014          Assessment & Plan:

## 2014-11-30 NOTE — Assessment & Plan Note (Addendum)
6/16 B LEs. Swelling resolved in 2 d B LE no edema, calves NT B Furosemide prn

## 2014-11-30 NOTE — Assessment & Plan Note (Signed)
She is s/p hysterectomy for ovarian tumor 04/26/14 Dr Aldean Ast - not malignant

## 2015-01-12 ENCOUNTER — Other Ambulatory Visit: Payer: Self-pay | Admitting: Internal Medicine

## 2015-01-13 ENCOUNTER — Other Ambulatory Visit: Payer: Self-pay | Admitting: *Deleted

## 2015-01-13 MED ORDER — SIMVASTATIN 40 MG PO TABS: 40.0000 mg | ORAL_TABLET | Freq: Every day | ORAL | Status: DC

## 2015-01-20 ENCOUNTER — Telehealth: Payer: Self-pay | Admitting: *Deleted

## 2015-01-20 MED ORDER — ATORVASTATIN CALCIUM 20 MG PO TABS: 20.0000 mg | ORAL_TABLET | Freq: Every day | ORAL | Status: DC

## 2015-01-20 NOTE — Telephone Encounter (Signed)
Rec fax requesting to change Simvastatin 40 mg qd due to patient taking Amlodipine 5 mg. Per MD change to Lipitor 20 mg qd # 90 with 4 refills.  See meds.

## 2015-03-17 ENCOUNTER — Ambulatory Visit: Payer: Medicare Other

## 2015-03-17 ENCOUNTER — Encounter: Payer: Self-pay | Admitting: Gynecology

## 2015-03-17 ENCOUNTER — Ambulatory Visit: Payer: Medicare Other | Attending: Gynecology | Admitting: Gynecology

## 2015-03-17 VITALS — BP 156/95 | HR 65 | Temp 97.9°F | Resp 18 | Ht 62.5 in | Wt 149.6 lb

## 2015-03-17 DIAGNOSIS — K432 Incisional hernia without obstruction or gangrene: Secondary | ICD-10-CM

## 2015-03-17 DIAGNOSIS — D3911 Neoplasm of uncertain behavior of right ovary: Secondary | ICD-10-CM

## 2015-03-17 DIAGNOSIS — D3910 Neoplasm of uncertain behavior of unspecified ovary: Secondary | ICD-10-CM | POA: Diagnosis not present

## 2015-03-17 NOTE — Progress Notes (Signed)
Consult Note: Gyn-Onc   Tammy Brooks 81 y.o. female  Chief Complaint  Patient presents with  . 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 Ventral hernia at the lower pole of her incision.  Plan: Inhibin B and AMH are obtained today.. She return to see me in 6 months and we will repeat tumor markers at each visit.  Patient seen vascular surgery next week regarding her aortic aneurysm. We will ask that a referral be made to general surgery regarding management and repair of her ventral hernia.  Interval history: Patient returns today as previously scheduled. The only problem she is identified is that when she is putting on her pants the hernia is bulging more than previously. She denies any pain or other symptoms associated with the hernia. Overall her functional status is good.. She denies any GI or GU symptoms and has no pelvic pain or pressure. HPI:  81 year old white female seen in consultation at the request of Dr. Aloha Gell for evaluation of a newly diagnosed complex right pelvic mass.  The patient underwent a CT scan to assess an abdominal aortic aneurysm on 03/07/2014. An incidental finding was made of a 9 cm solid and cystic right adnexal mass. CT did not show any evidence of adenopathy, ascites, or any other evidence of metastatic disease. CA-125 value is 8.1 units per mL. The patient has no past gynecologic history except for undergoing a vaginal hysterectomy and previous tubal ligation. She denies any family history of ovarian cancer.  Patient denies any pelvic pain pressure or any other GI or GU symptoms.  She does have significant medical comorbidities especially vascular. The aortic aneurysm measures 4.1 x 4.3 cm with extensive mural thrombus throughout the aneurysm.  Patient underwent exploratory laparotomy and bilateral salpingo-oophorectomy on 04/26/2014. She is found to have a 8 cm granulosa  cell tumor of the right ovary. She had an uncomplicated  postoperative course.  Review of Systems:10 point review of systems is negative except as noted in interval history.   Vitals: Blood pressure 156/95, pulse 65, temperature 97.9 F (36.6 C), temperature source Oral, resp. rate 18, height 5' 2.5" (1.588 m), weight 149 lb 9.6 oz (67.858 kg), SpO2 98 %.  Physical Exam: General : The patient is a healthy woman in no acute distress.  HEENT: normocephalic, extraoccular movements normal; neck is supple without thyromegally  Lynphnodes: Supraclavicular and inguinal nodes not enlarged  Abdomen: Soft, non-tender, no ascites, no organomegally, no masses, no hernias, midline incision is well-healed. There is an easily reducible ventral hernia the lower aspect of the midline incision. Pelvic:  EGBUS: Normal female  Vagina: Normal, no lesions  Urethra and Bladder: Normal, non-tender  Cervix: Surgically absent  Uterus: Surgically absent  Bi-manual examination: No masses  Rectal: normal sphincter tone, no masses, no blood  Lower extremities: No edema or varicosities. Normal range of motion      Allergies  Allergen Reactions  . Nabumetone Anaphylaxis and Other (See Comments)    Causes inflammation    Past Medical History  Diagnosis Date  . Arthritis   . Blind left eye   . Heart murmur   . Hyperlipidemia   . Hypertension   . Myocardial infarction [facility])     mild/ age 44  . Ulcer   . CAD (coronary artery disease) 08/1995    50-55% second diag dz.; occluded OM  . PAD (peripheral artery disease) (  [place])     mild to mod Rt ICA stenosis followed by doppler-02/05/12  . Echocardiogram abnormal 03/2012    mild to mod AS, valve area1.58 cm2, mild concentric LVH, grade I diastolic dusfunction  . Tobacco abuse   . Abdominal aortic aneurysm ([place])     followed by Dr. Sherren Mocha Early  . Complication of anesthesia     "Blood pressure went down" with retinal surgery - no problems with previous surgeries   . Stroke [facility]) 2001    no residual problems  . Gout   . Pelvic mass     Past Surgical History  Procedure Laterality Date  . Cataract extraction w/ intraocular lens  implant, bilateral    . Eye surgery      lense implant removed/ blind in left eye  . Upper gastrointestinal endoscopy      treated for h-pylori  . Colonoscopy    . Polypectomy    . Tubal ligation    . Appendectomy    . Tonsillectomy and adenoidectomy    . Abdominal hysterectomy    . Cardiac catheterization  08/1995    50-55% second diag lesion, occluded OM  . Retinal detachment surgery  2006  . Laparotomy N/A 04/26/2014    Procedure: EXPLORATORY LAPAROTOMY/RESECTION OF PELVIC MASS;  Surgeon: Alvino Chapel, MD;  Location: [facility];  Service: Gynecology;  Laterality: N/A;    Current Outpatient Prescriptions  Medication Sig Dispense Refill  . allopurinol (ZYLOPRIM) 100 MG tablet Take 1 tablet (100 mg total) by mouth daily. 90 tablet 3  . amLODipine (NORVASC) 5 MG tablet Take 1 tablet (5 mg total) by mouth daily. 90 tablet 2  . aspirin EC 81 MG tablet Take 81 mg by mouth daily.    . cholecalciferol (VITAMIN D) 1000 UNITS tablet Take 1 tablet (1,000 Units total) by mouth daily. 100 tablet 3  . colchicine 0.6 MG tablet Take 1 tablet (0.6 mg total) by mouth 4 (four) times daily as needed. 360 tablet 2  . dorzolamide-timolol (COSOPT) 22.3-6.8 MG/ML ophthalmic solution Place 1 drop into the left eye daily. (Patient taking differently: Place 1 drop into the left eye daily. 2-0.5% sol 10.0  One drop Left eye daily) 10 mL 3  . estradiol (ESTRACE) 0.5 MG tablet Take 1 tablet (0.5 mg total) by mouth daily. 90 tablet 2  . furosemide (LASIX) 20 MG tablet Take 1-2 tablets (20-40 mg total) by mouth daily as needed for edema. 60 tablet 3  . lisinopril (PRINIVIL,ZESTRIL) 20 MG tablet Take 1 tablet (20 mg total) by mouth 2 (two) times daily. 180 tablet 2  . meloxicam (MOBIC) 15 MG tablet Take 1 tablet by mouth  daily as needed  for pain 90 tablet 2  . metoprolol (LOPRESSOR) 50 MG tablet Take 0.5 tablets (25 mg total) by mouth 2 (two) times daily. 180 tablet 2  . Polyethylene Glycol 3350 POWD as needed.     . simvastatin (ZOCOR) 20 MG tablet Take 20 mg by mouth daily.    Marland Kitchen tiZANidine (ZANAFLEX) 4 MG tablet Take 1 tablet (4 mg total) by mouth every 8 (eight) hours as needed for muscle spasms. 90 tablet 2   No current facility-administered medications for this visit.    Social History   Social History  . Marital Status: Widowed    Spouse Name: N/A  . Number of Children: 5  . Years of Education: 9   Occupational History  . Oncologist     retired   Science writer  History Main Topics  . Smoking status: Current Every Day Smoker -- 1.00 packs/day for 62 years    Types: Cigarettes  . Smokeless tobacco: Never Used  . Alcohol Use: 0.0 oz/week     Comment: occasionally - few drinks/month  . Drug Use: No  . Sexual Activity: Not Currently   Other Topics Concern  . Not on file   Social History Narrative   9th grade. married '54- widowed Dec 10th'08. 4 sons - '55, '58, '60, '69; 1 daughter - '63; 12 grandchildren; 9 great-grandchildren. 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 Other children are close and attentive. ACP/End-of-life: No CPR, no mechanical ventilation, no futile/heroic measures.    Family History  Problem Relation Age of Onset  . Aneurysm Mother     AAA  . Hypertension Mother   . Hyperlipidemia Mother   . Heart disease Father     CAD, MI x 3  . Heart attack Father   . Cancer Maternal Aunt     breast cancer  . Diabetes Sister   . Hyperlipidemia Sister   . Hypertension Sister       Alvino Chapel, MD 03/17/2015, 11:44 AM

## 2015-03-17 NOTE — Patient Instructions (Signed)
We will call you with the lab results from today. Follow-up with Dr. Fermin Schwab in 6 months as scheduled. Call our office sooner with any questions or concerns.

## 2015-03-22 ENCOUNTER — Other Ambulatory Visit: Payer: Self-pay | Admitting: *Deleted

## 2015-03-22 MED ORDER — FUROSEMIDE 20 MG PO TABS: 20.0000 mg | ORAL_TABLET | Freq: Every day | ORAL | Status: DC | PRN

## 2015-03-23 ENCOUNTER — Other Ambulatory Visit: Payer: Self-pay | Admitting: *Deleted

## 2015-03-23 LAB — INHIBIN B: Inhibin B: <10 pg/mL

## 2015-03-23 LAB — ANTI MULLERIAN HORMONE

## 2015-03-23 MED ORDER — TIZANIDINE HCL 4 MG PO TABS: 4.0000 mg | ORAL_TABLET | Freq: Three times a day (TID) | ORAL | Status: DC | PRN

## 2015-03-24 ENCOUNTER — Encounter: Payer: Self-pay | Admitting: Family

## 2015-03-28 ENCOUNTER — Other Ambulatory Visit: Payer: Self-pay | Admitting: Family

## 2015-03-28 ENCOUNTER — Ambulatory Visit (HOSPITAL_COMMUNITY)
Admission: RE | Admit: 2015-03-28 | Discharge: 2015-03-28 | Disposition: A | Discharge status: Home / Self Care | Payer: Medicare Other | Source: Ambulatory Visit | Attending: Family | Admitting: Family

## 2015-03-28 ENCOUNTER — Ambulatory Visit (INDEPENDENT_AMBULATORY_CARE_PROVIDER_SITE_OTHER): Payer: Medicare Other | Admitting: Family

## 2015-03-28 ENCOUNTER — Telehealth: Payer: Self-pay

## 2015-03-28 ENCOUNTER — Encounter: Payer: Self-pay | Admitting: Family

## 2015-03-28 VITALS — BP 151/75 | HR 66 | Temp 97.8°F | Resp 16 | Ht 62.0 in | Wt 149.0 lb

## 2015-03-28 DIAGNOSIS — Z720 Tobacco use: Secondary | ICD-10-CM | POA: Diagnosis not present

## 2015-03-28 DIAGNOSIS — E785 Hyperlipidemia, unspecified: Secondary | ICD-10-CM | POA: Diagnosis not present

## 2015-03-28 DIAGNOSIS — I739 Peripheral vascular disease, unspecified: Secondary | ICD-10-CM | POA: Insufficient documentation

## 2015-03-28 DIAGNOSIS — I714 Abdominal aortic aneurysm, without rupture, unspecified: Secondary | ICD-10-CM

## 2015-03-28 DIAGNOSIS — K458 Other specified abdominal hernia without obstruction or gangrene: Secondary | ICD-10-CM | POA: Diagnosis not present

## 2015-03-28 DIAGNOSIS — F17200 Nicotine dependence, unspecified, uncomplicated: Secondary | ICD-10-CM

## 2015-03-28 DIAGNOSIS — Z8249 Family history of ischemic heart disease and other diseases of the circulatory system: Secondary | ICD-10-CM

## 2015-03-28 DIAGNOSIS — I10 Essential (primary) hypertension: Secondary | ICD-10-CM | POA: Diagnosis not present

## 2015-03-28 NOTE — Progress Notes (Signed)
Filed Vitals:   03/28/15 0848 03/28/15 0851  BP: 168/74 151/75  Pulse: 66 66  Temp: 97.8 F (36.6 C)   Resp: 16   Height: '5\' 2"'$  (1.575 m)   Weight: 149 lb (67.586 kg)

## 2015-03-28 NOTE — Progress Notes (Signed)
[facility] [place]  Established Abdominal Aortic Aneurysm  History of Present Illness  Tammy Brooks is a 81 y.o. (1934-05-05) female patient of Dr. Donnetta Hutching who returns today for surveillance and discussion of AAA. At her October 2015 visit with Dr. 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 There is also a new finding of a 9 cm pelvic mass. Radiologist interpretation is concerning for ovarian malignancy. Dr. Donnetta Hutching discussed this with her internist Dr Alain Marion, who as to coordinate referral to a gyn specialist. She had a possible abdominal BSO, in November 2015 for a pelvic mass, pt states this was not cancer. Pt states she had previously had a vaginal hysterectomy. She does have a known lower abdominal hernia since this surgery and this is being followed by one of her providers, this is mildly painful. Pt states she has inflammation in her low back and both hips,  has pain in these areas, denies radiculopathy type pain. Pt states her mother and maternal aunt had a AAA.   The patient denies claudication in legs with walking. The patient reports history of stroke in 2000 as manifested by syncope, from this event denies any residual neurological deficit. Pt states Dr. Gwenlyn Found regularly checks her carotid arteries by ultrasound. She is blind in her left eye which she attributes as related to cataract surgery.  Pt states her gynecologist, Dr. Adan Sis who performed the BSO and tumor removal asked pt to ask our practice for a referal to a surgeon for repair of an abdominal hernia below the mid abdominal incision  Pt Diabetic: No Pt smoker: smoker (1 ppd, started at age 46 yrs). Pt states she "can't quit".    Past Medical History  Diagnosis Date  . Arthritis   . Blind left eye   . Heart murmur   . Hyperlipidemia   . Hypertension   . Myocardial infarction [facility])     mild/ age 25  . Ulcer   . CAD (coronary artery  disease) 08/1995    50-55% second diag dz.; occluded OM  . PAD (peripheral artery disease) ([facility])     mild to mod Rt ICA stenosis followed by doppler-02/05/12  . Echocardiogram abnormal 03/2012    mild to mod AS, valve area1.58 cm2, mild concentric LVH, grade I diastolic dusfunction  . Tobacco abuse   . Abdominal aortic aneurysm ([place])     followed by Dr. Sherren Mocha Early  . Complication of anesthesia     "Blood pressure went down" with retinal surgery - no problems with previous surgeries  . Stroke [facility]) 2001    no residual problems  . Gout   . Pelvic mass    Past Surgical History  Procedure Laterality Date  . Cataract extraction w/ intraocular lens  implant, bilateral    . Eye surgery      lense implant removed/ blind in left eye  . Upper gastrointestinal endoscopy      treated for h-pylori  . Colonoscopy    . Polypectomy    . Tubal ligation    . Appendectomy    . Tonsillectomy and adenoidectomy    . Abdominal hysterectomy    . Cardiac catheterization  08/1995    50-55% second diag lesion, occluded OM  . Retinal detachment surgery  2006  . Laparotomy N/A 04/26/2014    Procedure: EXPLORATORY LAPAROTOMY/RESECTION OF PELVIC MASS;  Surgeon: Alvino Chapel, MD;  Location: [facility];  Service: Gynecology;  Laterality: N/A;   Social History Social History   Social History  . Marital Status: Widowed    Spouse Name: N/A  . Number of Children: 5  . Years of Education: 9   Occupational History  . Oncologist     retired   Social History Main Topics  . Smoking status: Current Every Day Smoker -- 1.00 packs/day for 62 years    Types: Cigarettes  . Smokeless tobacco: Never Used  . Alcohol Use: 0.0 oz/week     Comment: occasionally - few drinks/month  . Drug Use: No  . Sexual Activity: Not Currently   Other Topics Concern  . Not on file   Social History Narrative   9th grade. married '54- widowed Dec 10th'08. 4 sons - '55, '58, '60, '69; 1 daughter - '63; 12  grandchildren; 9 great-grandchildren. 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 Other children are close and attentive. ACP/End-of-life: No CPR, no mechanical ventilation, no futile/heroic measures.   Family History Family History  Problem Relation Age of Onset  . Aneurysm Mother     AAA  . Hypertension Mother   . Hyperlipidemia Mother   . Heart disease Father     CAD, MI x 3  . Heart attack Father   . Cancer Maternal Aunt     breast cancer  . Diabetes Sister   . Hyperlipidemia Sister   . Hypertension Sister     Current Outpatient Prescriptions on File Prior to Visit  Medication Sig Dispense Refill  . allopurinol (ZYLOPRIM) 100 MG tablet Take 1 tablet (100 mg total) by mouth daily. 90 tablet 3  . amLODipine (NORVASC) 5 MG tablet Take 1 tablet (5 mg total) by mouth daily. 90 tablet 2  . aspirin EC 81 MG tablet Take 81 mg by mouth daily.    . cholecalciferol (VITAMIN D) 1000 UNITS tablet Take 1 tablet (1,000 Units total) by mouth daily. 100 tablet 3  . colchicine 0.6 MG tablet Take 1 tablet (0.6 mg total) by mouth 4 (four) times daily as needed. 360 tablet 2  . dorzolamide-timolol (COSOPT) 22.3-6.8 MG/ML ophthalmic solution Place 1 drop into the left eye daily. (Patient taking differently: Place 1 drop into the left eye daily. 2-0.5% sol 10.0  One drop Left eye daily) 10 mL 3  . estradiol (ESTRACE) 0.5 MG tablet Take 1 tablet (0.5 mg total) by mouth daily. 90 tablet 2  . furosemide (LASIX) 20 MG tablet Take 1-2 tablets (20-40 mg total) by mouth daily as needed for edema. 180 tablet 0  . lisinopril (PRINIVIL,ZESTRIL) 20 MG tablet Take 1 tablet (20 mg total) by mouth 2 (two) times daily. 180 tablet 2  . meloxicam (MOBIC) 15 MG tablet Take 1 tablet by mouth  daily as needed for pain 90 tablet 2  . metoprolol (LOPRESSOR) 50 MG tablet Take 0.5 tablets (25 mg total) by mouth 2 (two) times daily. 180 tablet 2  . Polyethylene Glycol 3350 POWD as needed.      . simvastatin (ZOCOR) 20 MG tablet Take 20 mg by mouth daily.    Marland Kitchen tiZANidine (ZANAFLEX) 4 MG tablet Take 1 tablet (4 mg total) by mouth every 8 (eight) hours as needed for muscle spasms. 90 tablet 2   No current facility-administered medications on file prior to visit.   Allergies  Allergen Reactions  . Nabumetone Anaphylaxis and Other (See Comments)    Causes inflammation    ROS: See HPI  for pertinent positives and negatives.  Physical Examination  Filed Vitals:   03/28/15 0848 03/28/15 0851  BP: 168/74 151/75  Pulse: 66 66  Temp: 97.8 F (36.6 C)   Resp: 16   Height: '5\' 2"'$  (1.575 m)   Weight: 149 lb (67.586 kg)    Body mass index is 27.25 kg/(m^2).   General: A&O x 3, WD.  Eyes: Left pupil is larger than right, is irregular in shape, and is non reactive to light.  Pulmonary: Sym exp, fair air movt, CTAB, no rales, rhonchi, + transient wheezes.  Cardiac: RRR, Nl S1, S2, + murmur.   Carotid Bruits Right Left   transmitted cardiac murmur Transmitted cardiac murmur  Aorta is not palpable Radial pulses are 2+ palpable and =   VASCULAR EXAM:     LE Pulses Right Left   FEMORAL  palpable  palpable    POPLITEAL not palpable  not palpable   POSTERIOR TIBIAL not palpable  not palpable    DORSALIS PEDIS  ANTERIOR TIBIAL faintly palpable  1+ palpable      Gastrointestinal: soft, NTND, -G/R, - HSM, - CVAT B, sagging area of tissue at low mid abdomen that pt states is a hernia, midline well healed abdominal scar noted.  Musculoskeletal: M/S 5/5 throughout, Extremities without ischemic changes.  Neurologic: CN 2-12 intact, Pain and light touch intact in extremities are intact except, Motor exam as listed above.          Non-Invasive Vascular  Imaging  AAA Duplex (03/28/2015) ABDOMINAL AORTA DUPLEX EVALUATION    INDICATION: Abdominal aortic aneurysm     PREVIOUS INTERVENTION(S): NA    DUPLEX EXAM:     LOCATION DIAMETER AP (cm) DIAMETER TRANSVERSE (cm) VELOCITIES (cm/sec)  Aorta Proximal 2.30 2.29 56  Aorta Mid 2.36 2.17 19  Aorta Distal 4.30 4.45 32  Right Common Iliac Artery 1.04 1.06 160  Left Common Iliac Artery 1.04 1.14 110    Previous max aortic diameter:  4.32 x 4.38 Date: 09/20/2014  ADDITIONAL FINDINGS:     IMPRESSION: Abdominal aortic aneurysm present measuring approximately 4.30cm AP x 4.45cm TRV, with non-occluding intramural thrombus present as well as proximal vessel tortuousity/angulation present.    Compared to the previous exam:  Essentially unchanged since previous study on 09/20/2014.     Medical Decision Making  The patient is a 81 y.o. female who presents with asymptomatic AAA with no increase in size.  Unfortunately she continues to smoke and states that she cannot quit. The patient was counseled re smoking cessation and given several free resources re smoking cessation.  Pt requesting referral to a surgeon for repair of abdominal hernia that appeared s/p abdominal tumor removal by Dr. Adan Sis; pt states Dr. Dianah Field is requesting the referral from Dr. Donnetta Hutching. I spoke with Dr. Donnetta Hutching. Will refer to [facility] for evaluation and treatment. Dr. Donnetta Hutching indicated that the patient's abdominal aortic aneurysm is not a contraindication to repair of her lower abdominal hernia.  Based on this patient's exam and diagnostic studies, the patient will follow up in 6 months  with the following studies: AAA duplex.  Consideration for repair of AAA would be made when the size is 5.5 cm, growth > 1 cm/yr, and symptomatic status.  I emphasized the importance of maximal medical management including strict control of blood pressure, blood glucose, and lipid levels, antiplatelet agents, obtaining regular  exercise, and cessation of smoking.   The patient was given information about AAA including signs, symptoms, treatment, and  how to minimize the risk of enlargement and rupture of aneurysms.    The patient was advised to call 911 should the patient experience sudden onset abdominal or back pain.   Thank you for allowing [place] to participate in this patient's care.  Clemon Chambers, RN, MSN, FNP-C [facility] [place] Office: [phone]  Clinic Physician: Early  03/28/2015, 8:48 AM

## 2015-03-28 NOTE — Telephone Encounter (Signed)
Orders received from [place] to contact the patient and update with result from 03/17/15 from visit with Dr Aldean Ast . Patient contacted with results from 03/17/15 ,  AMH < 0.03 were "normal" . No answer ,  left a detailed message with call back requested to ensure the message was received, our call back information was given [phone].

## 2015-03-28 NOTE — Patient Instructions (Addendum)
Abdominal Aortic Aneurysm An aneurysm is a weakened or damaged part of an artery wall that bulges from the normal force of blood pumping through the body. An abdominal aortic aneurysm is an aneurysm that occurs in the lower part of the aorta, the main artery of the body.  The major concern with an abdominal aortic aneurysm is that it can enlarge and burst (rupture) or blood can flow between the layers of the wall of the aorta through a tear (aorticdissection). Both of these conditions can cause bleeding inside the body and can be life threatening unless diagnosed and treated promptly. CAUSES  The exact cause of an abdominal aortic aneurysm is unknown. Some contributing factors are:   A hardening of the arteries caused by the buildup of fat and other substances in the lining of a blood vessel (arteriosclerosis).  Inflammation of the walls of an artery (arteritis).   Connective tissue diseases, such as Marfan syndrome.   Abdominal trauma.   An infection, such as syphilis or staphylococcus, in the wall of the aorta (infectious aortitis) caused by bacteria. RISK FACTORS  Risk factors that contribute to an abdominal aortic aneurysm may include:  Age older than 60 years.   High blood pressure (hypertension).  Female gender.  Ethnicity (white race).  Obesity.  Family history of aneurysm (first degree relatives only).  Tobacco use. PREVENTION  The following healthy lifestyle habits may help decrease your risk of abdominal aortic aneurysm:  Quitting smoking. Smoking can raise your blood pressure and cause arteriosclerosis.  Limiting or avoiding alcohol.  Keeping your blood pressure, blood sugar level, and cholesterol levels within normal limits.  Decreasing your salt intake. In somepeople, too much salt can raise blood pressure and increase your risk of abdominal aortic aneurysm.  Eating a diet low in saturated fats and cholesterol.  Increasing your fiber intake by including  whole grains, vegetables, and fruits in your diet. Eating these foods may help lower blood pressure.  Maintaining a healthy weight.  Staying physically active and exercising regularly. SYMPTOMS  The symptoms of abdominal aortic aneurysm may vary depending on the size and rate of growth of the aneurysm.Most grow slowly and do not have any symptoms. When symptoms do occur, they may include:  Pain (abdomen, side, lower back, or groin). The pain may vary in intensity. A sudden onset of severe pain may indicate that the aneurysm has ruptured.  Feeling full after eating only small amounts of food.  Nausea or vomiting or both.  Feeling a pulsating lump in the abdomen.  Feeling faint or passing out. DIAGNOSIS  Since most unruptured abdominal aortic aneurysms have no symptoms, they are often discovered during diagnostic exams for other conditions. An aneurysm may be found during the following procedures:  Ultrasonography (A one-time screening for abdominal aortic aneurysm by ultrasonography is also recommended for all men aged 65-75 years who have ever smoked).  X-ray exams.  A computed tomography (CT).  Magnetic resonance imaging (MRI).  Angiography or arteriography. TREATMENT  Treatment of an abdominal aortic aneurysm depends on the size of your aneurysm, your age, and risk factors for rupture. Medication to control blood pressure and pain may be used to manage aneurysms smaller than 6 cm. Regular monitoring for enlargement may be recommended by your caregiver if:  The aneurysm is 3-4 cm in size (an annual ultrasonography may be recommended).  The aneurysm is 4-4.5 cm in size (an ultrasonography every 6 months may be recommended).  The aneurysm is larger than 4.5 cm in   size (your caregiver may ask that you be examined by a vascular surgeon). If your aneurysm is larger than 6 cm, surgical repair may be recommended. There are two main methods for repair of an aneurysm:   Endovascular  repair (a minimally invasive surgery). This is done most often.  Open repair. This method is used if an endovascular repair is not possible.   This information is not intended to replace advice given to you by your health care provider. Make sure you discuss any questions you have with your health care provider.   Document Released: 02/27/2005 Document Revised: 09/14/2012 Document Reviewed: 06/19/2012 Elsevier Interactive Patient Education [employer].  On 03/28/15 your abdominal aortic aneurysm largest diameter measures 4.30 cm x 4.45 cm.    Smoking Cessation, Tips for Success If you are ready to quit smoking, congratulations! You have chosen to help yourself be healthier. Cigarettes bring nicotine, tar, carbon monoxide, and other irritants into your body. Your lungs, heart, and blood vessels will be able to work better without these poisons. There are many different ways to quit smoking. Nicotine gum, nicotine patches, a nicotine inhaler, or nicotine nasal spray can help with physical craving. Hypnosis, support groups, and medicines help break the habit of smoking. WHAT THINGS CAN I DO TO MAKE QUITTING EASIER?  Here are some tips to help you quit for good:  Pick a date when you will quit smoking completely. Tell all of your friends and family about your plan to quit on that date.  Do not try to slowly cut down on the number of cigarettes you are smoking. Pick a quit date and quit smoking completely starting on that day.  Throw away all cigarettes.   Clean and remove all ashtrays from your home, work, and car.  On a card, write down your reasons for quitting. Carry the card with you and read it when you get the urge to smoke.  Cleanse your body of nicotine. Drink enough water and fluids to keep your urine clear or pale yellow. Do this after quitting to flush the nicotine from your body.  Learn to predict your moods. Do not let a bad situation be your excuse to have a cigarette.  Some situations in your life might tempt you into wanting a cigarette.  Never have "just one" cigarette. It leads to wanting another and another. Remind yourself of your decision to quit.  Change habits associated with smoking. If you smoked while driving or when feeling stressed, try other activities to replace smoking. Stand up when drinking your coffee. Brush your teeth after eating. Sit in a different chair when you read the paper. Avoid alcohol while trying to quit, and try to drink fewer caffeinated beverages. Alcohol and caffeine may urge you to smoke.  Avoid foods and drinks that can trigger a desire to smoke, such as sugary or spicy foods and alcohol.  Ask people who smoke not to smoke around you.  Have something planned to do right after eating or having a cup of coffee. For example, plan to take a walk or exercise.  Try a relaxation exercise to calm you down and decrease your stress. Remember, you may be tense and nervous for the first 2 weeks after you quit, but this will pass.  Find new activities to keep your hands busy. Play with a pen, coin, or rubber band. Doodle or draw things on paper.  Brush your teeth right after eating. This will help cut down on the craving for  the taste of tobacco after meals. You can also try mouthwash.   Use oral substitutes in place of cigarettes. Try using lemon drops, carrots, cinnamon sticks, or chewing gum. Keep them handy so they are available when you have the urge to smoke.  When you have the urge to smoke, try deep breathing.  Designate your home as a nonsmoking area.  If you are a heavy smoker, ask your health care provider about a prescription for nicotine chewing gum. It can ease your withdrawal from nicotine.  Reward yourself. Set aside the cigarette money you save and buy yourself something nice.  Look for support from others. Join a support group or smoking cessation program. Ask someone at home or at work to help you with your  plan to quit smoking.  Always ask yourself, "Do I need this cigarette or is this just a reflex?" Tell yourself, "Today, I choose not to smoke," or "I do not want to smoke." You are reminding yourself of your decision to quit.  Do not replace cigarette smoking with electronic cigarettes (commonly called e-cigarettes). The safety of e-cigarettes is unknown, and some may contain harmful chemicals.  If you relapse, do not give up! Plan ahead and think about what you will do the next time you get the urge to smoke. HOW WILL I FEEL WHEN I QUIT SMOKING? You may have symptoms of withdrawal because your body is used to nicotine (the addictive substance in cigarettes). You may crave cigarettes, be irritable, feel very hungry, cough often, get headaches, or have difficulty concentrating. The withdrawal symptoms are only temporary. They are strongest when you first quit but will go away within 10-14 days. When withdrawal symptoms occur, stay in control. Think about your reasons for quitting. Remind yourself that these are signs that your body is healing and getting used to being without cigarettes. Remember that withdrawal symptoms are easier to treat than the major diseases that smoking can cause.  Even after the withdrawal is over, expect periodic urges to smoke. However, these cravings are generally short lived and will go away whether you smoke or not. Do not smoke! WHAT RESOURCES ARE AVAILABLE TO HELP ME QUIT SMOKING? Your health care provider can direct you to community resources or hospitals for support, which may include:  Group support.  Education.  Hypnosis.  Therapy.   This information is not intended to replace advice given to you by your health care provider. Make sure you discuss any questions you have with your health care provider.   Document Released: 02/16/2004 Document Revised: 06/10/2014 Document Reviewed: 11/05/2012 Elsevier Interactive Patient Education 2016 [employer].   Steps to Quit Smoking  Smoking tobacco can be harmful to your health and can affect almost every organ in your body. Smoking puts you, and those around you, at risk for developing many serious chronic diseases. Quitting smoking is difficult, but it is one of the best things that you can do for your health. It is never too late to quit. WHAT ARE THE BENEFITS OF QUITTING SMOKING? When you quit smoking, you lower your risk of developing serious diseases and conditions, such as:  Lung cancer or lung disease, such as COPD.  Heart disease.  Stroke.  Heart attack.  Infertility.  Osteoporosis and bone fractures. Additionally, symptoms such as coughing, wheezing, and shortness of breath may get better when you quit. You may also find that you get sick less often because your body is stronger at fighting off colds and infections. If  you are pregnant, quitting smoking can help to reduce your chances of having a baby of low birth weight. HOW DO I GET READY TO QUIT? When you decide to quit smoking, create a plan to make sure that you are successful. Before you quit:  Pick a date to quit. Set a date within the next two weeks to give you time to prepare.  Write down the reasons why you are quitting. Keep this list in places where you will see it often, such as on your bathroom mirror or in your car or wallet.  Identify the people, places, things, and activities that make you want to smoke (triggers) and avoid them. Make sure to take these actions:  Throw away all cigarettes at home, at work, and in your car.  Throw away smoking accessories, such as Scientist, research (medical).  Clean your car and make sure to empty the ashtray.  Clean your home, including curtains and carpets.  Tell your family, friends, and coworkers that you are quitting. Support from your loved ones can make quitting easier.  Talk with your health care provider about your options for quitting smoking.  Find out what  treatment options are covered by your health insurance. WHAT STRATEGIES CAN I USE TO QUIT SMOKING?  Talk with your healthcare provider about different strategies to quit smoking. Some strategies include:  Quitting smoking altogether instead of gradually lessening how much you smoke over a period of time. Research shows that quitting "cold [place]" is more successful than gradually quitting.  Attending in-person counseling to help you build problem-solving skills. You are more likely to have success in quitting if you attend several counseling sessions. Even short sessions of 10 minutes can be effective.  Finding resources and support systems that can help you to quit smoking and remain smoke-free after you quit. These resources are most helpful when you use them often. They can include:  Online chats with a Social worker.  Telephone quitlines.  Printed Furniture conservator/restorer.  Support groups or group counseling.  Text messaging programs.  Mobile phone applications.  Taking medicines to help you quit smoking. (If you are pregnant or breastfeeding, talk with your health care provider first.) Some medicines contain nicotine and some do not. Both types of medicines help with cravings, but the medicines that include nicotine help to relieve withdrawal symptoms. Your health care provider may recommend:  Nicotine patches, gum, or lozenges.  Nicotine inhalers or sprays.  Non-nicotine medicine that is taken by mouth. Talk with your health care provider about combining strategies, such as taking medicines while you are also receiving in-person counseling. Using these two strategies together makes you more likely to succeed in quitting than if you used either strategy on its own. If you are pregnant or breastfeeding, talk with your health care provider about finding counseling or other support strategies to quit smoking. Do not take medicine to help you quit smoking unless told to do so by your health  care provider. WHAT THINGS CAN I DO TO MAKE IT EASIER TO QUIT? Quitting smoking might feel overwhelming at first, but there is a lot that you can do to make it easier. Take these important actions:  Reach out to your family and friends and ask that they support and encourage you during this time. Call telephone quitlines, reach out to support groups, or work with a counselor for support.  Ask people who smoke to avoid smoking around you.  Avoid places that trigger you to smoke, such as bars,  parties, or smoke-break areas at work.  Spend time around people who do not smoke.  Lessen stress in your life, because stress can be a smoking trigger for some people. To lessen stress, try:  Exercising regularly.  Deep-breathing exercises.  Yoga.  Meditating.  Performing a body scan. This involves closing your eyes, scanning your body from head to toe, and noticing which parts of your body are particularly tense. Purposefully relax the muscles in those areas.  Download or purchase mobile phone or tablet apps (applications) that can help you stick to your quit plan by providing reminders, tips, and encouragement. There are many free apps, such as QuitGuide from the [employer] Office manager for Disease Control and Prevention). You can find other support for quitting smoking (smoking cessation) through smokefree.gov and other websites. HOW WILL I FEEL WHEN I QUIT SMOKING? Within the first 24 hours of quitting smoking, you may start to feel some withdrawal symptoms. These symptoms are usually most noticeable 2-3 days after quitting, but they usually do not last beyond 2-3 weeks. Changes or symptoms that you might experience include:  Mood swings.  Restlessness, anxiety, or irritation.  Difficulty concentrating.  Dizziness.  Strong cravings for sugary foods in addition to nicotine.  Mild weight gain.  Constipation.  Nausea.  Coughing or a sore throat.  Changes in how your medicines work in your  body.  A depressed mood.  Difficulty sleeping (insomnia). After the first 2-3 weeks of quitting, you may start to notice more positive results, such as:  Improved sense of smell and taste.  Decreased coughing and sore throat.  Slower heart rate.  Lower blood pressure.  Clearer skin.  The ability to breathe more easily.  Fewer sick days. Quitting smoking is very challenging for most people. Do not get discouraged if you are not successful the first time. Some people need to make many attempts to quit before they achieve long-term success. Do your best to stick to your quit plan, and talk with your health care provider if you have any questions or concerns.   This information is not intended to replace advice given to you by your health care provider. Make sure you discuss any questions you have with your health care provider.   Document Released: 05/14/2001 Document Revised: 10/04/2014 Document Reviewed: 10/04/2014 Elsevier Interactive Patient Education [employer].

## 2015-04-18 ENCOUNTER — Other Ambulatory Visit: Payer: Self-pay | Admitting: Internal Medicine

## 2015-04-21 ENCOUNTER — Encounter: Payer: Self-pay | Admitting: Cardiovascular Disease

## 2015-04-21 ENCOUNTER — Ambulatory Visit (INDEPENDENT_AMBULATORY_CARE_PROVIDER_SITE_OTHER): Payer: Medicare Other | Admitting: Cardiovascular Disease

## 2015-04-21 VITALS — BP 172/82 | HR 67 | Ht 62.0 in | Wt 150.0 lb

## 2015-04-21 DIAGNOSIS — F17200 Nicotine dependence, unspecified, uncomplicated: Secondary | ICD-10-CM | POA: Diagnosis not present

## 2015-04-21 DIAGNOSIS — I10 Essential (primary) hypertension: Secondary | ICD-10-CM

## 2015-04-21 DIAGNOSIS — Z8679 Personal history of other diseases of the circulatory system: Secondary | ICD-10-CM

## 2015-04-21 DIAGNOSIS — E785 Hyperlipidemia, unspecified: Secondary | ICD-10-CM | POA: Diagnosis not present

## 2015-04-21 MED ORDER — LOSARTAN POTASSIUM 100 MG PO TABS: 100.0000 mg | ORAL_TABLET | Freq: Every day | ORAL | Status: DC

## 2015-04-21 MED ORDER — AMLODIPINE BESYLATE 10 MG PO TABS: 10.0000 mg | ORAL_TABLET | Freq: Every day | ORAL | Status: DC

## 2015-04-21 NOTE — Assessment & Plan Note (Signed)
Continues to smoke one pack per day recalcitrant to risk factor modification

## 2015-04-21 NOTE — Assessment & Plan Note (Signed)
History of hyperlipidemia on simvastatin followed by her PCP

## 2015-04-21 NOTE — Assessment & Plan Note (Signed)
History of hypertension blood pressure measured at 172/82. She is on lisinopril, amlodipine and metoprolol. I'm going to increase the amlodipine from 5-10 mg a day, change lisinopril to losartan. She'll keep a blood pressure log daily for the next 4 weeks and will follow-up with Erasmo Downer after that for further medicine titration.

## 2015-04-21 NOTE — Progress Notes (Signed)
04/21/2015 Tammy Brooks   1934-05-28  [id number]  Primary Physician Walker Kehr, MD Primary Cardiologist: Lorretta Harp MD Renae Gloss    HPI:  Tammy Brooks is a 81 year old mildly overweight widowed Caucasian female mother of 24, grandmother of 71 grandchildren well last saw 04/13/14 . She has a history of mild CAD by cath in 1997. Her lipid problems include continued tobacco abuse one pack per day, hypertension, and hyperlipidemia all medically treated. She does have mild to moderate aortic stenosis with valve area of 1 cm2 by 2-D echo one year ago.. She also has moderate right internal carotid artery stenosis by duplex ultrasound. She was recently diagnosed with a small abdominal aortic and iliac aneurysm followed by Dr. Donnetta Hutching. She denies chest pain or shortness of breath. She was found to have a pelvic mass which was surgically excised last year and found to be benign. She does have a hernia as result of this.   Current Outpatient Prescriptions  Medication Sig Dispense Refill  . allopurinol (ZYLOPRIM) 100 MG tablet Take 1 tablet (100 mg total) by mouth daily. 90 tablet 3  . amLODipine (NORVASC) 5 MG tablet Take 1 tablet (5 mg total) by mouth daily. 90 tablet 2  . aspirin EC 81 MG tablet Take 81 mg by mouth daily.    . cholecalciferol (VITAMIN D) 1000 UNITS tablet Take 1 tablet (1,000 Units total) by mouth daily. 100 tablet 3  . colchicine 0.6 MG tablet Take 1 tablet (0.6 mg total) by mouth 4 (four) times daily as needed. 360 tablet 2  . dorzolamide-timolol (COSOPT) 22.3-6.8 MG/ML ophthalmic solution Place 1 drop into the left eye daily. (Patient taking differently: Place 1 drop into the left eye daily. 2-0.5% sol 10.0  One drop Left eye daily) 10 mL 3  . estradiol (ESTRACE) 0.5 MG tablet Take 1 tablet (0.5 mg total) by mouth daily. 90 tablet 2  . furosemide (LASIX) 20 MG tablet TAKE 1-2 TABLETS BY MOUTH  DAILY AS NEEDED FOR EDEMA. 180 tablet 1  . meloxicam (MOBIC) 15 MG  tablet Take 1 tablet by mouth  daily as needed for pain 90 tablet 2  . metoprolol (LOPRESSOR) 50 MG tablet Take 0.5 tablets (25 mg total) by mouth 2 (two) times daily. 180 tablet 2  . naproxen sodium (ANAPROX) 220 MG tablet Take 220 mg by mouth as needed.    . Polyethylene Glycol 3350 POWD as needed.     . simvastatin (ZOCOR) 20 MG tablet Take 20 mg by mouth daily.    Marland Kitchen tiZANidine (ZANAFLEX) 4 MG tablet Take 1 tablet (4 mg total) by mouth every 8 (eight) hours as needed for muscle spasms. 90 tablet 2   No current facility-administered medications for this visit.    Allergies  Allergen Reactions  . Nabumetone Anaphylaxis and Other (See Comments)    Causes inflammation    Social History   Social History  . Marital Status: Widowed    Spouse Name: N/A  . Number of Children: 5  . Years of Education: 9   Occupational History  . Oncologist     retired   Social History Main Topics  . Smoking status: Current Every Day Smoker -- 1.00 packs/day for 62 years    Types: Cigarettes  . Smokeless tobacco: Never Used  . Alcohol Use: 0.0 oz/week    0 Standard drinks or equivalent per week     Comment: occasionally - few drinks/month  . Drug Use: No  .  Sexual Activity: Not Currently   Other Topics Concern  . Not on file   Social History Narrative   9th grade. married '54- widowed Dec 10th'08. 4 sons - '55, '58, '60, '69; 1 daughter - '63; 12 grandchildren; 9 great-grandchildren. 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 Other children are close and attentive. ACP/End-of-life: No CPR, no mechanical ventilation, no futile/heroic measures.     Review of Systems: General: negative for chills, fever, night sweats or weight changes.  Cardiovascular: negative for chest pain, dyspnea on exertion, edema, orthopnea, palpitations, paroxysmal nocturnal dyspnea or shortness of breath Dermatological: negative for rash Respiratory: negative for cough or  wheezing Urologic: negative for hematuria Abdominal: negative for nausea, vomiting, diarrhea, bright red blood per rectum, melena, or hematemesis Neurologic: negative for visual changes, syncope, or dizziness All other systems reviewed and are otherwise negative except as noted above.    Blood pressure 172/82, pulse 67, height '5\' 2"'$  (1.575 m), weight 150 lb (68.04 kg).  General appearance: alert and no distress Neck: no adenopathy, no JVD, supple, symmetrical, trachea midline, thyroid not enlarged, symmetric, no tenderness/mass/nodules and soft bilateral carotid bruits Lungs: clear to auscultation bilaterally Heart: 88/3 systolic ejection murmur at the base consistent with aortic stenosis Extremities: extremities normal, atraumatic, no cyanosis or edema  EKG normal sinus rhythm at 67 without ST or T-wave changes. There were Q waves in leads V1 through V3 consistent with an old anteroseptal myocardial infarction. I personally reviewed this EKGges  ASSESSMENT AND PLAN:   TOBACCO ABUSE Continues to smoke one pack per day recalcitrant to risk factor modification  Hx of carotid artery stenosis, RT. -moderate, followed by Dr. Gwenlyn Found, Last dopplers 02/2012 History of mild bilateral internal carotid artery stenosis which we follow annually by duplex ultrasound  Dyslipidemia History of hyperlipidemia on simvastatin followed by her PCP  Aortic stenosis History of mild to moderate aortic stenosis with 2-D echo performed a year ago that showed a valve area of 1 cm2 and normal LV function. She does have a loud outflow tract murmur. We will recheck a 2-D echocardiogram  AAA (abdominal aortic aneurysm) without rupture Small abdominal aortic aneurysm measuring approximately 4.3 x 4.5 cm.  Essential hypertension History of hypertension blood pressure measured at 172/82. She is on lisinopril, amlodipine and metoprolol. I'm going to increase the amlodipine from 5-10 mg a day, change lisinopril to  losartan. She'll keep a blood pressure log daily for the next 4 weeks and will follow-up with Erasmo Downer after that for further medicine titration.      Lorretta Harp MD FACP,FACC,FAHA, [facility] 04/21/2015 12:10 PM

## 2015-04-21 NOTE — Assessment & Plan Note (Signed)
Small abdominal aortic aneurysm measuring approximately 4.3 x 4.5 cm.

## 2015-04-21 NOTE — Assessment & Plan Note (Signed)
History of mild bilateral internal carotid artery stenosis which we follow annually by duplex ultrasound

## 2015-04-21 NOTE — Assessment & Plan Note (Signed)
History of mild to moderate aortic stenosis with 2-D echo performed a year ago that showed a valve area of 1 cm2 and normal LV function. She does have a loud outflow tract murmur. We will recheck a 2-D echocardiogram

## 2015-04-21 NOTE — Patient Instructions (Addendum)
Medication Instructions:  Your physician has recommended you make the following change in your medication:  1) STOP Lisinopril 2) INCREASE Amalodpine to 10 mg by mouth ONCE Daily 3) START Losartan 100 mg by mouth ONCE daily   Labwork: none  Testing/Procedures: Your physician has requested that you have a carotid duplex. This test is an ultrasound of the carotid arteries in your neck. It looks at blood flow through these arteries that supply the brain with blood. Allow one hour for this exam. There are no restrictions or special instructions.  Your physician has requested that you have an echocardiogram. Echocardiography is a painless test that uses sound waves to create images of your heart. It provides your doctor with information about the size and shape of your heart and how well your heart's chambers and valves are working. This procedure takes approximately one hour. There are no restrictions for this procedure.    Follow-Up: Your physician recommends that you schedule a follow-up appointment in: 1 month with Erasmo Downer - BP clinic Your physician wants you to follow-up in: 12 months with Dr. Gwenlyn Found. You will receive a reminder letter in the mail two months in advance. If you don't receive a letter, please call our office to schedule the follow-up appointment.   Any Other Special Instructions Will Be Listed Below (If Applicable).  Dr. Gwenlyn Found would like you to check your blood pressure DAILY for the next 4 weeks.  Keep a journal of these daily BP and heart rate reading and call our office with the results.   If you need a refill on your cardiac medications before your next appointment, please call your pharmacy.

## 2015-04-24 ENCOUNTER — Telehealth: Payer: Self-pay | Admitting: Cardiovascular Disease

## 2015-04-24 ENCOUNTER — Encounter: Payer: Self-pay | Admitting: Cardiovascular Disease

## 2015-04-24 MED ORDER — LOSARTAN POTASSIUM 100 MG PO TABS
100.0000 mg | ORAL_TABLET | Freq: Every day | ORAL | Status: DC
Start: 2015-04-24 — End: 2016-07-17

## 2015-04-24 NOTE — Telephone Encounter (Signed)
Losartan sent to the correct pharmacy.

## 2015-04-24 NOTE — Telephone Encounter (Signed)
Pt saw [name] on Friday,her Losartan was supposed to have been called in.. She checked with pharmacist and it was not there.

## 2015-04-24 NOTE — Telephone Encounter (Signed)
Pt said she wanted her Losartan to go to [facility] in [place]-[phone].

## 2015-05-03 DIAGNOSIS — H44512 Absolute glaucoma, left eye: Secondary | ICD-10-CM | POA: Diagnosis not present

## 2015-05-08 ENCOUNTER — Encounter (HOSPITAL_COMMUNITY): Payer: Medicare Other

## 2015-05-15 ENCOUNTER — Ambulatory Visit (HOSPITAL_COMMUNITY): Payer: Medicare Other | Attending: Cardiology

## 2015-05-15 ENCOUNTER — Other Ambulatory Visit: Payer: Self-pay

## 2015-05-15 DIAGNOSIS — Z8679 Personal history of other diseases of the circulatory system: Secondary | ICD-10-CM

## 2015-05-15 DIAGNOSIS — E785 Hyperlipidemia, unspecified: Secondary | ICD-10-CM | POA: Diagnosis not present

## 2015-05-15 DIAGNOSIS — I352 Nonrheumatic aortic (valve) stenosis with insufficiency: Secondary | ICD-10-CM | POA: Insufficient documentation

## 2015-05-15 DIAGNOSIS — I340 Nonrheumatic mitral (valve) insufficiency: Secondary | ICD-10-CM | POA: Diagnosis not present

## 2015-05-15 DIAGNOSIS — I517 Cardiomegaly: Secondary | ICD-10-CM | POA: Diagnosis not present

## 2015-05-15 DIAGNOSIS — I071 Rheumatic tricuspid insufficiency: Secondary | ICD-10-CM | POA: Insufficient documentation

## 2015-05-15 DIAGNOSIS — I359 Nonrheumatic aortic valve disorder, unspecified: Secondary | ICD-10-CM | POA: Diagnosis not present

## 2015-05-15 DIAGNOSIS — F17200 Nicotine dependence, unspecified, uncomplicated: Secondary | ICD-10-CM | POA: Insufficient documentation

## 2015-05-15 DIAGNOSIS — I2510 Atherosclerotic heart disease of native coronary artery without angina pectoris: Secondary | ICD-10-CM | POA: Diagnosis present

## 2015-05-15 DIAGNOSIS — I10 Essential (primary) hypertension: Secondary | ICD-10-CM

## 2015-06-01 ENCOUNTER — Encounter: Payer: Self-pay | Admitting: Pharmacist Clinician (PhC)/ Clinical Pharmacy Specialist

## 2015-06-01 ENCOUNTER — Ambulatory Visit (INDEPENDENT_AMBULATORY_CARE_PROVIDER_SITE_OTHER): Payer: Medicare Other | Admitting: Pharmacist Clinician (PhC)/ Clinical Pharmacy Specialist

## 2015-06-01 ENCOUNTER — Ambulatory Visit (HOSPITAL_COMMUNITY)
Admission: RE | Admit: 2015-06-01 | Discharge: 2015-06-01 | Disposition: A | Discharge status: Home / Self Care | Payer: Medicare Other | Source: Ambulatory Visit | Attending: Cardiovascular Disease | Admitting: Cardiovascular Disease

## 2015-06-01 VITALS — BP 168/84 | HR 72 | Ht 62.0 in | Wt 152.2 lb

## 2015-06-01 DIAGNOSIS — Z8679 Personal history of other diseases of the circulatory system: Secondary | ICD-10-CM

## 2015-06-01 DIAGNOSIS — I10 Essential (primary) hypertension: Secondary | ICD-10-CM

## 2015-06-01 DIAGNOSIS — I6523 Occlusion and stenosis of bilateral carotid arteries: Secondary | ICD-10-CM | POA: Diagnosis not present

## 2015-06-01 DIAGNOSIS — F17200 Nicotine dependence, unspecified, uncomplicated: Secondary | ICD-10-CM | POA: Diagnosis not present

## 2015-06-01 DIAGNOSIS — E785 Hyperlipidemia, unspecified: Secondary | ICD-10-CM | POA: Diagnosis not present

## 2015-06-01 NOTE — Patient Instructions (Signed)
Return for a a follow up appointment in 1 month  Your blood pressure today is 168/84  (goal is <150/90)  Check your blood pressure at home daily and keep record of the readings.  Take your BP meds as follows: continue with all your current medications  Bring all of your meds, your BP cuff and your record of home blood pressures to your next appointment.  Exercise as you're able, try to walk approximately 30 minutes per day.  Keep salt intake to a minimum, especially watch canned and prepared boxed foods.  Eat more fresh fruits and vegetables and fewer canned items.  Avoid eating in fast food restaurants.    HOW TO TAKE YOUR BLOOD PRESSURE: . Rest 5 minutes before taking your blood pressure. .  Don't smoke or drink caffeinated beverages for at least 30 minutes before. . Take your blood pressure before (not after) you eat. . Sit comfortably with your back supported and both feet on the floor (don't cross your legs). . Elevate your arm to heart level on a table or a desk. . Use the proper sized cuff. It should fit smoothly and snugly around your bare upper arm. There should be enough room to slip a fingertip under the cuff. The bottom edge of the cuff should be 1 inch above the crease of the elbow. . Ideally, take 3 measurements at one sitting and record the average.

## 2015-06-01 NOTE — Progress Notes (Signed)
06/01/2015 Tammy Brooks 1933/07/22 [id number]   HPI:  Tammy Brooks is a 81 y.o. female patient of [name], with a [place] below who presents today for hypertension clinic evaluation.  She saw Dr. Gwenlyn Found in November and he switched her lisinopril 20 mg to losartan 100 mg and increased amlodipine from 5 to 10 mg.  She reports no problems with these changes today.  She tells me that she has had problems with hypertension since she was in her 58's although has been well controlled for much of this time.   Cardiac Hx: CAD, hypertension, hyperlipidemia, 1 ppd smoker, moderate right internal carotid stenosis  Family Hx: both parents, sister and 1 son with hypertension; mother died from aneurysm at 63, father after his 39rd MI at 58 (first was in his mid 54s)  Social Hx: smokes 1 ppd, occasional mixed drinks on weekends, 1 cup of coffee most mornings  Diet: adds some salt to foods while cooking, but none at table, does not eat out much  Exercise: nothing specific, states she is always moving around the house and yard, more limited recently with arthritis in hips  Home BP readings:  Patient has kept log book of readings at various times of day, diastolic and HR all WNL, one systolic reading at 010, all others <140, lowest at 109.  Did not bring her BP cuff to the office today  Current antihypertensive medications: losartan 100 mg, amlodipine 10 mg, metoprolol 25 mg bid  Wt Readings from Last 3 Encounters:  06/01/15 152 lb 3.2 oz (69.037 kg)  04/21/15 150 lb (68.04 kg)  03/28/15 149 lb (67.586 kg)   BP Readings from Last 3 Encounters:  06/01/15 168/84  04/21/15 172/82  03/28/15 151/75   Pulse Readings from Last 3 Encounters:  06/01/15 72  04/21/15 67  03/28/15 66    Current Outpatient Prescriptions  Medication Sig Dispense Refill  . allopurinol (ZYLOPRIM) 100 MG tablet Take 1 tablet (100 mg total) by mouth daily. 90 tablet 3  . amLODipine (NORVASC) 10 MG tablet Take 1 tablet (10 mg  total) by mouth daily. 90 tablet 3  . aspirin EC 81 MG tablet Take 81 mg by mouth daily.    . cholecalciferol (VITAMIN D) 1000 UNITS tablet Take 1 tablet (1,000 Units total) by mouth daily. 100 tablet 3  . colchicine 0.6 MG tablet Take 1 tablet (0.6 mg total) by mouth 4 (four) times daily as needed. 360 tablet 2  . dorzolamide-timolol (COSOPT) 22.3-6.8 MG/ML ophthalmic solution Place 1 drop into the left eye daily. (Patient taking differently: Place 1 drop into the left eye daily. 2-0.5% sol 10.0  One drop Left eye daily) 10 mL 3  . estradiol (ESTRACE) 0.5 MG tablet Take 1 tablet (0.5 mg total) by mouth daily. 90 tablet 2  . furosemide (LASIX) 20 MG tablet TAKE 1-2 TABLETS BY MOUTH  DAILY AS NEEDED FOR EDEMA. 180 tablet 1  . losartan (COZAAR) 100 MG tablet Take 1 tablet (100 mg total) by mouth daily. 90 tablet 3  . meloxicam (MOBIC) 15 MG tablet Take 1 tablet by mouth  daily as needed for pain 90 tablet 2  . metoprolol (LOPRESSOR) 50 MG tablet Take 0.5 tablets (25 mg total) by mouth 2 (two) times daily. 180 tablet 2  . naproxen sodium (ANAPROX) 220 MG tablet Take 220 mg by mouth as needed.    . Polyethylene Glycol 3350 POWD as needed.     . simvastatin (ZOCOR) 20  MG tablet Take 20 mg by mouth daily.    Marland Kitchen tiZANidine (ZANAFLEX) 4 MG tablet Take 1 tablet (4 mg total) by mouth every 8 (eight) hours as needed for muscle spasms. 90 tablet 2   No current facility-administered medications for this visit.    Allergies  Allergen Reactions  . Nabumetone Anaphylaxis and Other (See Comments)    Causes inflammation    Past Medical History  Diagnosis Date  . Arthritis   . Blind left eye   . Heart murmur   . Hyperlipidemia   . Hypertension   . Myocardial infarction [facility])     mild/ age 48  . Ulcer   . CAD (coronary artery disease) 08/1995    50-55% second diag dz.; occluded OM  . PAD (peripheral artery disease) (HCC)     mild to mod Rt ICA stenosis followed by doppler-02/05/12  . Echocardiogram  abnormal 03/2012    mild to mod AS, valve area1.58 cm2, mild concentric LVH, grade I diastolic dusfunction  . Tobacco abuse   . Abdominal aortic aneurysm ([place])     followed by Dr. Sherren Mocha Early  . Complication of anesthesia     "Blood pressure went down" with retinal surgery - no problems with previous surgeries  . Stroke [facility]) 2001    no residual problems  . Gout   . Pelvic mass     Blood pressure 168/84, pulse 72, height '5\' 2"'$  (1.575 m), weight 152 lb 3.2 oz (69.037 kg).    Tommy Medal PharmD CPP [facility]

## 2015-06-01 NOTE — Assessment & Plan Note (Signed)
Although her BP is elevated today in the office, all of her home readings had looked good.  She admits to more eating out, on the run, with the holidays, as well as stressed, with this being the first Christmas since one of her sons died in February 05, 2023.  I am not going to make any changes today, but have asked that she get back on track with eating right and staying active.  I will see her back in a month, at which time she will bring her home BP monitor.  At that time we may need to add some chlorthalidone.

## 2015-06-07 DIAGNOSIS — K432 Incisional hernia without obstruction or gangrene: Secondary | ICD-10-CM | POA: Diagnosis not present

## 2015-06-12 ENCOUNTER — Telehealth: Payer: Self-pay | Admitting: Cardiovascular Disease

## 2015-06-12 NOTE — Telephone Encounter (Signed)
Left message to call back  

## 2015-06-12 NOTE — Telephone Encounter (Signed)
Results & recommendations communicated to pt, who verbalized understanding.

## 2015-06-12 NOTE — Telephone Encounter (Signed)
Pt called in stating that Maudie Mercury called her wanting to give her the results to her Carotid doppler. Please f/u with her   Thanks

## 2015-06-20 ENCOUNTER — Other Ambulatory Visit: Payer: Self-pay | Admitting: Gynecology

## 2015-06-29 ENCOUNTER — Ambulatory Visit (INDEPENDENT_AMBULATORY_CARE_PROVIDER_SITE_OTHER): Payer: Medicare Other | Admitting: Pharmacist Clinician (PhC)/ Clinical Pharmacy Specialist

## 2015-06-29 ENCOUNTER — Encounter: Payer: Self-pay | Admitting: Pharmacist Clinician (PhC)/ Clinical Pharmacy Specialist

## 2015-06-29 VITALS — BP 187/91 | HR 58 | Ht 62.0 in | Wt 150.5 lb

## 2015-06-29 DIAGNOSIS — I10 Essential (primary) hypertension: Secondary | ICD-10-CM | POA: Diagnosis not present

## 2015-06-29 MED ORDER — CHLORTHALIDONE 25 MG PO TABS: ORAL_TABLET | ORAL | Status: DC

## 2015-06-29 NOTE — Patient Instructions (Signed)
Return for a a follow up appointment in 1 month  Your blood pressure today is 178/82  Check your blood pressure at home daily and keep record of the readings.  Take your BP meds as follows: add chlorthalidone 12.5 mg (1/2 tablet) daily for 4-5 days, then take daily if morning BP is >130 on the top number.   Switch amlodipine dose to evenings.  Bring all of your meds, your BP cuff and your record of home blood pressures to your next appointment.  Exercise as you're able, try to walk approximately 30 minutes per day.  Keep salt intake to a minimum, especially watch canned and prepared boxed foods.  Eat more fresh fruits and vegetables and fewer canned items.  Avoid eating in fast food restaurants.    HOW TO TAKE YOUR BLOOD PRESSURE: . Rest 5 minutes before taking your blood pressure. .  Don't smoke or drink caffeinated beverages for at least 30 minutes before. . Take your blood pressure before (not after) you eat. . Sit comfortably with your back supported and both feet on the floor (don't cross your legs). . Elevate your arm to heart level on a table or a desk. . Use the proper sized cuff. It should fit smoothly and snugly around your bare upper arm. There should be enough room to slip a fingertip under the cuff. The bottom edge of the cuff should be 1 inch above the crease of the elbow. . Ideally, take 3 measurements at one sitting and record the average.

## 2015-06-29 NOTE — Progress Notes (Signed)
06/29/2015 Tammy Brooks [birth year] [id number]   HPI:  Tammy Brooks is a 82 y.o. female patient of [name], with a [place] below who presents today for hypertension clinic follow up.  At her last visit with me we did not make any changes to her medications, as her home readings all looked good and she was dealing with the stress of Christmas after the recent death of her son.  Today she reports no problems with her medications, and brings in her cuff and list of readings.  The list only includes 5 readings, with one <409 systolic.  However when looking at the history on her cuff, many of the readings are > 150, but she states that multiple family members use the same cuff.  She has had problems with hypertension since she was in her 85's although has been well controlled for much of this time.   Cardiac Hx: CAD, hypertension, hyperlipidemia, 1 ppd smoker, moderate right internal carotid stenosis  Family Hx: both parents, sister and 1 son with hypertension; mother died from aneurysm at 37, father after his 24rd MI at 77 (first was in his mid 72s)  Social Hx: smokes 1 ppd, occasional mixed drinks on weekends, 1 cup of coffee most mornings  Diet: adds some salt to foods while cooking, but none at table, does not eat out much; eating more chicken and beans, cereal daily for breakfast  Exercise: nothing specific, states she is always moving around the house and yard, more limited recently with arthritis in hips  Home BP readings:  Patient has kept log book of readings at various times of day, diastolic and HR all WNL, one systolic reading at 735, all others <140.  Only 5 readings listed  Current antihypertensive medications: losartan 100 mg, amlodipine 10 mg, metoprolol 25 mg bid  Wt Readings from Last 3 Encounters:  06/29/15 150 lb 8 oz (68.266 kg)  06/01/15 152 lb 3.2 oz (69.037 kg)  04/21/15 150 lb (68.04 kg)   BP Readings from Last 3 Encounters:  06/29/15 187/91  06/01/15 168/84  04/21/15  172/82   Pulse Readings from Last 3 Encounters:  06/29/15 58  06/01/15 72  04/21/15 67    Current Outpatient Prescriptions  Medication Sig Dispense Refill  . atorvastatin (LIPITOR) 20 MG tablet Take 20 mg by mouth daily.    Marland Kitchen allopurinol (ZYLOPRIM) 100 MG tablet Take 1 tablet (100 mg total) by mouth daily. 90 tablet 3  . amLODipine (NORVASC) 10 MG tablet Take 1 tablet (10 mg total) by mouth daily. 90 tablet 3  . aspirin EC 81 MG tablet Take 81 mg by mouth daily.    . colchicine 0.6 MG tablet Take 1 tablet (0.6 mg total) by mouth 4 (four) times daily as needed. 360 tablet 2  . dorzolamide-timolol (COSOPT) 22.3-6.8 MG/ML ophthalmic solution Place 1 drop into the left eye daily. (Patient taking differently: Place 1 drop into the left eye daily. 2-0.5% sol 10.0  One drop Left eye daily) 10 mL 3  . estradiol (ESTRACE) 0.5 MG tablet TAKE 1 TABLET BY MOUTH EVERY DAY 90 tablet 0  . furosemide (LASIX) 20 MG tablet TAKE 1-2 TABLETS BY MOUTH  DAILY AS NEEDED FOR EDEMA. 180 tablet 1  . losartan (COZAAR) 100 MG tablet Take 1 tablet (100 mg total) by mouth daily. 90 tablet 3  . meloxicam (MOBIC) 15 MG tablet Take 1 tablet by mouth  daily as needed for pain 90 tablet 2  .  metoprolol (LOPRESSOR) 50 MG tablet Take 0.5 tablets (25 mg total) by mouth 2 (two) times daily. 180 tablet 2  . naproxen sodium (ANAPROX) 220 MG tablet Take 220 mg by mouth as needed.    . Polyethylene Glycol 3350 POWD as needed.     Marland Kitchen tiZANidine (ZANAFLEX) 4 MG tablet Take 1 tablet (4 mg total) by mouth every 8 (eight) hours as needed for muscle spasms. 90 tablet 2   No current facility-administered medications for this visit.    Allergies  Allergen Reactions  . Nabumetone Anaphylaxis and Other (See Comments)    Causes inflammation    Past Medical History  Diagnosis Date  . Arthritis   . Blind left eye   . Heart murmur   . Hyperlipidemia   . Hypertension   . Myocardial infarction [facility])     mild/ age 72  . Ulcer   .  CAD (coronary artery disease) 08/1995    50-55% second diag dz.; occluded OM  . PAD (peripheral artery disease) (HCC)     mild to mod Rt ICA stenosis followed by doppler-02/05/12  . Echocardiogram abnormal 03/2012    mild to mod AS, valve area1.58 cm2, mild concentric LVH, grade I diastolic dusfunction  . Tobacco abuse   . Abdominal aortic aneurysm ([place])     followed by Dr. Sherren Mocha Early  . Complication of anesthesia     "Blood pressure went down" with retinal surgery - no problems with previous surgeries  . Stroke [facility]) 2001    no residual problems  . Gout   . Pelvic mass     Blood pressure 187/91, pulse 58, height '5\' 2"'$  (1.575 m), weight 150 lb 8 oz (68.266 kg).    Tommy Medal PharmD CPP [facility]

## 2015-06-29 NOTE — Assessment & Plan Note (Signed)
Again today, her office BP reading does not correlate with her home readings.  She is a smoker and admits to having a cigarette in the past 15-20 minutes.  She is not interested in quitting, although her family would seemingly like that.  I have explained to her that her early morning BP readings may all be good, but she apparently does not stay controlled throughout the day.  She is concerned about her BP going too low if we add any more medication and I have assured her that this is not likely to happen.  I am going to add chlorthalidone 12.5 mg daily, and to help ease her worries, I told her not to take it if the morning systolic reading is < 948.  I did stress the need to take her BP each morning and record it on the log sheet.  She should also take her pressure at some later point in the day, 3-4 times per week and record that as well.  I will see her back in 1 month for follow up.

## 2015-07-18 ENCOUNTER — Other Ambulatory Visit: Payer: Self-pay | Admitting: Internal Medicine

## 2015-07-27 ENCOUNTER — Ambulatory Visit: Payer: Medicare Other | Admitting: Pharmacist Clinician (PhC)/ Clinical Pharmacy Specialist

## 2015-08-03 ENCOUNTER — Ambulatory Visit (INDEPENDENT_AMBULATORY_CARE_PROVIDER_SITE_OTHER): Payer: Medicare Other | Admitting: Pharmacist Clinician (PhC)/ Clinical Pharmacy Specialist

## 2015-08-03 ENCOUNTER — Encounter: Payer: Self-pay | Admitting: Pharmacist Clinician (PhC)/ Clinical Pharmacy Specialist

## 2015-08-03 VITALS — BP 196/98 | HR 60 | Ht 62.0 in | Wt 156.4 lb

## 2015-08-03 DIAGNOSIS — I10 Essential (primary) hypertension: Secondary | ICD-10-CM

## 2015-08-03 NOTE — Assessment & Plan Note (Addendum)
Although her BP is elevated in the office, her home readings are much improved.  She brought both home cuffs in today and both read within 5 points of the office reading.  She was worried about low blood pressures, but I assured her that she didn't have any readings of concern, but she could hold her BP meds if her systolic reading is below 100.  She will continue with regular BP monitoring and contact us should she have any concerns.

## 2015-08-03 NOTE — Progress Notes (Signed)
08/03/2015 KHAMILLE BEYNON 1933/11/26 [id number]   HPI:  Tammy Brooks is a 82 y.o. female patient of [name], with a [place] below who presents today for hypertension clinic follow up.  At her last visit we added chlorthalidone 12.5 mg daily and since then her home BP readings have been more stable.  She had no problems with the addition of chlorthalidone.      Cardiac Hx: CAD, hypertension, hyperlipidemia, 1 ppd smoker, moderate right internal carotid stenosis  Family Hx: both parents, sister and 1 son with hypertension; mother died from aneurysm at 3, father after his 51rd MI at 3 (first was in his mid 74s)  Social Hx: smokes 1 ppd, occasional mixed drinks on weekends, 1 cup of coffee most mornings  Diet: adds some salt to foods while cooking, but none at table, does not eat out much; eating more chicken and beans, cereal daily for breakfast  Exercise: nothing specific, states she is always moving around the house and yard, more limited recently with arthritis in hips  Home BP readings:  Of 50 readings, only 5 were > 150 and 4 < 120.  All diastolic readings WNL  Current antihypertensive medications: losartan 100 mg, amlodipine 10 mg, metoprolol 25 mg bid  Wt Readings from Last 3 Encounters:  08/03/15 156 lb 6.4 oz (70.943 kg)  06/29/15 150 lb 8 oz (68.266 kg)  06/01/15 152 lb 3.2 oz (69.037 kg)   BP Readings from Last 3 Encounters:  08/03/15 196/98  06/29/15 187/91  06/01/15 168/84   Pulse Readings from Last 3 Encounters:  08/03/15 60  06/29/15 58  06/01/15 72    Current Outpatient Prescriptions  Medication Sig Dispense Refill  . allopurinol (ZYLOPRIM) 100 MG tablet Take 1 tablet by mouth  daily 90 tablet 1  . amLODipine (NORVASC) 10 MG tablet Take 1 tablet (10 mg total) by mouth daily. 90 tablet 3  . aspirin EC 81 MG tablet Take 81 mg by mouth daily.    Marland Kitchen atorvastatin (LIPITOR) 20 MG tablet Take 20 mg by mouth daily.    . chlorthalidone (HYGROTON) 25 MG tablet Take 1/2  tablet by mouth daily as directed 15 tablet 3  . colchicine 0.6 MG tablet Take 1 tablet (0.6 mg total) by mouth 4 (four) times daily as needed. 360 tablet 2  . dorzolamide-timolol (COSOPT) 22.3-6.8 MG/ML ophthalmic solution Place 1 drop into the left eye daily. (Patient taking differently: Place 1 drop into the left eye daily. 2-0.5% sol 10.0  One drop Left eye daily) 10 mL 3  . estradiol (ESTRACE) 0.5 MG tablet TAKE 1 TABLET BY MOUTH EVERY DAY 90 tablet 0  . furosemide (LASIX) 20 MG tablet TAKE 1-2 TABLETS BY MOUTH  DAILY AS NEEDED FOR EDEMA. 180 tablet 1  . losartan (COZAAR) 100 MG tablet Take 1 tablet (100 mg total) by mouth daily. 90 tablet 3  . meloxicam (MOBIC) 15 MG tablet Take 1 tablet by mouth  daily as needed for pain 90 tablet 2  . metoprolol (LOPRESSOR) 50 MG tablet Take 0.5 tablets (25 mg total) by mouth 2 (two) times daily. 180 tablet 2  . naproxen sodium (ANAPROX) 220 MG tablet Take 220 mg by mouth as needed.    . Polyethylene Glycol 3350 POWD as needed.     Marland Kitchen tiZANidine (ZANAFLEX) 4 MG tablet Take 1 tablet (4 mg total) by mouth every 8 (eight) hours as needed for muscle spasms. 90 tablet 2   No current  facility-administered medications for this visit.    Allergies  Allergen Reactions  . Nabumetone Anaphylaxis and Other (See Comments)    Causes inflammation    Past Medical History  Diagnosis Date  . Arthritis   . Blind left eye   . Heart murmur   . Hyperlipidemia   . Hypertension   . Myocardial infarction [facility])     mild/ age 58  . Ulcer   . CAD (coronary artery disease) 08/1995    50-55% second diag dz.; occluded OM  . PAD (peripheral artery disease) (HCC)     mild to mod Rt ICA stenosis followed by doppler-02/05/12  . Echocardiogram abnormal 03/2012    mild to mod AS, valve area1.58 cm2, mild concentric LVH, grade I diastolic dusfunction  . Tobacco abuse   . Abdominal aortic aneurysm ([place])     followed by Dr. Sherren Mocha Early  . Complication of anesthesia     "Blood  pressure went down" with retinal surgery - no problems with previous surgeries  . Stroke [facility]) 2001    no residual problems  . Gout   . Pelvic mass     Blood pressure 196/98, pulse 60, height '5\' 2"'$  (1.575 m), weight 156 lb 6.4 oz (70.943 kg).    Tommy Medal PharmD CPP [facility]

## 2015-08-03 NOTE — Patient Instructions (Signed)
  Your blood pressure today is 198/96  Check your blood pressure at home several days each week and keep record of the readings.  Take your BP meds as follows: continue with all your current medications  DO NOT TAKE YOUR BLOOD PRESSURE MEDICINE FOR THE DAY IF YOUR PRESSURE IS <100 ON THE TOP NUMBER  Bring all of your meds, your BP cuff and your record of home blood pressures to your next appointment.  Exercise as you're able, try to walk approximately 30 minutes per day.  Keep salt intake to a minimum, especially watch canned and prepared boxed foods.  Eat more fresh fruits and vegetables and fewer canned items.  Avoid eating in fast food restaurants.    HOW TO TAKE YOUR BLOOD PRESSURE: . Rest 5 minutes before taking your blood pressure. .  Don't smoke or drink caffeinated beverages for at least 30 minutes before. . Take your blood pressure before (not after) you eat. . Sit comfortably with your back supported and both feet on the floor (don't cross your legs). . Elevate your arm to heart level on a table or a desk. . Use the proper sized cuff. It should fit smoothly and snugly around your bare upper arm. There should be enough room to slip a fingertip under the cuff. The bottom edge of the cuff should be 1 inch above the crease of the elbow. . Ideally, take 3 measurements at one sitting and record the average.

## 2015-09-28 ENCOUNTER — Other Ambulatory Visit: Payer: Self-pay | Admitting: Gynecologic Oncology

## 2015-09-29 ENCOUNTER — Encounter: Payer: Self-pay | Admitting: Gynecology

## 2015-09-29 ENCOUNTER — Other Ambulatory Visit (HOSPITAL_BASED_OUTPATIENT_CLINIC_OR_DEPARTMENT_OTHER): Payer: Medicare Other

## 2015-09-29 ENCOUNTER — Ambulatory Visit: Payer: Medicare Other | Attending: Gynecology | Admitting: Gynecology

## 2015-09-29 VITALS — BP 141/60 | HR 63 | Temp 98.3°F | Resp 17 | Ht 62.0 in | Wt 158.5 lb

## 2015-09-29 DIAGNOSIS — I252 Old myocardial infarction: Secondary | ICD-10-CM | POA: Insufficient documentation

## 2015-09-29 DIAGNOSIS — D3911 Neoplasm of uncertain behavior of right ovary: Secondary | ICD-10-CM | POA: Diagnosis not present

## 2015-09-29 DIAGNOSIS — I2510 Atherosclerotic heart disease of native coronary artery without angina pectoris: Secondary | ICD-10-CM | POA: Insufficient documentation

## 2015-09-29 DIAGNOSIS — Z8249 Family history of ischemic heart disease and other diseases of the circulatory system: Secondary | ICD-10-CM | POA: Diagnosis not present

## 2015-09-29 DIAGNOSIS — Z8673 Personal history of transient ischemic attack (TIA), and cerebral infarction without residual deficits: Secondary | ICD-10-CM | POA: Insufficient documentation

## 2015-09-29 DIAGNOSIS — Z720 Tobacco use: Secondary | ICD-10-CM | POA: Diagnosis not present

## 2015-09-29 DIAGNOSIS — Z79899 Other long term (current) drug therapy: Secondary | ICD-10-CM | POA: Insufficient documentation

## 2015-09-29 DIAGNOSIS — M109 Gout, unspecified: Secondary | ICD-10-CM | POA: Insufficient documentation

## 2015-09-29 DIAGNOSIS — K439 Ventral hernia without obstruction or gangrene: Secondary | ICD-10-CM | POA: Insufficient documentation

## 2015-09-29 DIAGNOSIS — Z9889 Other specified postprocedural states: Secondary | ICD-10-CM | POA: Insufficient documentation

## 2015-09-29 DIAGNOSIS — F17210 Nicotine dependence, cigarettes, uncomplicated: Secondary | ICD-10-CM | POA: Diagnosis not present

## 2015-09-29 DIAGNOSIS — Z888 Allergy status to other drugs, medicaments and biological substances status: Secondary | ICD-10-CM | POA: Insufficient documentation

## 2015-09-29 DIAGNOSIS — Z9851 Tubal ligation status: Secondary | ICD-10-CM | POA: Diagnosis not present

## 2015-09-29 DIAGNOSIS — K432 Incisional hernia without obstruction or gangrene: Secondary | ICD-10-CM | POA: Diagnosis not present

## 2015-09-29 DIAGNOSIS — M1990 Unspecified osteoarthritis, unspecified site: Secondary | ICD-10-CM | POA: Diagnosis not present

## 2015-09-29 DIAGNOSIS — Z7982 Long term (current) use of aspirin: Secondary | ICD-10-CM | POA: Diagnosis not present

## 2015-09-29 DIAGNOSIS — I10 Essential (primary) hypertension: Secondary | ICD-10-CM | POA: Diagnosis not present

## 2015-09-29 DIAGNOSIS — E785 Hyperlipidemia, unspecified: Secondary | ICD-10-CM | POA: Diagnosis not present

## 2015-09-29 NOTE — Patient Instructions (Signed)
We will contact you with the results of your labwork from today (Inhibin B and Anti-mullerian hormone).  Plan to follow up in six months or sooner if needed.  Please follow up with smoking cessation resources provided by the [facility].  Smoking Cessation, Tips for Success  If you are ready to quit smoking, congratulations! You have chosen to help yourself be healthier. Cigarettes bring nicotine, tar, carbon monoxide, and other irritants into your body. Your lungs, heart, and blood vessels will be able to work better without these poisons. There are many different ways to quit smoking. Nicotine gum, nicotine patches, a nicotine inhaler, or nicotine nasal spray can help with physical craving. Hypnosis, support groups, and medicines help break the habit of smoking. WHAT THINGS CAN I DO TO MAKE QUITTING EASIER?  Here are some tips to help you quit for good:  Pick a date when you will quit smoking completely. Tell all of your friends and family about your plan to quit on that date.  Do not try to slowly cut down on the number of cigarettes you are smoking. Pick a quit date and quit smoking completely starting on that day.  Throw away all cigarettes.   Clean and remove all ashtrays from your home, work, and car.  On a card, write down your reasons for quitting. Carry the card with you and read it when you get the urge to smoke.  Cleanse your body of nicotine. Drink enough water and fluids to keep your urine clear or pale yellow. Do this after quitting to flush the nicotine from your body.  Learn to predict your moods. Do not let a bad situation be your excuse to have a cigarette. Some situations in your life might tempt you into wanting a cigarette.  Never have "just one" cigarette. It leads to wanting another and another. Remind yourself of your decision to quit.  Change habits associated with smoking. If you smoked while driving or when feeling stressed, try other activities to replace  smoking. Stand up when drinking your coffee. Brush your teeth after eating. Sit in a different chair when you read the paper. Avoid alcohol while trying to quit, and try to drink fewer caffeinated beverages. Alcohol and caffeine may urge you to smoke.  Avoid foods and drinks that can trigger a desire to smoke, such as sugary or spicy foods and alcohol.  Ask people who smoke not to smoke around you.  Have something planned to do right after eating or having a cup of coffee. For example, plan to take a walk or exercise.  Try a relaxation exercise to calm you down and decrease your stress. Remember, you may be tense and nervous for the first 2 weeks after you quit, but this will pass.  Find new activities to keep your hands busy. Play with a pen, coin, or rubber band. Doodle or draw things on paper.  Brush your teeth right after eating. This will help cut down on the craving for the taste of tobacco after meals. You can also try mouthwash.   Use oral substitutes in place of cigarettes. Try using lemon drops, carrots, cinnamon sticks, or chewing gum. Keep them handy so they are available when you have the urge to smoke.  When you have the urge to smoke, try deep breathing.  Designate your home as a nonsmoking area.  If you are a heavy smoker, ask your health care provider about a prescription for nicotine chewing gum. It can ease your withdrawal  from nicotine.  Reward yourself. Set aside the cigarette money you save and buy yourself something nice.  Look for support from others. Join a support group or smoking cessation program. Ask someone at home or at work to help you with your plan to quit smoking.  Always ask yourself, "Do I need this cigarette or is this just a reflex?" Tell yourself, "Today, I choose not to smoke," or "I do not want to smoke." You are reminding yourself of your decision to quit.  Do not replace cigarette smoking with electronic cigarettes (commonly called  e-cigarettes). The safety of e-cigarettes is unknown, and some may contain harmful chemicals.  If you relapse, do not give up! Plan ahead and think about what you will do the next time you get the urge to smoke. HOW WILL I FEEL WHEN I QUIT SMOKING? You may have symptoms of withdrawal because your body is used to nicotine (the addictive substance in cigarettes). You may crave cigarettes, be irritable, feel very hungry, cough often, get headaches, or have difficulty concentrating. The withdrawal symptoms are only temporary. They are strongest when you first quit but will go away within 10-14 days. When withdrawal symptoms occur, stay in control. Think about your reasons for quitting. Remind yourself that these are signs that your body is healing and getting used to being without cigarettes. Remember that withdrawal symptoms are easier to treat than the major diseases that smoking can cause.  Even after the withdrawal is over, expect periodic urges to smoke. However, these cravings are generally short lived and will go away whether you smoke or not. Do not smoke! WHAT RESOURCES ARE AVAILABLE TO HELP ME QUIT SMOKING? Your health care provider can direct you to community resources or hospitals for support, which may include:  Group support.  Education.  Hypnosis.  Therapy.   This information is not intended to replace advice given to you by your health care provider. Make sure you discuss any questions you have with your health care provider.   Document Released: 02/16/2004 Document Revised: 06/10/2014 Document Reviewed: 11/05/2012 Elsevier Interactive Patient Education [employer].

## 2015-09-29 NOTE — Progress Notes (Signed)
Consult Note: Gyn-Onc   Tammy Brooks 82 y.o. female  Chief Complaint  Patient presents with  . 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 Ventral hernia at the lower pole of her incision.  Plan: Inhibin B and AMH are obtained today.. She return to see me in 6 months and we will repeat tumor markers at each visit.   She is encouraged to attend the smoking cessation classes. Apparently a general surgery will not operate on her well she is still smoking.  We will discontinue the use of oral estrogen and see how the patient does. (Recall that once her granulosa cell tumor was removed she had hot flushes and night sweats)  Interval history: Patient returns today as previously scheduled. Overall she's doing well as she notes that her hernia is getting larger. She has seen a general surgeon who encouraged her to discontinue smoking before considering repair of the ventral hernia. She denies any pain or other symptoms associated with the hernia. Overall her functional status is good.. She denies any GI or GU symptoms and has no pelvic pain or pressure. HPI:  82 year old white female seen in consultation at the request of Dr. Aloha Gell for evaluation of a newly diagnosed complex right pelvic mass.  The patient underwent a CT scan to assess an abdominal aortic aneurysm on 03/07/2014. An incidental finding was made of a 9 cm solid and cystic right adnexal mass. CT did not show any evidence of adenopathy, ascites, or any other evidence of metastatic disease. CA-125 value is 8.1 units per mL. The patient has no past gynecologic history except for undergoing a vaginal hysterectomy and previous tubal ligation. She denies any family history of ovarian cancer.  Patient denies any pelvic pain pressure or any other GI or GU symptoms.  She does have significant medical comorbidities especially vascular. The aortic aneurysm measures 4.1 x 4.3 cm with extensive mural  thrombus throughout the aneurysm.  Patient underwent exploratory laparotomy and bilateral salpingo-oophorectomy on 04/26/2014. 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 She had an uncomplicated  postoperative course.  The patient was followed with serial inhibin B and AMH.  Review of Systems:10 point review of systems is negative except as noted in interval history.   Vitals: Blood pressure 141/60, pulse 63, temperature 98.3 F (36.8 C), temperature source Oral, resp. rate 17, height '5\' 2"'$  (1.575 m), weight 158 lb 8 oz (71.895 kg), SpO2 98 %.  Physical Exam: General : The patient is a healthy woman in no acute distress.  HEENT: normocephalic, extraoccular movements normal; neck is supple without thyromegally  Lynphnodes: Supraclavicular and inguinal nodes not enlarged  Abdomen: Soft, non-tender, no ascites, no organomegally, no masses, no hernias, midline incision is well-healed. There is an easily reducible ventral hernia the lower aspect of the midline incision. Pelvic:  EGBUS: Normal female  Vagina: Normal, no lesions  Urethra and Bladder: Normal, non-tender  Cervix: Surgically absent  Uterus: Surgically absent  Bi-manual examination: No masses  Rectal: normal sphincter tone, no masses, no blood  Lower extremities: No edema or varicosities. Normal range of motion      Allergies  Allergen Reactions  . Nabumetone Anaphylaxis and Other (See Comments)    Causes inflammation    Past Medical History  Diagnosis Date  . Arthritis   . Blind left eye   . Heart murmur   . Hyperlipidemia   . Hypertension   .  Myocardial infarction [facility])     mild/ age 42  . Ulcer   . CAD (coronary artery disease) 08/1995    50-55% second diag dz.; occluded OM  . PAD (peripheral artery disease) ([facility])     mild to mod Rt ICA stenosis followed by doppler-02/05/12  . Echocardiogram abnormal 03/2012    mild to mod AS, valve area1.58 cm2, mild concentric LVH, grade I diastolic  dusfunction  . Tobacco abuse   . Abdominal aortic aneurysm ([facility])     followed by Dr. Sherren Mocha Early  . Complication of anesthesia     "Blood pressure went down" with retinal surgery - no problems with previous surgeries  . Stroke [facility]) 2001    no residual problems  . Gout   . Pelvic mass     Past Surgical History  Procedure Laterality Date  . Cataract extraction w/ intraocular lens  implant, bilateral    . Eye surgery      lense implant removed/ blind in left eye  . Upper gastrointestinal endoscopy      treated for h-pylori  . Colonoscopy    . Polypectomy    . Tubal ligation    . Appendectomy    . Tonsillectomy and adenoidectomy    . Abdominal hysterectomy    . Cardiac catheterization  08/1995    50-55% second diag lesion, occluded OM  . Retinal detachment surgery  2006  . Laparotomy N/A 04/26/2014    Procedure: EXPLORATORY LAPAROTOMY/RESECTION OF PELVIC MASS;  Surgeon: Alvino Chapel, MD;  Location: [facility] ORS;  Service: Gynecology;  Laterality: N/A;    Current Outpatient Prescriptions  Medication Sig Dispense Refill  . amLODipine (NORVASC) 10 MG tablet Take 1 tablet (10 mg total) by mouth daily. 90 tablet 3  . aspirin EC 81 MG tablet Take 81 mg by mouth daily.    Marland Kitchen atorvastatin (LIPITOR) 20 MG tablet Take 20 mg by mouth daily.    . chlorthalidone (HYGROTON) 25 MG tablet Take 1/2 tablet by mouth daily as directed 15 tablet 3  . colchicine 0.6 MG tablet Take 1 tablet (0.6 mg total) by mouth 4 (four) times daily as needed. 360 tablet 2  . dorzolamide-timolol (COSOPT) 22.3-6.8 MG/ML ophthalmic solution Place 1 drop into the left eye daily. (Patient taking differently: Place 1 drop into the left eye daily. 2-0.5% sol 10.0  One drop Left eye daily) 10 mL 3  . estradiol (ESTRACE) 0.5 MG tablet TAKE 1 TABLET BY MOUTH EVERY DAY 90 tablet 0  . furosemide (LASIX) 20 MG tablet TAKE 1-2 TABLETS BY MOUTH  DAILY AS NEEDED FOR EDEMA. 180 tablet 1  . losartan (COZAAR) 100 MG tablet Take 1  tablet (100 mg total) by mouth daily. 90 tablet 3  . meloxicam (MOBIC) 15 MG tablet Take 1 tablet by mouth  daily as needed for pain 90 tablet 2  . metoprolol (LOPRESSOR) 50 MG tablet Take 0.5 tablets (25 mg total) by mouth 2 (two) times daily. 180 tablet 2  . naproxen sodium (ANAPROX) 220 MG tablet Take 220 mg by mouth as needed.    . Polyethylene Glycol 3350 POWD as needed.     Marland Kitchen tiZANidine (ZANAFLEX) 4 MG tablet Take 1 tablet (4 mg total) by mouth every 8 (eight) hours as needed for muscle spasms. 90 tablet 2  . allopurinol (ZYLOPRIM) 100 MG tablet Take 1 tablet by mouth  daily 90 tablet 1   No current facility-administered medications for this visit.    Social History  Social History  . Marital Status: Widowed    Spouse Name: N/A  . Number of Children: 5  . Years of Education: 9   Occupational History  . Oncologist     retired   Social History Main Topics  . Smoking status: Current Every Day Smoker -- 1.00 packs/day for 62 years    Types: Cigarettes  . Smokeless tobacco: Never Used  . Alcohol Use: 0.0 oz/week    0 Standard drinks or equivalent per week     Comment: occasionally - few drinks/month  . Drug Use: No  . Sexual Activity: Not Currently   Other Topics Concern  . Not on file   Social History Narrative   9th grade. married '54- widowed Dec 10th'08. 4 sons - '55, '58, '60, '69; 1 daughter - '63; 12 grandchildren; 9 great-grandchildren. 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 Other children are close and attentive. ACP/End-of-life: No CPR, no mechanical ventilation, no futile/heroic measures.    Family History  Problem Relation Age of Onset  . Aneurysm Mother     AAA  . Hypertension Mother   . Hyperlipidemia Mother   . Heart disease Father     CAD, MI x 3  . Heart attack Father   . Cancer Maternal Aunt     breast cancer  . Diabetes Sister   . Hyperlipidemia Sister   . Hypertension Sister        Alvino Chapel, MD 09/29/2015, 1:27 PM

## 2015-10-02 LAB — ANTI MULLERIAN HORMONE: ANTI-MULLERIAN HORMONE (AMH): <0.015 ng/mL

## 2015-10-03 LAB — INHIBIN B

## 2015-10-04 ENCOUNTER — Telehealth: Payer: Self-pay

## 2015-10-04 NOTE — Telephone Encounter (Signed)
Orders received from [facility] to contact the Tammy Brooks with the results of her [facility]) Anti-Mullerian Hormone (<0.015) , Inhibin B (7.0) were both within normal range. Tammy Brooks contacted and updated with tumor markers being WNL, Tammy Brooks states understanding , denies further questions at this time.

## 2015-10-09 ENCOUNTER — Encounter: Payer: Self-pay | Admitting: Gastroenterology

## 2015-10-12 ENCOUNTER — Encounter: Payer: Self-pay | Admitting: Family

## 2015-10-17 ENCOUNTER — Ambulatory Visit: Payer: Medicare Other | Admitting: Family

## 2015-10-17 ENCOUNTER — Ambulatory Visit (HOSPITAL_COMMUNITY): Admission: RE | Admit: 2015-10-17 | Payer: Medicare Other | Source: Ambulatory Visit

## 2015-10-27 ENCOUNTER — Other Ambulatory Visit: Payer: Self-pay | Admitting: Cardiovascular Disease

## 2015-10-27 NOTE — Telephone Encounter (Signed)
REFILL 

## 2015-12-01 ENCOUNTER — Encounter: Payer: Self-pay | Admitting: Family

## 2015-12-05 ENCOUNTER — Other Ambulatory Visit: Payer: Self-pay | Admitting: Internal Medicine

## 2015-12-08 ENCOUNTER — Ambulatory Visit (HOSPITAL_COMMUNITY)
Admission: RE | Admit: 2015-12-08 | Discharge: 2015-12-08 | Disposition: A | Discharge status: Home / Self Care | Payer: Medicare Other | Source: Ambulatory Visit | Attending: Vascular Surgery | Admitting: Vascular Surgery

## 2015-12-08 DIAGNOSIS — Z720 Tobacco use: Secondary | ICD-10-CM | POA: Insufficient documentation

## 2015-12-08 DIAGNOSIS — Z8249 Family history of ischemic heart disease and other diseases of the circulatory system: Secondary | ICD-10-CM | POA: Diagnosis not present

## 2015-12-08 DIAGNOSIS — I714 Abdominal aortic aneurysm, without rupture, unspecified: Secondary | ICD-10-CM

## 2015-12-08 DIAGNOSIS — F17200 Nicotine dependence, unspecified, uncomplicated: Secondary | ICD-10-CM

## 2015-12-12 ENCOUNTER — Encounter: Payer: Self-pay | Admitting: Family

## 2015-12-12 ENCOUNTER — Ambulatory Visit (INDEPENDENT_AMBULATORY_CARE_PROVIDER_SITE_OTHER): Payer: Medicare Other | Admitting: Family

## 2015-12-12 VITALS — BP 144/72 | HR 85 | Temp 97.9°F | Resp 20 | Ht 63.0 in | Wt 155.0 lb

## 2015-12-12 DIAGNOSIS — Z720 Tobacco use: Secondary | ICD-10-CM

## 2015-12-12 DIAGNOSIS — I714 Abdominal aortic aneurysm, without rupture, unspecified: Secondary | ICD-10-CM

## 2015-12-12 DIAGNOSIS — Z8249 Family history of ischemic heart disease and other diseases of the circulatory system: Secondary | ICD-10-CM

## 2015-12-12 DIAGNOSIS — F17200 Nicotine dependence, unspecified, uncomplicated: Secondary | ICD-10-CM

## 2015-12-12 NOTE — Patient Instructions (Addendum)
Abdominal Aortic Aneurysm An aneurysm is a weakened or damaged part of an artery wall that bulges from the normal force of blood pumping through the body. An abdominal aortic aneurysm is an aneurysm that occurs in the lower part of the aorta, the main artery of the body.  The major concern with an abdominal aortic aneurysm is that it can enlarge and burst (rupture) or blood can flow between the layers of the wall of the aorta through a tear (aorticdissection). Both of these conditions can cause bleeding inside the body and can be life threatening unless diagnosed and treated promptly. CAUSES  The exact cause of an abdominal aortic aneurysm is unknown. Some contributing factors are:   A hardening of the arteries caused by the buildup of fat and other substances in the lining of a blood vessel (arteriosclerosis).  Inflammation of the walls of an artery (arteritis).   Connective tissue diseases, such as Marfan syndrome.   Abdominal trauma.   An infection, such as syphilis or staphylococcus, in the wall of the aorta (infectious aortitis) caused by bacteria. RISK FACTORS  Risk factors that contribute to an abdominal aortic aneurysm may include:  Age older than 60 years.   High blood pressure (hypertension).  Female gender.  Ethnicity (white race).  Obesity.  Family history of aneurysm (first degree relatives only).  Tobacco use. PREVENTION  The following healthy lifestyle habits may help decrease your risk of abdominal aortic aneurysm:  Quitting smoking. Smoking can raise your blood pressure and cause arteriosclerosis.  Limiting or avoiding alcohol.  Keeping your blood pressure, blood sugar level, and cholesterol levels within normal limits.  Decreasing your salt intake. In somepeople, too much salt can raise blood pressure and increase your risk of abdominal aortic aneurysm.  Eating a diet low in saturated fats and cholesterol.  Increasing your fiber intake by including  whole grains, vegetables, and fruits in your diet. Eating these foods may help lower blood pressure.  Maintaining a healthy weight.  Staying physically active and exercising regularly. SYMPTOMS  The symptoms of abdominal aortic aneurysm may vary depending on the size and rate of growth of the aneurysm.Most grow slowly and do not have any symptoms. When symptoms do occur, they may include:  Pain (abdomen, side, lower back, or groin). The pain may vary in intensity. A sudden onset of severe pain may indicate that the aneurysm has ruptured.  Feeling full after eating only small amounts of food.  Nausea or vomiting or both.  Feeling a pulsating lump in the abdomen.  Feeling faint or passing out. DIAGNOSIS  Since most unruptured abdominal aortic aneurysms have no symptoms, they are often discovered during diagnostic exams for other conditions. An aneurysm may be found during the following procedures:  Ultrasonography (A one-time screening for abdominal aortic aneurysm by ultrasonography is also recommended for all men aged 65-75 years who have ever smoked).  X-ray exams.  A computed tomography (CT).  Magnetic resonance imaging (MRI).  Angiography or arteriography. TREATMENT  Treatment of an abdominal aortic aneurysm depends on the size of your aneurysm, your age, and risk factors for rupture. Medication to control blood pressure and pain may be used to manage aneurysms smaller than 6 cm. Regular monitoring for enlargement may be recommended by your caregiver if:  The aneurysm is 3-4 cm in size (an annual ultrasonography may be recommended).  The aneurysm is 4-4.5 cm in size (an ultrasonography every 6 months may be recommended).  The aneurysm is larger than 4.5 cm in   size (your caregiver may ask that you be examined by a vascular surgeon). If your aneurysm is larger than 6 cm, surgical repair may be recommended. There are two main methods for repair of an aneurysm:   Endovascular  repair (a minimally invasive surgery). This is done most often.  Open repair. This method is used if an endovascular repair is not possible.   This information is not intended to replace advice given to you by your health care provider. Make sure you discuss any questions you have with your health care provider.   Document Released: 02/27/2005 Document Revised: 09/14/2012 Document Reviewed: 06/19/2012 Elsevier Interactive Patient Education 2016 [employer].    Steps to Quit Smoking  Smoking tobacco can be harmful to your health and can affect almost every organ in your body. Smoking puts you, and those around you, at risk for developing many serious chronic diseases. Quitting smoking is difficult, but it is one of the best things that you can do for your health. It is never too late to quit. WHAT ARE THE BENEFITS OF QUITTING SMOKING? When you quit smoking, you lower your risk of developing serious diseases and conditions, such as:  Lung cancer or lung disease, such as COPD.  Heart disease.  Stroke.  Heart attack.  Infertility.  Osteoporosis and bone fractures. Additionally, symptoms such as coughing, wheezing, and shortness of breath may get better when you quit. You may also find that you get sick less often because your body is stronger at fighting off colds and infections. If you are pregnant, quitting smoking can help to reduce your chances of having a baby of low birth weight. HOW DO I GET READY TO QUIT? When you decide to quit smoking, create a plan to make sure that you are successful. Before you quit:  Pick a date to quit. Set a date within the next two weeks to give you time to prepare.  Write down the reasons why you are quitting. Keep this list in places where you will see it often, such as on your bathroom mirror or in your car or wallet.  Identify the people, places, things, and activities that make you want to smoke (triggers) and avoid them. Make sure to take these  actions:  Throw away all cigarettes at home, at work, and in your car.  Throw away smoking accessories, such as Scientist, research (medical).  Clean your car and make sure to empty the ashtray.  Clean your home, including curtains and carpets.  Tell your family, friends, and coworkers that you are quitting. Support from your loved ones can make quitting easier.  Talk with your health care provider about your options for quitting smoking.  Find out what treatment options are covered by your health insurance. WHAT STRATEGIES CAN I USE TO QUIT SMOKING?  Talk with your healthcare provider about different strategies to quit smoking. Some strategies include:  Quitting smoking altogether instead of gradually lessening how much you smoke over a period of time. Research shows that quitting "cold [place]" is more successful than gradually quitting.  Attending in-person counseling to help you build problem-solving skills. You are more likely to have success in quitting if you attend several counseling sessions. Even short sessions of 10 minutes can be effective.  Finding resources and support systems that can help you to quit smoking and remain smoke-free after you quit. These resources are most helpful when you use them often. They can include:  Online chats with a Social worker.  Telephone quitlines.  Printed Furniture conservator/restorer.  Support groups or group counseling.  Text messaging programs.  Mobile phone applications.  Taking medicines to help you quit smoking. (If you are pregnant or breastfeeding, talk with your health care provider first.) Some medicines contain nicotine and some do not. Both types of medicines help with cravings, but the medicines that include nicotine help to relieve withdrawal symptoms. Your health care provider may recommend:  Nicotine patches, gum, or lozenges.  Nicotine inhalers or sprays.  Non-nicotine medicine that is taken by mouth. Talk with your health care  provider about combining strategies, such as taking medicines while you are also receiving in-person counseling. Using these two strategies together makes you more likely to succeed in quitting than if you used either strategy on its own. If you are pregnant or breastfeeding, talk with your health care provider about finding counseling or other support strategies to quit smoking. Do not take medicine to help you quit smoking unless told to do so by your health care provider. WHAT THINGS CAN I DO TO MAKE IT EASIER TO QUIT? Quitting smoking might feel overwhelming at first, but there is a lot that you can do to make it easier. Take these important actions:  Reach out to your family and friends and ask that they support and encourage you during this time. Call telephone quitlines, reach out to support groups, or work with a counselor for support.  Ask people who smoke to avoid smoking around you.  Avoid places that trigger you to smoke, such as bars, parties, or smoke-break areas at work.  Spend time around people who do not smoke.  Lessen stress in your life, because stress can be a smoking trigger for some people. To lessen stress, try:  Exercising regularly.  Deep-breathing exercises.  Yoga.  Meditating.  Performing a body scan. This involves closing your eyes, scanning your body from head to toe, and noticing which parts of your body are particularly tense. Purposefully relax the muscles in those areas.  Download or purchase mobile phone or tablet apps (applications) that can help you stick to your quit plan by providing reminders, tips, and encouragement. There are many free apps, such as QuitGuide from the [employer] Office manager for Disease Control and Prevention). You can find other support for quitting smoking (smoking cessation) through smokefree.gov and other websites. HOW WILL I FEEL WHEN I QUIT SMOKING? Within the first 24 hours of quitting smoking, you may start to feel some withdrawal  symptoms. These symptoms are usually most noticeable 2-3 days after quitting, but they usually do not last beyond 2-3 weeks. Changes or symptoms that you might experience include:  Mood swings.  Restlessness, anxiety, or irritation.  Difficulty concentrating.  Dizziness.  Strong cravings for sugary foods in addition to nicotine.  Mild weight gain.  Constipation.  Nausea.  Coughing or a sore throat.  Changes in how your medicines work in your body.  A depressed mood.  Difficulty sleeping (insomnia). After the first 2-3 weeks of quitting, you may start to notice more positive results, such as:  Improved sense of smell and taste.  Decreased coughing and sore throat.  Slower heart rate.  Lower blood pressure.  Clearer skin.  The ability to breathe more easily.  Fewer sick days. Quitting smoking is very challenging for most people. Do not get discouraged if you are not successful the first time. Some people need to make many attempts to quit before they achieve long-term success. Do your best to stick to your quit  plan, and talk with your health care provider if you have any questions or concerns.   This information is not intended to replace advice given to you by your health care provider. Make sure you discuss any questions you have with your health care provider.   Document Released: 05/14/2001 Document Revised: 10/04/2014 Document Reviewed: 10/04/2014 Elsevier Interactive Patient Education [employer].

## 2015-12-12 NOTE — Progress Notes (Signed)
[facility] [place]   CC: Follow up Abdominal Aortic Aneurysm  History of Present Illness  Tammy Brooks is a 82 y.o. (07-27-1933) female patient of Dr. Donnetta Hutching who returns today for surveillance and discussion of AAA. At her October 2015 visit with Dr. 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 There is also a new finding of a 9 cm pelvic mass. Radiologist interpretation is concerning for ovarian malignancy. Dr. Donnetta Hutching discussed this with her internist Dr Alain Marion, who as to coordinate referral to a gyn specialist. She had a possible abdominal BSO, in November 2015 for a pelvic mass, pt states this was not cancer. Pt states she had previously had a vaginal hysterectomy. She does have a known lower abdominal hernia since this surgery and this is being followed by one of her providers, this is mildly painful. Pt states she has inflammation in her low back and both hips, has pain in these areas, denies radiculopathy type pain. Pt states her mother and maternal aunt had a AAA.  The patient denies claudication in legs with walking. The patient reports history of stroke in 2000 as manifested by syncope, from this event denies any residual neurological deficit. Pt states Dr. Gwenlyn Found regularly checks her carotid arteries by ultrasound. She is blind in her left eye which she attributes as related to cataract surgery.  Pt states her gynecologist, Dr. Adan Sis who performed the BSO and tumor removal asked pt to ask our practice for a referal to a surgeon for repair of an abdominal hernia below the mid abdominal incision. Pt was referred to [place] [facility] for repair of LLQ abdominal hernia; pt states the surgeon told her that when she quit smoking he would repair this hernia.   Pt Diabetic: No Pt smoker: smoker (1 ppd, started at age 87 yrs). Pt states she "can't quit".     Past Medical History  Diagnosis Date  . Arthritis    . Blind left eye   . Heart murmur   . Hyperlipidemia   . Hypertension   . Myocardial infarction [facility])     mild/ age 73  . Ulcer   . CAD (coronary artery disease) 08/1995    50-55% second diag dz.; occluded OM  . PAD (peripheral artery disease) ([facility])     mild to mod Rt ICA stenosis followed by doppler-02/05/12  . Echocardiogram abnormal 03/2012    mild to mod AS, valve area1.58 cm2, mild concentric LVH, grade I diastolic dusfunction  . Tobacco abuse   . Abdominal aortic aneurysm ([place])     followed by Dr. Sherren Mocha Early  . Complication of anesthesia     "Blood pressure went down" with retinal surgery - no problems with previous surgeries  . Stroke [facility]) 2001    no residual problems  . Gout   . Pelvic mass    Past Surgical History  Procedure Laterality Date  . Cataract extraction w/ intraocular lens  implant, bilateral    . Eye surgery      lense implant removed/ blind in left eye  . Upper gastrointestinal endoscopy      treated for h-pylori  . Colonoscopy    . Polypectomy    . Tubal ligation    . Appendectomy    . Tonsillectomy and adenoidectomy    . Abdominal hysterectomy    . Cardiac catheterization  08/1995    50-55% second diag lesion, occluded OM  . Retinal detachment  surgery  2006  . Laparotomy N/A 04/26/2014    Procedure: EXPLORATORY LAPAROTOMY/RESECTION OF PELVIC MASS;  Surgeon: Alvino Chapel, MD;  Location: [facility];  Service: Gynecology;  Laterality: N/A;   Social History Social History   Social History  . Marital Status: Widowed    Spouse Name: N/A  . Number of Children: 5  . Years of Education: 9   Occupational History  . Oncologist     retired   Social History Main Topics  . Smoking status: Current Every Day Smoker -- 1.00 packs/day for 62 years    Types: Cigarettes  . Smokeless tobacco: Never Used  . Alcohol Use: 0.0 oz/week    0 Standard drinks or equivalent per week     Comment: occasionally - few drinks/month  . Drug Use: No  .  Sexual Activity: Not Currently   Other Topics Concern  . Not on file   Social History Narrative   9th grade. married '54- widowed Dec 10th'08. 4 sons - '55, '58, '60, '69; 1 daughter - '63; 12 grandchildren; 9 great-grandchildren. 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 Other children are close and attentive. ACP/End-of-life: No CPR, no mechanical ventilation, no futile/heroic measures.   Family History Family History  Problem Relation Age of Onset  . Aneurysm Mother     AAA  . Hypertension Mother   . Hyperlipidemia Mother   . Heart disease Father     CAD, MI x 3  . Heart attack Father   . Cancer Maternal Aunt     breast cancer  . Diabetes Sister   . Hyperlipidemia Sister   . Hypertension Sister     Current Outpatient Prescriptions on File Prior to Visit  Medication Sig Dispense Refill  . allopurinol (ZYLOPRIM) 100 MG tablet Take 1 tablet by mouth  daily 90 tablet 1  . amLODipine (NORVASC) 10 MG tablet Take 1 tablet (10 mg total) by mouth daily. 90 tablet 3  . aspirin EC 81 MG tablet Take 81 mg by mouth daily.    Marland Kitchen atorvastatin (LIPITOR) 20 MG tablet Take 20 mg by mouth daily.    . chlorthalidone (HYGROTON) 25 MG tablet TAKE 1/2 TABLET BY MOUTH DAILY AS DIRECTED 15 tablet 1  . colchicine 0.6 MG tablet Take 1 tablet (0.6 mg total) by mouth 4 (four) times daily as needed. 360 tablet 2  . dorzolamide-timolol (COSOPT) 22.3-6.8 MG/ML ophthalmic solution Place 1 drop into the left eye daily. (Patient taking differently: Place 1 drop into the left eye daily. 2-0.5% sol 10.0  One drop Left eye daily) 10 mL 3  . furosemide (LASIX) 20 MG tablet TAKE 1-2 TABLETS BY MOUTH  DAILY AS NEEDED FOR EDEMA. 180 tablet 1  . losartan (COZAAR) 100 MG tablet Take 1 tablet (100 mg total) by mouth daily. 90 tablet 3  . meloxicam (MOBIC) 15 MG tablet Take 1 tablet by mouth  daily as needed for pain 90 tablet 0  . metoprolol (LOPRESSOR) 50 MG tablet Take 0.5  tablets (25 mg total) by mouth 2 (two) times daily. 180 tablet 2  . Polyethylene Glycol 3350 POWD as needed.     Marland Kitchen tiZANidine (ZANAFLEX) 4 MG tablet Take 1 tablet (4 mg total) by mouth every 8 (eight) hours as needed for muscle spasms. 90 tablet 2  . estradiol (ESTRACE) 0.5 MG tablet TAKE 1 TABLET BY MOUTH EVERY DAY (Patient not taking: Reported on 12/12/2015) 90 tablet 0  .  naproxen sodium (ANAPROX) 220 MG tablet Take 220 mg by mouth as needed. Reported on 12/12/2015     No current facility-administered medications on file prior to visit.   Allergies  Allergen Reactions  . Nabumetone Anaphylaxis and Other (See Comments)    Causes inflammation    ROS: See HPI for pertinent positives and negatives.  Physical Examination  Filed Vitals:   12/12/15 1432 12/12/15 1436  BP: 140/68 144/72  Pulse: 85   Temp: 97.9 F (36.6 C)   TempSrc: Oral   Resp: 20   Height: '5\' 3"'$  (1.6 m)   Weight: 155 lb (70.308 kg)   SpO2: 89%    Body mass index is 27.46 kg/(m^2).  General: A&O x 3, WD.  Eyes: Left pupil is larger than right, is irregular in shape, and is non reactive to light.  Pulmonary: Sym exp, fair air movt, no rales or rhonchi, + transient wheezes. Occasional moist cough.  Cardiac: RRR, Nl S1, S2, + murmur.   Carotid Bruits Right Left   transmitted cardiac murmur Transmitted cardiac murmur  Aorta is not palpable Radial pulses are 2+ palpable and =   VASCULAR EXAM:     LE Pulses Right Left   FEMORAL  palpable  palpable    POPLITEAL not palpable  not palpable   POSTERIOR TIBIAL not palpable  not palpable    DORSALIS PEDIS  ANTERIOR TIBIAL faintly palpable  1+ palpable      Gastrointestinal: soft, NTND, -G/R, - HSM, - CVAT B, sagging area of tissue at  low mid abdomen that pt states is a hernia, midline well healed abdominal scar noted.  Musculoskeletal: M/S 5/5 throughout, Extremities without ischemic changes.  Neurologic: CN 2-12 intact, Pain and light touch intact in extremities are intact except, Motor exam as listed above.                Non-Invasive Vascular Imaging: (12/08/15)  AAA Duplex  ABDOMINAL AORTA DUPLEX EVALUATION    INDICATION: Abdominal aortic aneurysm     PREVIOUS INTERVENTION(S): NA    DUPLEX EXAM:     LOCATION DIAMETER AP (cm) DIAMETER TRANSVERSE (cm) VELOCITIES (cm/sec)  Aorta Proximal 2.5 2.6 68  Aorta Mid 4.2 4.4 21  Aorta Distal 4.2 4.2 30  Right Common Iliac Artery 1.3 1.3 116  Left Common Iliac Artery 1.2 1.2 133    Previous max aortic diameter:  4.30 x 4.45 Date: 03/28/2015     ADDITIONAL FINDINGS: See attached diagram.    IMPRESSION: Abdominal aortic aneurysm present measuring approximately 4.2 cm AP x 4.4 cm TRV, with non-occluding intramural thrombus present as well as proximal vessel tortuousity/angulation present.    Compared to the previous exam:  No significant changes noted when compared to previous exam of 03/28/2015.     Medical Decision Making  The patient is a 82 y.o. female who presents with asymptomatic AAA with no increase in size in 10 months.  The patient was counseled re smoking cessation and given several free resources re smoking cessation.   Based on this patient's exam and diagnostic studies, the patient will follow up in 6 months  with the following studies: AAA duplex.  Consideration for repair of AAA would be made when the size is 5.5 cm, growth > 1 cm/yr, and symptomatic status.  I emphasized the importance of maximal medical management including strict control of blood pressure, blood glucose, and lipid levels, antiplatelet agents, obtaining regular exercise, and cessation of smoking.   The patient was given information  about AAA including signs, symptoms,  treatment, and how to minimize the risk of enlargement and rupture of aneurysms.    The patient was advised to call 911 should the patient experience sudden onset abdominal or back pain.   Thank you for allowing [place] to participate in this patient's care.  Clemon Chambers, RN, MSN, FNP-C [facility] [place] Office: [facility] Physician: Early  12/12/2015, 2:57 PM

## 2015-12-21 DIAGNOSIS — H338 Other retinal detachments: Secondary | ICD-10-CM | POA: Diagnosis not present

## 2015-12-21 DIAGNOSIS — Z961 Presence of intraocular lens: Secondary | ICD-10-CM | POA: Diagnosis not present

## 2015-12-21 DIAGNOSIS — H5442 Blindness, left eye, normal vision right eye: Secondary | ICD-10-CM | POA: Diagnosis not present

## 2015-12-21 DIAGNOSIS — H5440 Blindness, one eye, unspecified eye: Secondary | ICD-10-CM | POA: Insufficient documentation

## 2015-12-21 DIAGNOSIS — H2702 Aphakia, left eye: Secondary | ICD-10-CM | POA: Diagnosis not present

## 2016-01-05 ENCOUNTER — Encounter: Payer: Self-pay | Admitting: Gastroenterology

## 2016-01-22 ENCOUNTER — Other Ambulatory Visit: Payer: Self-pay | Admitting: Internal Medicine

## 2016-01-24 DIAGNOSIS — H548 Legal blindness, as defined in USA: Secondary | ICD-10-CM | POA: Diagnosis not present

## 2016-01-24 DIAGNOSIS — H44512 Absolute glaucoma, left eye: Secondary | ICD-10-CM | POA: Diagnosis not present

## 2016-01-30 ENCOUNTER — Ambulatory Visit (INDEPENDENT_AMBULATORY_CARE_PROVIDER_SITE_OTHER): Payer: Medicare Other | Admitting: Internal Medicine

## 2016-01-30 ENCOUNTER — Encounter: Payer: Self-pay | Admitting: Internal Medicine

## 2016-01-30 VITALS — BP 158/80 | HR 69 | Temp 98.3°F | Ht 63.0 in | Wt 155.0 lb

## 2016-01-30 DIAGNOSIS — I10 Essential (primary) hypertension: Secondary | ICD-10-CM

## 2016-01-30 DIAGNOSIS — Z23 Encounter for immunization: Secondary | ICD-10-CM

## 2016-01-30 DIAGNOSIS — Z0000 Encounter for general adult medical examination without abnormal findings: Secondary | ICD-10-CM

## 2016-01-30 DIAGNOSIS — M10079 Idiopathic gout, unspecified ankle and foot: Secondary | ICD-10-CM | POA: Diagnosis not present

## 2016-01-30 DIAGNOSIS — M5440 Lumbago with sciatica, unspecified side: Secondary | ICD-10-CM | POA: Diagnosis not present

## 2016-01-30 DIAGNOSIS — E785 Hyperlipidemia, unspecified: Secondary | ICD-10-CM

## 2016-01-30 DIAGNOSIS — L570 Actinic keratosis: Secondary | ICD-10-CM

## 2016-01-30 MED ORDER — METHYLPREDNISOLONE ACETATE 80 MG/ML IJ SUSP
80.0000 mg | Freq: Once | INTRAMUSCULAR | Status: AC
  Administered 2016-01-30: 80 mg via INTRAMUSCULAR

## 2016-01-30 NOTE — Patient Instructions (Signed)
Preventive Care for Adults, Female A healthy lifestyle and preventive care can promote health and wellness. Preventive health guidelines for women include the following key practices.  A routine yearly physical is a good way to check with your health care provider about your health and preventive screening. It is a chance to share any concerns and updates on your health and to receive a thorough exam.  Visit your dentist for a routine exam and preventive care every 6 months. Brush your teeth twice a day and floss once a day. Good oral hygiene prevents tooth decay and gum disease.  The frequency of eye exams is based on your age, health, family medical history, use of contact lenses, and other factors. Follow your health care provider's recommendations for frequency of eye exams.  Eat a healthy diet. Foods like vegetables, fruits, whole grains, low-fat dairy products, and lean protein foods contain the nutrients you need without too many calories. Decrease your intake of foods high in solid fats, added sugars, and salt. Eat the right amount of calories for you.Get information about a proper diet from your health care provider, if necessary.  Regular physical exercise is one of the most important things you can do for your health. Most adults should get at least 150 minutes of moderate-intensity exercise (any activity that increases your heart rate and causes you to sweat) each week. In addition, most adults need muscle-strengthening exercises on 2 or more days a week.  Maintain a healthy weight. The body mass index (BMI) is a screening tool to identify possible weight problems. It provides an estimate of body fat based on height and weight. Your health care provider can find your BMI and can help you achieve or maintain a healthy weight.For adults 20 years and older:  A BMI below 18.5 is considered underweight.  A BMI of 18.5 to 24.9 is normal.  A BMI of 25 to 29.9 is considered overweight.  A  BMI of 30 and above is considered obese.  Maintain normal blood lipids and cholesterol levels by exercising and minimizing your intake of saturated fat. Eat a balanced diet with plenty of fruit and vegetables. Blood tests for lipids and cholesterol should begin at age 45 and be repeated every 5 years. If your lipid or cholesterol levels are high, you are over 50, or you are at high risk for heart disease, you may need your cholesterol levels checked more frequently.Ongoing high lipid and cholesterol levels should be treated with medicines if diet and exercise are not working.  If you smoke, find out from your health care provider how to quit. If you do not use tobacco, do not start.  Lung cancer screening is recommended for adults aged 45-80 years who are at high risk for developing lung cancer because of a history of smoking. A yearly low-dose CT scan of the lungs is recommended for people who have at least a 30-pack-year history of smoking and are a current smoker or have quit within the past 15 years. A pack year of smoking is smoking an average of 1 pack of cigarettes a day for 1 year (for example: 1 pack a day for 30 years or 2 packs a day for 15 years). Yearly screening should continue until the smoker has stopped smoking for at least 15 years. Yearly screening should be stopped for people who develop a health problem that would prevent them from having lung cancer treatment.  If you are pregnant, do not drink alcohol. If you are  breastfeeding, be very cautious about drinking alcohol. If you are not pregnant and choose to drink alcohol, do not have more than 1 drink per day. One drink is considered to be 12 ounces (355 mL) of beer, 5 ounces (148 mL) of wine, or 1.5 ounces (44 mL) of liquor.  Avoid use of street drugs. Do not share needles with anyone. Ask for help if you need support or instructions about stopping the use of drugs.  High blood pressure causes heart disease and increases the risk  of stroke. Your blood pressure should be checked at least every 1 to 2 years. Ongoing high blood pressure should be treated with medicines if weight loss and exercise do not work.  If you are 41-52 years old, ask your health care provider if you should take aspirin to prevent strokes.  Diabetes screening is done by taking a blood sample to check your blood glucose level after you have not eaten for a certain period of time (fasting). If you are not overweight and you do not have risk factors for diabetes, you should be screened once every 3 years starting at age 21. If you are overweight or obese and you are 62-58 years of age, you should be screened for diabetes every year as part of your cardiovascular risk assessment.  Breast cancer screening is essential preventive care for women. You should practice "breast self-awareness." This means understanding the normal appearance and feel of your breasts and may include breast self-examination. Any changes detected, no matter how small, should be reported to a health care provider. Women in their 21s and 30s should have a clinical breast exam (CBE) by a health care provider as part of a regular health exam every 1 to 3 years. After age 63, women should have a CBE every year. Starting at age 31, women should consider having a mammogram (breast X-ray test) every year. Women who have a family history of breast cancer should talk to their health care provider about genetic screening. Women at a high risk of breast cancer should talk to their health care providers about having an MRI and a mammogram every year.  Breast cancer gene (BRCA)-related cancer risk assessment is recommended for women who have family members with BRCA-related cancers. BRCA-related cancers include breast, ovarian, tubal, and peritoneal cancers. Having family members with these cancers may be associated with an increased risk for harmful changes (mutations) in the breast cancer genes BRCA1 and  BRCA2. Results of the assessment will determine the need for genetic counseling and BRCA1 and BRCA2 testing.  Your health care provider may recommend that you be screened regularly for cancer of the pelvic organs (ovaries, uterus, and vagina). This screening involves a pelvic examination, including checking for microscopic changes to the surface of your cervix (Pap test). You may be encouraged to have this screening done every 3 years, beginning at age 23.  For women ages 23-65, health care providers may recommend pelvic exams and Pap testing every 3 years, or they may recommend the Pap and pelvic exam, combined with testing for human papilloma virus (HPV), every 5 years. Some types of HPV increase your risk of cervical cancer. Testing for HPV may also be done on women of any age with unclear Pap test results.  Other health care providers may not recommend any screening for nonpregnant women who are considered low risk for pelvic cancer and who do not have symptoms. Ask your health care provider if a screening pelvic exam is right for  you.  If you have had past treatment for cervical cancer or a condition that could lead to cancer, you need Pap tests and screening for cancer for at least 20 years after your treatment. If Pap tests have been discontinued, your risk factors (such as having a new sexual partner) need to be reassessed to determine if screening should resume. Some women have medical problems that increase the chance of getting cervical cancer. In these cases, your health care provider may recommend more frequent screening and Pap tests.  Colorectal cancer can be detected and often prevented. Most routine colorectal cancer screening begins at the age of 81 years and continues through age 49 years. However, your health care provider may recommend screening at an earlier age if you have risk factors for colon cancer. On a yearly basis, your health care provider may provide home test kits to check  for hidden blood in the stool. Use of a small camera at the end of a tube, to directly examine the colon (sigmoidoscopy or colonoscopy), can detect the earliest forms of colorectal cancer. Talk to your health care provider about this at age 2, when routine screening begins. Direct exam of the colon should be repeated every 5-10 years through age 20 years, unless early forms of precancerous polyps or small growths are found.  People who are at an increased risk for hepatitis B should be screened for this virus. You are considered at high risk for hepatitis B if:  You were born in a country where hepatitis B occurs often. Talk with your health care provider about which countries are considered high risk.  Your parents were born in a high-risk country and you have not received a shot to protect against hepatitis B (hepatitis B vaccine).  You have HIV or AIDS.  You use needles to inject street drugs.  You live with, or have sex with, someone who has hepatitis B.  You get hemodialysis treatment.  You take certain medicines for conditions like cancer, organ transplantation, and autoimmune conditions.  Hepatitis C blood testing is recommended for all people born from [birth year] through 1965 and any individual with known risks for hepatitis C.  Practice safe sex. Use condoms and avoid high-risk sexual practices to reduce the spread of sexually transmitted infections (STIs). STIs include gonorrhea, chlamydia, syphilis, trichomonas, herpes, HPV, and human immunodeficiency virus (HIV). Herpes, HIV, and HPV are viral illnesses that have no cure. They can result in disability, cancer, and death.  You should be screened for sexually transmitted illnesses (STIs) including gonorrhea and chlamydia if:  You are sexually active and are younger than 24 years.  You are older than 24 years and your health care provider tells you that you are at risk for this type of infection.  Your sexual activity has changed  since you were last screened and you are at an increased risk for chlamydia or gonorrhea. Ask your health care provider if you are at risk.  If you are at risk of being infected with HIV, it is recommended that you take a prescription medicine daily to prevent HIV infection. This is called preexposure prophylaxis (PrEP). You are considered at risk if:  You are sexually active and do not regularly use condoms or know the HIV status of your partner(s).  You take drugs by injection.  You are sexually active with a partner who has HIV.  Talk with your health care provider about whether you are at high risk of being infected with HIV. If  you choose to begin PrEP, you should first be tested for HIV. You should then be tested every 3 months for as long as you are taking PrEP.  Osteoporosis is a disease in which the bones lose minerals and strength with aging. This can result in serious bone fractures or breaks. The risk of osteoporosis can be identified using a bone density scan. Women ages 67 years and over and women at risk for fractures or osteoporosis should discuss screening with their health care providers. Ask your health care provider whether you should take a calcium supplement or vitamin D to reduce the rate of osteoporosis.  Menopause can be associated with physical symptoms and risks. Hormone replacement therapy is available to decrease symptoms and risks. You should talk to your health care provider about whether hormone replacement therapy is right for you.  Use sunscreen. Apply sunscreen liberally and repeatedly throughout the day. You should seek shade when your shadow is shorter than you. Protect yourself by wearing long sleeves, pants, a wide-brimmed hat, and sunglasses year round, whenever you are outdoors.  Once a month, do a whole body skin exam, using a mirror to look at the skin on your back. Tell your health care provider of new moles, moles that have irregular borders, moles that  are larger than a pencil eraser, or moles that have changed in shape or color.  Stay current with required vaccines (immunizations).  Influenza vaccine. All adults should be immunized every year.  Tetanus, diphtheria, and acellular pertussis (Td, Tdap) vaccine. Pregnant women should receive 1 dose of Tdap vaccine during each pregnancy. The dose should be obtained regardless of the length of time since the last dose. Immunization is preferred during the 27th-36th week of gestation. An adult who has not previously received Tdap or who does not know her vaccine status should receive 1 dose of Tdap. This initial dose should be followed by tetanus and diphtheria toxoids (Td) booster doses every 10 years. Adults with an unknown or incomplete history of completing a 3-dose immunization series with Td-containing vaccines should begin or complete a primary immunization series including a Tdap dose. Adults should receive a Td booster every 10 years.  Varicella vaccine. An adult without evidence of immunity to varicella should receive 2 doses or a second dose if she has previously received 1 dose. Pregnant females who do not have evidence of immunity should receive the first dose after pregnancy. This first dose should be obtained before leaving the health care facility. The second dose should be obtained 4-8 weeks after the first dose.  Human papillomavirus (HPV) vaccine. Females aged 13-26 years who have not received the vaccine previously should obtain the 3-dose series. The vaccine is not recommended for use in pregnant females. However, pregnancy testing is not needed before receiving a dose. If a female is found to be pregnant after receiving a dose, no treatment is needed. In that case, the remaining doses should be delayed until after the pregnancy. Immunization is recommended for any person with an immunocompromised condition through the age of 61 years if she did not get any or all doses earlier. During the  3-dose series, the second dose should be obtained 4-8 weeks after the first dose. The third dose should be obtained 24 weeks after the first dose and 16 weeks after the second dose.  Zoster vaccine. One dose is recommended for adults aged 30 years or older unless certain conditions are present.  Measles, mumps, and rubella (MMR) vaccine. Adults born  before 1957 generally are considered immune to measles and mumps. Adults born in 1957 or later should have 1 or more doses of MMR vaccine unless there is a contraindication to the vaccine or there is laboratory evidence of immunity to each of the three diseases. A routine second dose of MMR vaccine should be obtained at least 28 days after the first dose for students attending postsecondary schools, health care workers, or international travelers. People who received inactivated measles vaccine or an unknown type of measles vaccine during 1963-1967 should receive 2 doses of MMR vaccine. People who received inactivated mumps vaccine or an unknown type of mumps vaccine before 1979 and are at high risk for mumps infection should consider immunization with 2 doses of MMR vaccine. For females of childbearing age, rubella immunity should be determined. If there is no evidence of immunity, females who are not pregnant should be vaccinated. If there is no evidence of immunity, females who are pregnant should delay immunization until after pregnancy. Unvaccinated health care workers born before 1957 who lack laboratory evidence of measles, mumps, or rubella immunity or laboratory confirmation of disease should consider measles and mumps immunization with 2 doses of MMR vaccine or rubella immunization with 1 dose of MMR vaccine.  Pneumococcal 13-valent conjugate (PCV13) vaccine. When indicated, a person who is uncertain of his immunization history and has no record of immunization should receive the PCV13 vaccine. All adults 65 years of age and older should receive this  vaccine. An adult aged 19 years or older who has certain medical conditions and has not been previously immunized should receive 1 dose of PCV13 vaccine. This PCV13 should be followed with a dose of pneumococcal polysaccharide (PPSV23) vaccine. Adults who are at high risk for pneumococcal disease should obtain the PPSV23 vaccine at least 8 weeks after the dose of PCV13 vaccine. Adults older than 80 years of age who have normal immune system function should obtain the PPSV23 vaccine dose at least 1 year after the dose of PCV13 vaccine.  Pneumococcal polysaccharide (PPSV23) vaccine. When PCV13 is also indicated, PCV13 should be obtained first. All adults aged 65 years and older should be immunized. An adult younger than age 65 years who has certain medical conditions should be immunized. Any person who resides in a nursing home or long-term care facility should be immunized. An adult smoker should be immunized. People with an immunocompromised condition and certain other conditions should receive both PCV13 and PPSV23 vaccines. People with human immunodeficiency virus (HIV) infection should be immunized as soon as possible after diagnosis. Immunization during chemotherapy or radiation therapy should be avoided. Routine use of PPSV23 vaccine is not recommended for American Indians, [place] Natives, or people younger than 65 years unless there are medical conditions that require PPSV23 vaccine. When indicated, people who have unknown immunization and have no record of immunization should receive PPSV23 vaccine. One-time revaccination 5 years after the first dose of PPSV23 is recommended for people aged 19-64 years who have chronic kidney failure, nephrotic syndrome, asplenia, or immunocompromised conditions. People who received 1-2 doses of PPSV23 before age 65 years should receive another dose of PPSV23 vaccine at age 65 years or later if at least 5 years have passed since the previous dose. Doses of PPSV23 are not  needed for people immunized with PPSV23 at or after age 65 years.  Meningococcal vaccine. Adults with asplenia or persistent complement component deficiencies should receive 2 doses of quadrivalent meningococcal conjugate (MenACWY-D) vaccine. The doses should be obtained   at least 2 months apart. Microbiologists working with certain meningococcal bacteria, [place] recruits, people at risk during an outbreak, and people who travel to or live in countries with a high rate of meningitis should be immunized. A first-year college student up through age 34 years who is living in a residence hall should receive a dose if she did not receive a dose on or after her 16th birthday. Adults who have certain high-risk conditions should receive one or more doses of vaccine.  Hepatitis A vaccine. Adults who wish to be protected from this disease, have certain high-risk conditions, work with hepatitis A-infected animals, work in hepatitis A research labs, or travel to or work in countries with a high rate of hepatitis A should be immunized. Adults who were previously unvaccinated and who anticipate close contact with an international adoptee during the first 60 days after arrival in the [place] from a country with a high rate of hepatitis A should be immunized.  Hepatitis B vaccine. Adults who wish to be protected from this disease, have certain high-risk conditions, may be exposed to blood or other infectious body fluids, are household contacts or sex partners of hepatitis B positive people, are clients or workers in certain care facilities, or travel to or work in countries with a high rate of hepatitis B should be immunized.  Haemophilus influenzae type b (Hib) vaccine. A previously unvaccinated person with asplenia or sickle cell disease or having a scheduled splenectomy should receive 1 dose of Hib vaccine. Regardless of previous immunization, a recipient of a hematopoietic stem cell transplant should receive a  3-dose series 6-12 months after her successful transplant. Hib vaccine is not recommended for adults with HIV infection. Preventive Services / Frequency Ages 35 to 4 years  Blood pressure check.** / Every 3-5 years.  Lipid and cholesterol check.** / Every 5 years beginning at age 60.  Clinical breast exam.** / Every 3 years for women in their 71s and 10s.  BRCA-related cancer risk assessment.** / For women who have family members with a BRCA-related cancer (breast, ovarian, tubal, or peritoneal cancers).  Pap test.** / Every 2 years from ages 76 through 26. Every 3 years starting at age 61 through age 76 or 93 with a history of 3 consecutive normal Pap tests.  HPV screening.** / Every 3 years from ages 37 through ages 60 to 51 with a history of 3 consecutive normal Pap tests.  Hepatitis C blood test.** / For any individual with known risks for hepatitis C.  Skin self-exam. / Monthly.  Influenza vaccine. / Every year.  Tetanus, diphtheria, and acellular pertussis (Tdap, Td) vaccine.** / Consult your health care provider. Pregnant women should receive 1 dose of Tdap vaccine during each pregnancy. 1 dose of Td every 10 years.  Varicella vaccine.** / Consult your health care provider. Pregnant females who do not have evidence of immunity should receive the first dose after pregnancy.  HPV vaccine. / 3 doses over 6 months, if 93 and younger. The vaccine is not recommended for use in pregnant females. However, pregnancy testing is not needed before receiving a dose.  Measles, mumps, rubella (MMR) vaccine.** / You need at least 1 dose of MMR if you were born in 1957 or later. You may also need a 2nd dose. For females of childbearing age, rubella immunity should be determined. If there is no evidence of immunity, females who are not pregnant should be vaccinated. If there is no evidence of immunity, females who are  pregnant should delay immunization until after pregnancy.  Pneumococcal  13-valent conjugate (PCV13) vaccine.** / Consult your health care provider.  Pneumococcal polysaccharide (PPSV23) vaccine.** / 1 to 2 doses if you smoke cigarettes or if you have certain conditions.  Meningococcal vaccine.** / 1 dose if you are age 68 to 8 years and a Market researcher living in a residence hall, or have one of several medical conditions, you need to get vaccinated against meningococcal disease. You may also need additional booster doses.  Hepatitis A vaccine.** / Consult your health care provider.  Hepatitis B vaccine.** / Consult your health care provider.  Haemophilus influenzae type b (Hib) vaccine.** / Consult your health care provider. Ages 7 to 53 years  Blood pressure check.** / Every year.  Lipid and cholesterol check.** / Every 5 years beginning at age 25 years.  Lung cancer screening. / Every year if you are aged 11-80 years and have a 30-pack-year history of smoking and currently smoke or have quit within the past 15 years. Yearly screening is stopped once you have quit smoking for at least 15 years or develop a health problem that would prevent you from having lung cancer treatment.  Clinical breast exam.** / Every year after age 48 years.  BRCA-related cancer risk assessment.** / For women who have family members with a BRCA-related cancer (breast, ovarian, tubal, or peritoneal cancers).  Mammogram.** / Every year beginning at age 41 years and continuing for as long as you are in good health. Consult with your health care provider.  Pap test.** / Every 3 years starting at age 65 years through age 37 or 70 years with a history of 3 consecutive normal Pap tests.  HPV screening.** / Every 3 years from ages 72 years through ages 60 to 40 years with a history of 3 consecutive normal Pap tests.  Fecal occult blood test (FOBT) of stool. / Every year beginning at age 21 years and continuing until age 5 years. You may not need to do this test if you get  a colonoscopy every 10 years.  Flexible sigmoidoscopy or colonoscopy.** / Every 5 years for a flexible sigmoidoscopy or every 10 years for a colonoscopy beginning at age 35 years and continuing until age 48 years.  Hepatitis C blood test.** / For all people born from [birth year] through 1963 and any individual with known risks for hepatitis C.  Skin self-exam. / Monthly.  Influenza vaccine. / Every year.  Tetanus, diphtheria, and acellular pertussis (Tdap/Td) vaccine.** / Consult your health care provider. Pregnant women should receive 1 dose of Tdap vaccine during each pregnancy. 1 dose of Td every 10 years.  Varicella vaccine.** / Consult your health care provider. Pregnant females who do not have evidence of immunity should receive the first dose after pregnancy.  Zoster vaccine.** / 1 dose for adults aged 30 years or older.  Measles, mumps, rubella (MMR) vaccine.** / You need at least 1 dose of MMR if you were born in 1957 or later. You may also need a second dose. For females of childbearing age, rubella immunity should be determined. If there is no evidence of immunity, females who are not pregnant should be vaccinated. If there is no evidence of immunity, females who are pregnant should delay immunization until after pregnancy.  Pneumococcal 13-valent conjugate (PCV13) vaccine.** / Consult your health care provider.  Pneumococcal polysaccharide (PPSV23) vaccine.** / 1 to 2 doses if you smoke cigarettes or if you have certain conditions.  Meningococcal vaccine.** /  Consult your health care provider.  Hepatitis A vaccine.** / Consult your health care provider.  Hepatitis B vaccine.** / Consult your health care provider.  Haemophilus influenzae type b (Hib) vaccine.** / Consult your health care provider. Ages 64 years and over  Blood pressure check.** / Every year.  Lipid and cholesterol check.** / Every 5 years beginning at age 23 years.  Lung cancer screening. / Every year if you  are aged 16-80 years and have a 30-pack-year history of smoking and currently smoke or have quit within the past 15 years. Yearly screening is stopped once you have quit smoking for at least 15 years or develop a health problem that would prevent you from having lung cancer treatment.  Clinical breast exam.** / Every year after age 74 years.  BRCA-related cancer risk assessment.** / For women who have family members with a BRCA-related cancer (breast, ovarian, tubal, or peritoneal cancers).  Mammogram.** / Every year beginning at age 44 years and continuing for as long as you are in good health. Consult with your health care provider.  Pap test.** / Every 3 years starting at age 58 years through age 22 or 39 years with 3 consecutive normal Pap tests. Testing can be stopped between 65 and 70 years with 3 consecutive normal Pap tests and no abnormal Pap or HPV tests in the past 10 years.  HPV screening.** / Every 3 years from ages 64 years through ages 70 or 61 years with a history of 3 consecutive normal Pap tests. Testing can be stopped between 65 and 70 years with 3 consecutive normal Pap tests and no abnormal Pap or HPV tests in the past 10 years.  Fecal occult blood test (FOBT) of stool. / Every year beginning at age 40 years and continuing until age 27 years. You may not need to do this test if you get a colonoscopy every 10 years.  Flexible sigmoidoscopy or colonoscopy.** / Every 5 years for a flexible sigmoidoscopy or every 10 years for a colonoscopy beginning at age 7 years and continuing until age 32 years.  Hepatitis C blood test.** / For all people born from [birth year] through 1963 and any individual with known risks for hepatitis C.  Osteoporosis screening.** / A one-time screening for women ages 30 years and over and women at risk for fractures or osteoporosis.  Skin self-exam. / Monthly.  Influenza vaccine. / Every year.  Tetanus, diphtheria, and acellular pertussis (Tdap/Td)  vaccine.** / 1 dose of Td every 10 years.  Varicella vaccine.** / Consult your health care provider.  Zoster vaccine.** / 1 dose for adults aged 35 years or older.  Pneumococcal 13-valent conjugate (PCV13) vaccine.** / Consult your health care provider.  Pneumococcal polysaccharide (PPSV23) vaccine.** / 1 dose for all adults aged 46 years and older.  Meningococcal vaccine.** / Consult your health care provider.  Hepatitis A vaccine.** / Consult your health care provider.  Hepatitis B vaccine.** / Consult your health care provider.  Haemophilus influenzae type b (Hib) vaccine.** / Consult your health care provider. ** Family history and personal history of risk and conditions may change your health care provider's recommendations.   This information is not intended to replace advice given to you by your health care provider. Make sure you discuss any questions you have with your health care provider.   Document Released: 07/16/2001 Document Revised: 06/10/2014 Document Reviewed: 10/15/2010 Elsevier Interactive Patient Education [employer].

## 2016-01-30 NOTE — Assessment & Plan Note (Signed)
Multiple - worse Derm appt in [place]

## 2016-01-30 NOTE — Progress Notes (Signed)
Pre visit review using our clinic review tool, if applicable. No additional management support is needed unless otherwise documented below in the visit note. 

## 2016-01-30 NOTE — Assessment & Plan Note (Signed)
Amlodipine, lisinopril, metoprolol

## 2016-01-30 NOTE — Assessment & Plan Note (Signed)
Simvastatin Labs

## 2016-01-30 NOTE — Assessment & Plan Note (Signed)
Here for medicare wellness/physical  Diet: heart healthy  Physical activity: not sedentary  Depression/mood screen: negative  Hearing: intact to whispered voice  Visual acuity: grossly normal, performs annual eye exam  ADLs: capable  Fall risk: low Home safety: good  Cognitive evaluation: intact to orientation, naming, recall and repetition  EOL planning: adv directives, full code/ I agree  I have personally reviewed and have noted  1. The patient's medical, surgical and social history  2. Their use of alcohol, tobacco or illicit drugs  3. Their current medications and supplements  4. The patient's functional ability including ADL's, fall risks, home safety risks and hearing or visual impairment.  5. Diet and physical activities  6. Evidence for depression or mood disorders 7. The roster of all physicians providing medical care to patient - is listed in the Snapshot section of the chart and reviewed today.    Today patient counseled on age appropriate routine health concerns for screening and prevention, each reviewed and up to date or declined. Immunizations reviewed and up to date or declined. Labs ordered and reviewed. Risk factors for depression reviewed and negative. Hearing function and visual acuity are intact. ADLs screened and addressed as needed. Functional ability and level of safety reviewed and appropriate. Education, counseling and referrals performed based on assessed risks today. Patient provided with a copy of personalized plan for preventive services.

## 2016-01-30 NOTE — Assessment & Plan Note (Signed)
On allopurinol Depo-medrol IM

## 2016-01-30 NOTE — Progress Notes (Signed)
Subjective:  Patient ID: Tammy Brooks, female    DOB: 09-13-1933  Age: 82 y.o. MRN: [id number]  CC: No chief complaint on file.   HPI FELIX PRATT presents for a well exam C/o gout in feet - worse F/u HTN, LBP, dyslipidemia C/o skin spots   Outpatient Medications Prior to Visit  Medication Sig Dispense Refill  . allopurinol (ZYLOPRIM) 100 MG tablet Take 1 tablet by mouth  daily 90 tablet 1  . amLODipine (NORVASC) 10 MG tablet Take 1 tablet (10 mg total) by mouth daily. 90 tablet 3  . aspirin EC 81 MG tablet Take 81 mg by mouth daily.    Marland Kitchen atorvastatin (LIPITOR) 20 MG tablet Take 1 tablet by mouth  every day 90 tablet 0  . chlorthalidone (HYGROTON) 25 MG tablet TAKE 1/2 TABLET BY MOUTH DAILY AS DIRECTED 15 tablet 1  . cholecalciferol (VITAMIN D) 1000 units tablet Take 1,000 Units by mouth daily.    . colchicine 0.6 MG tablet Take 1 tablet (0.6 mg total) by mouth 4 (four) times daily as needed. 360 tablet 2  . dorzolamide-timolol (COSOPT) 22.3-6.8 MG/ML ophthalmic solution Place 1 drop into the left eye daily. (Patient taking differently: Place 1 drop into the left eye daily. 2-0.5% sol 10.0  One drop Left eye daily) 10 mL 3  . furosemide (LASIX) 20 MG tablet TAKE 1-2 TABLETS BY MOUTH  DAILY AS NEEDED FOR EDEMA. 180 tablet 1  . losartan (COZAAR) 100 MG tablet Take 1 tablet (100 mg total) by mouth daily. 90 tablet 3  . meloxicam (MOBIC) 15 MG tablet Take 1 tablet by mouth  daily as needed for pain 90 tablet 0  . metoprolol (LOPRESSOR) 50 MG tablet Take one-half tablet by  mouth two times daily 90 tablet 0  . naproxen sodium (ANAPROX) 220 MG tablet Take 220 mg by mouth as needed. Reported on 12/12/2015    . Polyethylene Glycol 3350 POWD as needed.     Marland Kitchen tiZANidine (ZANAFLEX) 4 MG tablet Take 1 tablet (4 mg total) by mouth every 8 (eight) hours as needed for muscle spasms. 90 tablet 2  . estradiol (ESTRACE) 0.5 MG tablet TAKE 1 TABLET BY MOUTH EVERY DAY (Patient not taking: Reported on  01/30/2016) 90 tablet 0   No facility-administered medications prior to visit.     ROS Review of Systems  Constitutional: Negative for activity change, appetite change, chills, fatigue and unexpected weight change.  HENT: Negative for congestion, mouth sores and sinus pressure.   Eyes: Negative for visual disturbance.  Respiratory: Negative for cough and chest tightness.   Gastrointestinal: Negative for abdominal pain and nausea.  Genitourinary: Negative for difficulty urinating, frequency and vaginal pain.  Musculoskeletal: Positive for arthralgias. Negative for back pain and gait problem.  Skin: Negative for pallor and rash.  Neurological: Negative for dizziness, tremors, weakness, numbness and headaches.  Psychiatric/Behavioral: Negative for confusion and sleep disturbance.    Objective:  BP (!) 158/80   Pulse 69   Temp 98.3 F (36.8 C) (Oral)   Ht '5\' 3"'$  (1.6 m)   Wt 155 lb (70.3 kg)   SpO2 98%   BMI 27.46 kg/m   BP Readings from Last 3 Encounters:  01/30/16 (!) 158/80  12/12/15 (!) 144/72  09/29/15 (!) 141/60    Wt Readings from Last 3 Encounters:  01/30/16 155 lb (70.3 kg)  12/12/15 155 lb (70.3 kg)  09/29/15 158 lb 8 oz (71.9 kg)    Physical Exam  Constitutional:  She appears well-developed. No distress.  HENT:  Head: Normocephalic.  Right Ear: External ear normal.  Left Ear: External ear normal.  Nose: Nose normal.  Mouth/Throat: Oropharynx is clear and moist.  Eyes: Conjunctivae are normal. Pupils are equal, round, and reactive to light. Right eye exhibits no discharge. Left eye exhibits no discharge.  Neck: Normal range of motion. Neck supple. No JVD present. No tracheal deviation present. No thyromegaly present.  Cardiovascular: Normal rate, regular rhythm and normal heart sounds.   Pulmonary/Chest: No stridor. No respiratory distress. She has no wheezes.  Abdominal: Soft. Bowel sounds are normal. She exhibits no distension and no mass. There is no  tenderness. There is no rebound and no guarding.  Musculoskeletal: She exhibits tenderness. She exhibits no edema.  Lymphadenopathy:    She has no cervical adenopathy.  Neurological: She displays normal reflexes. No cranial nerve deficit. She exhibits normal muscle tone. Coordination normal.  Skin: No rash noted. No erythema.  Psychiatric: She has a normal mood and affect. Her behavior is normal. Judgment and thought content normal.  L foot hurts AKs  Lab Results  Component Value Date   WBC 8.5 04/27/2014   HGB 12.8 04/27/2014   HCT 39.0 04/27/2014   PLT 208 04/27/2014   GLUCOSE 107 (H) 08/30/2014   CHOL 148 01/17/2014   TRIG 116.0 01/17/2014   HDL 38.10 (L) 01/17/2014   LDLDIRECT 76.7 11/17/2008   LDLCALC 87 01/17/2014   ALT 15 04/20/2014   AST 20 04/20/2014   NA 138 08/30/2014   K 4.3 08/30/2014   CL 106 08/30/2014   CREATININE 0.84 08/30/2014   BUN 17 08/30/2014   CO2 29 08/30/2014   TSH 4.57 (H) 01/17/2014    No results found.  Assessment & Plan:   There are no diagnoses linked to this encounter. I am having Ms. Drees maintain her aspirin EC, colchicine, dorzolamide-timolol, Polyethylene Glycol 3350, tiZANidine, naproxen sodium, furosemide, amLODipine, losartan, estradiol, allopurinol, chlorthalidone, meloxicam, cholecalciferol, metoprolol, and atorvastatin.  No orders of the defined types were placed in this encounter.    Follow-up: No Follow-up on file.  Walker Kehr, MD

## 2016-01-30 NOTE — Addendum Note (Signed)
Addended by: Cresenciano Lick on: 01/30/2016 03:46 PM   Modules accepted: Orders

## 2016-02-01 ENCOUNTER — Other Ambulatory Visit (INDEPENDENT_AMBULATORY_CARE_PROVIDER_SITE_OTHER): Payer: Medicare Other

## 2016-02-01 DIAGNOSIS — I10 Essential (primary) hypertension: Secondary | ICD-10-CM

## 2016-02-01 DIAGNOSIS — M10079 Idiopathic gout, unspecified ankle and foot: Secondary | ICD-10-CM

## 2016-02-01 DIAGNOSIS — E785 Hyperlipidemia, unspecified: Secondary | ICD-10-CM

## 2016-02-01 DIAGNOSIS — Z0000 Encounter for general adult medical examination without abnormal findings: Secondary | ICD-10-CM | POA: Diagnosis not present

## 2016-02-01 LAB — CBC WITH DIFFERENTIAL/PLATELET
BASOS ABS: 0 10*3/uL (ref 0.0–0.1)
Basophils Relative: 0.4 % (ref 0.0–3.0)
EOS ABS: 0.3 10*3/uL (ref 0.0–0.7)
Eosinophils Relative: 3.7 % (ref 0.0–5.0)
HEMATOCRIT: 41.5 % (ref 36.0–46.0)
Hemoglobin: 14.1 g/dL (ref 12.0–15.0)
LYMPHS PCT: 21.5 % (ref 12.0–46.0)
Lymphs Abs: 1.9 10*3/uL (ref 0.7–4.0)
MCHC: 33.9 g/dL (ref 30.0–36.0)
MCV: 86.9 fl (ref 78.0–100.0)
MONO ABS: 0.6 10*3/uL (ref 0.1–1.0)
Monocytes Relative: 6.8 % (ref 3.0–12.0)
NEUTROS ABS: 6 10*3/uL (ref 1.4–7.7)
Neutrophils Relative %: 67.6 % (ref 43.0–77.0)
PLATELETS: 208 10*3/uL (ref 150.0–400.0)
RBC: 4.78 Mil/uL (ref 3.87–5.11)
RDW: 13.9 % (ref 11.5–15.5)
WBC: 8.9 10*3/uL (ref 4.0–10.5)

## 2016-02-01 LAB — LIPID PANEL
CHOLESTEROL: 143 mg/dL (ref 0–200)
HDL: 31.6 mg/dL — ABNORMAL LOW (ref >39.00)
LDL Cholesterol: 83 mg/dL (ref 0–99)
NONHDL: 111.51
Total CHOL/HDL Ratio: 5
Triglycerides: 141 mg/dL (ref 0.0–149.0)
VLDL: 28.2 mg/dL (ref 0.0–40.0)

## 2016-02-01 LAB — TSH: TSH: 7.63 u[IU]/mL — AB (ref 0.35–4.50)

## 2016-02-01 LAB — URINALYSIS
BILIRUBIN URINE: NEGATIVE
HGB URINE DIPSTICK: NEGATIVE
KETONES UR: NEGATIVE
Leukocytes, UA: NEGATIVE
Nitrite: NEGATIVE
Specific Gravity, Urine: 1.02 (ref 1.000–1.030)
TOTAL PROTEIN, URINE-UPE24: NEGATIVE
URINE GLUCOSE: NEGATIVE
UROBILINOGEN UA: 0.2 (ref 0.0–1.0)
pH: 6 (ref 5.0–8.0)

## 2016-02-01 LAB — HEPATIC FUNCTION PANEL
ALBUMIN: 3.8 g/dL (ref 3.5–5.2)
ALK PHOS: 49 U/L (ref 39–117)
ALT: 13 U/L (ref 0–35)
AST: 16 U/L (ref 0–37)
BILIRUBIN DIRECT: 0 mg/dL (ref 0.0–0.3)
BILIRUBIN TOTAL: 0.4 mg/dL (ref 0.2–1.2)
Total Protein: 6.5 g/dL (ref 6.0–8.3)

## 2016-02-01 LAB — URIC ACID: URIC ACID, SERUM: 5.2 mg/dL (ref 2.4–7.0)

## 2016-02-01 LAB — BASIC METABOLIC PANEL
BUN: 18 mg/dL (ref 6–23)
CALCIUM: 9.4 mg/dL (ref 8.4–10.5)
CO2: 29 meq/L (ref 19–32)
CREATININE: 1.07 mg/dL (ref 0.40–1.20)
Chloride: 107 mEq/L (ref 96–112)
GFR: 52.4 mL/min — ABNORMAL LOW (ref >60.00)
Glucose, Bld: 115 mg/dL — ABNORMAL HIGH (ref 70–99)
Potassium: 4.3 mEq/L (ref 3.5–5.1)
Sodium: 140 mEq/L (ref 135–145)

## 2016-02-02 ENCOUNTER — Other Ambulatory Visit: Payer: Self-pay | Admitting: Internal Medicine

## 2016-02-02 MED ORDER — LEVOTHYROXINE SODIUM 25 MCG PO TABS: 25.0000 ug | ORAL_TABLET | Freq: Every day | ORAL | 11 refills | Status: DC

## 2016-02-06 ENCOUNTER — Telehealth: Payer: Self-pay | Admitting: Emergency Medicine

## 2016-02-06 DIAGNOSIS — E039 Hypothyroidism, unspecified: Secondary | ICD-10-CM

## 2016-02-06 NOTE — Telephone Encounter (Signed)
Pt informed   Notes Recorded by Cassandria Anger, MD on 02/02/2016 at 11:42 PM EDT Erline Levine,  Please inform patient that all labs are normal except for abnormal thyroid test. She needs to start Levothroid - Rx emailed. TSH in 3 mo Thx

## 2016-02-06 NOTE — Telephone Encounter (Signed)
Pt called and stated the drug store called her to pick up her prescriptions. She is unaware of what they prescriptions are for. She would like for you to call her and go over these with her. Thanks.

## 2016-03-11 ENCOUNTER — Other Ambulatory Visit: Payer: Self-pay | Admitting: Cardiovascular Disease

## 2016-03-11 ENCOUNTER — Encounter: Payer: Self-pay | Admitting: Gastroenterology

## 2016-03-11 ENCOUNTER — Other Ambulatory Visit: Payer: Self-pay | Admitting: Internal Medicine

## 2016-03-11 ENCOUNTER — Ambulatory Visit (INDEPENDENT_AMBULATORY_CARE_PROVIDER_SITE_OTHER): Payer: Medicare Other | Admitting: Gastroenterology

## 2016-03-11 VITALS — BP 148/72 | HR 80 | Ht 63.0 in | Wt 154.0 lb

## 2016-03-11 DIAGNOSIS — Z86010 Personal history of colonic polyps: Secondary | ICD-10-CM | POA: Diagnosis not present

## 2016-03-11 DIAGNOSIS — K5900 Constipation, unspecified: Secondary | ICD-10-CM

## 2016-03-11 NOTE — Patient Instructions (Addendum)
We have cancelled your recall Colonoscopies.  You have been given a high fiber diet.   Thank you for choosing me and [facility].  Pricilla Riffle. Dagoberto Ligas., MD., Marval Regal

## 2016-03-11 NOTE — Progress Notes (Signed)
    History of Present Illness: This is an 82 year old female self referred for the evaluation of a personal history of adenomatous colon polyps. Colonoscopy performed in 11/2010 for a personal history of adenomatous colon polyps showed 1 adenomatous colon polyp, diverticulosis and internal hemorrhoids. She notes occasional mild constipation which has not changed in several years. She takes an occasional Colace or milk of magnesia which promptly relieves her symptoms. Denies weight loss, abdominal pain, diarrhea, change in stool caliber, melena, hematochezia, nausea, vomiting, dysphagia, reflux symptoms, chest pain.   Review of Systems: Pertinent positive and negative review of systems were noted in the above HPI section. All other review of systems were otherwise negative.  Current Medications, Allergies, Past Medical History, Past Surgical History, Family History and Social History were reviewed in [employer] record.  Physical Exam: General: Well developed, well nourished, no acute distress Head: Normocephalic and atraumatic Eyes:  sclerae anicteric, EOMI Ears: Normal auditory acuity Mouth: No deformity or lesions Neck: Supple, no masses or thyromegaly Lungs: Clear throughout to auscultation Heart: Regular rate and rhythm; no murmurs, rubs or bruits Abdomen: Soft, non tender and non distended. No masses, hepatosplenomegaly or hernias noted. Normal Bowel sounds Musculoskeletal: Symmetrical with no gross deformities  Skin: No lesions on visible extremities Pulses:  Normal pulses noted Extremities: No clubbing, cyanosis, edema or deformities noted Neurological: Alert oriented x 4, grossly nonfocal Cervical Nodes:  No significant cervical adenopathy Inguinal Nodes: No significant inguinal adenopathy Psychological:  Alert and cooperative. Normal mood and affect  Assessment and Recommendations:  1. Personal history of adenomatous colon polyps. Last colonoscopy in 2012  had one small adenomatous colon polyp. Given her age of 57 and good report on last colonoscopy recommended not to proceed with a routine surveillance colonoscopy at this time. I did offer her the option to proceed if she preferred to have one more surveillance colonoscopy however she feels comfortable with my recommendation and was anticipating this recommendation given her age of 82.  2. Mild constipation. Increase daily water and fiber intake. Colace or milk of magnesia daily as needed. Contact us if the symptoms worsen.

## 2016-03-22 ENCOUNTER — Encounter: Payer: Self-pay | Admitting: Gynecology

## 2016-03-22 ENCOUNTER — Other Ambulatory Visit (HOSPITAL_BASED_OUTPATIENT_CLINIC_OR_DEPARTMENT_OTHER): Payer: Medicare Other

## 2016-03-22 ENCOUNTER — Ambulatory Visit: Payer: Medicare Other | Attending: Gynecology | Admitting: Gynecology

## 2016-03-22 VITALS — BP 136/64 | HR 61 | Temp 97.8°F | Resp 17 | Ht 63.0 in | Wt 152.9 lb

## 2016-03-22 DIAGNOSIS — C561 Malignant neoplasm of right ovary: Secondary | ICD-10-CM

## 2016-03-22 DIAGNOSIS — E785 Hyperlipidemia, unspecified: Secondary | ICD-10-CM | POA: Diagnosis not present

## 2016-03-22 DIAGNOSIS — Z888 Allergy status to other drugs, medicaments and biological substances status: Secondary | ICD-10-CM | POA: Insufficient documentation

## 2016-03-22 DIAGNOSIS — Z79899 Other long term (current) drug therapy: Secondary | ICD-10-CM | POA: Insufficient documentation

## 2016-03-22 DIAGNOSIS — D3911 Neoplasm of uncertain behavior of right ovary: Secondary | ICD-10-CM

## 2016-03-22 DIAGNOSIS — I2510 Atherosclerotic heart disease of native coronary artery without angina pectoris: Secondary | ICD-10-CM | POA: Insufficient documentation

## 2016-03-22 DIAGNOSIS — I6521 Occlusion and stenosis of right carotid artery: Secondary | ICD-10-CM | POA: Insufficient documentation

## 2016-03-22 DIAGNOSIS — Z7982 Long term (current) use of aspirin: Secondary | ICD-10-CM | POA: Diagnosis not present

## 2016-03-22 DIAGNOSIS — M1990 Unspecified osteoarthritis, unspecified site: Secondary | ICD-10-CM | POA: Diagnosis not present

## 2016-03-22 DIAGNOSIS — Z9889 Other specified postprocedural states: Secondary | ICD-10-CM | POA: Insufficient documentation

## 2016-03-22 DIAGNOSIS — Z8673 Personal history of transient ischemic attack (TIA), and cerebral infarction without residual deficits: Secondary | ICD-10-CM | POA: Diagnosis not present

## 2016-03-22 DIAGNOSIS — M109 Gout, unspecified: Secondary | ICD-10-CM | POA: Diagnosis not present

## 2016-03-22 DIAGNOSIS — Z9851 Tubal ligation status: Secondary | ICD-10-CM | POA: Diagnosis not present

## 2016-03-22 DIAGNOSIS — Z8249 Family history of ischemic heart disease and other diseases of the circulatory system: Secondary | ICD-10-CM | POA: Insufficient documentation

## 2016-03-22 DIAGNOSIS — I252 Old myocardial infarction: Secondary | ICD-10-CM | POA: Diagnosis not present

## 2016-03-22 DIAGNOSIS — Z90710 Acquired absence of both cervix and uterus: Secondary | ICD-10-CM | POA: Diagnosis not present

## 2016-03-22 DIAGNOSIS — F17210 Nicotine dependence, cigarettes, uncomplicated: Secondary | ICD-10-CM

## 2016-03-22 DIAGNOSIS — K439 Ventral hernia without obstruction or gangrene: Secondary | ICD-10-CM | POA: Diagnosis not present

## 2016-03-22 DIAGNOSIS — I714 Abdominal aortic aneurysm, without rupture: Secondary | ICD-10-CM | POA: Diagnosis not present

## 2016-03-22 NOTE — Patient Instructions (Signed)
Plan to have an AMH and Inhibin B level drawn today and we will call you with the results.  Plan to follow up in six months or sooner if needed.

## 2016-03-22 NOTE — Progress Notes (Signed)
Consult Note: Gyn-Onc   EMRI SAMPLE 82 y.o. female  No chief complaint on file.   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 Ventral hernia at the lower pole of her incision (relatively asymptomatic).  Plan: Inhibin B and AMH are obtained today.. She return to see me in 6 months and we will repeat tumor markers at each visit.   She is encouraged to attend the smoking cessation classes. Apparently a general surgery will not operate on her well she is still smoking.  Interval history: Patient returns today as previously scheduled. Overall she's doing well as she notes that her hernia is getting larger. It is relatively asymptomatic. She's had no evidence of bowel obstruction. She has seen a general surgeon who encouraged her to discontinue smoking before considering repair of the ventral hernia. She denies any pain or other symptoms associated with the hernia. Overall her functional status is good.. She denies any GI or GU symptoms and has no pelvic pain or pressure.  She has had some difficulty with gout and had cord is on injections recently with reasonably good results. HPI:  82 year old white female was initially seen in consultation at the request of Dr. Aloha Gell for evaluation of a newly diagnosed complex right pelvic mass.  The patient underwent a CT scan to assess an abdominal aortic aneurysm on 03/07/2014. An incidental finding was made of a 9 cm solid and cystic right adnexal mass. CT did not show any evidence of adenopathy, ascites, or any other evidence of metastatic disease. CA-125 value is 8.1 units per mL. The patient has no past gynecologic history except for undergoing a vaginal hysterectomy and previous tubal ligation. She denies any family history of ovarian cancer.  She does have significant medical comorbidities especially vascular. The aortic aneurysm measures 4.1 x 4.3 cm with extensive mural thrombus throughout the aneurysm.  Patient  underwent exploratory laparotomy and bilateral salpingo-oophorectomy on 04/26/2014. 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 She had an uncomplicated  postoperative course.  The patient was followed with serial inhibin B and AMH.  Review of Systems:10 point review of systems is negative except as noted in interval history.   Vitals: Blood pressure 136/64, pulse 61, temperature 97.8 F (36.6 C), temperature source Oral, resp. rate 17, height '5\' 3"'$  (1.6 m), weight 152 lb 14.4 oz (69.4 kg), SpO2 97 %.  Physical Exam: General : The patient is a healthy woman in no acute distress.  HEENT: normocephalic, extraoccular movements normal; neck is supple without thyromegally  Lynphnodes: Supraclavicular and inguinal nodes not enlarged  Abdomen: Soft, non-tender, no ascites, no organomegally, no masses, no hernias, midline incision is well-healed. There is an easily reducible ventral hernia the lower aspect of the midline incision. Pelvic:  EGBUS: Normal female  Vagina: Normal, no lesions  Urethra and Bladder: Normal, non-tender  Cervix: Surgically absent  Uterus: Surgically absent  Bi-manual examination: No masses  Rectal: normal sphincter tone, no masses, no blood  Lower extremities: No edema or varicosities. Normal range of motion      Allergies  Allergen Reactions  . Nabumetone Anaphylaxis and Other (See Comments)    Causes inflammation    Past Medical History:  Diagnosis Date  . Abdominal aortic aneurysm ([place])    followed by Dr. Sherren Mocha Early  . Adenomatous colon polyp 1998  . Arthritis   . Blind left eye   . CAD (coronary artery disease) 08/1995  50-55% second diag dz.; occluded OM  . Complication of anesthesia    "Blood pressure went down" with retinal surgery - no problems with previous surgeries  . Echocardiogram abnormal 03/2012   mild to mod AS, valve area1.58 cm2, mild concentric LVH, grade I diastolic dusfunction  . Gout   . Heart murmur   .  Hyperlipidemia   . Hypertension   . Myocardial infarction    mild/ age 89  . PAD (peripheral artery disease) ([facility])    mild to mod Rt ICA stenosis followed by doppler-02/05/12  . Pelvic mass   . Stroke [facility]) 2001   no residual problems  . Tobacco abuse   . Ulcer [facility])     Past Surgical History:  Procedure Laterality Date  . ABDOMINAL HYSTERECTOMY    . APPENDECTOMY    . CARDIAC CATHETERIZATION  08/1995   50-55% second diag lesion, occluded OM  . CATARACT EXTRACTION W/ INTRAOCULAR LENS  IMPLANT, BILATERAL    . COLONOSCOPY    . EYE SURGERY     lense implant removed/ blind in left eye  . LAPAROTOMY N/A 04/26/2014   Procedure: EXPLORATORY LAPAROTOMY/RESECTION OF PELVIC MASS;  Surgeon: Alvino Chapel, MD;  Location: [facility]S;  Service: Gynecology;  Laterality: N/A;  . OOPHORECTOMY     and fallopian tube  . POLYPECTOMY    . RETINAL DETACHMENT SURGERY  2006  . TONSILLECTOMY AND ADENOIDECTOMY    . TUBAL LIGATION    . UPPER GASTROINTESTINAL ENDOSCOPY     treated for h-pylori    Current Outpatient Prescriptions  Medication Sig Dispense Refill  . allopurinol (ZYLOPRIM) 100 MG tablet TAKE 1 TABLET BY MOUTH  DAILY 90 tablet 3  . amLODipine (NORVASC) 10 MG tablet TAKE 1 TABLET BY MOUTH  DAILY 90 tablet 0  . aspirin EC 81 MG tablet Take 81 mg by mouth daily.    Marland Kitchen atorvastatin (LIPITOR) 20 MG tablet TAKE 1 TABLET BY MOUTH  EVERY DAY 90 tablet 3  . chlorthalidone (HYGROTON) 25 MG tablet TAKE 1/2 TABLET BY MOUTH DAILY AS DIRECTED 15 tablet 1  . cholecalciferol (VITAMIN D) 1000 units tablet Take 1,000 Units by mouth daily.    . colchicine 0.6 MG tablet Take 1 tablet (0.6 mg total) by mouth 4 (four) times daily as needed. 360 tablet 2  . dorzolamide-timolol (COSOPT) 22.3-6.8 MG/ML ophthalmic solution Place 1 drop into the left eye daily. (Patient taking differently: Place 1 drop into the left eye daily. 2-0.5% sol 10.0  One drop Left eye daily) 10 mL 3  . estradiol (ESTRACE) 0.5 MG tablet  TAKE 1 TABLET BY MOUTH EVERY DAY 90 tablet 0  . furosemide (LASIX) 20 MG tablet TAKE 1-2 TABLETS BY MOUTH  DAILY AS NEEDED FOR EDEMA. 180 tablet 1  . levothyroxine (SYNTHROID, LEVOTHROID) 25 MCG tablet Take 1 tablet (25 mcg total) by mouth daily before breakfast. 30 tablet 11  . losartan (COZAAR) 100 MG tablet Take 1 tablet (100 mg total) by mouth daily. 90 tablet 3  . losartan (COZAAR) 100 MG tablet Take 1 tablet (100 mg total) by mouth daily. 90 tablet 0  . meloxicam (MOBIC) 15 MG tablet TAKE 1 TABLET BY MOUTH  DAILY AS NEEDED FOR PAIN 90 tablet 1  . metoprolol (LOPRESSOR) 50 MG tablet TAKE ONE-HALF TABLET BY  MOUTH TWICE A DAY 90 tablet 3  . Polyethylene Glycol 3350 POWD as needed.     Marland Kitchen tiZANidine (ZANAFLEX) 4 MG tablet Take 1 tablet (4 mg total)  by mouth every 8 (eight) hours as needed for muscle spasms. 90 tablet 2   No current facility-administered medications for this visit.     Social History   Social History  . Marital status: Widowed    Spouse name: N/A  . Number of children: 5  . Years of education: 9   Occupational History  . Oncologist     retired   Social History Main Topics  . Smoking status: Current Every Day Smoker    Packs/day: 1.00    Years: 62.00    Types: Cigarettes  . Smokeless tobacco: Never Used  . Alcohol use 0.0 oz/week     Comment: occasionally - few drinks/month  . Drug use: No  . Sexual activity: No   Other Topics Concern  . Not on file   Social History Narrative   9th grade. married '54- widowed Dec 10th'08. 4 sons - '55, '58, '60, '69; 1 daughter - '63; 12 grandchildren; 9 great-grandchildren. 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 Other children are close and attentive. ACP/End-of-life: No CPR, no mechanical ventilation, no futile/heroic measures.    Family History  Problem Relation Age of Onset  . Aneurysm Mother     AAA  . Hypertension Mother   . Hyperlipidemia Mother   . Heart  disease Father     CAD, MI x 3  . Heart attack Father   . Cancer Maternal Aunt     breast cancer  . Diabetes Sister   . Hyperlipidemia Sister   . Hypertension Sister   . Colon cancer Neg Hx   . Stomach cancer Neg Hx       Marti Sleigh, MD 03/22/2016, 1:18 PM

## 2016-03-25 ENCOUNTER — Telehealth: Payer: Self-pay

## 2016-03-25 LAB — ANTI MULLERIAN HORMONE

## 2016-03-25 NOTE — Telephone Encounter (Signed)
Orders received from [place] to contact the patient to update with her [facility] ) level -Anti-Mullerian Hormone is "normal" . Contacted the patient and updated , patient states understanding . Denies further questions or concerns at this time.

## 2016-03-26 LAB — INHIBIN B: Inhibin B: <7 pg/mL (ref 0.0–16.9)

## 2016-03-27 ENCOUNTER — Telehealth: Payer: Self-pay

## 2016-03-27 NOTE — Telephone Encounter (Signed)
Orders received from [place] to contact the patient to update with Inhibin B level being < 7.0 " WNL" Ref Range ( 0.0 - 16.9 ) . The patient was contacted and updated , patient states understanding , denies further questions at this time.

## 2016-04-05 DIAGNOSIS — S60922A Unspecified superficial injury of left hand, initial encounter: Secondary | ICD-10-CM | POA: Diagnosis not present

## 2016-04-09 ENCOUNTER — Ambulatory Visit (INDEPENDENT_AMBULATORY_CARE_PROVIDER_SITE_OTHER): Payer: Medicare Other | Admitting: Family

## 2016-04-09 ENCOUNTER — Encounter: Payer: Self-pay | Admitting: Family

## 2016-04-09 DIAGNOSIS — S61412D Laceration without foreign body of left hand, subsequent encounter: Secondary | ICD-10-CM

## 2016-04-09 DIAGNOSIS — S61419A Laceration without foreign body of unspecified hand, initial encounter: Secondary | ICD-10-CM | POA: Insufficient documentation

## 2016-04-09 NOTE — Patient Instructions (Signed)
Thank you for choosing [facility].  SUMMARY AND INSTRUCTIONS:  Let the steri-strips fall off on their own.   Clean with soap and water when the strips fall off.   May replace the Steri-strips as needed.  Continue to watch for signs of infection including redness, pain, or new discharge.   Follow up:  If your symptoms worsen or fail to improve, please contact our office for further instruction, or in case of emergency go directly to the emergency room at the closest medical facility.     Laceration Care, Adult A laceration is a cut that goes through all of the layers of the skin and into the tissue that is right under the skin. Some lacerations heal on their own. Others need to be closed with stitches (sutures), staples, skin adhesive strips, or skin glue. Proper laceration care minimizes the risk of infection and helps the laceration to heal better. HOW TO CARE FOR YOUR LACERATION If sutures or staples were used:  Keep the wound clean and dry.  If you were given a bandage (dressing), you should change it at least one time per day or as told by your health care provider. You should also change it if it becomes wet or dirty.  Keep the wound completely dry for the first 24 hours or as told by your health care provider. After that time, you may shower or bathe. However, make sure that the wound is not soaked in water until after the sutures or staples have been removed.  Clean the wound one time each day or as told by your health care provider:  Wash the wound with soap and water.  Rinse the wound with water to remove all soap.  Pat the wound dry with a clean towel. Do not rub the wound.  After cleaning the wound, apply a thin layer of antibiotic ointmentas told by your health care provider. This will help to prevent infection and keep the dressing from sticking to the wound.  Have the sutures or staples removed as told by your health care provider. If skin adhesive  strips were used:  Keep the wound clean and dry.  If you were given a bandage (dressing), you should change it at least one time per day or as told by your health care provider. You should also change it if it becomes dirty or wet.  Do not get the skin adhesive strips wet. You may shower or bathe, but be careful to keep the wound dry.  If the wound gets wet, pat it dry with a clean towel. Do not rub the wound.  Skin adhesive strips fall off on their own. You may trim the strips as the wound heals. Do not remove skin adhesive strips that are still stuck to the wound. They will fall off in time. If skin glue was used:  Try to keep the wound dry, but you may briefly wet it in the shower or bath. Do not soak the wound in water, such as by swimming.  After you have showered or bathed, gently pat the wound dry with a clean towel. Do not rub the wound.  Do not do any activities that will make you sweat heavily until the skin glue has fallen off on its own.  Do not apply liquid, cream, or ointment medicine to the wound while the skin glue is in place. Using those may loosen the film before the wound has healed.  If you were given a bandage (dressing), you should  change it at least one time per day or as told by your health care provider. You should also change it if it becomes dirty or wet.  If a dressing is placed over the wound, be careful not to apply tape directly over the skin glue. Doing that may cause the glue to be pulled off before the wound has healed.  Do not pick at the glue. The skin glue usually remains in place for 5-10 days, then it falls off of the skin. General Instructions  Take over-the-counter and prescription medicines only as told by your health care provider.  If you were prescribed an antibiotic medicine or ointment, take or apply it as told by your doctor. Do not stop using it even if your condition improves.  To help prevent scarring, make sure to cover your wound  with sunscreen whenever you are outside after stitches are removed, after adhesive strips are removed, or when glue remains in place and the wound is healed. Make sure to wear a sunscreen of at least 30 SPF.  Do not scratch or pick at the wound.  Keep all follow-up visits as told by your health care provider. This is important.  Check your wound every day for signs of infection. Watch for:  Redness, swelling, or pain.  Fluid, blood, or pus.  Raise (elevate) the injured area above the level of your heart while you are sitting or lying down, if possible. SEEK MEDICAL CARE IF:  You received a tetanus shot and you have swelling, severe pain, redness, or bleeding at the injection site.  You have a fever.  A wound that was closed breaks open.  You notice a bad smell coming from your wound or your dressing.  You notice something coming out of the wound, such as wood or glass.  Your pain is not controlled with medicine.  You have increased redness, swelling, or pain at the site of your wound.  You have fluid, blood, or pus coming from your wound.  You notice a change in the color of your skin near your wound.  You need to change the dressing frequently due to fluid, blood, or pus draining from the wound.  You develop a new rash.  You develop numbness around the wound. SEEK IMMEDIATE MEDICAL CARE IF:  You develop severe swelling around the wound.  Your pain suddenly increases and is severe.  You develop painful lumps near the wound or on skin that is anywhere on your body.  You have a red streak going away from your wound.  The wound is on your hand or foot and you cannot properly move a finger or toe.  The wound is on your hand or foot and you notice that your fingers or toes look pale or bluish.   This information is not intended to replace advice given to you by your health care provider. Make sure you discuss any questions you have with your health care provider.     Document Released: 05/20/2005 Document Revised: 10/04/2014 Document Reviewed: 05/16/2014 Elsevier Interactive Patient Education [employer].

## 2016-04-09 NOTE — Assessment & Plan Note (Signed)
Laceration of the left hand appears to be healing well with Steri-Strips and good approximation. Continue with conservative treatment. Monitor for signs of infection follow-up if symptoms worsen or if signs of infection develop

## 2016-04-09 NOTE — Progress Notes (Signed)
Subjective:    Patient ID: Tammy Brooks, female    DOB: [birth year], 82 y.o.   MRN: [id number]  Chief Complaint  Patient presents with  . Hand Injury    follow up on hand injury, slammed her right hand in the shower door    HPI:  Tammy Brooks is a 82 y.o. female who  has a past medical history of Abdominal aortic aneurysm ([place]); Adenomatous colon polyp (1998); Arthritis; Blind left eye; CAD (coronary artery disease) (08/1995); Complication of anesthesia; Echocardiogram abnormal (03/2012); Gout; Heart murmur; Hyperlipidemia; Hypertension; Myocardial infarction; PAD (peripheral artery disease) ([place]); Pelvic mass; Stroke [facility]) (2001); Tobacco abuse; and Ulcer ([place]). and presents today for an office visit.   This is a new problem. Associated symptom of pain and skin tear located on the posterior aspect of her left hand has been going for about 4 days. She was seen by Urgent Care. She was diagnosed with a skin tear that was repaired with skin glue. Since leaving Urgent Care she reports that her symptoms have been stable with no signs of infection. Denies fevers. Pain is minimal with only a little soreness. No numbness and tingling that she notes.   Allergies  Allergen Reactions  . Nabumetone Anaphylaxis and Other (See Comments)    Causes inflammation      Outpatient Medications Prior to Visit  Medication Sig Dispense Refill  . allopurinol (ZYLOPRIM) 100 MG tablet TAKE 1 TABLET BY MOUTH  DAILY 90 tablet 3  . amLODipine (NORVASC) 10 MG tablet TAKE 1 TABLET BY MOUTH  DAILY 90 tablet 0  . aspirin EC 81 MG tablet Take 81 mg by mouth daily.    Marland Kitchen atorvastatin (LIPITOR) 20 MG tablet TAKE 1 TABLET BY MOUTH  EVERY DAY 90 tablet 3  . chlorthalidone (HYGROTON) 25 MG tablet TAKE 1/2 TABLET BY MOUTH DAILY AS DIRECTED 15 tablet 1  . cholecalciferol (VITAMIN D) 1000 units tablet Take 1,000 Units by mouth daily.    . colchicine 0.6 MG tablet Take 1 tablet (0.6 mg total) by mouth 4 (four) times daily as  needed. 360 tablet 2  . dorzolamide-timolol (COSOPT) 22.3-6.8 MG/ML ophthalmic solution Place 1 drop into the left eye daily. (Patient taking differently: Place 1 drop into the left eye daily. 2-0.5% sol 10.0  One drop Left eye daily) 10 mL 3  . furosemide (LASIX) 20 MG tablet TAKE 1-2 TABLETS BY MOUTH  DAILY AS NEEDED FOR EDEMA. 180 tablet 1  . levothyroxine (SYNTHROID, LEVOTHROID) 25 MCG tablet Take 1 tablet (25 mcg total) by mouth daily before breakfast. 30 tablet 11  . losartan (COZAAR) 100 MG tablet Take 1 tablet (100 mg total) by mouth daily. 90 tablet 3  . losartan (COZAAR) 100 MG tablet Take 1 tablet (100 mg total) by mouth daily. 90 tablet 0  . meloxicam (MOBIC) 15 MG tablet TAKE 1 TABLET BY MOUTH  DAILY AS NEEDED FOR PAIN 90 tablet 1  . metoprolol (LOPRESSOR) 50 MG tablet TAKE ONE-HALF TABLET BY  MOUTH TWICE A DAY 90 tablet 3  . Polyethylene Glycol 3350 POWD as needed.     Marland Kitchen tiZANidine (ZANAFLEX) 4 MG tablet Take 1 tablet (4 mg total) by mouth every 8 (eight) hours as needed for muscle spasms. 90 tablet 2   No facility-administered medications prior to visit.       Review of Systems  Constitutional: Negative for chills and fever.  Skin: Positive for wound.      Objective:  BP (!) 150/90 (BP Location: Left Arm, Patient Position: Sitting, Cuff Size: Normal)   Pulse 70   Temp 97.7 F (36.5 C) (Oral)   Resp 16   Ht '5\' 3"'$  (1.6 m)   Wt 154 lb (69.9 kg)   SpO2 94%   BMI 27.28 kg/m  Nursing note and vital signs reviewed.  Physical Exam  Constitutional: She is oriented to person, place, and time. She appears well-developed and well-nourished. No distress.  Cardiovascular: Normal rate, regular rhythm, normal heart sounds and intact distal pulses.   Pulmonary/Chest: Effort normal and breath sounds normal.  Neurological: She is alert and oriented to person, place, and time.  Skin: Skin is warm and dry.  Left hand with discoloration and approximately 7.5 cm curvilinear  laceration that appears to be well approximated with steri-strips in place and no evidence of infection.  Psychiatric: She has a normal mood and affect. Her behavior is normal. Judgment and thought content normal.       Assessment & Plan:   Problem List Items Addressed This Visit      Other   Laceration of hand    Laceration of the left hand appears to be healing well with Steri-Strips and good approximation. Continue with conservative treatment. Monitor for signs of infection follow-up if symptoms worsen or if signs of infection develop         I am having Tammy Brooks maintain her aspirin EC, colchicine, dorzolamide-timolol, Polyethylene Glycol 3350, tiZANidine, furosemide, losartan, chlorthalidone, cholecalciferol, levothyroxine, atorvastatin, allopurinol, metoprolol, losartan, amLODipine, and meloxicam.   Follow-up: Return if symptoms worsen or fail to improve.  Mauricio Po, FNP

## 2016-05-30 ENCOUNTER — Other Ambulatory Visit: Payer: Self-pay | Admitting: Cardiovascular Disease

## 2016-05-30 NOTE — Telephone Encounter (Signed)
REFILL 

## 2016-06-25 ENCOUNTER — Encounter: Payer: Self-pay | Admitting: Family

## 2016-06-26 ENCOUNTER — Other Ambulatory Visit: Payer: Self-pay

## 2016-06-26 DIAGNOSIS — I714 Abdominal aortic aneurysm, without rupture, unspecified: Secondary | ICD-10-CM

## 2016-07-02 ENCOUNTER — Encounter: Payer: Self-pay | Admitting: Family

## 2016-07-02 ENCOUNTER — Ambulatory Visit (INDEPENDENT_AMBULATORY_CARE_PROVIDER_SITE_OTHER): Payer: Medicare Other | Admitting: Family

## 2016-07-02 ENCOUNTER — Ambulatory Visit (HOSPITAL_COMMUNITY)
Admission: RE | Admit: 2016-07-02 | Discharge: 2016-07-02 | Disposition: A | Discharge status: Home / Self Care | Payer: Medicare Other | Source: Ambulatory Visit | Attending: Family | Admitting: Family

## 2016-07-02 VITALS — BP 148/78 | HR 67 | Temp 97.5°F | Resp 18 | Ht 63.0 in | Wt 152.0 lb

## 2016-07-02 DIAGNOSIS — Z8249 Family history of ischemic heart disease and other diseases of the circulatory system: Secondary | ICD-10-CM

## 2016-07-02 DIAGNOSIS — F17200 Nicotine dependence, unspecified, uncomplicated: Secondary | ICD-10-CM

## 2016-07-02 DIAGNOSIS — I723 Aneurysm of iliac artery: Secondary | ICD-10-CM | POA: Diagnosis not present

## 2016-07-02 DIAGNOSIS — I714 Abdominal aortic aneurysm, without rupture, unspecified: Secondary | ICD-10-CM

## 2016-07-02 NOTE — Patient Instructions (Addendum)
Abdominal Aortic Aneurysm Blood pumps away from the heart through tubes (blood vessels) called arteries. Aneurysms are weak or damaged places in the wall of an artery. It bulges out like a balloon. An abdominal aortic aneurysm happens in the main artery of the body (aorta). It can burst or tear, causing bleeding inside the body. This is an emergency. It needs treatment right away. What are the causes? The exact cause is unknown. Things that could cause this problem include:  Fat and other substances building up in the lining of a tube.  Swelling of the walls of a blood vessel.  Certain tissue diseases.  Belly (abdominal) trauma.  An infection in the main artery of the body. What increases the risk? There are things that make it more likely for you to have an aneurysm. These include:  Being over the age of 81 years old.  Having high blood pressure (hypertension).  Being a female.  Being white.  Being very overweight (obese).  Having a family history of aneurysm.  Using tobacco products. What are the signs or symptoms? Symptoms depend on the size of the aneurysm and how fast it grows. There may not be symptoms. If symptoms occur, they can include:  Pain (belly, side, lower back, or groin).  Feeling full after eating a small amount of food.  Feeling sick to your stomach (nauseous), throwing up (vomiting), or both.  Feeling a lump in your belly that feels like it is beating (pulsating).  Feeling like you will pass out (faint). How is this treated?  Medicine to control blood pressure and pain.  Imaging tests to see if the aneurysm gets bigger.  Surgery. How is this prevented? To lessen your chance of getting this condition:  Stop smoking. Stop chewing tobacco.  Limit or avoid alcohol.  Keep your blood pressure, blood sugar, and cholesterol within normal limits.  Eat less salt.  Eat foods low in saturated fats and cholesterol. These are found in animal and whole  dairy products.  Eat more fiber. Fiber is found in whole grains, vegetables, and fruits.  Keep a healthy weight.  Stay active and exercise often. This information is not intended to replace advice given to you by your health care provider. Make sure you discuss any questions you have with your health care provider. Document Released: 09/14/2012 Document Revised: 10/26/2015 Document Reviewed: 06/19/2012 Elsevier Interactive Patient Education  2017 [employer].      Steps to Quit Smoking Smoking tobacco can be bad for your health. It can also affect almost every organ in your body. Smoking puts you and people around you at risk for many serious long-lasting (chronic) diseases. Quitting smoking is hard, but it is one of the best things that you can do for your health. It is never too late to quit. What are the benefits of quitting smoking? When you quit smoking, you lower your risk for getting serious diseases and conditions. They can include:  Lung cancer or lung disease.  Heart disease.  Stroke.  Heart attack.  Not being able to have children (infertility).  Weak bones (osteoporosis) and broken bones (fractures). If you have coughing, wheezing, and shortness of breath, those symptoms may get better when you quit. You may also get sick less often. If you are pregnant, quitting smoking can help to lower your chances of having a baby of low birth weight. What can I do to help me quit smoking? Talk with your doctor about what can help you quit smoking. Some  things you can do (strategies) include:  Quitting smoking totally, instead of slowly cutting back how much you smoke over a period of time.  Going to in-person counseling. You are more likely to quit if you go to many counseling sessions.  Using resources and support systems, such as:  Online chats with a Social worker.  Phone quitlines.  Printed Furniture conservator/restorer.  Support groups or group counseling.  Text messaging  programs.  Mobile phone apps or applications.  Taking medicines. Some of these medicines may have nicotine in them. If you are pregnant or breastfeeding, do not take any medicines to quit smoking unless your doctor says it is okay. Talk with your doctor about counseling or other things that can help you. Talk with your doctor about using more than one strategy at the same time, such as taking medicines while you are also going to in-person counseling. This can help make quitting easier. What things can I do to make it easier to quit? Quitting smoking might feel very hard at first, but there is a lot that you can do to make it easier. Take these steps:  Talk to your family and friends. Ask them to support and encourage you.  Call phone quitlines, reach out to support groups, or work with a Social worker.  Ask people who smoke to not smoke around you.  Avoid places that make you want (trigger) to smoke, such as:  Bars.  Parties.  Smoke-break areas at work.  Spend time with people who do not smoke.  Lower the stress in your life. Stress can make you want to smoke. Try these things to help your stress:  Getting regular exercise.  Deep-breathing exercises.  Yoga.  Meditating.  Doing a body scan. To do this, close your eyes, focus on one area of your body at a time from head to toe, and notice which parts of your body are tense. Try to relax the muscles in those areas.  Download or buy apps on your mobile phone or tablet that can help you stick to your quit plan. There are many free apps, such as QuitGuide from the [employer] Office manager for Disease Control and Prevention). You can find more support from smokefree.gov and other websites. This information is not intended to replace advice given to you by your health care provider. Make sure you discuss any questions you have with your health care provider. Document Released: 03/16/2009 Document Revised: 01/16/2016 Document Reviewed:  10/04/2014 Elsevier Interactive Patient Education  2017 [employer].       Before your next abdominal ultrasound:  Take two Extra-Strength Gas-X capsules at bedtime the night before the test. Take another two Extra-Strength Gas-X capsules 3 hours before the test.

## 2016-07-02 NOTE — Progress Notes (Signed)
[facility] [place]   CC: Follow up Abdominal Aortic Aneurysm, left CIA aneurysm  History of Present Illness  Tammy Brooks is a 83 y.o. ([birth year]) female patient of Dr. Donnetta Hutching who returns today for surveillance and discussion of AAA. At her October 2015 visit with Dr. 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 There was also a new finding of a 9 cm pelvic mass. Radiologist interpretation is concerning for ovarian malignancy. Dr. Donnetta Hutching discussed this with her internist Dr Alain Marion, who was to coordinate referral to a gyn specialist. She had a possible abdominal BSO, in November 2015 for a pelvic mass, pt states this was not cancer. Pt states she had previously had a vaginal hysterectomy. She does have a known lower abdominal hernia since this surgery and this is being followed by one of her providers, this is mildly painful. Pt states she has inflammation in her low back and both hips, has pain in these areas, denies radiculopathy type pain. Pt states her mother and maternal aunt had a AAA.  The patient denies claudication symptoms in legs with walking. The patient reports history of stroke in 2000 as manifested by syncope, from this event denies any residual neurological deficit. Pt states Dr. Gwenlyn Found regularly checks her carotid arteries by ultrasound; December 2016 carotid duplex showed <40% bilateral ICA stenosis (review of records).  She is blind in her left eye which she attributes as related to cataract surgery.  Pt states her gynecologist, Dr. Adan Sis who performed the BSO and tumor removal asked pt to ask our practice for a referal to a surgeon for repair of an abdominal hernia below the mid abdominal incision. Pt was referred to [place] [facility] for repair of LLQ abdominal hernia; pt states the surgeon told her that when she quits smoking he would repair this hernia.   Pt Diabetic: No Pt smoker: smoker  (1 ppd, started at age 60 yrs). Pt states she "can't quit".      Past Medical History:  Diagnosis Date  . Abdominal aortic aneurysm ([place])    followed by Dr. Sherren Mocha Early  . Adenomatous colon polyp 1998  . Arthritis   . Blind left eye   . CAD (coronary artery disease) 08/1995   50-55% second diag dz.; occluded OM  . Complication of anesthesia    "Blood pressure went down" with retinal surgery - no problems with previous surgeries  . Echocardiogram abnormal 03/2012   mild to mod AS, valve area1.58 cm2, mild concentric LVH, grade I diastolic dusfunction  . Gout   . Heart murmur   . Hyperlipidemia   . Hypertension   . Myocardial infarction    mild/ age 45  . PAD (peripheral artery disease) ([facility])    mild to mod Rt ICA stenosis followed by doppler-02/05/12  . Pelvic mass   . Stroke [facility]) 2001   no residual problems  . Tobacco abuse   . Ulcer [facility])    Past Surgical History:  Procedure Laterality Date  . ABDOMINAL HYSTERECTOMY    . APPENDECTOMY    . CARDIAC CATHETERIZATION  08/1995   50-55% second diag lesion, occluded OM  . CATARACT EXTRACTION W/ INTRAOCULAR LENS  IMPLANT, BILATERAL    . COLONOSCOPY    . EYE SURGERY     lense implant removed/ blind in left eye  . LAPAROTOMY N/A 04/26/2014   Procedure: EXPLORATORY LAPAROTOMY/RESECTION OF PELVIC MASS;  Surgeon: Alvino Chapel, MD;  Location: [facility];  Service: Gynecology;  Laterality: N/A;  . OOPHORECTOMY     and fallopian tube  . POLYPECTOMY    . RETINAL DETACHMENT SURGERY  2006  . TONSILLECTOMY AND ADENOIDECTOMY    . TUBAL LIGATION    . UPPER GASTROINTESTINAL ENDOSCOPY     treated for h-pylori   Social History Social History   Social History  . Marital status: Widowed    Spouse name: N/A  . Number of children: 5  . Years of education: 9   Occupational History  . Oncologist     retired   Social History Main Topics  . Smoking status: Current Every Day Smoker    Packs/day: 1.00    Years: 62.00     Types: Cigarettes  . Smokeless tobacco: Never Used  . Alcohol use 0.0 oz/week     Comment: occasionally - few drinks/month  . Drug use: No  . Sexual activity: No   Other Topics Concern  . Not on file   Social History Narrative   9th grade. married '54- widowed Dec 10th'08. 4 sons - '55, '58, '60, '69; 1 daughter - '63; 12 grandchildren; 9 great-grandchildren. 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 Other children are close and attentive. ACP/End-of-life: No CPR, no mechanical ventilation, no futile/heroic measures.   Family History Family History  Problem Relation Age of Onset  . Aneurysm Mother     AAA  . Hypertension Mother   . Hyperlipidemia Mother   . Heart disease Father     CAD, MI x 3  . Heart attack Father   . Cancer Maternal Aunt     breast cancer  . Diabetes Sister   . Hyperlipidemia Sister   . Hypertension Sister   . Colon cancer Neg Hx   . Stomach cancer Neg Hx     Current Outpatient Prescriptions on File Prior to Visit  Medication Sig Dispense Refill  . allopurinol (ZYLOPRIM) 100 MG tablet TAKE 1 TABLET BY MOUTH  DAILY 90 tablet 3  . amLODipine (NORVASC) 10 MG tablet Take 1 tablet (10 mg total) by mouth daily. KEEP OV. 90 tablet 0  . aspirin EC 81 MG tablet Take 81 mg by mouth daily.    Marland Kitchen atorvastatin (LIPITOR) 20 MG tablet TAKE 1 TABLET BY MOUTH  EVERY DAY 90 tablet 3  . chlorthalidone (HYGROTON) 25 MG tablet TAKE 1/2 TABLET BY MOUTH DAILY AS DIRECTED 15 tablet 1  . cholecalciferol (VITAMIN D) 1000 units tablet Take 1,000 Units by mouth daily.    . colchicine 0.6 MG tablet Take 1 tablet (0.6 mg total) by mouth 4 (four) times daily as needed. 360 tablet 2  . dorzolamide-timolol (COSOPT) 22.3-6.8 MG/ML ophthalmic solution Place 1 drop into the left eye daily. (Patient taking differently: Place 1 drop into the left eye daily. 2-0.5% sol 10.0  One drop Left eye daily) 10 mL 3  . furosemide (LASIX) 20 MG tablet TAKE 1-2  TABLETS BY MOUTH  DAILY AS NEEDED FOR EDEMA. 180 tablet 1  . levothyroxine (SYNTHROID, LEVOTHROID) 25 MCG tablet Take 1 tablet (25 mcg total) by mouth daily before breakfast. 30 tablet 11  . losartan (COZAAR) 100 MG tablet Take 1 tablet (100 mg total) by mouth daily. 90 tablet 3  . losartan (COZAAR) 100 MG tablet Take 1 tablet (100 mg total) by mouth daily. KEEP OV. 90 tablet 0  . meloxicam (MOBIC) 15 MG tablet TAKE 1 TABLET  BY MOUTH  DAILY AS NEEDED FOR PAIN 90 tablet 1  . metoprolol (LOPRESSOR) 50 MG tablet TAKE ONE-HALF TABLET BY  MOUTH TWICE A DAY 90 tablet 3  . Polyethylene Glycol 3350 POWD as needed.     Marland Kitchen tiZANidine (ZANAFLEX) 4 MG tablet Take 1 tablet (4 mg total) by mouth every 8 (eight) hours as needed for muscle spasms. 90 tablet 2   No current facility-administered medications on file prior to visit.    Allergies  Allergen Reactions  . Nabumetone Anaphylaxis and Other (See Comments)    Causes inflammation    ROS: See HPI for pertinent positives and negatives.  Physical Examination  Vitals:   07/02/16 0922 07/02/16 0924  BP: (!) 150/78 (!) 148/78  Pulse: 68 67  Resp:  18  Temp:  97.5 F (36.4 C)  TempSrc:  Oral  SpO2:  98%  Weight:  152 lb (68.9 kg)  Height:  '5\' 3"'$  (1.6 m)   Body mass index is 26.93 kg/m.  General: A&O x 3, WD.  Eyes: Left pupil is larger than right, is irregular in shape, and is non reactive to light.  Pulmonary: Sym exp, mild use of accessory muscles with respirations, voice is slightly hoarse, fair air movement in all fields, no rales or rhonchi, no wheezes.   Cardiac: RRR, Nl S1, S2, + murmur.   Carotid Bruits Right Left   transmitted cardiac murmur Transmitted cardiac murmur  Aorta is not palpable Radial pulses are 2+ palpable and =   VASCULAR EXAM:     LE  Pulses Right Left   FEMORAL  palpable  palpable    POPLITEAL not palpable  not palpable   POSTERIOR TIBIAL not palpable  not palpable    DORSALIS PEDIS  ANTERIOR TIBIAL faintly palpable  1+ palpable      Gastrointestinal: soft, NTND, -G/R, - HSM, - CVAT B, sagging area of tissue at low mid abdomen that pt states is a hernia, midline well healed abdominal scar noted.  Musculoskeletal: M/S 5/5 throughout, Extremities without ischemic changes.  Neurologic: CN 2-12 intact, Pain and light touch intact in extremities are intact except, Motor exam as listed above    Non-Invasive Vascular Imaging  AAA Duplex (07/02/2016)  Previous size: 4.4 cm (Date: 12-08-2015); Right CIA: 1.3 cm; Left CIA: 1.2 cm  Current size:  4.5 cm (Date: 07-02-2016) Right CIA: 1.6 cm; Left CIA: 2.4 cm, based on decreased visualization of abdominal vasculature due to overlying bowel gas and patient body habitus  Medical Decision Making  The patient is a 83 y.o. female who presents with asymptomatic AAA with no increase in size compared to six months prior, largest measured diameter today is 4.5 cm. Left CIA aneurysm measures 2.4 cm today, which is about the same as on CTA in October 2015 when it measured 2.5 cm.  But visualization on abdominal duplex today was limited due to overlying bowel gas.  I discussed today's duplex results with Dr. Donnetta Hutching   Based on this patient's exam and diagnostic studies, the patient will follow up in 6 months with the following studies: CTA abd/pelvis for evaluation of AAA and bilateral common iliac artery aneurysms.    The patient was counseled re smoking cessation and given several free resources re smoking cessation.   Consideration for repair of AAA would be made when the size is 5.0 cm, growth > 1 cm/yr, and symptomatic status.       Consideration for repair of common iliac artery aneurysm would be  made when the size  is 3.0 cm.   I emphasized the importance of maximal medical management including strict control of blood pressure, blood glucose, and lipid levels, antiplatelet agents, obtaining regular exercise, and cessation of smoking.   The patient was given information about AAA including signs, symptoms, treatment, and how to minimize the risk of enlargement and rupture of aneurysms.    The patient was advised to call 911 should the patient experience sudden onset abdominal or back pain.   Thank you for allowing [place] to participate in this patient's care.  Clemon Chambers, RN, MSN, FNP-C [facility] [place] Office: [phone]  Clinic Physician: Early  07/02/2016, 9:26 AM

## 2016-07-03 ENCOUNTER — Other Ambulatory Visit: Payer: Self-pay | Admitting: Internal Medicine

## 2016-07-04 ENCOUNTER — Other Ambulatory Visit: Payer: Self-pay

## 2016-07-04 DIAGNOSIS — I714 Abdominal aortic aneurysm, without rupture, unspecified: Secondary | ICD-10-CM

## 2016-07-04 DIAGNOSIS — I723 Aneurysm of iliac artery: Secondary | ICD-10-CM

## 2016-07-04 NOTE — Addendum Note (Signed)
Addended by: Lianne Cure A on: 07/04/2016 01:55 PM   Modules accepted: Orders

## 2016-07-17 ENCOUNTER — Other Ambulatory Visit (INDEPENDENT_AMBULATORY_CARE_PROVIDER_SITE_OTHER): Payer: Medicare Other

## 2016-07-17 ENCOUNTER — Encounter: Payer: Self-pay | Admitting: Internal Medicine

## 2016-07-17 ENCOUNTER — Ambulatory Visit (INDEPENDENT_AMBULATORY_CARE_PROVIDER_SITE_OTHER): Payer: Medicare Other | Admitting: Internal Medicine

## 2016-07-17 VITALS — BP 180/100 | HR 62 | Temp 97.7°F | Resp 16 | Ht 63.0 in | Wt 153.5 lb

## 2016-07-17 DIAGNOSIS — I10 Essential (primary) hypertension: Secondary | ICD-10-CM | POA: Diagnosis not present

## 2016-07-17 DIAGNOSIS — R946 Abnormal results of thyroid function studies: Secondary | ICD-10-CM

## 2016-07-17 DIAGNOSIS — R7989 Other specified abnormal findings of blood chemistry: Secondary | ICD-10-CM

## 2016-07-17 DIAGNOSIS — F17200 Nicotine dependence, unspecified, uncomplicated: Secondary | ICD-10-CM

## 2016-07-17 DIAGNOSIS — M5440 Lumbago with sciatica, unspecified side: Secondary | ICD-10-CM | POA: Diagnosis not present

## 2016-07-17 DIAGNOSIS — E785 Hyperlipidemia, unspecified: Secondary | ICD-10-CM | POA: Diagnosis not present

## 2016-07-17 DIAGNOSIS — G8929 Other chronic pain: Secondary | ICD-10-CM

## 2016-07-17 LAB — TSH: TSH: 5.04 u[IU]/mL — ABNORMAL HIGH (ref 0.35–4.50)

## 2016-07-17 LAB — BASIC METABOLIC PANEL
BUN: 17 mg/dL (ref 6–23)
CALCIUM: 9.9 mg/dL (ref 8.4–10.5)
CO2: 30 meq/L (ref 19–32)
Chloride: 106 mEq/L (ref 96–112)
Creatinine, Ser: 0.99 mg/dL (ref 0.40–1.20)
GFR: 57.25 mL/min — ABNORMAL LOW (ref >60.00)
GLUCOSE: 111 mg/dL — AB (ref 70–99)
Potassium: 4.3 mEq/L (ref 3.5–5.1)
SODIUM: 138 meq/L (ref 135–145)

## 2016-07-17 LAB — T4, FREE: Free T4: 0.82 ng/dL (ref 0.60–1.60)

## 2016-07-17 MED ORDER — VALSARTAN 160 MG PO TABS: 160.0000 mg | ORAL_TABLET | Freq: Two times a day (BID) | ORAL | 1 refills | Status: DC

## 2016-07-17 MED ORDER — VALSARTAN 160 MG PO TABS: 160.0000 mg | ORAL_TABLET | Freq: Every day | ORAL | 3 refills | Status: DC

## 2016-07-17 NOTE — Progress Notes (Signed)
Pre-visit discussion using our clinic review tool. No additional management support is needed unless otherwise documented below in the visit note.  

## 2016-07-17 NOTE — Patient Instructions (Addendum)
Stop Meloxicam Stop Losartan and start Valsartan (Diovan) instead

## 2016-07-17 NOTE — Progress Notes (Signed)
Subjective:  Patient ID: Tammy Brooks, female    DOB: [birth year]  Age: 83 y.o. MRN: [id number]  CC: Follow-up (LBP, Thyroid, AAA, Gout, HTN, Hyperlipedimea, ) and Sinus Problem   HPI Tammy Brooks presents for HTN, gout, dyslipidemia C/o B feet pain 1st MTPs/bunions  Outpatient Medications Prior to Visit  Medication Sig Dispense Refill  . allopurinol (ZYLOPRIM) 100 MG tablet TAKE 1 TABLET BY MOUTH  DAILY 90 tablet 3  . amLODipine (NORVASC) 10 MG tablet Take 1 tablet (10 mg total) by mouth daily. KEEP OV. 90 tablet 0  . aspirin EC 81 MG tablet Take 81 mg by mouth daily.    Marland Kitchen atorvastatin (LIPITOR) 20 MG tablet TAKE 1 TABLET BY MOUTH  EVERY DAY 90 tablet 3  . chlorthalidone (HYGROTON) 25 MG tablet TAKE 1/2 TABLET BY MOUTH DAILY AS DIRECTED 15 tablet 1  . cholecalciferol (VITAMIN D) 1000 units tablet Take 1,000 Units by mouth daily.    . colchicine 0.6 MG tablet Take 1 tablet (0.6 mg total) by mouth 4 (four) times daily as needed. 360 tablet 2  . dorzolamide-timolol (COSOPT) 22.3-6.8 MG/ML ophthalmic solution Place 1 drop into the left eye daily. (Patient taking differently: Place 1 drop into the left eye daily. 2-0.5% sol 10.0  One drop Left eye daily) 10 mL 3  . furosemide (LASIX) 20 MG tablet TAKE 1-2 TABLETS BY MOUTH  DAILY AS NEEDED FOR EDEMA. 180 tablet 1  . levothyroxine (SYNTHROID, LEVOTHROID) 25 MCG tablet Take 1 tablet (25 mcg total) by mouth daily before breakfast. 30 tablet 11  . losartan (COZAAR) 100 MG tablet Take 1 tablet (100 mg total) by mouth daily. 90 tablet 3  . meloxicam (MOBIC) 15 MG tablet TAKE 1 TABLET BY MOUTH  DAILY AS NEEDED FOR PAIN 90 tablet 0  . metoprolol (LOPRESSOR) 50 MG tablet TAKE ONE-HALF TABLET BY  MOUTH TWICE A DAY 90 tablet 3  . Polyethylene Glycol 3350 POWD as needed.     Marland Kitchen tiZANidine (ZANAFLEX) 4 MG tablet Take 1 tablet (4 mg total) by mouth every 8 (eight) hours as needed for muscle spasms. 90 tablet 2   No facility-administered medications prior to  visit.     ROS Review of Systems  Constitutional: Positive for fatigue. Negative for activity change, appetite change, chills and unexpected weight change.  HENT: Negative for congestion, mouth sores and sinus pressure.   Eyes: Negative for visual disturbance.  Respiratory: Negative for cough, chest tightness and shortness of breath.   Cardiovascular: Negative for chest pain and leg swelling.  Gastrointestinal: Negative for abdominal pain and nausea.  Genitourinary: Negative for difficulty urinating, frequency and vaginal pain.  Musculoskeletal: Positive for arthralgias and back pain. Negative for gait problem.  Skin: Negative for pallor and rash.  Neurological: Negative for dizziness, tremors, weakness, numbness and headaches.  Psychiatric/Behavioral: Negative for confusion, sleep disturbance and suicidal ideas. The patient is not nervous/anxious.     Objective:  BP (!) 200/102   Pulse 62   Temp 97.7 F (36.5 C) (Oral)   Resp 16   Ht '5\' 3"'$  (1.6 m)   Wt 153 lb 8 oz (69.6 kg)   SpO2 97%   BMI 27.19 kg/m   BP Readings from Last 3 Encounters:  07/17/16 (!) 200/102  07/02/16 (!) 148/78  04/09/16 (!) 150/90    Wt Readings from Last 3 Encounters:  07/17/16 153 lb 8 oz (69.6 kg)  07/02/16 152 lb (68.9 kg)  04/09/16 154 lb (69.9 kg)  Physical Exam  Constitutional: She appears well-developed. No distress.  HENT:  Head: Normocephalic.  Right Ear: External ear normal.  Left Ear: External ear normal.  Nose: Nose normal.  Mouth/Throat: Oropharynx is clear and moist.  Eyes: Conjunctivae are normal. Pupils are equal, round, and reactive to light. Right eye exhibits no discharge. Left eye exhibits no discharge.  Neck: Normal range of motion. Neck supple. No JVD present. No tracheal deviation present. No thyromegaly present.  Cardiovascular: Normal rate, regular rhythm and normal heart sounds.   Pulmonary/Chest: No stridor. No respiratory distress. She has no wheezes.    Abdominal: Soft. Bowel sounds are normal. She exhibits no distension and no mass. There is no tenderness. There is no rebound and no guarding.  Musculoskeletal: She exhibits tenderness. She exhibits no edema.  Lymphadenopathy:    She has no cervical adenopathy.  Neurological: She displays normal reflexes. No cranial nerve deficit. She exhibits normal muscle tone. Coordination normal.  Skin: No rash noted. No erythema.  Psychiatric: She has a normal mood and affect. Her behavior is normal. Judgment and thought content normal.   B 1st MTPs tender   Lab Results  Component Value Date   WBC 8.9 02/01/2016   HGB 14.1 02/01/2016   HCT 41.5 02/01/2016   PLT 208.0 02/01/2016   GLUCOSE 115 (H) 02/01/2016   CHOL 143 02/01/2016   TRIG 141.0 02/01/2016   HDL 31.60 (L) 02/01/2016   LDLDIRECT 76.7 11/17/2008   LDLCALC 83 02/01/2016   ALT 13 02/01/2016   AST 16 02/01/2016   NA 140 02/01/2016   K 4.3 02/01/2016   CL 107 02/01/2016   CREATININE 1.07 02/01/2016   BUN 18 02/01/2016   CO2 29 02/01/2016   TSH 7.63 (H) 02/01/2016    No results found.  Assessment & Plan:   There are no diagnoses linked to this encounter. I am having Ms. Petz maintain her aspirin EC, colchicine, dorzolamide-timolol, Polyethylene Glycol 3350, tiZANidine, furosemide, losartan, chlorthalidone, cholecalciferol, levothyroxine, atorvastatin, allopurinol, metoprolol, amLODipine, and meloxicam.  No orders of the defined types were placed in this encounter.    Follow-up: No Follow-up on file.  Walker Kehr, MD

## 2016-07-18 ENCOUNTER — Telehealth: Payer: Self-pay

## 2016-07-18 NOTE — Telephone Encounter (Signed)
lmom for patient to call back 

## 2016-07-18 NOTE — Telephone Encounter (Signed)
-----   Message from Cassandria Anger, MD sent at 07/17/2016 11:47 PM EST ----- Jonelle Sidle,  Please inform patient that all labs are normal except for abn TSH. However, FT4 is nl: no change in Levothroid dose. Keep ROV. Thx

## 2016-07-18 NOTE — Telephone Encounter (Signed)
Routing to [place], please handle, thanks

## 2016-07-19 ENCOUNTER — Telehealth: Payer: Self-pay | Admitting: Internal Medicine

## 2016-07-19 NOTE — Telephone Encounter (Signed)
Pt called stating she is concern about her BP beng low after taking 2 BP med as Dr. Camila Li directed. Some of reading were 140/74,126/59,110/69 and 100/60. Offer an appt to [facility] clinic for BP evaluation,pt decline. Transfer to [facility] as requested. FYI

## 2016-07-19 NOTE — Telephone Encounter (Signed)
Routing to dr plotnikov---do you want patient to schedule office visit with you-or do you just want to make some med changes for bp mgnt---please advise, thanks

## 2016-07-21 NOTE — Telephone Encounter (Signed)
The BP values listed are good. No change in meds. Keep ROV Thx

## 2016-07-31 DIAGNOSIS — H44512 Absolute glaucoma, left eye: Secondary | ICD-10-CM | POA: Diagnosis not present

## 2016-07-31 DIAGNOSIS — H548 Legal blindness, as defined in USA: Secondary | ICD-10-CM | POA: Diagnosis not present

## 2016-08-01 ENCOUNTER — Encounter: Payer: Self-pay | Admitting: Internal Medicine

## 2016-08-01 ENCOUNTER — Ambulatory Visit (INDEPENDENT_AMBULATORY_CARE_PROVIDER_SITE_OTHER): Payer: Medicare Other | Admitting: Internal Medicine

## 2016-08-01 DIAGNOSIS — I739 Peripheral vascular disease, unspecified: Secondary | ICD-10-CM

## 2016-08-01 DIAGNOSIS — I2510 Atherosclerotic heart disease of native coronary artery without angina pectoris: Secondary | ICD-10-CM | POA: Diagnosis not present

## 2016-08-01 DIAGNOSIS — M5440 Lumbago with sciatica, unspecified side: Secondary | ICD-10-CM | POA: Diagnosis not present

## 2016-08-01 DIAGNOSIS — I10 Essential (primary) hypertension: Secondary | ICD-10-CM

## 2016-08-01 DIAGNOSIS — G8929 Other chronic pain: Secondary | ICD-10-CM

## 2016-08-01 MED ORDER — VALSARTAN 160 MG PO TABS: 160.0000 mg | ORAL_TABLET | Freq: Two times a day (BID) | ORAL | 3 refills | Status: DC

## 2016-08-01 MED ORDER — TRAMADOL HCL 50 MG PO TABS: 50.0000 mg | ORAL_TABLET | Freq: Three times a day (TID) | ORAL | 2 refills | Status: DC | PRN

## 2016-08-01 NOTE — Assessment & Plan Note (Signed)
BP meds are being adjusted

## 2016-08-01 NOTE — Assessment & Plan Note (Signed)
Needs to d/c smoking 

## 2016-08-01 NOTE — Assessment & Plan Note (Signed)
Worse off Meloxicam Tramadol prn  Potential benefits of a long term opioids use as well as potential risks (i.e. addiction risk, apnea etc) and complications (i.e. Somnolence, constipation and others) were explained to the patient and were aknowledged.

## 2016-08-01 NOTE — Assessment & Plan Note (Signed)
Increase Valsartan to 160 mg bid

## 2016-08-01 NOTE — Assessment & Plan Note (Signed)
Simvastatin

## 2016-08-01 NOTE — Progress Notes (Signed)
Pre visit review using our clinic review tool, if applicable. No additional management support is needed unless otherwise documented below in the visit note. 

## 2016-08-01 NOTE — Assessment & Plan Note (Signed)
Will need to lower BP

## 2016-08-01 NOTE — Progress Notes (Signed)
Subjective:  Patient ID: Tammy Brooks, female    DOB: 1934/04/02  Age: 83 y.o. MRN: [id number]  CC: No chief complaint on file.   HPI Tammy Brooks presents for HTN, dyslipidemia, COPD f/u  Outpatient Medications Prior to Visit  Medication Sig Dispense Refill  . allopurinol (ZYLOPRIM) 100 MG tablet TAKE 1 TABLET BY MOUTH  DAILY 90 tablet 3  . amLODipine (NORVASC) 10 MG tablet Take 1 tablet (10 mg total) by mouth daily. KEEP OV. 90 tablet 0  . aspirin EC 81 MG tablet Take 81 mg by mouth daily.    Marland Kitchen atorvastatin (LIPITOR) 20 MG tablet TAKE 1 TABLET BY MOUTH  EVERY DAY 90 tablet 3  . chlorthalidone (HYGROTON) 25 MG tablet TAKE 1/2 TABLET BY MOUTH DAILY AS DIRECTED 15 tablet 1  . cholecalciferol (VITAMIN D) 1000 units tablet Take 1,000 Units by mouth daily.    . colchicine 0.6 MG tablet Take 1 tablet (0.6 mg total) by mouth 4 (four) times daily as needed. 360 tablet 2  . dorzolamide-timolol (COSOPT) 22.3-6.8 MG/ML ophthalmic solution Place 1 drop into the left eye daily. (Patient taking differently: Place 1 drop into the left eye daily. 2-0.5% sol 10.0  One drop Left eye daily) 10 mL 3  . furosemide (LASIX) 20 MG tablet TAKE 1-2 TABLETS BY MOUTH  DAILY AS NEEDED FOR EDEMA. 180 tablet 1  . levothyroxine (SYNTHROID, LEVOTHROID) 25 MCG tablet Take 1 tablet (25 mcg total) by mouth daily before breakfast. 30 tablet 11  . metoprolol (LOPRESSOR) 50 MG tablet TAKE ONE-HALF TABLET BY  MOUTH TWICE A DAY 90 tablet 3  . Polyethylene Glycol 3350 POWD as needed.     Marland Kitchen tiZANidine (ZANAFLEX) 4 MG tablet Take 1 tablet (4 mg total) by mouth every 8 (eight) hours as needed for muscle spasms. 90 tablet 2  . valsartan (DIOVAN) 160 MG tablet Take 1 tablet (160 mg total) by mouth 2 (two) times daily. 60 tablet 1  . valsartan (DIOVAN) 160 MG tablet Take 1 tablet (160 mg total) by mouth daily. 180 tablet 3   No facility-administered medications prior to visit.     ROS Review of Systems  Constitutional: Negative  for activity change, appetite change, chills, fatigue and unexpected weight change.  HENT: Negative for congestion, mouth sores and sinus pressure.   Eyes: Negative for visual disturbance.  Respiratory: Negative for cough and chest tightness.   Gastrointestinal: Negative for abdominal pain and nausea.  Genitourinary: Negative for difficulty urinating, frequency and vaginal pain.  Musculoskeletal: Positive for arthralgias and back pain. Negative for gait problem.  Skin: Negative for pallor and rash.  Neurological: Negative for dizziness, tremors, weakness, numbness and headaches.  Psychiatric/Behavioral: Negative for confusion and sleep disturbance.    Objective:  BP (!) 150/78 (BP Location: Left Arm, Patient Position: Sitting, Cuff Size: Normal)   Pulse (!) 58   Temp 97.7 F (36.5 C) (Oral)   Ht '5\' 3"'$  (1.6 m)   Wt 151 lb 1.3 oz (68.5 kg)   SpO2 94%   BMI 26.76 kg/m   BP Readings from Last 3 Encounters:  08/01/16 (!) 150/78  07/17/16 (!) 180/100  07/02/16 (!) 148/78    Wt Readings from Last 3 Encounters:  08/01/16 151 lb 1.3 oz (68.5 kg)  07/17/16 153 lb 8 oz (69.6 kg)  07/02/16 152 lb (68.9 kg)    Physical Exam  Constitutional: She appears well-developed. No distress.  HENT:  Head: Normocephalic.  Right Ear: External ear normal.  Left Ear: External ear normal.  Nose: Nose normal.  Mouth/Throat: Oropharynx is clear and moist.  Eyes: Conjunctivae are normal. Pupils are equal, round, and reactive to light. Right eye exhibits no discharge. Left eye exhibits no discharge.  Neck: Normal range of motion. Neck supple. No JVD present. No tracheal deviation present. No thyromegaly present.  Cardiovascular: Normal rate, regular rhythm and normal heart sounds.   Pulmonary/Chest: No stridor. No respiratory distress. She has no wheezes.  Abdominal: Soft. Bowel sounds are normal. She exhibits no distension and no mass. There is no tenderness. There is no rebound and no guarding.    Musculoskeletal: She exhibits tenderness. She exhibits no edema.  Lymphadenopathy:    She has no cervical adenopathy.  Neurological: She displays normal reflexes. No cranial nerve deficit. She exhibits normal muscle tone. Coordination normal.  Skin: No rash noted. No erythema.  Psychiatric: She has a normal mood and affect. Her behavior is normal. Judgment and thought content normal.   AKs  Lab Results  Component Value Date   WBC 8.9 02/01/2016   HGB 14.1 02/01/2016   HCT 41.5 02/01/2016   PLT 208.0 02/01/2016   GLUCOSE 111 (H) 07/17/2016   CHOL 143 02/01/2016   TRIG 141.0 02/01/2016   HDL 31.60 (L) 02/01/2016   LDLDIRECT 76.7 11/17/2008   LDLCALC 83 02/01/2016   ALT 13 02/01/2016   AST 16 02/01/2016   NA 138 07/17/2016   K 4.3 07/17/2016   CL 106 07/17/2016   CREATININE 0.99 07/17/2016   BUN 17 07/17/2016   CO2 30 07/17/2016   TSH 5.04 (H) 07/17/2016    No results found.  Assessment & Plan:   There are no diagnoses linked to this encounter. I am having Tammy Brooks maintain her aspirin EC, colchicine, dorzolamide-timolol, Polyethylene Glycol 3350, tiZANidine, furosemide, chlorthalidone, cholecalciferol, levothyroxine, atorvastatin, allopurinol, metoprolol, amLODipine, valsartan, and valsartan.  No orders of the defined types were placed in this encounter.    Follow-up: No Follow-up on file.  Walker Kehr, MD

## 2016-08-01 NOTE — Assessment & Plan Note (Signed)
Discussed.

## 2016-08-01 NOTE — Assessment & Plan Note (Addendum)
Severe  Stop Meloxicam Stop Losartan and start Valsartan (Diovan) instead

## 2016-08-21 ENCOUNTER — Other Ambulatory Visit: Payer: Self-pay | Admitting: Cardiovascular Disease

## 2016-09-06 ENCOUNTER — Ambulatory Visit: Payer: Medicare Other | Attending: Gynecology | Admitting: Gynecology

## 2016-09-06 ENCOUNTER — Encounter: Payer: Self-pay | Admitting: Gynecology

## 2016-09-06 ENCOUNTER — Other Ambulatory Visit (HOSPITAL_BASED_OUTPATIENT_CLINIC_OR_DEPARTMENT_OTHER): Payer: Medicare Other

## 2016-09-06 VITALS — BP 153/63 | HR 54 | Temp 97.7°F | Resp 20 | Ht 63.0 in | Wt 149.6 lb

## 2016-09-06 DIAGNOSIS — Z833 Family history of diabetes mellitus: Secondary | ICD-10-CM | POA: Diagnosis not present

## 2016-09-06 DIAGNOSIS — F17210 Nicotine dependence, cigarettes, uncomplicated: Secondary | ICD-10-CM | POA: Diagnosis not present

## 2016-09-06 DIAGNOSIS — K432 Incisional hernia without obstruction or gangrene: Secondary | ICD-10-CM

## 2016-09-06 DIAGNOSIS — Z7982 Long term (current) use of aspirin: Secondary | ICD-10-CM | POA: Insufficient documentation

## 2016-09-06 DIAGNOSIS — C561 Malignant neoplasm of right ovary: Secondary | ICD-10-CM

## 2016-09-06 DIAGNOSIS — Z888 Allergy status to other drugs, medicaments and biological substances status: Secondary | ICD-10-CM | POA: Diagnosis not present

## 2016-09-06 DIAGNOSIS — Z79899 Other long term (current) drug therapy: Secondary | ICD-10-CM | POA: Insufficient documentation

## 2016-09-06 DIAGNOSIS — Z803 Family history of malignant neoplasm of breast: Secondary | ICD-10-CM | POA: Insufficient documentation

## 2016-09-06 DIAGNOSIS — Z9851 Tubal ligation status: Secondary | ICD-10-CM | POA: Diagnosis not present

## 2016-09-06 DIAGNOSIS — Z9889 Other specified postprocedural states: Secondary | ICD-10-CM | POA: Diagnosis not present

## 2016-09-06 DIAGNOSIS — Z8249 Family history of ischemic heart disease and other diseases of the circulatory system: Secondary | ICD-10-CM | POA: Diagnosis not present

## 2016-09-06 DIAGNOSIS — H5462 Unqualified visual loss, left eye, normal vision right eye: Secondary | ICD-10-CM | POA: Diagnosis not present

## 2016-09-06 DIAGNOSIS — I2510 Atherosclerotic heart disease of native coronary artery without angina pectoris: Secondary | ICD-10-CM | POA: Insufficient documentation

## 2016-09-06 DIAGNOSIS — E785 Hyperlipidemia, unspecified: Secondary | ICD-10-CM | POA: Diagnosis not present

## 2016-09-06 DIAGNOSIS — Z8673 Personal history of transient ischemic attack (TIA), and cerebral infarction without residual deficits: Secondary | ICD-10-CM | POA: Insufficient documentation

## 2016-09-06 DIAGNOSIS — K439 Ventral hernia without obstruction or gangrene: Secondary | ICD-10-CM | POA: Diagnosis not present

## 2016-09-06 DIAGNOSIS — Z90710 Acquired absence of both cervix and uterus: Secondary | ICD-10-CM | POA: Insufficient documentation

## 2016-09-06 DIAGNOSIS — M109 Gout, unspecified: Secondary | ICD-10-CM | POA: Diagnosis not present

## 2016-09-06 DIAGNOSIS — D3911 Neoplasm of uncertain behavior of right ovary: Secondary | ICD-10-CM

## 2016-09-06 DIAGNOSIS — I10 Essential (primary) hypertension: Secondary | ICD-10-CM | POA: Diagnosis not present

## 2016-09-06 DIAGNOSIS — C569 Malignant neoplasm of unspecified ovary: Secondary | ICD-10-CM | POA: Insufficient documentation

## 2016-09-06 DIAGNOSIS — I252 Old myocardial infarction: Secondary | ICD-10-CM | POA: Insufficient documentation

## 2016-09-06 NOTE — Progress Notes (Signed)
Consult Note: Gyn-Onc   Tammy Brooks 83 y.o. female  Chief Complaint  Patient presents with  . 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 Ventral hernia at the lower pole of her incision (relatively asymptomatic).  Plan: Inhibin B and AMH are obtained today.. She return to see me in 6 months and we will repeat tumor markers at each visit.  Interval history: Patient returns today as previously scheduled. Overall she's doing well as she notes that her hernia is getting larger. It is relatively asymptomatic. She's had no evidence of bowel obstruction. She has seen a general surgeon who encouraged her to discontinue smoking before considering repair of the ventral hernia. She denies any pain or other symptoms associated with the hernia. Overall her functional status is good.. She denies any GI or GU symptoms and has no pelvic pain or pressure. HPI:  83 year old white female was initially seen in consultation at the request of Dr. Aloha Gell for evaluation of a newly diagnosed complex right pelvic mass.  The patient underwent a CT scan to assess an abdominal aortic aneurysm on 03/07/2014. An incidental finding was made of a 9 cm solid and cystic right adnexal mass. CT did not show any evidence of adenopathy, ascites, or any other evidence of metastatic disease. CA-125 value is 8.1 units per mL. The patient has no past gynecologic history except for undergoing a vaginal hysterectomy and previous tubal ligation. She denies any family history of ovarian cancer.  She does have significant medical comorbidities especially vascular. The aortic aneurysm measures 4.1 x 4.3 cm with extensive mural thrombus throughout the aneurysm.  Patient underwent exploratory laparotomy and bilateral salpingo-oophorectomy on 04/26/2014. 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 She had an uncomplicated  postoperative course.  The  patient was followed with serial inhibin B and AMH.  Review of Systems:10 point review of systems is negative except as noted in interval history.   Vitals: Blood pressure (!) 153/63, pulse (!) 54, temperature 97.7 F (36.5 C), temperature source Oral, resp. rate 20, height '5\' 3"'$  (1.6 m), weight 149 lb 9.6 oz (67.9 kg), SpO2 97 %.  Physical Exam: General : The patient is a healthy woman in no acute distress.  HEENT: normocephalic, extraoccular movements normal; neck is supple without thyromegally  Lynphnodes: Supraclavicular and inguinal nodes not enlarged  Abdomen: Soft, non-tender, no ascites, no organomegally, no masses, no hernias, midline incision is well-healed. There is an easily reducible ventral hernia the lower aspect of the midline incision. Pelvic:  EGBUS: Normal female  Vagina: Normal, no lesions  Urethra and Bladder: Normal, non-tender  Cervix: Surgically absent  Uterus: Surgically absent  Bi-manual examination: No masses  Rectal: normal sphincter tone, no masses, no blood  Lower extremities: No edema or varicosities. Normal range of motion      Allergies  Allergen Reactions  . Nabumetone Anaphylaxis and Other (See Comments)    Causes inflammation    Past Medical History:  Diagnosis Date  . Abdominal aortic aneurysm ([place])    followed by Dr. Sherren Mocha Early  . Adenomatous colon polyp 1998  . Arthritis   . Blind left eye   . CAD (coronary artery disease) 08/1995   50-55% second diag dz.; occluded OM  . Complication of anesthesia    "Blood pressure went down" with retinal surgery - no problems with previous surgeries  . Echocardiogram abnormal 03/2012   mild  to mod AS, valve area1.58 cm2, mild concentric LVH, grade I diastolic dusfunction  . Gout   . Heart murmur   . Hyperlipidemia   . Hypertension   . Myocardial infarction    mild/ age 44  . PAD (peripheral artery disease) ([facility])    mild to mod Rt ICA stenosis followed by doppler-02/05/12  . Pelvic mass   .  Stroke [facility]) 2001   no residual problems  . Tobacco abuse   . Ulcer [facility])     Past Surgical History:  Procedure Laterality Date  . ABDOMINAL HYSTERECTOMY    . APPENDECTOMY    . CARDIAC CATHETERIZATION  08/1995   50-55% second diag lesion, occluded OM  . CATARACT EXTRACTION W/ INTRAOCULAR LENS  IMPLANT, BILATERAL    . COLONOSCOPY    . EYE SURGERY     lense implant removed/ blind in left eye  . LAPAROTOMY N/A 04/26/2014   Procedure: EXPLORATORY LAPAROTOMY/RESECTION OF PELVIC MASS;  Surgeon: Alvino Chapel, MD;  Location: [facility];  Service: Gynecology;  Laterality: N/A;  . OOPHORECTOMY     and fallopian tube  . POLYPECTOMY    . RETINAL DETACHMENT SURGERY  2006  . TONSILLECTOMY AND ADENOIDECTOMY    . TUBAL LIGATION    . UPPER GASTROINTESTINAL ENDOSCOPY     treated for h-pylori    Current Outpatient Prescriptions  Medication Sig Dispense Refill  . allopurinol (ZYLOPRIM) 100 MG tablet TAKE 1 TABLET BY MOUTH  DAILY 90 tablet 3  . amLODipine (NORVASC) 10 MG tablet TAKE 1 TABLET BY MOUTH  DAILY 30 tablet 0  . aspirin EC 81 MG tablet Take 81 mg by mouth daily.    Marland Kitchen atorvastatin (LIPITOR) 20 MG tablet TAKE 1 TABLET BY MOUTH  EVERY DAY 90 tablet 3  . chlorthalidone (HYGROTON) 25 MG tablet TAKE 1/2 TABLET BY MOUTH DAILY AS DIRECTED 15 tablet 1  . cholecalciferol (VITAMIN D) 1000 units tablet Take 1,000 Units by mouth daily.    . colchicine 0.6 MG tablet Take 1 tablet (0.6 mg total) by mouth 4 (four) times daily as needed. 360 tablet 2  . dorzolamide-timolol (COSOPT) 22.3-6.8 MG/ML ophthalmic solution Place 1 drop into the left eye daily. (Patient taking differently: Place 1 drop into the left eye daily. 2-0.5% sol 10.0  One drop Left eye daily) 10 mL 3  . furosemide (LASIX) 20 MG tablet TAKE 1-2 TABLETS BY MOUTH  DAILY AS NEEDED FOR EDEMA. 180 tablet 1  . levothyroxine (SYNTHROID, LEVOTHROID) 25 MCG tablet Take 1 tablet (25 mcg total) by mouth daily before breakfast. 30 tablet 11  .  metoprolol (LOPRESSOR) 50 MG tablet TAKE ONE-HALF TABLET BY  MOUTH TWICE A DAY 90 tablet 3  . Polyethylene Glycol 3350 POWD as needed.     Marland Kitchen tiZANidine (ZANAFLEX) 4 MG tablet Take 1 tablet (4 mg total) by mouth every 8 (eight) hours as needed for muscle spasms. 90 tablet 2  . traMADol (ULTRAM) 50 MG tablet Take 1 tablet (50 mg total) by mouth every 8 (eight) hours as needed. 100 tablet 2  . valsartan (DIOVAN) 160 MG tablet Take 160 mg by mouth.     No current facility-administered medications for this visit.     Social History   Social History  . Marital status: Widowed    Spouse name: N/A  . Number of children: 5  . Years of education: 9   Occupational History  . Oncologist     retired   Science writer History  Main Topics  . Smoking status: Current Every Day Smoker    Packs/day: 1.00    Years: 62.00    Types: Cigarettes  . Smokeless tobacco: Never Used  . Alcohol use 0.0 oz/week     Comment: occasionally - few drinks/month  . Drug use: No  . Sexual activity: No   Other Topics Concern  . Not on file   Social History Narrative   9th grade. married '54- widowed Dec 10th'08. 4 sons - '55, '58, '60, '69; 1 daughter - '63; 12 grandchildren; 9 great-grandchildren. 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 Other children are close and attentive. ACP/End-of-life: No CPR, no mechanical ventilation, no futile/heroic measures.    Family History  Problem Relation Age of Onset  . Aneurysm Mother     AAA  . Hypertension Mother   . Hyperlipidemia Mother   . Heart disease Father     CAD, MI x 3  . Heart attack Father   . Cancer Maternal Aunt     breast cancer  . Diabetes Sister   . Hyperlipidemia Sister   . Hypertension Sister   . Colon cancer Neg Hx   . Stomach cancer Neg Hx       Marti Sleigh, MD 09/06/2016, 1:43 PM

## 2016-09-06 NOTE — Patient Instructions (Signed)
Return to see [place] in 6 months. We will contact her with results of year blood work.

## 2016-09-10 LAB — INHIBIN B: Inhibin B: <7 pg/mL (ref 0.0–16.9)

## 2016-09-11 LAB — ANTI MULLERIAN HORMONE: ANTI-MULLERIAN HORMONE (AMH): <0.015 ng/mL

## 2016-09-12 ENCOUNTER — Encounter: Payer: Self-pay | Admitting: Internal Medicine

## 2016-09-12 ENCOUNTER — Ambulatory Visit (INDEPENDENT_AMBULATORY_CARE_PROVIDER_SITE_OTHER): Payer: Medicare Other | Admitting: Internal Medicine

## 2016-09-12 DIAGNOSIS — G8929 Other chronic pain: Secondary | ICD-10-CM | POA: Diagnosis not present

## 2016-09-12 DIAGNOSIS — M5440 Lumbago with sciatica, unspecified side: Secondary | ICD-10-CM

## 2016-09-12 DIAGNOSIS — I714 Abdominal aortic aneurysm, without rupture, unspecified: Secondary | ICD-10-CM

## 2016-09-12 DIAGNOSIS — I10 Essential (primary) hypertension: Secondary | ICD-10-CM

## 2016-09-12 MED ORDER — HYDROCHLOROTHIAZIDE 12.5 MG PO CAPS: 12.5000 mg | ORAL_CAPSULE | Freq: Every day | ORAL | 3 refills | Status: DC

## 2016-09-12 MED ORDER — TIZANIDINE HCL 4 MG PO TABS: 4.0000 mg | ORAL_TABLET | Freq: Three times a day (TID) | ORAL | 2 refills | Status: DC | PRN

## 2016-09-12 NOTE — Assessment & Plan Note (Signed)
HCTZ and Valsartan

## 2016-09-12 NOTE — Progress Notes (Signed)
Pre visit review using our clinic review tool, if applicable. No additional management support is needed unless otherwise documented below in the visit note. 

## 2016-09-12 NOTE — Progress Notes (Signed)
Subjective:  Patient ID: Tammy Brooks, female    DOB: Jan 31, 1934  Age: 83 y.o. MRN: [id number]  CC: No chief complaint on file.   HPI Tammy Brooks presents for HTN, COPD, dyslipidemia f/u  Outpatient Medications Prior to Visit  Medication Sig Dispense Refill  . allopurinol (ZYLOPRIM) 100 MG tablet TAKE 1 TABLET BY MOUTH  DAILY 90 tablet 3  . amLODipine (NORVASC) 10 MG tablet TAKE 1 TABLET BY MOUTH  DAILY 30 tablet 0  . aspirin EC 81 MG tablet Take 81 mg by mouth daily.    Marland Kitchen atorvastatin (LIPITOR) 20 MG tablet TAKE 1 TABLET BY MOUTH  EVERY DAY 90 tablet 3  . chlorthalidone (HYGROTON) 25 MG tablet TAKE 1/2 TABLET BY MOUTH DAILY AS DIRECTED 15 tablet 1  . cholecalciferol (VITAMIN D) 1000 units tablet Take 1,000 Units by mouth daily.    . colchicine 0.6 MG tablet Take 1 tablet (0.6 mg total) by mouth 4 (four) times daily as needed. 360 tablet 2  . dorzolamide-timolol (COSOPT) 22.3-6.8 MG/ML ophthalmic solution Place 1 drop into the left eye daily. (Patient taking differently: Place 1 drop into the left eye daily. 2-0.5% sol 10.0  One drop Left eye daily) 10 mL 3  . furosemide (LASIX) 20 MG tablet TAKE 1-2 TABLETS BY MOUTH  DAILY AS NEEDED FOR EDEMA. 180 tablet 1  . levothyroxine (SYNTHROID, LEVOTHROID) 25 MCG tablet Take 1 tablet (25 mcg total) by mouth daily before breakfast. 30 tablet 11  . metoprolol (LOPRESSOR) 50 MG tablet TAKE ONE-HALF TABLET BY  MOUTH TWICE A DAY 90 tablet 3  . Polyethylene Glycol 3350 POWD as needed.     Marland Kitchen tiZANidine (ZANAFLEX) 4 MG tablet Take 1 tablet (4 mg total) by mouth every 8 (eight) hours as needed for muscle spasms. 90 tablet 2  . traMADol (ULTRAM) 50 MG tablet Take 1 tablet (50 mg total) by mouth every 8 (eight) hours as needed. 100 tablet 2  . valsartan (DIOVAN) 160 MG tablet Take 160 mg by mouth.     No facility-administered medications prior to visit.     ROS Review of Systems  Constitutional: Negative for activity change, appetite change, chills,  fatigue and unexpected weight change.  HENT: Negative for congestion, mouth sores and sinus pressure.   Eyes: Negative for visual disturbance.  Respiratory: Negative for cough and chest tightness.   Gastrointestinal: Negative for abdominal pain and nausea.  Genitourinary: Negative for difficulty urinating, frequency and vaginal pain.  Musculoskeletal: Negative for back pain and gait problem.  Skin: Negative for pallor and rash.  Neurological: Negative for dizziness, tremors, weakness, numbness and headaches.  Psychiatric/Behavioral: Negative for confusion and sleep disturbance.    Objective:  BP 132/68 (BP Location: Left Arm, Patient Position: Sitting, Cuff Size: Normal)   Pulse (!) 56   Temp 97.5 F (36.4 C) (Oral)   Ht '5\' 3"'$  (1.6 m)   Wt 150 lb 1.9 oz (68.1 kg)   SpO2 98%   BMI 26.59 kg/m   BP Readings from Last 3 Encounters:  09/12/16 132/68  09/06/16 (!) 153/63  08/01/16 (!) 150/78    Wt Readings from Last 3 Encounters:  09/12/16 150 lb 1.9 oz (68.1 kg)  09/06/16 149 lb 9.6 oz (67.9 kg)  08/01/16 151 lb 1.3 oz (68.5 kg)    Physical Exam  Constitutional: She appears well-developed. No distress.  HENT:  Head: Normocephalic.  Right Ear: External ear normal.  Left Ear: External ear normal.  Nose: Nose normal.  Mouth/Throat: Oropharynx is clear and moist.  Eyes: Conjunctivae are normal. Pupils are equal, round, and reactive to light. Right eye exhibits no discharge. Left eye exhibits no discharge.  Neck: Normal range of motion. Neck supple. No JVD present. No tracheal deviation present. No thyromegaly present.  Cardiovascular: Normal rate, regular rhythm and normal heart sounds.   Pulmonary/Chest: No stridor. No respiratory distress. She has no wheezes.  Abdominal: Soft. Bowel sounds are normal. She exhibits no distension and no mass. There is no tenderness. There is no rebound and no guarding.  Musculoskeletal: She exhibits no edema or tenderness.  Lymphadenopathy:     She has no cervical adenopathy.  Neurological: She displays normal reflexes. No cranial nerve deficit. She exhibits normal muscle tone. Coordination normal.  Skin: No rash noted. No erythema.  Psychiatric: She has a normal mood and affect. Her behavior is normal. Judgment and thought content normal.    Lab Results  Component Value Date   WBC 8.9 02/01/2016   HGB 14.1 02/01/2016   HCT 41.5 02/01/2016   PLT 208.0 02/01/2016   GLUCOSE 111 (H) 07/17/2016   CHOL 143 02/01/2016   TRIG 141.0 02/01/2016   HDL 31.60 (L) 02/01/2016   LDLDIRECT 76.7 11/17/2008   LDLCALC 83 02/01/2016   ALT 13 02/01/2016   AST 16 02/01/2016   NA 138 07/17/2016   K 4.3 07/17/2016   CL 106 07/17/2016   CREATININE 0.99 07/17/2016   BUN 17 07/17/2016   CO2 30 07/17/2016   TSH 5.04 (H) 07/17/2016    No results found.  Assessment & Plan:   There are no diagnoses linked to this encounter. I am having Ms. Yaw maintain her aspirin EC, colchicine, dorzolamide-timolol, Polyethylene Glycol 3350, tiZANidine, furosemide, chlorthalidone, cholecalciferol, levothyroxine, atorvastatin, allopurinol, metoprolol, traMADol, amLODipine, and valsartan.  No orders of the defined types were placed in this encounter.    Follow-up: No Follow-up on file.  Walker Kehr, MD

## 2016-09-12 NOTE — Assessment & Plan Note (Addendum)
Tizanidine prn Tramadol prn  Potential benefits of a long term opioids use as well as potential risks (i.e. addiction risk, apnea etc) and complications (i.e. Somnolence, constipation and others) were explained to the patient and were aknowledged.

## 2016-09-12 NOTE — Patient Instructions (Signed)
MC well w/Jill 

## 2016-09-12 NOTE — Assessment & Plan Note (Signed)
BP control is better Labs

## 2016-11-06 ENCOUNTER — Other Ambulatory Visit: Payer: Self-pay

## 2016-11-06 MED ORDER — LEVOTHYROXINE SODIUM 25 MCG PO TABS: 25.0000 ug | ORAL_TABLET | Freq: Every day | ORAL | 1 refills | Status: DC

## 2016-11-11 ENCOUNTER — Telehealth: Payer: Self-pay | Admitting: Cardiovascular Disease

## 2016-11-11 MED ORDER — AMLODIPINE BESYLATE 10 MG PO TABS: 10.0000 mg | ORAL_TABLET | Freq: Every day | ORAL | 0 refills | Status: DC

## 2016-11-11 NOTE — Telephone Encounter (Signed)
Rx sent for enough medication to last until scheduled OV.  Due for f/u appt.    OV scehduled for 8/7 with Dr. Gwenlyn Found.

## 2016-11-11 NOTE — Telephone Encounter (Signed)
°*  STAT* If patient is at the pharmacy, call can be transferred to refill team.   1. Which medications need to be refilled? (please list name of each medication and dose if known) Amlodipine-needs enough of this until she comes for her appt in August 2. Which pharmacy/location (including street and city if local pharmacy) is medication to be sent to?[facility]  3. Do they need a 30 day or 90 day supply? She received 30 and she needs enough until her appt in August

## 2016-12-11 ENCOUNTER — Encounter: Payer: Self-pay | Admitting: Vascular Surgery

## 2016-12-16 ENCOUNTER — Ambulatory Visit
Admission: RE | Admit: 2016-12-16 | Discharge: 2016-12-16 | Disposition: A | Discharge status: Home / Self Care | Payer: Medicare Other | Source: Ambulatory Visit | Attending: Family | Admitting: Family

## 2016-12-16 DIAGNOSIS — I714 Abdominal aortic aneurysm, without rupture, unspecified: Secondary | ICD-10-CM

## 2016-12-16 DIAGNOSIS — I723 Aneurysm of iliac artery: Secondary | ICD-10-CM

## 2016-12-16 DIAGNOSIS — K769 Liver disease, unspecified: Secondary | ICD-10-CM | POA: Diagnosis not present

## 2016-12-16 MED ORDER — IOPAMIDOL (ISOVUE-370) INJECTION 76%
80.0000 mL | Freq: Once | INTRAVENOUS | Status: AC | PRN
  Administered 2016-12-16: 80 mL via INTRAVENOUS

## 2016-12-18 ENCOUNTER — Other Ambulatory Visit (INDEPENDENT_AMBULATORY_CARE_PROVIDER_SITE_OTHER): Payer: Medicare Other

## 2016-12-18 ENCOUNTER — Ambulatory Visit (INDEPENDENT_AMBULATORY_CARE_PROVIDER_SITE_OTHER): Payer: Medicare Other | Admitting: Internal Medicine

## 2016-12-18 ENCOUNTER — Other Ambulatory Visit: Payer: Self-pay | Admitting: Internal Medicine

## 2016-12-18 ENCOUNTER — Encounter: Payer: Self-pay | Admitting: Internal Medicine

## 2016-12-18 DIAGNOSIS — E785 Hyperlipidemia, unspecified: Secondary | ICD-10-CM

## 2016-12-18 DIAGNOSIS — I10 Essential (primary) hypertension: Secondary | ICD-10-CM

## 2016-12-18 DIAGNOSIS — M5440 Lumbago with sciatica, unspecified side: Secondary | ICD-10-CM

## 2016-12-18 DIAGNOSIS — G8929 Other chronic pain: Secondary | ICD-10-CM | POA: Diagnosis not present

## 2016-12-18 LAB — CBC WITH DIFFERENTIAL/PLATELET
BASOS ABS: 0.1 10*3/uL (ref 0.0–0.1)
Basophils Relative: 1.2 % (ref 0.0–3.0)
EOS ABS: 0.2 10*3/uL (ref 0.0–0.7)
Eosinophils Relative: 2.2 % (ref 0.0–5.0)
HEMATOCRIT: 44.1 % (ref 36.0–46.0)
HEMOGLOBIN: 14.4 g/dL (ref 12.0–15.0)
LYMPHS PCT: 27.9 % (ref 12.0–46.0)
Lymphs Abs: 2.3 10*3/uL (ref 0.7–4.0)
MCHC: 32.7 g/dL (ref 30.0–36.0)
MCV: 88 fl (ref 78.0–100.0)
Monocytes Absolute: 0.6 10*3/uL (ref 0.1–1.0)
Monocytes Relative: 7 % (ref 3.0–12.0)
NEUTROS ABS: 5.1 10*3/uL (ref 1.4–7.7)
Neutrophils Relative %: 61.7 % (ref 43.0–77.0)
PLATELETS: 286 10*3/uL (ref 150.0–400.0)
RBC: 5.01 Mil/uL (ref 3.87–5.11)
RDW: 14.2 % (ref 11.5–15.5)
WBC: 8.2 10*3/uL (ref 4.0–10.5)

## 2016-12-18 LAB — BASIC METABOLIC PANEL
BUN: 19 mg/dL (ref 6–23)
CHLORIDE: 105 meq/L (ref 96–112)
CO2: 27 mEq/L (ref 19–32)
Calcium: 9.7 mg/dL (ref 8.4–10.5)
Creatinine, Ser: 1.1 mg/dL (ref 0.40–1.20)
GFR: 50.64 mL/min — ABNORMAL LOW (ref >60.00)
Glucose, Bld: 110 mg/dL — ABNORMAL HIGH (ref 70–99)
POTASSIUM: 4.5 meq/L (ref 3.5–5.1)
Sodium: 138 mEq/L (ref 135–145)

## 2016-12-18 LAB — URINALYSIS
Bilirubin Urine: NEGATIVE
HGB URINE DIPSTICK: NEGATIVE
Ketones, ur: NEGATIVE
Leukocytes, UA: NEGATIVE
NITRITE: NEGATIVE
Specific Gravity, Urine: 1.02 (ref 1.000–1.030)
TOTAL PROTEIN, URINE-UPE24: NEGATIVE
Urine Glucose: NEGATIVE
Urobilinogen, UA: 0.2 (ref 0.0–1.0)
pH: 5.5 (ref 5.0–8.0)

## 2016-12-18 LAB — HEPATIC FUNCTION PANEL
ALK PHOS: 60 U/L (ref 39–117)
ALT: 13 U/L (ref 0–35)
AST: 18 U/L (ref 0–37)
Albumin: 4 g/dL (ref 3.5–5.2)
BILIRUBIN DIRECT: 0 mg/dL (ref 0.0–0.3)
TOTAL PROTEIN: 6.9 g/dL (ref 6.0–8.3)
Total Bilirubin: 0.4 mg/dL (ref 0.2–1.2)

## 2016-12-18 LAB — CK: CK TOTAL: 44 U/L (ref 7–177)

## 2016-12-18 LAB — TSH: TSH: 7.63 u[IU]/mL — ABNORMAL HIGH (ref 0.35–4.50)

## 2016-12-18 LAB — SEDIMENTATION RATE: SED RATE: 11 mm/h (ref 0–30)

## 2016-12-18 MED ORDER — TRAMADOL HCL 50 MG PO TABS: 50.0000 mg | ORAL_TABLET | Freq: Three times a day (TID) | ORAL | 2 refills | Status: DC | PRN

## 2016-12-18 MED ORDER — TELMISARTAN 40 MG PO TABS
40.0000 mg | ORAL_TABLET | Freq: Every day | ORAL | 3 refills | Status: DC
Start: 2016-12-18 — End: 2017-02-21

## 2016-12-18 MED ORDER — LEVOTHYROXINE SODIUM 50 MCG PO TABS
50.0000 ug | ORAL_TABLET | Freq: Every day | ORAL | 3 refills | Status: DC
Start: 2016-12-18 — End: 2017-08-21

## 2016-12-18 NOTE — Progress Notes (Signed)
Subjective:  Patient ID: Tammy Brooks, female    DOB: 1933/11/17  Age: 83 y.o. MRN: [id number]  CC: No chief complaint on file.   HPI Tammy Brooks presents for LBP, hip pain. C/o stiffness in am - 15 min We stopped Meloxicam due to high BP  Outpatient Medications Prior to Visit  Medication Sig Dispense Refill  . allopurinol (ZYLOPRIM) 100 MG tablet TAKE 1 TABLET BY MOUTH  DAILY 90 tablet 3  . amLODipine (NORVASC) 10 MG tablet Take 1 tablet (10 mg total) by mouth daily. Please keep follow up appointment for further refills.  Thanks! 60 tablet 0  . aspirin EC 81 MG tablet Take 81 mg by mouth daily.    Marland Kitchen atorvastatin (LIPITOR) 20 MG tablet TAKE 1 TABLET BY MOUTH  EVERY DAY 90 tablet 3  . cholecalciferol (VITAMIN D) 1000 units tablet Take 1,000 Units by mouth daily.    . colchicine 0.6 MG tablet Take 1 tablet (0.6 mg total) by mouth 4 (four) times daily as needed. 360 tablet 2  . dorzolamide-timolol (COSOPT) 22.3-6.8 MG/ML ophthalmic solution Place 1 drop into the left eye daily. (Patient taking differently: Place 1 drop into the left eye daily. 2-0.5% sol 10.0  One drop Left eye daily) 10 mL 3  . furosemide (LASIX) 20 MG tablet TAKE 1-2 TABLETS BY MOUTH  DAILY AS NEEDED FOR EDEMA. 180 tablet 1  . hydrochlorothiazide (MICROZIDE) 12.5 MG capsule Take 1 capsule (12.5 mg total) by mouth daily. 90 capsule 3  . levothyroxine (SYNTHROID, LEVOTHROID) 25 MCG tablet Take 1 tablet (25 mcg total) by mouth daily before breakfast. 90 tablet 1  . metoprolol (LOPRESSOR) 50 MG tablet TAKE ONE-HALF TABLET BY  MOUTH TWICE A DAY 90 tablet 3  . Polyethylene Glycol 3350 POWD as needed.     Marland Kitchen tiZANidine (ZANAFLEX) 4 MG tablet Take 1 tablet (4 mg total) by mouth every 8 (eight) hours as needed for muscle spasms. 90 tablet 2  . traMADol (ULTRAM) 50 MG tablet Take 1 tablet (50 mg total) by mouth every 8 (eight) hours as needed. 100 tablet 2  . valsartan (DIOVAN) 160 MG tablet Take 160 mg by mouth.     No  facility-administered medications prior to visit.     ROS Review of Systems  Constitutional: Negative for activity change, appetite change, chills, fatigue and unexpected weight change.  HENT: Negative for congestion, mouth sores and sinus pressure.   Eyes: Negative for visual disturbance.  Respiratory: Negative for cough and chest tightness.   Gastrointestinal: Negative for abdominal pain and nausea.  Genitourinary: Negative for difficulty urinating, frequency and vaginal pain.  Musculoskeletal: Positive for arthralgias, back pain and gait problem.  Skin: Negative for pallor and rash.  Neurological: Negative for dizziness, tremors, weakness, numbness and headaches.  Psychiatric/Behavioral: Negative for confusion, sleep disturbance and suicidal ideas.    Objective:  BP 130/78 (BP Location: Left Arm, Patient Position: Sitting, Cuff Size: Normal)   Pulse (!) 56   Temp 98.2 F (36.8 C) (Oral)   Ht 5\' 3"  (1.6 m)   Wt 145 lb (65.8 kg)   SpO2 98%   BMI 25.69 kg/m   BP Readings from Last 3 Encounters:  12/18/16 130/78  09/12/16 132/68  09/06/16 (!) 153/63    Wt Readings from Last 3 Encounters:  12/18/16 145 lb (65.8 kg)  09/12/16 150 lb 1.9 oz (68.1 kg)  09/06/16 149 lb 9.6 oz (67.9 kg)    Physical Exam  Constitutional: She appears  well-developed. No distress.  HENT:  Head: Normocephalic.  Right Ear: External ear normal.  Left Ear: External ear normal.  Nose: Nose normal.  Mouth/Throat: Oropharynx is clear and moist.  Eyes: Pupils are equal, round, and reactive to light. Conjunctivae are normal. Right eye exhibits no discharge. Left eye exhibits no discharge.  Neck: Normal range of motion. Neck supple. No JVD present. No tracheal deviation present. No thyromegaly present.  Cardiovascular: Normal rate, regular rhythm and normal heart sounds.   Pulmonary/Chest: No stridor. No respiratory distress. She has no wheezes.  Abdominal: Soft. Bowel sounds are normal. She exhibits  no distension and no mass. There is no tenderness. There is no rebound and no guarding.  Musculoskeletal: She exhibits tenderness. She exhibits no edema.  Lymphadenopathy:    She has no cervical adenopathy.  Neurological: She displays normal reflexes. No cranial nerve deficit. She exhibits normal muscle tone. Coordination normal.  Skin: No rash noted. No erythema.  Psychiatric: She has a normal mood and affect. Her behavior is normal. Judgment and thought content normal.  LS and hips tender  Lab Results  Component Value Date   WBC 8.9 02/01/2016   HGB 14.1 02/01/2016   HCT 41.5 02/01/2016   PLT 208.0 02/01/2016   GLUCOSE 111 (H) 07/17/2016   CHOL 143 02/01/2016   TRIG 141.0 02/01/2016   HDL 31.60 (L) 02/01/2016   LDLDIRECT 76.7 11/17/2008   LDLCALC 83 02/01/2016   ALT 13 02/01/2016   AST 16 02/01/2016   NA 138 07/17/2016   K 4.3 07/17/2016   CL 106 07/17/2016   CREATININE 0.99 07/17/2016   BUN 17 07/17/2016   CO2 30 07/17/2016   TSH 5.04 (H) 07/17/2016    Ct Angio Abdomen Pelvis  W &/or Wo Contrast  Result Date: 12/16/2016 CLINICAL DATA:  Abdominal aortic aneurysm EXAM: CTA ABDOMEN AND PELVIS wITHOUT AND WITH CONTRAST TECHNIQUE: Multidetector CT imaging of the abdomen and pelvis was performed using the standard protocol during bolus administration of intravenous contrast. Multiplanar reconstructed images and MIPs were obtained and reviewed to evaluate the vascular anatomy. CONTRAST:  80 cc Isovue 370 COMPARISON:  03/07/2014 FINDINGS: VASCULAR Aorta: A long lobulated abdominal aortic aneurysm is present extending from the renal artery is to the aortic bifurcation. Maximal AP and transverse diameters are 4.6 and 4.5 cm. Previously, maximal diameter was measured at 4.3 cm. Measurements are best and most accurately obtained from sagittal and coronal reconstruction images. There is extensive chronic mural thrombus throughout the aneurysmal segment. Celiac: Patent.  Branch vessels are  patent. SMA: Patent. Renals: The right renal artery is patent. There is 50% narrowing of the left renal artery. IMA: Origin is occluded. Branch vessels are diminutive and reconstitute. Inflow: There is mild narrowing at the origin of the right common iliac artery. There are atherosclerotic changes of the mid and distal right common iliac artery is a maximal diameter of 1.4 cm. There are atherosclerotic changes of the right internal and external iliac arteries without significant focal narrowing The proximal left common iliac artery is aneurysmal with a maximal diameter of 3.5 cm. After a direct comparison to the prior study with my personal measurements on the prior study, there has been no significant change. The lumen is patent with atherosclerotic changes. Left internal and external iliac arteries are patent. Proximal Outflow: Grossly patent and diminutive with atherosclerotic changes. Veins: Renal veins are patent. Splenic vein is grossly patent. Portal vein is patent. Review of the MIP images confirms the above findings. NON-VASCULAR Lower  chest: No acute abnormality. Hepatobiliary: There is an avidly enhancing lesion at the dome of the liver measuring 10 mm. See image 18 of series 5. It was probably present on they prior studies characterized by an area of low density. It is more visible today given the enhancement pattern. Gallbladder is within normal limits. Pancreas: Unremarkable Spleen: Unremarkable Adrenals/Urinary Tract: There are chronic changes of the kidneys there are small calculi in the lower pole of the left kidney. Simple cyst in the lower pole of the left kidney. Stable small left adrenal nodule. Stomach/Bowel: Stomach is within normal limits. No evidence of mass in the colon. Diverticulosis of the sigmoid colon. No evidence of small-bowel obstruction. Lymphatic: No evidence of retroperitoneal adenopathy. Reproductive: Uterus is absent.  Adnexa are within normal limits. Other: No free-fluid.  There is a lower abdominal anterior hernia containing adipose tissue and small bowel loops. Musculoskeletal: No vertebral compression deformity. IMPRESSION: VASCULAR Abdominal aortic aneurysm maximal diameter today is 4.6 cm and previously, it was 4.3 cm. Recommend followup by abdomen and pelvis CTA in 6 months, and vascular surgery referral/consultation if not already obtained. This recommendation follows ACR consensus guidelines: White Paper of the ACR Incidental Findings Committee II on Vascular Findings. J Am Coll Radiol 2013; 10:789-794. 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 NON-VASCULAR Avidly enhancing right lobe liver lesion measuring 10 mm was probably present on the prior study and is most likely a benign entity. If there is high risk or history of malignancy, six-month follow-up MRI is recommended. Left nephrolithiasis. New lower abdominal ventral hernia. Electronically Signed   By: Marybelle Killings M.D.   On: 12/16/2016 13:57    Assessment & Plan:   There are no diagnoses linked to this encounter. I am having Ms. Wickersham maintain her aspirin EC, colchicine, dorzolamide-timolol, Polyethylene Glycol 3350, furosemide, cholecalciferol, atorvastatin, allopurinol, metoprolol tartrate, traMADol, valsartan, tiZANidine, hydrochlorothiazide, levothyroxine, and amLODipine.  No orders of the defined types were placed in this encounter.    Follow-up: No Follow-up on file.  Walker Kehr, MD

## 2016-12-18 NOTE — Assessment & Plan Note (Signed)
Try Turmeric capsules for back joints

## 2016-12-18 NOTE — Patient Instructions (Signed)
Try Turmeric capsules for back joints

## 2016-12-18 NOTE — Assessment & Plan Note (Signed)
On Simvastatin 

## 2016-12-18 NOTE — Assessment & Plan Note (Signed)
D/c Valsartan (Diovan) - start Micardis 40 mg instead

## 2016-12-19 LAB — RHEUMATOID FACTOR: Rhuematoid fact SerPl-aCnc: <14 IU/mL (ref <14)

## 2016-12-23 ENCOUNTER — Other Ambulatory Visit: Payer: Self-pay | Admitting: Cardiovascular Disease

## 2016-12-24 ENCOUNTER — Encounter: Payer: Self-pay | Admitting: Vascular Surgery

## 2016-12-24 ENCOUNTER — Ambulatory Visit (INDEPENDENT_AMBULATORY_CARE_PROVIDER_SITE_OTHER): Payer: Medicare Other | Admitting: Vascular Surgery

## 2016-12-24 VITALS — BP 148/72 | HR 51 | Temp 98.0°F | Resp 20 | Ht 63.0 in | Wt 145.8 lb

## 2016-12-24 DIAGNOSIS — I714 Abdominal aortic aneurysm, without rupture, unspecified: Secondary | ICD-10-CM

## 2016-12-24 DIAGNOSIS — I723 Aneurysm of iliac artery: Secondary | ICD-10-CM

## 2016-12-24 NOTE — Progress Notes (Signed)
[facility] [place]  Patient name: Tammy Brooks MRN: [id number] DOB: 06/30/1933 Sex: female  REASON FOR VISIT: Follow-up abdominal and left iliac artery aneurysm with CT  HPI: Tammy Brooks is a 83 y.o. female here today for follow-up. She is with her daughter. She does have a known history abdominal aortic aneurysm initially discovered incidentally. She had a CT scan in 2015 and has had serial ultrasound studies since then. On her last study. She may have had some growth and was poor visualization due to overlying bowel gas and therefore I'll on this follow-up visit is having CT scan for further evaluation. In her CT scan 2015 she was found to have a pelvic mass was felt to potentially be malignancy and subsequently has had this resected was found to be a benign tumor. She had does have a history of moderate coronary disease. She has noted a ventral incisional hernia from her GYN low midline incision. She feels that this is enlarging.  Past Medical History:  Diagnosis Date  . Abdominal aortic aneurysm ([place])    followed by Dr. Sherren Mocha Makenize Messman  . Adenomatous colon polyp 1998  . Arthritis   . Blind left eye   . CAD (coronary artery disease) 08/1995   50-55% second diag dz.; occluded OM  . Complication of anesthesia    "Blood pressure went down" with retinal surgery - no problems with previous surgeries  . Echocardiogram abnormal 03/2012   mild to mod AS, valve area1.58 cm2, mild concentric LVH, grade I diastolic dusfunction  . Gout   . Heart murmur   . Hyperlipidemia   . Hypertension   . Myocardial infarction [facility])    mild/ age 89  . PAD (peripheral artery disease) (HCC)    mild to mod Rt ICA stenosis followed by doppler-02/05/12  . Pelvic mass   . Stroke [facility]) 2001   no residual problems  . Tobacco abuse   . Ulcer     Family History  Problem Relation Age of Onset  . Aneurysm Mother        AAA  . Hypertension Mother   .  Hyperlipidemia Mother   . Heart disease Father        CAD, MI x 3  . Heart attack Father   . Cancer Maternal Aunt        breast cancer  . Diabetes Sister   . Hyperlipidemia Sister   . Hypertension Sister   . Colon cancer Neg Hx   . Stomach cancer Neg Hx     SOCIAL HISTORY: Social History  Substance Use Topics  . Smoking status: Current Every Day Smoker    Packs/day: 1.00    Years: 62.00    Types: Cigarettes  . Smokeless tobacco: Never Used  . Alcohol use 0.0 oz/week     Comment: occasionally - few drinks/month    Allergies  Allergen Reactions  . Nabumetone Anaphylaxis and Other (See Comments)    Causes inflammation    Current Outpatient Prescriptions  Medication Sig Dispense Refill  . allopurinol (ZYLOPRIM) 100 MG tablet TAKE 1 TABLET BY MOUTH  DAILY 90 tablet 3  . amLODipine (NORVASC) 10 MG tablet TAKE 1 TABLET BY MOUTH  DAILY 60 tablet 1  . aspirin EC 81 MG tablet Take 81 mg by mouth daily.    Marland Kitchen atorvastatin (LIPITOR) 20 MG tablet TAKE 1 TABLET BY MOUTH  EVERY DAY 90 tablet 3  . cholecalciferol (VITAMIN D) 1000 units tablet Take 1,000  Units by mouth daily.    . colchicine 0.6 MG tablet Take 1 tablet (0.6 mg total) by mouth 4 (four) times daily as needed. 360 tablet 2  . dorzolamide-timolol (COSOPT) 22.3-6.8 MG/ML ophthalmic solution Place 1 drop into the left eye daily. (Patient taking differently: Place 1 drop into the left eye daily. 2-0.5% sol 10.0  One drop Left eye daily) 10 mL 3  . furosemide (LASIX) 20 MG tablet TAKE 1-2 TABLETS BY MOUTH  DAILY AS NEEDED FOR EDEMA. 180 tablet 1  . hydrochlorothiazide (MICROZIDE) 12.5 MG capsule Take 1 capsule (12.5 mg total) by mouth daily. 90 capsule 3  . levothyroxine (SYNTHROID, LEVOTHROID) 50 MCG tablet Take 1 tablet (50 mcg total) by mouth daily. 90 tablet 3  . metoprolol (LOPRESSOR) 50 MG tablet TAKE ONE-HALF TABLET BY  MOUTH TWICE A DAY 90 tablet 3  . Polyethylene Glycol 3350 POWD as needed.     Marland Kitchen telmisartan (MICARDIS) 40  MG tablet Take 1 tablet (40 mg total) by mouth daily. 90 tablet 3  . tiZANidine (ZANAFLEX) 4 MG tablet Take 1 tablet (4 mg total) by mouth every 8 (eight) hours as needed for muscle spasms. 90 tablet 2  . traMADol (ULTRAM) 50 MG tablet Take 1 tablet (50 mg total) by mouth every 8 (eight) hours as needed. 100 tablet 2   No current facility-administered medications for this visit.     REVIEW OF SYSTEMS:  [X]  denotes positive finding, [ ]  denotes negative finding Cardiac  Comments:  Chest pain or chest pressure:    Shortness of breath upon exertion:    Short of breath when lying flat:    Irregular heart rhythm:        Vascular    Pain in calf, thigh, or hip brought on by ambulation:    Pain in feet at night that wakes you up from your sleep:     Blood clot in your veins:    Leg swelling:           PHYSICAL EXAM: Vitals:   12/24/16 0931  BP: (!) 148/72  Pulse: (!) 51  Resp: 20  Temp: 98 F (36.7 C)  TempSrc: Oral  SpO2: 98%  Weight: 145 lb 12.8 oz (66.1 kg)  Height: 5\' 3"  (1.6 m)    GENERAL: The patient is a well-nourished female, in no acute distress. The vital signs are documented above. CARDIOVASCULAR: 2+ radial pulses bilaterally. Carotid arteries without bruits bilaterally. Systolic heart murmur. Abdomen nontender and no aneurysm palpable PULMONARY: There is good air exchange  MUSCULOSKELETAL: There are no major deformities or cyanosis. NEUROLOGIC: No focal weakness or paresthesias are detected. SKIN: There are no ulcers or rashes noted. PSYCHIATRIC: The patient has a normal affect.  DATA:  CT scan shows no significant change compared to her 2015 study. Maximal aortic diameter at that time was felt to be 4.3 compared to 4.6 cm currently. She does have extension directly into her proximal left iliac artery and this is unchanged at 3.5. Her more distal common iliac and external and internal iliac arteries are otherwise normal. She does have mural thrombus in the aneurysm  and initiates just below her renal arteries  MEDICAL ISSUES: Stable moderate size abdominal aortic aneurysm and left common iliac artery aneurysm. I explained that her option of would be open surgery since she is not a stent graft candidate related to her aneurysmal anatomy. Certainly feel that her risk for rupture is much smaller than her risk for surgery currently. We  will see her at 6 month intervals with ultrasound to rule out enlargement.  Her CT scan did show a 1 cm lesion in her liver that the radiologist interpretation was that this was probably present 3 years ago and would recommend further evaluation if she was at high risk for malignancy which she does not appear to be. She is comfortable with observation only.  Regarding her ventral incisional hernia, she did see someone with [facility] who declined surgical treatment since she was a smoker. Will discuss rereferral since she is having discomfort associated with this and enlargement.    Rosetta Posner, MD FACS [facility] Tel [phone] Pager [phone]

## 2016-12-27 NOTE — Addendum Note (Signed)
Addended by: Lianne Cure A on: 12/27/2016 03:44 PM   Modules accepted: Orders

## 2016-12-30 ENCOUNTER — Ambulatory Visit: Payer: Medicare Other | Admitting: Family

## 2016-12-30 ENCOUNTER — Telehealth: Payer: Self-pay | Admitting: Internal Medicine

## 2016-12-30 ENCOUNTER — Other Ambulatory Visit (HOSPITAL_COMMUNITY): Payer: Medicare Other

## 2016-12-30 NOTE — Telephone Encounter (Signed)
Pt called stating she received a 25mg  tablet for levothyroxine (SYNTHROID, LEVOTHROID) 50 MCG tablet  And a .5 mg tablet of levothyroxine  She would like a call back in regard

## 2016-12-30 NOTE — Telephone Encounter (Signed)
Pt notified the her TSH level was elevated and that's why a new RX was sent in as we discussed when I called with her lab results

## 2017-01-07 ENCOUNTER — Ambulatory Visit (INDEPENDENT_AMBULATORY_CARE_PROVIDER_SITE_OTHER): Payer: Medicare Other | Admitting: Cardiovascular Disease

## 2017-01-07 ENCOUNTER — Encounter: Payer: Self-pay | Admitting: Cardiovascular Disease

## 2017-01-07 VITALS — BP 132/58 | HR 53 | Ht 63.5 in | Wt 144.6 lb

## 2017-01-07 DIAGNOSIS — I739 Peripheral vascular disease, unspecified: Secondary | ICD-10-CM | POA: Diagnosis not present

## 2017-01-07 DIAGNOSIS — F17200 Nicotine dependence, unspecified, uncomplicated: Secondary | ICD-10-CM | POA: Diagnosis not present

## 2017-01-07 DIAGNOSIS — E785 Hyperlipidemia, unspecified: Secondary | ICD-10-CM | POA: Diagnosis not present

## 2017-01-07 DIAGNOSIS — I10 Essential (primary) hypertension: Secondary | ICD-10-CM

## 2017-01-07 DIAGNOSIS — I2510 Atherosclerotic heart disease of native coronary artery without angina pectoris: Secondary | ICD-10-CM | POA: Diagnosis not present

## 2017-01-07 DIAGNOSIS — R0989 Other specified symptoms and signs involving the circulatory and respiratory systems: Secondary | ICD-10-CM | POA: Diagnosis not present

## 2017-01-07 DIAGNOSIS — I350 Nonrheumatic aortic (valve) stenosis: Secondary | ICD-10-CM

## 2017-01-07 NOTE — Assessment & Plan Note (Signed)
History of ongoing tobacco abuse recalcitrant risk factor modification

## 2017-01-07 NOTE — Assessment & Plan Note (Signed)
History of mild CAD by cardiac cath in 1997. She denies chest pain.

## 2017-01-07 NOTE — Patient Instructions (Signed)
Medication Instructions: Your physician recommends that you continue on your current medications as directed. Please refer to the Current Medication list given to you today.  Testing/Procedures: Your physician has requested that you have an echocardiogram. Echocardiography is a painless test that uses sound waves to create images of your heart. It provides your doctor with information about the size and shape of your heart and how well your heart's chambers and valves are working. This procedure takes approximately one hour. There are no restrictions for this procedure.  Your physician has requested that you have a carotid duplex. This test is an ultrasound of the carotid arteries in your neck. It looks at blood flow through these arteries that supply the brain with blood. Allow one hour for this exam. There are no restrictions or special instructions.   Follow-Up: Your physician wants you to follow-up in: 1 year with Dr. Gwenlyn Found. You will receive a reminder letter in the mail two months in advance. If you don't receive a letter, please call our office to schedule the follow-up appointment.  If you need a refill on your cardiac medications before your next appointment, please call your pharmacy.

## 2017-01-07 NOTE — Assessment & Plan Note (Signed)
History of mild ICA stenosis by duplex ultrasound 2016. She does have bilateral carotid bruits with ongoing risk factors. I'm going to repeat a carotid Doppler study.

## 2017-01-07 NOTE — Assessment & Plan Note (Signed)
History of abdominal aortic aneurysm followed by Dr. Sherren Mocha Early with recent CT that showed anatomy not suitable for endoluminal stent grafting.

## 2017-01-07 NOTE — Assessment & Plan Note (Signed)
History of essential hypertension blood pressure measured 132/58. She is on hydrochlorothiazide and Micardis as well as Lopressor. Continue current meds at current dosing

## 2017-01-07 NOTE — Assessment & Plan Note (Signed)
History of moderate aortic stenosis by 2-D echocardiogram last checked in 2016. She does have a loud outflow tract murmur. We will recheck a 2-D echocardiogram.

## 2017-01-07 NOTE — Assessment & Plan Note (Signed)
History of dyslipidemia on statin therapy with lipid profile performed 02/01/16 revealed total cholesterol 143, LDL of 83 and HDL of 31.

## 2017-01-07 NOTE — Progress Notes (Signed)
01/07/2017 Tammy Brooks   [birth year]  [id number]  Primary Physician Plotnikov, Evie Lacks, MD Primary Cardiologist: Lorretta Harp MD FACP, [place], [place], [place]  HPI:  Tammy Brooks is a 83 y.o. female   mildly overweight widowed Caucasian female mother of 96, grandmother of 65 grandchildren well last saw 04/21/15. She has a history of mild CAD by cath in 1997. Her lipid problems include continued tobacco abuse one pack per day, hypertension, and hyperlipidemia all medically treated. She does have mild to moderate aortic stenosis with valve area of 1 cm2 by 2-D echo one year ago.. She also has moderate right internal carotid artery stenosis by duplex ultrasound. She was recently diagnosed with a small abdominal aortic and iliac aneurysm followed by Dr. Donnetta Hutching. She denies chest pain or shortness of breath. She was found to have a pelvic mass which was surgically excised last year and found to be benign. She does have a hernia as result of this.    No outpatient prescriptions have been marked as taking for the 01/07/17 encounter (Office Visit) with Lorretta Harp, MD.     Allergies  Allergen Reactions  . Nabumetone Anaphylaxis and Other (See Comments)    Causes inflammation    Social History   Social History  . Marital status: Widowed    Spouse name: N/A  . Number of children: 5  . Years of education: 9   Occupational History  . Oncologist     retired   Social History Main Topics  . Smoking status: Current Every Day Smoker    Packs/day: 1.00    Years: 62.00    Types: Cigarettes  . Smokeless tobacco: Never Used  . Alcohol use 0.0 oz/week     Comment: occasionally - few drinks/month  . Drug use: No  . Sexual activity: No   Other Topics Concern  . Not on file   Social History Narrative   9th grade. married '54- widowed Dec 10th'08. 4 sons - '55, '58, '60, '69; 1 daughter - '63; 12 grandchildren; 9 great-grandchildren. 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 Live -  own home, one son lives with her. Other children are close and attentive. ACP/End-of-life: No CPR, no mechanical ventilation, no futile/heroic measures.     Review of Systems: General: negative for chills, fever, night sweats or weight changes.  Cardiovascular: negative for chest pain, dyspnea on exertion, edema, orthopnea, palpitations, paroxysmal nocturnal dyspnea or shortness of breath Dermatological: negative for rash Respiratory: negative for cough or wheezing Urologic: negative for hematuria Abdominal: negative for nausea, vomiting, diarrhea, bright red blood per rectum, melena, or hematemesis Neurologic: negative for visual changes, syncope, or dizziness All other systems reviewed and are otherwise negative except as noted above.    Blood pressure (!) 132/58, pulse (!) 53, height 5' 3.5" (1.613 m), weight 144 lb 9.6 oz (65.6 kg).  General appearance: alert and no distress Neck: no adenopathy, no JVD, supple, symmetrical, trachea midline, thyroid not enlarged, symmetric, no tenderness/mass/nodules and Soft bilateral carotid bruits Lungs: clear to auscultation bilaterally Heart: 2/6 outflow tract murmur consistent with aortic stenosis. Extremities: extremities normal, atraumatic, no cyanosis or edema  EKG sinus bradycardia 53 with septal Q waves. I personally reviewed this EKG.  ASSESSMENT AND PLAN:   Hx of carotid artery stenosis, RT. -moderate, followed by Dr. Gwenlyn Found, Last dopplers 02/2012 History of mild ICA stenosis by duplex ultrasound 2016. She does have bilateral carotid bruits with ongoing risk factors. I'm going to  repeat a carotid Doppler study.  Dyslipidemia History of dyslipidemia on statin therapy with lipid profile performed 02/01/16 revealed total cholesterol 143, LDL of 83 and HDL of 31.  TOBACCO ABUSE History of ongoing tobacco abuse recalcitrant risk factor modification  Essential hypertension History of essential hypertension blood pressure measured 132/58.  She is on hydrochlorothiazide and Micardis as well as Lopressor. Continue current meds at current dosing  Aortic stenosis History of moderate aortic stenosis by 2-D echocardiogram last checked in 2016. She does have a loud outflow tract murmur. We will recheck a 2-D echocardiogram.  Coronary atherosclerosis History of mild CAD by cardiac cath in 1997. She denies chest pain.  Peripheral vascular disease ([place]) History of abdominal aortic aneurysm followed by Dr. Sherren Mocha Early with recent CT that showed anatomy not suitable for endoluminal stent grafting.      Lorretta Harp MD FACP,FACC,FAHA, [facility] 01/07/2017 11:31 AM

## 2017-01-09 DIAGNOSIS — H2702 Aphakia, left eye: Secondary | ICD-10-CM | POA: Diagnosis not present

## 2017-01-09 DIAGNOSIS — H338 Other retinal detachments: Secondary | ICD-10-CM | POA: Diagnosis not present

## 2017-01-09 DIAGNOSIS — Z961 Presence of intraocular lens: Secondary | ICD-10-CM | POA: Diagnosis not present

## 2017-01-09 DIAGNOSIS — H5440 Blindness, one eye, unspecified eye: Secondary | ICD-10-CM | POA: Diagnosis not present

## 2017-01-09 DIAGNOSIS — H353131 Nonexudative age-related macular degeneration, bilateral, early dry stage: Secondary | ICD-10-CM | POA: Diagnosis not present

## 2017-01-17 ENCOUNTER — Ambulatory Visit (HOSPITAL_COMMUNITY)
Admission: RE | Admit: 2017-01-17 | Discharge: 2017-01-17 | Disposition: A | Discharge status: Home / Self Care | Payer: Medicare Other | Source: Ambulatory Visit | Attending: Cardiovascular Disease | Admitting: Cardiovascular Disease

## 2017-01-17 DIAGNOSIS — Z720 Tobacco use: Secondary | ICD-10-CM | POA: Insufficient documentation

## 2017-01-17 DIAGNOSIS — I6523 Occlusion and stenosis of bilateral carotid arteries: Secondary | ICD-10-CM | POA: Diagnosis not present

## 2017-01-17 DIAGNOSIS — I10 Essential (primary) hypertension: Secondary | ICD-10-CM | POA: Diagnosis not present

## 2017-01-17 DIAGNOSIS — I739 Peripheral vascular disease, unspecified: Secondary | ICD-10-CM | POA: Insufficient documentation

## 2017-01-17 DIAGNOSIS — R0989 Other specified symptoms and signs involving the circulatory and respiratory systems: Secondary | ICD-10-CM | POA: Insufficient documentation

## 2017-01-17 DIAGNOSIS — I2510 Atherosclerotic heart disease of native coronary artery without angina pectoris: Secondary | ICD-10-CM | POA: Insufficient documentation

## 2017-01-17 DIAGNOSIS — E785 Hyperlipidemia, unspecified: Secondary | ICD-10-CM | POA: Diagnosis not present

## 2017-01-20 DIAGNOSIS — K432 Incisional hernia without obstruction or gangrene: Secondary | ICD-10-CM | POA: Diagnosis not present

## 2017-01-21 ENCOUNTER — Ambulatory Visit (HOSPITAL_COMMUNITY): Payer: Medicare Other | Attending: Cardiovascular Disease

## 2017-01-21 ENCOUNTER — Other Ambulatory Visit: Payer: Self-pay

## 2017-01-21 DIAGNOSIS — E785 Hyperlipidemia, unspecified: Secondary | ICD-10-CM | POA: Diagnosis not present

## 2017-01-21 DIAGNOSIS — Z720 Tobacco use: Secondary | ICD-10-CM | POA: Diagnosis not present

## 2017-01-21 DIAGNOSIS — I352 Nonrheumatic aortic (valve) stenosis with insufficiency: Secondary | ICD-10-CM | POA: Insufficient documentation

## 2017-01-21 DIAGNOSIS — Z8673 Personal history of transient ischemic attack (TIA), and cerebral infarction without residual deficits: Secondary | ICD-10-CM | POA: Insufficient documentation

## 2017-01-21 DIAGNOSIS — I6529 Occlusion and stenosis of unspecified carotid artery: Secondary | ICD-10-CM | POA: Insufficient documentation

## 2017-01-21 DIAGNOSIS — R001 Bradycardia, unspecified: Secondary | ICD-10-CM | POA: Insufficient documentation

## 2017-01-21 DIAGNOSIS — I739 Peripheral vascular disease, unspecified: Secondary | ICD-10-CM | POA: Insufficient documentation

## 2017-01-21 DIAGNOSIS — R600 Localized edema: Secondary | ICD-10-CM | POA: Diagnosis not present

## 2017-01-21 DIAGNOSIS — I714 Abdominal aortic aneurysm, without rupture: Secondary | ICD-10-CM | POA: Diagnosis not present

## 2017-01-21 DIAGNOSIS — I350 Nonrheumatic aortic (valve) stenosis: Secondary | ICD-10-CM | POA: Diagnosis not present

## 2017-01-21 DIAGNOSIS — I119 Hypertensive heart disease without heart failure: Secondary | ICD-10-CM | POA: Diagnosis not present

## 2017-01-22 ENCOUNTER — Other Ambulatory Visit: Payer: Self-pay | Admitting: Cardiovascular Disease

## 2017-01-22 DIAGNOSIS — I350 Nonrheumatic aortic (valve) stenosis: Secondary | ICD-10-CM

## 2017-01-29 ENCOUNTER — Telehealth: Payer: Self-pay | Admitting: Cardiovascular Disease

## 2017-01-29 NOTE — Telephone Encounter (Signed)
Requesting surgical clearance:  1. Type of surgery: Hernia Surgery  2. Surgeon: Dr. Ralene Ok  3.Surgical Date:  TBD  4. Medications that need to be held: ASA 81   5. CAD: Yes  6. I will defer to:  Dr. Pearla Dubonnet Information:  [facility], [place] Phone:  [phone] Fax:  [phone]

## 2017-01-31 NOTE — Progress Notes (Signed)
Pre visit review using our clinic review tool, if applicable. No additional management support is needed unless otherwise documented below in the visit note. 

## 2017-01-31 NOTE — Progress Notes (Addendum)
Subjective:   Tammy Brooks is a 83 y.o. female who presents for Medicare Annual (Subsequent) preventive examination.  Review of Systems:  No ROS.  Medicare Wellness Visit. Additional risk factors are reflected in the social history.  Cardiac Risk Factors include: advanced age (>37men, >48 women);dyslipidemia;hypertension Sleep patterns: feels rested on waking, gets up 1 times nightly to void and sleeps 7-8 hours nightly.    Home Safety/Smoke Alarms: Feels safe in home. Smoke alarms in place.  Living environment; residence and Firearm Safety: 1-story house/ trailer, no firearms. Seat Belt Safety/Bike Helmet: Wears seat belt.      Objective:     Vitals: BP 136/68   Pulse (!) 56   Resp 20   Ht 5\' 4"  (1.626 m)   Wt 146 lb (66.2 kg)   SpO2 97%   BMI 25.06 kg/m   Body mass index is 25.06 kg/m.   Tobacco History  Smoking Status  . Current Every Day Smoker  . Packs/day: 1.00  . Years: 62.00  . Types: Cigarettes  Smokeless Tobacco  . Never Used     Ready to quit: Not Answered Counseling given: Not Answered   Past Medical History:  Diagnosis Date  . Abdominal aortic aneurysm ([place])    followed by Dr. Sherren Mocha Early  . Adenomatous colon polyp 1998  . Arthritis   . Blind left eye   . CAD (coronary artery disease) 08/1995   50-55% second diag dz.; occluded OM  . Complication of anesthesia    "Blood pressure went down" with retinal surgery - no problems with previous surgeries  . Echocardiogram abnormal 03/2012   mild to mod AS, valve area1.58 cm2, mild concentric LVH, grade I diastolic dusfunction  . Gout   . Heart murmur   . Hyperlipidemia   . Hypertension   . Myocardial infarction [facility])    mild/ age 50  . PAD (peripheral artery disease) (HCC)    mild to mod Rt ICA stenosis followed by doppler-02/05/12  . Pelvic mass   . Stroke [facility]) 2001   no residual problems  . Tobacco abuse   . Ulcer    Past Surgical History:  Procedure Laterality Date  . ABDOMINAL  HYSTERECTOMY    . APPENDECTOMY    . CARDIAC CATHETERIZATION  08/1995   50-55% second diag lesion, occluded OM  . CATARACT EXTRACTION W/ INTRAOCULAR LENS  IMPLANT, BILATERAL    . COLONOSCOPY    . EYE SURGERY     lense implant removed/ blind in left eye  . LAPAROTOMY N/A 04/26/2014   Procedure: EXPLORATORY LAPAROTOMY/RESECTION OF PELVIC MASS;  Surgeon: Alvino Chapel, MD;  Location: [facility] ORS;  Service: Gynecology;  Laterality: N/A;  . OOPHORECTOMY     and fallopian tube  . POLYPECTOMY    . RETINAL DETACHMENT SURGERY  2006  . TONSILLECTOMY AND ADENOIDECTOMY    . TUBAL LIGATION    . UPPER GASTROINTESTINAL ENDOSCOPY     treated for h-pylori   Family History  Problem Relation Age of Onset  . Aneurysm Mother        AAA  . Hypertension Mother   . Hyperlipidemia Mother   . Heart disease Father        CAD, MI x 3  . Heart attack Father   . Cancer Maternal Aunt        breast cancer  . Diabetes Sister   . Hyperlipidemia Sister   . Hypertension Sister   . Colon cancer Neg Hx   .  Stomach cancer Neg Hx    History  Sexual Activity  . Sexual activity: No    Outpatient Encounter Prescriptions as of 02/04/2017  Medication Sig  . allopurinol (ZYLOPRIM) 100 MG tablet TAKE 1 TABLET BY MOUTH  DAILY  . amLODipine (NORVASC) 10 MG tablet TAKE 1 TABLET BY MOUTH  DAILY  . aspirin EC 81 MG tablet Take 81 mg by mouth daily.  Marland Kitchen atorvastatin (LIPITOR) 20 MG tablet TAKE 1 TABLET BY MOUTH  EVERY DAY  . cholecalciferol (VITAMIN D) 1000 units tablet Take 1,000 Units by mouth daily.  . colchicine 0.6 MG tablet Take 1 tablet (0.6 mg total) by mouth 4 (four) times daily as needed.  . dorzolamide-timolol (COSOPT) 22.3-6.8 MG/ML ophthalmic solution Place 1 drop into the left eye daily. (Patient taking differently: Place 1 drop into the left eye daily. 2-0.5% sol 10.0  One drop Left eye daily)  . furosemide (LASIX) 20 MG tablet TAKE 1-2 TABLETS BY MOUTH  DAILY AS NEEDED FOR EDEMA.  . hydrochlorothiazide  (MICROZIDE) 12.5 MG capsule Take 1 capsule (12.5 mg total) by mouth daily.  Marland Kitchen levothyroxine (SYNTHROID, LEVOTHROID) 50 MCG tablet Take 1 tablet (50 mcg total) by mouth daily.  . metoprolol (LOPRESSOR) 50 MG tablet TAKE ONE-HALF TABLET BY  MOUTH TWICE A DAY  . Polyethylene Glycol 3350 POWD as needed.   Marland Kitchen telmisartan (MICARDIS) 40 MG tablet Take 1 tablet (40 mg total) by mouth daily.  Marland Kitchen tiZANidine (ZANAFLEX) 4 MG tablet Take 1 tablet (4 mg total) by mouth every 8 (eight) hours as needed for muscle spasms.  . traMADol (ULTRAM) 50 MG tablet Take 1 tablet (50 mg total) by mouth every 8 (eight) hours as needed.   No facility-administered encounter medications on file as of 02/04/2017.     Activities of Daily Living In your present state of health, do you have any difficulty performing the following activities: 02/04/2017  Hearing? N  Vision? N  Difficulty concentrating or making decisions? N  Walking or climbing stairs? N  Dressing or bathing? N  Doing errands, shopping? N  Preparing Food and eating ? N  Using the Toilet? N  In the past six months, have you accidently leaked urine? Y  Comment leaks at times  Do you have problems with loss of bowel control? N  Managing your Medications? N  Managing your Finances? N  Housekeeping or managing your Housekeeping? N  Some recent data might be hidden    Patient Care Team: Plotnikov, Evie Lacks, MD as PCP - General (Internal Medicine) Bond, Tracie Harrier, MD as Referring Physician (Ophthalmology) Gerarda Fraction, MD as Referring Physician (Ophthalmology) Lorretta Harp, MD as Consulting Physician (Cardiology) Marti Sleigh, MD as Attending Physician (Gynecology) Early, Arvilla Meres, MD as Consulting Physician (Vascular Surgery)    Assessment:    Physical assessment deferred to PCP.  Exercise Activities and Dietary recommendations Current Exercise Habits: Home exercise routine, Type of exercise: walking, Time (Minutes): 25, Frequency  (Times/Week): 5, Weekly Exercise (Minutes/Week): 125, Intensity: Mild, Exercise limited by: None identified  Diet (meal preparation, eat out, water intake, caffeinated beverages, dairy products, fruits and vegetables): in general, a "healthy" diet  , on average, 1 meals per day , 1-2 cups of coffee daily, 3-4 glasses of water daily  Reviewed heart healthy and diabetic diet, discussed eating more frequently and supplementing (coupons provided), encouraged patient to increase daily water intake.     Goals    . Enjoy life, shop, travel, spend time with family and  grandchildren      Fall Risk Fall Risk  02/04/2017 01/30/2016 08/30/2014  Falls in the past year? No No No   Depression Screen PHQ 2/9 Scores 02/04/2017 01/30/2016 08/30/2014  PHQ - 2 Score 0 0 0  PHQ- 9 Score 0 - -     Cognitive Function MMSE - Mini Mental State Exam 02/04/2017  Orientation to time 5  Orientation to Place 5  Registration 3  Attention/ Calculation 4  Recall 1  Language- name 2 objects 2  Language- repeat 1  Language- follow 3 step command 3  Language- read & follow direction 1  Write a sentence 1  Copy design 1  Total score 27        Immunization History  Administered Date(s) Administered  . Influenza Split 03/18/2012  . Influenza Whole 03/04/2009  . Influenza,inj,Quad PF,6+ Mos 04/13/2014, 01/30/2016  . Influenza-Unspecified 03/03/2013, 05/09/2015  . Pneumococcal Conjugate-13 08/30/2014  . Pneumococcal Polysaccharide-23 11/17/2008  . Td 11/17/2008  . Zoster 11/21/2009   Screening Tests Health Maintenance  Topic Date Due  . DEXA SCAN  10/02/2000  . INFLUENZA VACCINE  01/01/2017  . TETANUS/TDAP  04/05/2026  . PNA vac Low Risk Adult  Completed      Plan:    Continue to eat heart healthy diet (full of fruits, vegetables, whole grains, lean protein, water--limit salt, fat, and sugar intake) and increase physical activity as tolerated.  Continue doing brain stimulating activities (puzzles,  reading, adult coloring books, staying active) to keep memory sharp.   I have personally reviewed and noted the following in the patient's chart:   . Medical and social history . Use of alcohol, tobacco or illicit drugs  . Current medications and supplements . Functional ability and status . Nutritional status . Physical activity . Advanced directives . List of other physicians . Vitals . Screenings to include cognitive, depression, and falls . Referrals and appointments  In addition, I have reviewed and discussed with patient certain preventive protocols, quality metrics, and best practice recommendations. A written personalized care plan for preventive services as well as general preventive health recommendations were provided to patient.     Michiel Cowboy, RN  02/04/2017  Medical screening examination/treatment/procedure(s) were performed by non-physician practitioner and as supervising physician I was immediately available for consultation/collaboration. I agree with above. [name], MD

## 2017-02-02 ENCOUNTER — Other Ambulatory Visit: Payer: Self-pay | Admitting: Internal Medicine

## 2017-02-04 ENCOUNTER — Other Ambulatory Visit: Payer: Self-pay | Admitting: General Practice

## 2017-02-04 ENCOUNTER — Ambulatory Visit (INDEPENDENT_AMBULATORY_CARE_PROVIDER_SITE_OTHER): Payer: Medicare Other | Admitting: *Deleted

## 2017-02-04 VITALS — BP 136/68 | HR 56 | Resp 20 | Ht 64.0 in | Wt 146.0 lb

## 2017-02-04 DIAGNOSIS — Z23 Encounter for immunization: Secondary | ICD-10-CM

## 2017-02-04 DIAGNOSIS — Z0000 Encounter for general adult medical examination without abnormal findings: Secondary | ICD-10-CM | POA: Diagnosis not present

## 2017-02-04 MED ORDER — ALLOPURINOL 100 MG PO TABS: 100.0000 mg | ORAL_TABLET | Freq: Every day | ORAL | 0 refills | Status: DC

## 2017-02-04 MED ORDER — METOPROLOL TARTRATE 50 MG PO TABS: 25.0000 mg | ORAL_TABLET | Freq: Two times a day (BID) | ORAL | 0 refills | Status: DC

## 2017-02-04 NOTE — Patient Instructions (Addendum)
Continue to eat heart healthy diet (full of fruits, vegetables, whole grains, lean protein, water--limit salt, fat, and sugar intake) and increase physical activity as tolerated.  Continue doing brain stimulating activities (puzzles, reading, adult coloring books, staying active) to keep memory sharp.    Tammy Brooks , Thank you for taking time to come for your Medicare Wellness Visit. I appreciate your ongoing commitment to your health goals. Please review the following plan we discussed and let me know if I can assist you in the future.   These are the goals we discussed: Goals    . Enjoy life, shop, travel, spend time with family and grandchildren       This is a list of the screening recommended for you and due dates:  Health Maintenance  Topic Date Due  . DEXA scan (bone density measurement)  10/02/2000  . Flu Shot  01/01/2017  . Tetanus Vaccine  04/05/2026  . Pneumonia vaccines  Completed   Influenza Virus Vaccine (Flucelvax) What is this medicine? INFLUENZA VIRUS VACCINE (in floo EN zuh VAHY ruhs vak SEEN) helps to reduce the risk of getting influenza also known as the flu. The vaccine only helps protect you against some strains of the flu. This medicine may be used for other purposes; ask your health care provider or pharmacist if you have questions. COMMON BRAND NAME(S): FLUCELVAX What should I tell my health care provider before I take this medicine? They need to know if you have any of these conditions: -bleeding disorder like hemophilia -fever or infection -Guillain-Barre syndrome or other neurological problems -immune system problems -infection with the human immunodeficiency virus (HIV) or AIDS -low blood platelet counts -multiple sclerosis -an unusual or allergic reaction to influenza virus vaccine, other medicines, foods, dyes or preservatives -pregnant or trying to get pregnant -breast-feeding How should I use this medicine? This vaccine is for injection into a  muscle. It is given by a health care professional. A copy of Vaccine Information Statements will be given before each vaccination. Read this sheet carefully each time. The sheet may change frequently. Talk to your pediatrician regarding the use of this medicine in children. Special care may be needed. Overdosage: If you think you've taken too much of this medicine contact a poison control center or emergency room at once. Overdosage: If you think you have taken too much of this medicine contact a poison control center or emergency room at once. NOTE: This medicine is only for you. Do not share this medicine with others. What if I miss a dose? This does not apply. What may interact with this medicine? -chemotherapy or radiation therapy -medicines that lower your immune system like etanercept, anakinra, infliximab, and adalimumab -medicines that treat or prevent blood clots like warfarin -phenytoin -steroid medicines like prednisone or cortisone -theophylline -vaccines This list may not describe all possible interactions. Give your health care provider a list of all the medicines, herbs, non-prescription drugs, or dietary supplements you use. Also tell them if you smoke, drink alcohol, or use illegal drugs. Some items may interact with your medicine. What should I watch for while using this medicine? Report any side effects that do not go away within 3 days to your doctor or health care professional. Call your health care provider if any unusual symptoms occur within 6 weeks of receiving this vaccine. You may still catch the flu, but the illness is not usually as bad. You cannot get the flu from the vaccine. The vaccine will not protect against  colds or other illnesses that may cause fever. The vaccine is needed every year. What side effects may I notice from receiving this medicine? Side effects that you should report to your doctor or health care professional as soon as possible: -allergic  reactions like skin rash, itching or hives, swelling of the face, lips, or tongue Side effects that usually do not require medical attention (Report these to your doctor or health care professional if they continue or are bothersome.): -fever -headache -muscle aches and pains -pain, tenderness, redness, or swelling at the injection site -tiredness This list may not describe all possible side effects. Call your doctor for medical advice about side effects. You may report side effects to FDA at 1-800-FDA-1088. Where should I keep my medicine? The vaccine will be given by a health care professional in a clinic, pharmacy, doctor's office, or other health care setting. You will not be given vaccine doses to store at home. NOTE: This sheet is a summary. It may not cover all possible information. If you have questions about this medicine, talk to your doctor, pharmacist, or health care provider.  2018 Elsevier/Gold Standard (2011-05-01 14:06:47)  It is important to avoid accidents which may result in broken bones.  Here are a few ideas on how to make your home safer so you will be less likely to trip or fall.  1. Use nonskid mats or non slip strips in your shower or tub, on your bathroom floor and around sinks.  If you know that you have spilled water, wipe it up! 2. In the bathroom, it is important to have properly installed grab bars on the walls or on the edge of the tub.  Towel racks are NOT strong enough for you to hold onto or to pull on for support. 3. Stairs and hallways should have enough light.  Add lamps or night lights if you need ore light. 4. It is good to have handrails on both sides of the stairs if possible.  Always fix broken handrails right away. 5. It is important to see the edges of steps.  Paint the edges of outdoor steps white so you can see them better.  Put colored tape on the edge of inside steps. 6. Throw-rugs are dangerous because they can slide.  Removing the rugs is the  best idea, but if they must stay, add adhesive carpet tape to prevent slipping. 7. Do not keep things on stairs or in the halls.  Remove small furniture that blocks the halls as it may cause you to trip.  Keep telephone and electrical cords out of the way where you walk. 8. Always were sturdy, rubber-soled shoes for good support.  Never wear just socks, especially on the stairs.  Socks may cause you to slip or fall.  Do not wear full-length housecoats as you can easily trip on the bottom.  9. Place the things you use the most on the shelves that are the easiest to reach.  If you use a stepstool, make sure it is in good condition.  If you feel unsteady, DO NOT climb, ask for help. 10. If a health professional advises you to use a cane or walker, do not be ashamed.  These items can keep you from falling and breaking your bones.

## 2017-02-09 NOTE — Telephone Encounter (Signed)
OK to interrupt antiplatelet Rx for hernia surgery

## 2017-02-21 ENCOUNTER — Encounter: Payer: Self-pay | Admitting: Internal Medicine

## 2017-02-21 ENCOUNTER — Ambulatory Visit (INDEPENDENT_AMBULATORY_CARE_PROVIDER_SITE_OTHER): Payer: Medicare Other | Admitting: Internal Medicine

## 2017-02-21 DIAGNOSIS — E785 Hyperlipidemia, unspecified: Secondary | ICD-10-CM | POA: Diagnosis not present

## 2017-02-21 DIAGNOSIS — R600 Localized edema: Secondary | ICD-10-CM

## 2017-02-21 DIAGNOSIS — I10 Essential (primary) hypertension: Secondary | ICD-10-CM | POA: Diagnosis not present

## 2017-02-21 DIAGNOSIS — M10079 Idiopathic gout, unspecified ankle and foot: Secondary | ICD-10-CM

## 2017-02-21 MED ORDER — ALLOPURINOL 100 MG PO TABS: 100.0000 mg | ORAL_TABLET | Freq: Every day | ORAL | 3 refills | Status: DC

## 2017-02-21 MED ORDER — VALSARTAN 160 MG PO TABS: 160.0000 mg | ORAL_TABLET | Freq: Two times a day (BID) | ORAL | 3 refills | Status: DC

## 2017-02-21 NOTE — Assessment & Plan Note (Signed)
Allopurinol  

## 2017-02-21 NOTE — Assessment & Plan Note (Addendum)
BP Readings from Last 3 Encounters:  02/21/17 132/82  02/04/17 136/68  01/07/17 (!) 132/58  back on Valsartan

## 2017-02-21 NOTE — Assessment & Plan Note (Signed)
resolved 

## 2017-02-21 NOTE — Progress Notes (Signed)
Subjective:  Patient ID: Tammy Brooks, female    DOB: 05-19-1934  Age: 83 y.o. MRN: [id number]  CC: No chief complaint on file.   HPI Tammy Brooks presents for HTN, CAD, dyslipidemia f/u. F/u OA/gout - doing better. Off Meloxicam  Outpatient Medications Prior to Visit  Medication Sig Dispense Refill  . allopurinol (ZYLOPRIM) 100 MG tablet Take 1 tablet (100 mg total) by mouth daily. 90 tablet 0  . amLODipine (NORVASC) 10 MG tablet TAKE 1 TABLET BY MOUTH  DAILY 60 tablet 1  . aspirin EC 81 MG tablet Take 81 mg by mouth daily.    Marland Kitchen atorvastatin (LIPITOR) 20 MG tablet TAKE 1 TABLET BY MOUTH  EVERY DAY 90 tablet 3  . cholecalciferol (VITAMIN D) 1000 units tablet Take 1,000 Units by mouth daily.    . colchicine 0.6 MG tablet Take 1 tablet (0.6 mg total) by mouth 4 (four) times daily as needed. 360 tablet 2  . dorzolamide-timolol (COSOPT) 22.3-6.8 MG/ML ophthalmic solution Place 1 drop into the left eye daily. (Patient taking differently: Place 1 drop into the left eye daily. 2-0.5% sol 10.0  One drop Left eye daily) 10 mL 3  . furosemide (LASIX) 20 MG tablet TAKE 1-2 TABLETS BY MOUTH  DAILY AS NEEDED FOR EDEMA. 180 tablet 1  . hydrochlorothiazide (MICROZIDE) 12.5 MG capsule Take 1 capsule (12.5 mg total) by mouth daily. 90 capsule 3  . levothyroxine (SYNTHROID, LEVOTHROID) 50 MCG tablet Take 1 tablet (50 mcg total) by mouth daily. 90 tablet 3  . metoprolol tartrate (LOPRESSOR) 50 MG tablet Take 0.5 tablets (25 mg total) by mouth 2 (two) times daily. 90 tablet 0  . Polyethylene Glycol 3350 POWD as needed.     Marland Kitchen tiZANidine (ZANAFLEX) 4 MG tablet Take 1 tablet (4 mg total) by mouth every 8 (eight) hours as needed for muscle spasms. 90 tablet 2  . traMADol (ULTRAM) 50 MG tablet Take 1 tablet (50 mg total) by mouth every 8 (eight) hours as needed. 100 tablet 2  . telmisartan (MICARDIS) 40 MG tablet Take 1 tablet (40 mg total) by mouth daily. (Patient not taking: Reported on 02/21/2017) 90 tablet 3    No facility-administered medications prior to visit.     ROS Review of Systems  Constitutional: Positive for fatigue. Negative for activity change, appetite change, chills and unexpected weight change.  HENT: Negative for congestion, mouth sores and sinus pressure.   Eyes: Negative for visual disturbance.  Respiratory: Negative for cough and chest tightness.   Gastrointestinal: Negative for abdominal pain and nausea.  Genitourinary: Negative for difficulty urinating, frequency and vaginal pain.  Musculoskeletal: Positive for arthralgias. Negative for back pain and gait problem.  Skin: Negative for pallor and rash.  Neurological: Positive for tremors. Negative for dizziness, weakness, numbness and headaches.  Psychiatric/Behavioral: Negative for confusion and sleep disturbance.    Objective:  BP 132/82 (BP Location: Left Arm, Patient Position: Sitting, Cuff Size: Normal)   Pulse (!) 48   Temp 97.9 F (36.6 C) (Oral)   Ht 5\' 4"  (1.626 m)   Wt 144 lb (65.3 kg)   SpO2 98%   BMI 24.72 kg/m   BP Readings from Last 3 Encounters:  02/21/17 132/82  02/04/17 136/68  01/07/17 (!) 132/58    Wt Readings from Last 3 Encounters:  02/21/17 144 lb (65.3 kg)  02/04/17 146 lb (66.2 kg)  01/07/17 144 lb 9.6 oz (65.6 kg)    Physical Exam  Constitutional: She appears well-developed.  No distress.  HENT:  Head: Normocephalic.  Right Ear: External ear normal.  Left Ear: External ear normal.  Nose: Nose normal.  Mouth/Throat: Oropharynx is clear and moist.  Eyes: Pupils are equal, round, and reactive to light. Conjunctivae are normal. Right eye exhibits no discharge. Left eye exhibits no discharge.  Neck: Normal range of motion. Neck supple. No JVD present. No tracheal deviation present. No thyromegaly present.  Cardiovascular: Normal rate, regular rhythm and normal heart sounds.   Pulmonary/Chest: No stridor. No respiratory distress. She has no wheezes.  Abdominal: Soft. Bowel sounds  are normal. She exhibits no distension and no mass. There is no tenderness. There is no rebound and no guarding.  Musculoskeletal: She exhibits no edema or tenderness.  Lymphadenopathy:    She has no cervical adenopathy.  Neurological: She displays normal reflexes. No cranial nerve deficit. She exhibits normal muscle tone. Coordination normal.  Skin: No rash noted. No erythema.  Psychiatric: She has a normal mood and affect. Her behavior is normal. Judgment and thought content normal.    Lab Results  Component Value Date   WBC 8.2 12/18/2016   HGB 14.4 12/18/2016   HCT 44.1 12/18/2016   PLT 286.0 12/18/2016   GLUCOSE 110 (H) 12/18/2016   CHOL 143 02/01/2016   TRIG 141.0 02/01/2016   HDL 31.60 (L) 02/01/2016   LDLDIRECT 76.7 11/17/2008   LDLCALC 83 02/01/2016   ALT 13 12/18/2016   AST 18 12/18/2016   NA 138 12/18/2016   K 4.5 12/18/2016   CL 105 12/18/2016   CREATININE 1.10 12/18/2016   BUN 19 12/18/2016   CO2 27 12/18/2016   TSH 7.63 (H) 12/18/2016    No results found.  Assessment & Plan:   There are no diagnoses linked to this encounter. I am having [name] maintain her aspirin EC, colchicine, dorzolamide-timolol, Polyethylene Glycol 3350, furosemide, cholecalciferol, atorvastatin, tiZANidine, hydrochlorothiazide, telmisartan, traMADol, levothyroxine, amLODipine, allopurinol, and metoprolol tartrate.  No orders of the defined types were placed in this encounter.    Follow-up: No Follow-up on file.  Walker Kehr, MD

## 2017-02-21 NOTE — Assessment & Plan Note (Signed)
Simvastatin

## 2017-02-24 NOTE — Telephone Encounter (Signed)
Clearance routed to number provided via EPIC.

## 2017-04-02 ENCOUNTER — Other Ambulatory Visit: Payer: Self-pay | Admitting: Cardiovascular Disease

## 2017-04-02 NOTE — Telephone Encounter (Signed)
REFILL 

## 2017-05-08 DIAGNOSIS — K432 Incisional hernia without obstruction or gangrene: Secondary | ICD-10-CM | POA: Diagnosis not present

## 2017-05-09 ENCOUNTER — Other Ambulatory Visit: Payer: Self-pay | Admitting: Internal Medicine

## 2017-05-16 ENCOUNTER — Telehealth: Payer: Self-pay | Admitting: Internal Medicine

## 2017-05-16 MED ORDER — TELMISARTAN 40 MG PO TABS: 40.0000 mg | ORAL_TABLET | Freq: Every day | ORAL | 3 refills | Status: DC

## 2017-05-16 NOTE — Telephone Encounter (Signed)
Please advise about alternative in Dr. Enis Slipper absence.  Pharmacy: [facility]

## 2017-05-16 NOTE — Telephone Encounter (Signed)
Pt.notified

## 2017-05-16 NOTE — Telephone Encounter (Signed)
Ok to change the valsartan to micardis which I similar but not subject to recall - done erx

## 2017-05-16 NOTE — Telephone Encounter (Signed)
Copied from [facility]. Topic: Inquiry >> May 15, 2017  4:03 PM Conception Chancy, NT wrote: Reason for CRM: patient states her Valsartan has been recalled she will receive that package tomorrow and her pharmacy will send her a return package so that she can return the medication. She would like to know if there is something else she can take. Please contact patient has she has already contacted her pharmacy.

## 2017-06-26 ENCOUNTER — Other Ambulatory Visit: Payer: Self-pay | Admitting: Internal Medicine

## 2017-06-27 ENCOUNTER — Telehealth: Payer: Self-pay | Admitting: *Deleted

## 2017-06-27 ENCOUNTER — Other Ambulatory Visit: Payer: Self-pay | Admitting: Gynecologic Oncology

## 2017-06-27 DIAGNOSIS — R1900 Intra-abdominal and pelvic swelling, mass and lump, unspecified site: Secondary | ICD-10-CM

## 2017-06-27 NOTE — Telephone Encounter (Signed)
Called the patient and scheduled an appt for FEbruary 12th.

## 2017-07-03 ENCOUNTER — Encounter: Payer: Self-pay | Admitting: Family

## 2017-07-03 ENCOUNTER — Ambulatory Visit (HOSPITAL_COMMUNITY)
Admission: RE | Admit: 2017-07-03 | Discharge: 2017-07-03 | Disposition: A | Discharge status: Home / Self Care | Payer: Medicare Other | Source: Ambulatory Visit | Attending: Vascular Surgery | Admitting: Vascular Surgery

## 2017-07-03 ENCOUNTER — Other Ambulatory Visit: Payer: Self-pay

## 2017-07-03 ENCOUNTER — Ambulatory Visit: Payer: Medicare Other | Admitting: Family

## 2017-07-03 VITALS — BP 118/61 | HR 58 | Resp 16 | Ht 63.0 in | Wt 140.0 lb

## 2017-07-03 DIAGNOSIS — I714 Abdominal aortic aneurysm, without rupture, unspecified: Secondary | ICD-10-CM

## 2017-07-03 DIAGNOSIS — I723 Aneurysm of iliac artery: Secondary | ICD-10-CM | POA: Diagnosis not present

## 2017-07-03 DIAGNOSIS — Z8249 Family history of ischemic heart disease and other diseases of the circulatory system: Secondary | ICD-10-CM | POA: Diagnosis not present

## 2017-07-03 DIAGNOSIS — F17200 Nicotine dependence, unspecified, uncomplicated: Secondary | ICD-10-CM

## 2017-07-03 NOTE — Patient Instructions (Signed)
Abdominal Aortic Aneurysm Blood pumps away from the heart through tubes (blood vessels) called arteries. Aneurysms are weak or damaged places in the wall of an artery. It bulges out like a balloon. An abdominal aortic aneurysm happens in the main artery of the body (aorta). It can burst or tear, causing bleeding inside the body. This is an emergency. It needs treatment right away. What are the causes? The exact cause is unknown. Things that could cause this problem include:  Fat and other substances building up in the lining of a tube.  Swelling of the walls of a blood vessel.  Certain tissue diseases.  Belly (abdominal) trauma.  An infection in the main artery of the body.  What increases the risk? There are things that make it more likely for you to have an aneurysm. These include:  Being over the age of 82 years old.  Having high blood pressure (hypertension).  Being a female.  Being white.  Being very overweight (obese).  Having a family history of aneurysm.  Using tobacco products.  What are the signs or symptoms? Symptoms depend on the size of the aneurysm and how fast it grows. There may not be symptoms. If symptoms occur, they can include:  Pain (belly, side, lower back, or groin).  Feeling full after eating a small amount of food.  Feeling sick to your stomach (nauseous), throwing up (vomiting), or both.  Feeling a lump in your belly that feels like it is beating (pulsating).  Feeling like you will pass out (faint).  How is this treated?  Medicine to control blood pressure and pain.  Imaging tests to see if the aneurysm gets bigger.  Surgery. How is this prevented? To lessen your chance of getting this condition:  Stop smoking. Stop chewing tobacco.  Limit or avoid alcohol.  Keep your blood pressure, blood sugar, and cholesterol within normal limits.  Eat less salt.  Eat foods low in saturated fats and cholesterol. These are found in animal and  whole dairy products.  Eat more fiber. Fiber is found in whole grains, vegetables, and fruits.  Keep a healthy weight.  Stay active and exercise often.  This information is not intended to replace advice given to you by your health care provider. Make sure you discuss any questions you have with your health care provider. Document Released: 09/14/2012 Document Revised: 10/26/2015 Document Reviewed: 06/19/2012 Elsevier Interactive Patient Education  2017 [employer].     Steps to Quit Smoking Smoking tobacco can be bad for your health. It can also affect almost every organ in your body. Smoking puts you and people around you at risk for many serious long-lasting (chronic) diseases. Quitting smoking is hard, but it is one of the best things that you can do for your health. It is never too late to quit. What are the benefits of quitting smoking? When you quit smoking, you lower your risk for getting serious diseases and conditions. They can include:  Lung cancer or lung disease.  Heart disease.  Stroke.  Heart attack.  Not being able to have children (infertility).  Weak bones (osteoporosis) and broken bones (fractures).  If you have coughing, wheezing, and shortness of breath, those symptoms may get better when you quit. You may also get sick less often. If you are pregnant, quitting smoking can help to lower your chances of having a baby of low birth weight. What can I do to help me quit smoking? Talk with your doctor about what can help  you quit smoking. Some things you can do (strategies) include:  Quitting smoking totally, instead of slowly cutting back how much you smoke over a period of time.  Going to in-person counseling. You are more likely to quit if you go to many counseling sessions.  Using resources and support systems, such as: ? Database administrator with a Social worker. ? Phone quitlines. ? Careers information officer. ? Support groups or group counseling. ? Text  messaging programs. ? Mobile phone apps or applications.  Taking medicines. Some of these medicines may have nicotine in them. If you are pregnant or breastfeeding, do not take any medicines to quit smoking unless your doctor says it is okay. Talk with your doctor about counseling or other things that can help you.  Talk with your doctor about using more than one strategy at the same time, such as taking medicines while you are also going to in-person counseling. This can help make quitting easier. What things can I do to make it easier to quit? Quitting smoking might feel very hard at first, but there is a lot that you can do to make it easier. Take these steps:  Talk to your family and friends. Ask them to support and encourage you.  Call phone quitlines, reach out to support groups, or work with a Social worker.  Ask people who smoke to not smoke around you.  Avoid places that make you want (trigger) to smoke, such as: ? Bars. ? Parties. ? Smoke-break areas at work.  Spend time with people who do not smoke.  Lower the stress in your life. Stress can make you want to smoke. Try these things to help your stress: ? Getting regular exercise. ? Deep-breathing exercises. ? Yoga. ? Meditating. ? Doing a body scan. To do this, close your eyes, focus on one area of your body at a time from head to toe, and notice which parts of your body are tense. Try to relax the muscles in those areas.  Download or buy apps on your mobile phone or tablet that can help you stick to your quit plan. There are many free apps, such as QuitGuide from the [employer] Office manager for Disease Control and Prevention). You can find more support from smokefree.gov and other websites.  This information is not intended to replace advice given to you by your health care provider. Make sure you discuss any questions you have with your health care provider. Document Released: 03/16/2009 Document Revised: 01/16/2016 Document Reviewed:  10/04/2014 Elsevier Interactive Patient Education  2018 [employer].

## 2017-07-03 NOTE — Progress Notes (Signed)
[facility] [place]   CC: Follow up Abdominal Aortic Aneurysm  History of Present Illness  Tammy Brooks is a 84 y.o. (01-Mar-1934) female patient of Dr. Donnetta Hutching who returns today for surveillance and discussion of AAA. At her October 2015 visit with Dr. 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 There was also a new finding of a 9 cm pelvic mass. Radiologist interpretation is concerning for ovarian malignancy. Dr. Donnetta Hutching discussed this with her internist Dr Alain Marion, who was to coordinate referral to a gyn specialist. She had a possible abdominal BSO, in November 2015 for a pelvic mass, pt states this was not cancer. Pt states she had previously had a vaginal hysterectomy.  Dr. Donnetta Hutching last evaluated pt on 12-24-16. At that time CT scan showed no significant change compared to her 2015 study. Maximal aortic diameter at that time was felt to be 4.3 compared to 4.6 cm on this date. She had extension directly into her proximal left iliac artery and this is unchanged at 3.5. Her more distal common iliac and external and internal iliac arteries were otherwise normal. She had mural thrombus in the aneurysm that initiates just below her renal arteries Stable moderate size abdominal aortic aneurysm and left common iliac artery aneurysm. Dr. Donnetta Hutching explained that her option of repair would be open surgery since she is not a stent graft candidate related to her aneurysmal anatomy. Dr. Donnetta Hutching felt that her risk for rupture was much smaller than her risk for surgery at that time. Dr. Donnetta Hutching advised 6 month follow up with ultrasound to rule out enlargement. Her CT scan showed a 1 cm lesion in her liver that the radiologist interpretation was that this was probably present in 2015 and would recommend further evaluation if she was at high risk for malignancy which she does not appear to be. She is comfortable with observation only. Regarding her  ventral incisional hernia, she did see someone with [facility] who declined surgical treatment since she was a smoker.   She has a known lower abdominal hernia since the above surgery and this is being followed by one of her providers, this is mildly painful. Pt states she has inflammation in her low back and both hips, has pain in these areas, denies radiculopathy type pain. Pt states her mother and maternal aunt had a AAA.  She denies chronic cough or chronic constipation.   The patient denies claudication symptoms in legs with walking. She reports a history of stroke in 2000 as manifested by syncope, from this event denies any residual neurological deficit. Pt states Dr. Gwenlyn Found regularly checks her carotid arteries by ultrasound; December 2016 carotid duplex showed <40% bilateral ICA stenosis (review of records).  She is blind in her left eye which she attributes as related to cataract surgery.  Pt states her gynecologist, Dr. Adan Sis who performed the BSO and tumor removal asked pt to ask our practice for a referal to a surgeon for repair of an abdominal hernia below the mid abdominal incision. Pt was referred to [place] [facility] for repair of LLQ abdominal hernia; pt states the surgeon told her that when she quits smoking he would repair this hernia.   Diabetic: No Tobacco use: smoker (1 ppd, started at age 56 yrs). Pt states she "can't quit".     Past Medical History:  Diagnosis Date  . Abdominal aortic aneurysm ([place])    followed by Dr. Sherren Mocha Early  .  Adenomatous colon polyp 1998  . Arthritis   . Blind left eye   . CAD (coronary artery disease) 08/1995   50-55% second diag dz.; occluded OM  . Complication of anesthesia    "Blood pressure went down" with retinal surgery - no problems with previous surgeries  . Echocardiogram abnormal 03/2012   mild to mod AS, valve area1.58 cm2, mild concentric LVH, grade I diastolic dusfunction  . Gout   . Heart murmur    . Hyperlipidemia   . Hypertension   . Myocardial infarction [facility])    mild/ age 37  . PAD (peripheral artery disease) ([facility])    mild to mod Rt ICA stenosis followed by doppler-02/05/12  . Pelvic mass   . Stroke [facility]) 2001   no residual problems  . Tobacco abuse   . Ulcer    Past Surgical History:  Procedure Laterality Date  . ABDOMINAL HYSTERECTOMY    . APPENDECTOMY    . CARDIAC CATHETERIZATION  08/1995   50-55% second diag lesion, occluded OM  . CATARACT EXTRACTION W/ INTRAOCULAR LENS  IMPLANT, BILATERAL    . COLONOSCOPY    . EYE SURGERY     lense implant removed/ blind in left eye  . LAPAROTOMY N/A 04/26/2014   Procedure: EXPLORATORY LAPAROTOMY/RESECTION OF PELVIC MASS;  Surgeon: Alvino Chapel, MD;  Location: [facility];  Service: Gynecology;  Laterality: N/A;  . OOPHORECTOMY     and fallopian tube  . POLYPECTOMY    . RETINAL DETACHMENT SURGERY  2006  . TONSILLECTOMY AND ADENOIDECTOMY    . TUBAL LIGATION    . UPPER GASTROINTESTINAL ENDOSCOPY     treated for h-pylori   Social History Social History   Socioeconomic History  . Marital status: Widowed    Spouse name: Not on file  . Number of children: 5  . Years of education: 9  . Highest education level: Not on file  Social Needs  . Financial resource strain: Not on file  . Food insecurity - worry: Not on file  . Food insecurity - inability: Not on file  . Transportation needs - medical: Not on file  . Transportation needs - non-medical: Not on file  Occupational History  . Occupation: Oncologist    Comment: retired  Tobacco Use  . Smoking status: Current Every Day Smoker    Packs/day: 1.00    Years: 62.00    Pack years: 62.00    Types: Cigarettes  . Smokeless tobacco: Never Used  Substance and Sexual Activity  . Alcohol use: Yes    Alcohol/week: 0.0 oz    Comment: occasionally - few drinks/month  . Drug use: No  . Sexual activity: No    Partners: Male    Birth control/protection: None, Surgical   Other Topics Concern  . Not on file  Social History Narrative   9th grade. married '54- widowed Dec 10th'08. 4 sons - '55, '58, '60, '69; 1 daughter - '63; 12 grandchildren; 9 great-grandchildren. 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 Other children are close and attentive. ACP/End-of-life: No CPR, no mechanical ventilation, no futile/heroic measures.   Family History Family History  Problem Relation Age of Onset  . Aneurysm Mother        AAA  . Hypertension Mother   . Hyperlipidemia Mother   . Heart disease Father        CAD, MI x 3  . Heart attack Father   . Cancer  Maternal Aunt        breast cancer  . Diabetes Sister   . Hyperlipidemia Sister   . Hypertension Sister   . Colon cancer Neg Hx   . Stomach cancer Neg Hx     Current Outpatient Medications on File Prior to Visit  Medication Sig Dispense Refill  . allopurinol (ZYLOPRIM) 100 MG tablet Take 1 tablet (100 mg total) by mouth daily. 90 tablet 3  . amLODipine (NORVASC) 10 MG tablet TAKE 1 TABLET BY MOUTH  DAILY 90 tablet 2  . aspirin EC 81 MG tablet Take 81 mg by mouth daily.    Marland Kitchen atorvastatin (LIPITOR) 20 MG tablet TAKE 1 TABLET BY MOUTH  EVERY DAY 90 tablet 3  . cholecalciferol (VITAMIN D) 1000 units tablet Take 1,000 Units by mouth daily.    . dorzolamide-timolol (COSOPT) 22.3-6.8 MG/ML ophthalmic solution Place 1 drop into the left eye daily. (Patient taking differently: Place 1 drop into the left eye daily. 2-0.5% sol 10.0  One drop Left eye daily) 10 mL 3  . furosemide (LASIX) 20 MG tablet TAKE 1-2 TABLETS BY MOUTH  DAILY AS NEEDED FOR EDEMA. 180 tablet 1  . levothyroxine (SYNTHROID, LEVOTHROID) 50 MCG tablet Take 1 tablet (50 mcg total) by mouth daily. 90 tablet 3  . metoprolol tartrate (LOPRESSOR) 50 MG tablet TAKE ONE-HALF TABLET BY  MOUTH TWO TIMES DAILY 90 tablet 1  . telmisartan (MICARDIS) 40 MG tablet Take 1 tablet (40 mg total) by mouth daily. 90 tablet 3  .  tiZANidine (ZANAFLEX) 4 MG tablet Take 1 tablet (4 mg total) by mouth every 8 (eight) hours as needed for muscle spasms. 90 tablet 2  . traMADol (ULTRAM) 50 MG tablet Take 1 tablet (50 mg total) by mouth every 8 (eight) hours as needed. 100 tablet 2  . colchicine 0.6 MG tablet Take 1 tablet (0.6 mg total) by mouth 4 (four) times daily as needed. (Patient not taking: Reported on 07/03/2017) 360 tablet 2  . hydrochlorothiazide (MICROZIDE) 12.5 MG capsule Take 1 capsule (12.5 mg total) by mouth daily. (Patient not taking: Reported on 07/03/2017) 90 capsule 3  . Polyethylene Glycol 3350 POWD as needed.      No current facility-administered medications on file prior to visit.    Allergies  Allergen Reactions  . Nabumetone Anaphylaxis and Other (See Comments)    Causes inflammation    ROS: See HPI for pertinent positives and negatives.  Physical Examination  Vitals:   07/03/17 0928 07/03/17 0934  BP:  118/61  Pulse:  (!) 58  Resp: 16   SpO2: 97%   Weight: 140 lb (63.5 kg)   Height: 5\' 3"  (1.6 m)    Body mass index is 24.8 kg/m.  General: A&O x 3 female in NAD. Gait: Normal HENT: No apparent abnormalities  Eyes: Left pupil is larger than right, is irregular in shape, and is non reactive to light. Pulmonary: Sym exp, mild use of accessory muscles with respirations, voice is slightly hoarse, fair air movement in all fields, no rales or rhonchi, no wheezes.  Cardiac: RRR, + murmur.   Carotid Bruits Right Left   transmitted cardiac murmur Transmitted cardiac murmur   Abdominal aortic pulse is not palpable Radial pulses are 2+ palpable and =   VASCULAR EXAM:     LE Pulses Right Left   FEMORAL  palpable  palpable    POPLITEAL not palpable  not palpable   POSTERIOR TIBIAL not  palpable  not palpable  DORSALIS PEDIS  ANTERIOR TIBIAL faintly palpable  1+ palpable     Gastrointestinal: soft, NTND, -G/R, - HSM, - CVAT B, Large amount of excess tissue at low mid abdomen that pt states is a hernia, midline well healed abdominal scar noted. Musculoskeletal: M/S 5/5 throughout, Extremities without ischemic changes. Skin: No rash, no cellulitis, no ulcers noted  Neurologic: CN 2-12 intact, Pain and light touch intact in extremities are intact except, Motor exam as listed above Psychiatric: Normal thought content, mood appropriate to clinical situation.    DATA  AAA Duplex (07/03/2017):  Previous size: 4.5 cm (Date: 07-02-16); Right CIA: 1.6 cm; Left CIA: 2.4 cm  Current size:  4.9 cm at the mid and distal abdominal aorta (Date: 07-03-17); Right CIA: 1.3 cm; Left CIA: 2.3 cm  Medical Decision Making  The patient is a 84 y.o. female who presents with asymptomatic AAA with increase in size to 4.9 cm today, from 4.5 cm on 07-02-16.  The patient was counseled re smoking cessation and given several free resources re smoking cessation.   Based on this patient's exam and diagnostic studies, and after discussing with Dr. Donnetta Hutching, the pt will be scheduled for CTA abd/pelvis in 6 months, see Dr. Donnetta Hutching afterward.   Consideration for repair of AAA would be made when the size is 5.0-5.5 cm, growth > 1 cm/yr, and symptomatic status.       Consideration for repair of common iliac artery aneurysm would be made when the size is 3.0 cm.         The patient was given information about AAA including signs, symptoms, treatment, and how to minimize the risk of enlargement and rupture of aneurysms.    I emphasized the importance of maximal medical management including strict control of blood pressure, blood glucose, and lipid levels, antiplatelet agents, obtaining regular exercise, and cessation of smoking.   The patient was advised to call 911 should the patient  experience sudden onset abdominal or back pain.   Thank you for allowing [place] to participate in this patient's care.  Clemon Chambers, RN, MSN, FNP-C [facility] [place] Office: [phone]  Clinic Physician: Fields/Early  07/03/2017, 9:39 AM

## 2017-07-03 NOTE — Progress Notes (Signed)
Vitals:   07/03/17 0928  Resp: 16  SpO2: 97%  Weight: 140 lb (63.5 kg)  Height: 5\' 3"  (1.6 m)

## 2017-07-07 ENCOUNTER — Other Ambulatory Visit: Payer: Self-pay

## 2017-07-07 DIAGNOSIS — I714 Abdominal aortic aneurysm, without rupture, unspecified: Secondary | ICD-10-CM

## 2017-07-15 ENCOUNTER — Encounter: Payer: Self-pay | Admitting: Gynecology

## 2017-07-15 ENCOUNTER — Inpatient Hospital Stay: Payer: Medicare Other

## 2017-07-15 ENCOUNTER — Inpatient Hospital Stay: Payer: Medicare Other | Attending: Gynecology | Admitting: Gynecology

## 2017-07-15 VITALS — BP 133/70 | HR 58 | Temp 97.7°F | Resp 18 | Wt 141.6 lb

## 2017-07-15 DIAGNOSIS — C569 Malignant neoplasm of unspecified ovary: Secondary | ICD-10-CM | POA: Diagnosis present

## 2017-07-15 DIAGNOSIS — R1900 Intra-abdominal and pelvic swelling, mass and lump, unspecified site: Secondary | ICD-10-CM

## 2017-07-15 DIAGNOSIS — D3911 Neoplasm of uncertain behavior of right ovary: Secondary | ICD-10-CM

## 2017-07-15 NOTE — Progress Notes (Signed)
Consult Note: Gyn-Onc   Tammy Brooks 84 y.o. female  Chief Complaint  Patient presents with  . 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 Ventral hernia at the lower pole of her incision (relatively asymptomatic).  Plan: Inhibin B and AMH are obtained today.. She return to see me in 6 months and we will repeat tumor markers at each visit.  Interval history: Patient returns today as previously scheduled. Overall she's doing well as she notes that her hernia is getting larger. It is relatively asymptomatic. She's had no evidence of bowel obstruction. She has seen a general surgeon who encouraged her to discontinue smoking before considering repair of the ventral hernia. She denies any pain or other symptoms associated with the hernia. Overall her functional status is good.. She denies any GI or GU symptoms and has no pelvic pain or pressure. HPI:  84 year old white female was initially seen in consultation at the request of Dr. Aloha Gell for evaluation of a newly diagnosed complex right pelvic mass.  The patient underwent a CT scan to assess an abdominal aortic aneurysm on 03/07/2014. An incidental finding was made of a 9 cm solid and cystic right adnexal mass. CT did not show any evidence of adenopathy, ascites, or any other evidence of metastatic disease. CA-125 value is 8.1 units per mL. The patient has no past gynecologic history except for undergoing a vaginal hysterectomy and previous tubal ligation. She denies any family history of ovarian cancer.  She does have significant medical comorbidities especially vascular. The aortic aneurysm measures 4.1 x 4.3 cm with extensive mural thrombus throughout the aneurysm.  Patient underwent exploratory laparotomy and bilateral salpingo-oophorectomy on 04/26/2014. 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 She had an uncomplicated   postoperative course.  The patient was followed with serial inhibin B and AMH.  Review of Systems:10 point review of systems is negative except as noted in interval history.   Vitals: Blood pressure 133/70, pulse (!) 58, temperature 97.7 F (36.5 C), temperature source Oral, resp. rate 18, weight 141 lb 9.6 oz (64.2 kg), SpO2 98 %.  Physical Exam: General : The patient is a healthy woman in no acute distress.  HEENT: normocephalic, extraoccular movements normal; neck is supple without thyromegally  Lynphnodes: Supraclavicular and inguinal nodes not enlarged  Abdomen: Soft, non-tender, no ascites, no organomegally, no masses, no hernias, midline incision is well-healed. There is an easily reducible ventral hernia the lower aspect of the midline incision. Pelvic:  EGBUS: Normal female  Vagina: Normal, no lesions  Urethra and Bladder: Normal, non-tender  Cervix: Surgically absent  Uterus: Surgically absent  Bi-manual examination: No masses  Rectal: normal sphincter tone, no masses, no blood  Lower extremities: No edema or varicosities. Normal range of motion      Allergies  Allergen Reactions  . Nabumetone Anaphylaxis and Other (See Comments)    Causes inflammation    Past Medical History:  Diagnosis Date  . Abdominal aortic aneurysm ([place])    followed by Dr. Sherren Mocha Early  . Adenomatous colon polyp 1998  . Arthritis   . Blind left eye   . CAD (coronary artery disease) 08/1995   50-55% second diag dz.; occluded OM  . Complication of anesthesia    "Blood pressure went down" with retinal surgery - no problems with previous surgeries  . Echocardiogram abnormal 03/2012   mild to mod  AS, valve area1.58 cm2, mild concentric LVH, grade I diastolic dusfunction  . Gout   . Heart murmur   . Hyperlipidemia   . Hypertension   . Myocardial infarction [facility])    mild/ age 72  . PAD (peripheral artery disease) (H[facility])    mild to mod Rt ICA stenosis followed by doppler-02/05/12  . Pelvic mass    . Stroke [facility]) 2001   no residual problems  . Tobacco abuse   . Ulcer     Past Surgical History:  Procedure Laterality Date  . ABDOMINAL HYSTERECTOMY    . APPENDECTOMY    . CARDIAC CATHETERIZATION  08/1995   50-55% second diag lesion, occluded OM  . CATARACT EXTRACTION W/ INTRAOCULAR LENS  IMPLANT, BILATERAL    . COLONOSCOPY    . EYE SURGERY     lense implant removed/ blind in left eye  . LAPAROTOMY N/A 04/26/2014   Procedure: EXPLORATORY LAPAROTOMY/RESECTION OF PELVIC MASS;  Surgeon: Alvino Chapel, MD;  Location: [facility];  Service: Gynecology;  Laterality: N/A;  . OOPHORECTOMY     and fallopian tube  . POLYPECTOMY    . RETINAL DETACHMENT SURGERY  2006  . TONSILLECTOMY AND ADENOIDECTOMY    . TUBAL LIGATION    . UPPER GASTROINTESTINAL ENDOSCOPY     treated for h-pylori    Current Outpatient Medications  Medication Sig Dispense Refill  . allopurinol (ZYLOPRIM) 100 MG tablet Take 1 tablet (100 mg total) by mouth daily. 90 tablet 3  . amLODipine (NORVASC) 10 MG tablet TAKE 1 TABLET BY MOUTH  DAILY 90 tablet 2  . aspirin EC 81 MG tablet Take 81 mg by mouth daily.    Marland Kitchen atorvastatin (LIPITOR) 20 MG tablet TAKE 1 TABLET BY MOUTH  EVERY DAY 90 tablet 3  . cholecalciferol (VITAMIN D) 1000 units tablet Take 1,000 Units by mouth daily.    . colchicine 0.6 MG tablet Take 1 tablet (0.6 mg total) by mouth 4 (four) times daily as needed. 360 tablet 2  . dorzolamide-timolol (COSOPT) 22.3-6.8 MG/ML ophthalmic solution Place 1 drop into the left eye daily. (Patient taking differently: Place 1 drop into the left eye daily. 2-0.5% sol 10.0  One drop Left eye daily) 10 mL 3  . furosemide (LASIX) 20 MG tablet TAKE 1-2 TABLETS BY MOUTH  DAILY AS NEEDED FOR EDEMA. 180 tablet 1  . hydrochlorothiazide (MICROZIDE) 12.5 MG capsule Take 1 capsule (12.5 mg total) by mouth daily. 90 capsule 3  . levothyroxine (SYNTHROID, LEVOTHROID) 50 MCG tablet Take 1 tablet (50 mcg total) by mouth daily. 90  tablet 3  . metoprolol tartrate (LOPRESSOR) 50 MG tablet TAKE ONE-HALF TABLET BY  MOUTH TWO TIMES DAILY 90 tablet 1  . Polyethylene Glycol 3350 POWD as needed.     Marland Kitchen telmisartan (MICARDIS) 40 MG tablet Take 1 tablet (40 mg total) by mouth daily. 90 tablet 3  . tiZANidine (ZANAFLEX) 4 MG tablet Take 1 tablet (4 mg total) by mouth every 8 (eight) hours as needed for muscle spasms. 90 tablet 2  . traMADol (ULTRAM) 50 MG tablet Take 1 tablet (50 mg total) by mouth every 8 (eight) hours as needed. 100 tablet 2   No current facility-administered medications for this visit.     Social History   Socioeconomic History  . Marital status: Widowed    Spouse name: Not on file  . Number of children: 5  . Years of education: 9  . Highest education level: Not on file  Social  Needs  . Financial resource strain: Not on file  . Food insecurity - worry: Not on file  . Food insecurity - inability: Not on file  . Transportation needs - medical: Not on file  . Transportation needs - non-medical: Not on file  Occupational History  . Occupation: Oncologist    Comment: retired  Tobacco Use  . Smoking status: Current Every Day Smoker    Packs/day: 1.00    Years: 62.00    Pack years: 62.00    Types: Cigarettes  . Smokeless tobacco: Never Used  Substance and Sexual Activity  . Alcohol use: Yes    Alcohol/week: 0.0 oz    Comment: occasionally - few drinks/month  . Drug use: No  . Sexual activity: No    Partners: Male    Birth control/protection: None, Surgical  Other Topics Concern  . Not on file  Social History Narrative   9th grade. married '54- widowed Dec 10th'08. 4 sons - '55, '58, '60, '69; 1 daughter - '63; 12 grandchildren; 9 great-grandchildren. 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 Other children are close and attentive. ACP/End-of-life: No CPR, no mechanical ventilation, no futile/heroic measures.    Family History  Problem Relation Age  of Onset  . Aneurysm Mother        AAA  . Hypertension Mother   . Hyperlipidemia Mother   . Heart disease Father        CAD, MI x 3  . Heart attack Father   . Cancer Maternal Aunt        breast cancer  . Diabetes Sister   . Hyperlipidemia Sister   . Hypertension Sister   . Colon cancer Neg Hx   . Stomach cancer Neg Hx       Marti Sleigh, MD 07/15/2017, 1:08 PM

## 2017-07-15 NOTE — Patient Instructions (Signed)
Follow up with Dr. Fermin Schwab in 6 months. Please call the office at [phone] in June to schedule the appointment for august.

## 2017-07-17 LAB — INHIBIN B: Inhibin B: <7 pg/mL (ref 0.0–16.9)

## 2017-07-19 LAB — ANTI MULLERIAN HORMONE

## 2017-07-21 ENCOUNTER — Telehealth: Payer: Self-pay | Admitting: Gynecologic Oncology

## 2017-07-21 DIAGNOSIS — Z9889 Other specified postprocedural states: Secondary | ICD-10-CM | POA: Diagnosis not present

## 2017-07-21 DIAGNOSIS — Z961 Presence of intraocular lens: Secondary | ICD-10-CM | POA: Diagnosis not present

## 2017-07-21 DIAGNOSIS — H2702 Aphakia, left eye: Secondary | ICD-10-CM | POA: Diagnosis not present

## 2017-07-21 DIAGNOSIS — H44512 Absolute glaucoma, left eye: Secondary | ICD-10-CM | POA: Diagnosis not present

## 2017-07-21 NOTE — Telephone Encounter (Signed)
Left message for patient with lab results.  Advised to call for any needs or concerns.

## 2017-08-06 DIAGNOSIS — J209 Acute bronchitis, unspecified: Secondary | ICD-10-CM | POA: Diagnosis not present

## 2017-08-21 ENCOUNTER — Ambulatory Visit (INDEPENDENT_AMBULATORY_CARE_PROVIDER_SITE_OTHER): Payer: Medicare Other | Admitting: Internal Medicine

## 2017-08-21 ENCOUNTER — Encounter: Payer: Self-pay | Admitting: Internal Medicine

## 2017-08-21 ENCOUNTER — Other Ambulatory Visit (INDEPENDENT_AMBULATORY_CARE_PROVIDER_SITE_OTHER): Payer: Medicare Other

## 2017-08-21 VITALS — BP 132/76 | HR 60 | Temp 97.7°F | Ht 63.0 in | Wt 142.0 lb

## 2017-08-21 DIAGNOSIS — Z23 Encounter for immunization: Secondary | ICD-10-CM

## 2017-08-21 DIAGNOSIS — I739 Peripheral vascular disease, unspecified: Secondary | ICD-10-CM | POA: Diagnosis not present

## 2017-08-21 DIAGNOSIS — F17200 Nicotine dependence, unspecified, uncomplicated: Secondary | ICD-10-CM | POA: Diagnosis not present

## 2017-08-21 DIAGNOSIS — I2510 Atherosclerotic heart disease of native coronary artery without angina pectoris: Secondary | ICD-10-CM

## 2017-08-21 DIAGNOSIS — I10 Essential (primary) hypertension: Secondary | ICD-10-CM

## 2017-08-21 DIAGNOSIS — E039 Hypothyroidism, unspecified: Secondary | ICD-10-CM

## 2017-08-21 LAB — CBC WITH DIFFERENTIAL/PLATELET
Basophils Absolute: 0.1 10*3/uL (ref 0.0–0.1)
Basophils Relative: 1 % (ref 0.0–3.0)
EOS PCT: 2.3 % (ref 0.0–5.0)
Eosinophils Absolute: 0.2 10*3/uL (ref 0.0–0.7)
HEMATOCRIT: 43.7 % (ref 36.0–46.0)
HEMOGLOBIN: 14.6 g/dL (ref 12.0–15.0)
LYMPHS PCT: 25.9 % (ref 12.0–46.0)
Lymphs Abs: 2.2 10*3/uL (ref 0.7–4.0)
MCHC: 33.5 g/dL (ref 30.0–36.0)
MCV: 89.3 fl (ref 78.0–100.0)
MONOS PCT: 6.7 % (ref 3.0–12.0)
Monocytes Absolute: 0.6 10*3/uL (ref 0.1–1.0)
Neutro Abs: 5.5 10*3/uL (ref 1.4–7.7)
Neutrophils Relative %: 64.1 % (ref 43.0–77.0)
Platelets: 273 10*3/uL (ref 150.0–400.0)
RBC: 4.89 Mil/uL (ref 3.87–5.11)
RDW: 13.8 % (ref 11.5–15.5)
WBC: 8.6 10*3/uL (ref 4.0–10.5)

## 2017-08-21 LAB — BASIC METABOLIC PANEL
BUN: 17 mg/dL (ref 6–23)
CALCIUM: 9.9 mg/dL (ref 8.4–10.5)
CO2: 27 meq/L (ref 19–32)
CREATININE: 1.02 mg/dL (ref 0.40–1.20)
Chloride: 106 mEq/L (ref 96–112)
GFR: 55.16 mL/min — ABNORMAL LOW (ref >60.00)
GLUCOSE: 119 mg/dL — AB (ref 70–99)
Potassium: 4.8 mEq/L (ref 3.5–5.1)
Sodium: 142 mEq/L (ref 135–145)

## 2017-08-21 LAB — HEPATIC FUNCTION PANEL
ALBUMIN: 4.2 g/dL (ref 3.5–5.2)
ALK PHOS: 59 U/L (ref 39–117)
ALT: 9 U/L (ref 0–35)
AST: 16 U/L (ref 0–37)
Bilirubin, Direct: 1.3 mg/dL — ABNORMAL HIGH (ref 0.0–0.3)
Total Bilirubin: 0.3 mg/dL (ref 0.2–1.2)
Total Protein: 7 g/dL (ref 6.0–8.3)

## 2017-08-21 LAB — TSH: TSH: 5.54 u[IU]/mL — AB (ref 0.35–4.50)

## 2017-08-21 LAB — T4, FREE: Free T4: 1.08 ng/dL (ref 0.60–1.60)

## 2017-08-21 MED ORDER — TELMISARTAN 40 MG PO TABS: 40.0000 mg | ORAL_TABLET | Freq: Every day | ORAL | 3 refills | Status: DC

## 2017-08-21 MED ORDER — METOPROLOL TARTRATE 50 MG PO TABS: ORAL_TABLET | ORAL | 3 refills | Status: DC

## 2017-08-21 MED ORDER — HYDROCHLOROTHIAZIDE 12.5 MG PO CAPS: 12.5000 mg | ORAL_CAPSULE | Freq: Every day | ORAL | 3 refills | Status: DC

## 2017-08-21 MED ORDER — ATORVASTATIN CALCIUM 20 MG PO TABS: 20.0000 mg | ORAL_TABLET | Freq: Every day | ORAL | 3 refills | Status: DC

## 2017-08-21 MED ORDER — AMLODIPINE BESYLATE 10 MG PO TABS: 10.0000 mg | ORAL_TABLET | Freq: Every day | ORAL | 3 refills | Status: DC

## 2017-08-21 MED ORDER — LEVOTHYROXINE SODIUM 50 MCG PO TABS: 50.0000 ug | ORAL_TABLET | Freq: Every day | ORAL | 3 refills | Status: DC

## 2017-08-21 MED ORDER — ALLOPURINOL 100 MG PO TABS: 100.0000 mg | ORAL_TABLET | Freq: Every day | ORAL | 3 refills | Status: DC

## 2017-08-21 NOTE — Assessment & Plan Note (Signed)
Micardis, amlodipine, ASA

## 2017-08-21 NOTE — Addendum Note (Signed)
Addended by: Karren Cobble on: 08/21/2017 10:47 AM   Modules accepted: Orders

## 2017-08-21 NOTE — Patient Instructions (Addendum)
MC well w/Jill 

## 2017-08-21 NOTE — Assessment & Plan Note (Signed)
Micardis

## 2017-08-21 NOTE — Assessment & Plan Note (Signed)
Discussed.

## 2017-08-21 NOTE — Progress Notes (Signed)
Subjective:  Patient ID: Tammy Brooks, female    DOB: [birth year]  Age: 84 y.o. MRN: [id number]  CC: No chief complaint on file.   HPI Tammy Brooks presents for HTN, hypothyroidism, dyslipidemia f/u  Outpatient Medications Prior to Visit  Medication Sig Dispense Refill  . allopurinol (ZYLOPRIM) 100 MG tablet Take 1 tablet (100 mg total) by mouth daily. 90 tablet 3  . amLODipine (NORVASC) 10 MG tablet TAKE 1 TABLET BY MOUTH  DAILY 90 tablet 2  . aspirin EC 81 MG tablet Take 81 mg by mouth daily.    Marland Kitchen atorvastatin (LIPITOR) 20 MG tablet TAKE 1 TABLET BY MOUTH  EVERY DAY 90 tablet 3  . cholecalciferol (VITAMIN D) 1000 units tablet Take 1,000 Units by mouth daily.    . colchicine 0.6 MG tablet Take 1 tablet (0.6 mg total) by mouth 4 (four) times daily as needed. 360 tablet 2  . dorzolamide-timolol (COSOPT) 22.3-6.8 MG/ML ophthalmic solution Place 1 drop into the left eye daily. (Patient taking differently: Place 1 drop into the left eye daily. 2-0.5% sol 10.0  One drop Left eye daily) 10 mL 3  . furosemide (LASIX) 20 MG tablet TAKE 1-2 TABLETS BY MOUTH  DAILY AS NEEDED FOR EDEMA. 180 tablet 1  . hydrochlorothiazide (MICROZIDE) 12.5 MG capsule Take 1 capsule (12.5 mg total) by mouth daily. 90 capsule 3  . levothyroxine (SYNTHROID, LEVOTHROID) 50 MCG tablet Take 1 tablet (50 mcg total) by mouth daily. 90 tablet 3  . metoprolol tartrate (LOPRESSOR) 50 MG tablet TAKE ONE-HALF TABLET BY  MOUTH TWO TIMES DAILY 90 tablet 1  . Polyethylene Glycol 3350 POWD as needed.     Marland Kitchen telmisartan (MICARDIS) 40 MG tablet Take 1 tablet (40 mg total) by mouth daily. 90 tablet 3  . tiZANidine (ZANAFLEX) 4 MG tablet Take 1 tablet (4 mg total) by mouth every 8 (eight) hours as needed for muscle spasms. 90 tablet 2  . traMADol (ULTRAM) 50 MG tablet Take 1 tablet (50 mg total) by mouth every 8 (eight) hours as needed. 100 tablet 2   No facility-administered medications prior to visit.     ROS Review of Systems    Constitutional: Negative for activity change, appetite change, chills, fatigue and unexpected weight change.  HENT: Negative for congestion, mouth sores and sinus pressure.   Eyes: Negative for visual disturbance.  Respiratory: Negative for cough and chest tightness.   Gastrointestinal: Negative for abdominal pain and nausea.  Genitourinary: Negative for difficulty urinating, frequency and vaginal pain.  Musculoskeletal: Positive for arthralgias. Negative for back pain and gait problem.  Skin: Negative for pallor and rash.  Neurological: Negative for dizziness, tremors, weakness, numbness and headaches.  Psychiatric/Behavioral: Negative for confusion and sleep disturbance.    Objective:  BP 132/76 (BP Location: Left Arm, Patient Position: Sitting, Cuff Size: Normal)   Pulse 60   Temp 97.7 F (36.5 C) (Oral)   Ht 5\' 3"  (1.6 m)   Wt 142 lb (64.4 kg)   SpO2 99%   BMI 25.15 kg/m   BP Readings from Last 3 Encounters:  08/21/17 132/76  07/15/17 133/70  07/03/17 118/61    Wt Readings from Last 3 Encounters:  08/21/17 142 lb (64.4 kg)  07/15/17 141 lb 9.6 oz (64.2 kg)  07/03/17 140 lb (63.5 kg)    Physical Exam  Constitutional: She appears well-developed. No distress.  HENT:  Head: Normocephalic.  Right Ear: External ear normal.  Left Ear: External ear normal.  Nose:  Nose normal.  Mouth/Throat: Oropharynx is clear and moist.  Eyes: Pupils are equal, round, and reactive to light. Conjunctivae are normal. Right eye exhibits no discharge. Left eye exhibits no discharge.  Neck: Normal range of motion. Neck supple. No JVD present. No tracheal deviation present. No thyromegaly present.  Cardiovascular: Normal rate, regular rhythm and normal heart sounds.  Pulmonary/Chest: No stridor. No respiratory distress. She has no wheezes.  Abdominal: Soft. Bowel sounds are normal. She exhibits no distension and no mass. There is no tenderness. There is no rebound and no guarding.   Musculoskeletal: She exhibits no edema or tenderness.  Lymphadenopathy:    She has no cervical adenopathy.  Neurological: She displays normal reflexes. No cranial nerve deficit. She exhibits normal muscle tone. Coordination normal.  Skin: No rash noted. No erythema.  Psychiatric: Her behavior is normal. Judgment and thought content normal.  AKs  Lab Results  Component Value Date   WBC 8.2 12/18/2016   HGB 14.4 12/18/2016   HCT 44.1 12/18/2016   PLT 286.0 12/18/2016   GLUCOSE 110 (H) 12/18/2016   CHOL 143 02/01/2016   TRIG 141.0 02/01/2016   HDL 31.60 (L) 02/01/2016   LDLDIRECT 76.7 11/17/2008   LDLCALC 83 02/01/2016   ALT 13 12/18/2016   AST 18 12/18/2016   NA 138 12/18/2016   K 4.5 12/18/2016   CL 105 12/18/2016   CREATININE 1.10 12/18/2016   BUN 19 12/18/2016   CO2 27 12/18/2016   TSH 7.63 (H) 12/18/2016    No results found.  Assessment & Plan:   There are no diagnoses linked to this encounter. I am having Tammy Brooks. [name] maintain her aspirin EC, colchicine, dorzolamide-timolol, Polyethylene Glycol 3350, furosemide, cholecalciferol, tiZANidine, hydrochlorothiazide, traMADol, levothyroxine, allopurinol, amLODipine, atorvastatin, telmisartan, and metoprolol tartrate.  No orders of the defined types were placed in this encounter.    Follow-up: No follow-ups on file.  Walker Kehr, MD

## 2017-08-21 NOTE — Assessment & Plan Note (Addendum)
Micardis, amlodipine, ASA Xarelto low dose discussed

## 2017-10-02 ENCOUNTER — Other Ambulatory Visit: Payer: Self-pay

## 2017-10-02 ENCOUNTER — Encounter (HOSPITAL_COMMUNITY): Payer: Self-pay

## 2017-10-02 ENCOUNTER — Telehealth: Payer: Self-pay | Admitting: Cardiovascular Disease

## 2017-10-02 ENCOUNTER — Emergency Department (HOSPITAL_COMMUNITY): Payer: Medicare Other

## 2017-10-02 ENCOUNTER — Emergency Department (HOSPITAL_COMMUNITY)
Admission: EM | Admit: 2017-10-02 | Discharge: 2017-10-02 | Disposition: A | Discharge status: Home / Self Care | Payer: Medicare Other | Attending: Emergency Medicine | Admitting: Emergency Medicine

## 2017-10-02 DIAGNOSIS — Z8673 Personal history of transient ischemic attack (TIA), and cerebral infarction without residual deficits: Secondary | ICD-10-CM | POA: Insufficient documentation

## 2017-10-02 DIAGNOSIS — F17210 Nicotine dependence, cigarettes, uncomplicated: Secondary | ICD-10-CM | POA: Diagnosis not present

## 2017-10-02 DIAGNOSIS — R0789 Other chest pain: Secondary | ICD-10-CM | POA: Insufficient documentation

## 2017-10-02 DIAGNOSIS — I252 Old myocardial infarction: Secondary | ICD-10-CM | POA: Insufficient documentation

## 2017-10-02 DIAGNOSIS — Z7982 Long term (current) use of aspirin: Secondary | ICD-10-CM | POA: Insufficient documentation

## 2017-10-02 DIAGNOSIS — I10 Essential (primary) hypertension: Secondary | ICD-10-CM | POA: Diagnosis not present

## 2017-10-02 DIAGNOSIS — I2510 Atherosclerotic heart disease of native coronary artery without angina pectoris: Secondary | ICD-10-CM | POA: Insufficient documentation

## 2017-10-02 DIAGNOSIS — R079 Chest pain, unspecified: Secondary | ICD-10-CM | POA: Diagnosis not present

## 2017-10-02 DIAGNOSIS — I700 Atherosclerosis of aorta: Secondary | ICD-10-CM | POA: Diagnosis not present

## 2017-10-02 LAB — BASIC METABOLIC PANEL
Anion gap: 8 (ref 5–15)
BUN: 17 mg/dL (ref 6–20)
CALCIUM: 9.6 mg/dL (ref 8.9–10.3)
CHLORIDE: 105 mmol/L (ref 101–111)
CO2: 27 mmol/L (ref 22–32)
CREATININE: 0.98 mg/dL (ref 0.44–1.00)
GFR calc Af Amer: >60 mL/min (ref >60)
GFR calc non Af Amer: 52 mL/min — ABNORMAL LOW (ref >60)
Glucose, Bld: 105 mg/dL — ABNORMAL HIGH (ref 65–99)
Potassium: 4.7 mmol/L (ref 3.5–5.1)
Sodium: 140 mmol/L (ref 135–145)

## 2017-10-02 LAB — CBC
HCT: 45.6 % (ref 36.0–46.0)
Hemoglobin: 14.7 g/dL (ref 12.0–15.0)
MCH: 29.3 pg (ref 26.0–34.0)
MCHC: 32.2 g/dL (ref 30.0–36.0)
MCV: 91 fL (ref 78.0–100.0)
PLATELETS: 225 10*3/uL (ref 150–400)
RBC: 5.01 MIL/uL (ref 3.87–5.11)
RDW: 14.3 % (ref 11.5–15.5)
WBC: 9.6 10*3/uL (ref 4.0–10.5)

## 2017-10-02 LAB — I-STAT TROPONIN, ED
TROPONIN I, POC: 0 ng/mL (ref 0.00–0.08)
TROPONIN I, POC: 0 ng/mL (ref 0.00–0.08)

## 2017-10-02 MED ORDER — IOPAMIDOL (ISOVUE-370) INJECTION 76%
100.0000 mL | Freq: Once | INTRAVENOUS | Status: AC | PRN
  Administered 2017-10-02: 100 mL via INTRAVENOUS

## 2017-10-02 MED ORDER — SODIUM CHLORIDE 0.9 % IV SOLN
INTRAVENOUS | Status: DC
  Administered 2017-10-02: 15:00:00 via INTRAVENOUS

## 2017-10-02 MED ORDER — IOPAMIDOL (ISOVUE-370) INJECTION 76%
INTRAVENOUS | Status: AC
  Filled 2017-10-02: qty 100

## 2017-10-02 NOTE — Telephone Encounter (Signed)
Patient is currently in the ER.

## 2017-10-02 NOTE — ED Provider Notes (Signed)
[facility] EMERGENCY DEPARTMENT Provider Note   CSN: [id number] Arrival date & time: 10/02/17  1020     History   Chief Complaint No chief complaint on file.   HPI Tammy Brooks is a 84 y.o. female.  Patient with a complaint of anterior chest pain radiating to the back that started at noon on Sunday.'s been constant ever since.  Right shoulder blade on the back also right anterior chest goes into her right arm.  Not associated with shortness of breath no diaphoresis no nausea or vomiting.  Patient states pain is not made worse by taking deep breath not made worse by moving her arm.  Patient has not had pain like this before.  Patient is followed by Dr. Alvester Chou from cardiology.  Patient is known to have coronary artery disease.  Also known to have peripheral vascular disease.  Patient has tramadol and muscle relaxers at home but did not try to take him for this pain.     Past Medical History:  Diagnosis Date  . Abdominal aortic aneurysm ([place])    followed by Dr. Sherren Mocha Early  . Adenomatous colon polyp 1998  . Arthritis   . Blind left eye   . CAD (coronary artery disease) 08/1995   50-55% second diag dz.; occluded OM  . Complication of anesthesia    "Blood pressure went down" with retinal surgery - no problems with previous surgeries  . Echocardiogram abnormal 03/2012   mild to mod AS, valve area1.58 cm2, mild concentric LVH, grade I diastolic dusfunction  . Gout   . Heart murmur   . Hyperlipidemia   . Hypertension   . Myocardial infarction [facility])    mild/ age 19  . PAD (peripheral artery disease) ([facility])    mild to mod Rt ICA stenosis followed by doppler-02/05/12  . Pelvic mass   . Stroke [facility]) 2001   no residual problems  . Tobacco abuse   . Ulcer     Patient Active Problem List   Diagnosis Date Noted  . Tobacco dependence 08/21/2017  . Laceration of hand 04/09/2016  . Well adult exam 01/30/2016  . Actinic keratoses 01/30/2016  . Edema 11/30/2014  .  Inguinal hernia 08/30/2014  . Low back pain 06/02/2014  . Bradycardia 06/02/2014  . Pelvic mass in female 04/26/2014  . Bilateral lower extremity edema 03/09/2013  . Hx of carotid artery stenosis, RT. -moderate, followed by Dr. Gwenlyn Found, Last dopplers 02/2012 03/09/2013  . Seasonal allergies 03/09/2013  . AAA (abdominal aortic aneurysm) without rupture ([place]) 03/04/2013  . Gout 03/04/2013  . Osteoarthritis 03/04/2013  . Need for prophylactic vaccination and inoculation against influenza 03/18/2012  . Aortic stenosis 03/15/2012  . Routine health maintenance 07/30/2011  . GLAUCOMA ASSOCIATED W/OTH ANT SEGMENT ANOMALIES 11/21/2009  . UNSPECIFIED URINARY INCONTINENCE 01/20/2008  . Dyslipidemia 10/08/2007  . Essential hypertension 10/08/2007  . Coronary atherosclerosis 10/08/2007  . Peripheral vascular disease ([place]) 10/08/2007  . CEREBROVASCULAR ACCIDENT, HX OF 10/08/2007  . COLONIC POLYPS, HX OF 10/08/2007  . TOBACCO ABUSE 10/07/2007    Past Surgical History:  Procedure Laterality Date  . ABDOMINAL HYSTERECTOMY    . APPENDECTOMY    . CARDIAC CATHETERIZATION  08/1995   50-55% second diag lesion, occluded OM  . CATARACT EXTRACTION W/ INTRAOCULAR LENS  IMPLANT, BILATERAL    . COLONOSCOPY    . EYE SURGERY     lense implant removed/ blind in left eye  . LAPAROTOMY N/A 04/26/2014   Procedure: EXPLORATORY LAPAROTOMY/RESECTION OF  PELVIC MASS;  Surgeon: Alvino Chapel, MD;  Location: [facility] ORS;  Service: Gynecology;  Laterality: N/A;  . OOPHORECTOMY     and fallopian tube  . POLYPECTOMY    . RETINAL DETACHMENT SURGERY  2006  . TONSILLECTOMY AND ADENOIDECTOMY    . TUBAL LIGATION    . UPPER GASTROINTESTINAL ENDOSCOPY     treated for h-pylori     OB History   None      Home Medications    Prior to Admission medications   Medication Sig Start Date End Date Taking? Authorizing Provider  allopurinol (ZYLOPRIM) 100 MG tablet Take 1 tablet (100 mg total) by mouth daily. 08/21/17   Yes Plotnikov, Evie Lacks, MD  amLODipine (NORVASC) 10 MG tablet Take 1 tablet (10 mg total) by mouth daily. 08/21/17  Yes Plotnikov, Evie Lacks, MD  aspirin EC 81 MG tablet Take 81 mg by mouth daily.   Yes [provider]  cholecalciferol (VITAMIN D) 1000 units tablet Take 1,000 Units by mouth daily.   Yes [provider]  colchicine 0.6 MG tablet Take 1 tablet (0.6 mg total) by mouth 4 (four) times daily as needed. 08/25/14  Yes Plotnikov, Evie Lacks, MD  dorzolamide-timolol (COSOPT) 22.3-6.8 MG/ML ophthalmic solution Place 1 drop into the left eye daily. Patient taking differently: Place 1 drop into the left eye daily. 2-0.5% sol 10.0  One drop Left eye daily 08/25/14  Yes Plotnikov, Evie Lacks, MD  furosemide (LASIX) 20 MG tablet TAKE 1-2 TABLETS BY MOUTH  DAILY AS NEEDED FOR EDEMA. 04/18/15  Yes Plotnikov, Evie Lacks, MD  levothyroxine (SYNTHROID, LEVOTHROID) 50 MCG tablet Take 1 tablet (50 mcg total) by mouth daily. 08/21/17  Yes Plotnikov, Evie Lacks, MD  metoprolol tartrate (LOPRESSOR) 50 MG tablet TAKE ONE-HALF TABLET BY  MOUTH TWO TIMES DAILY 08/21/17  Yes Plotnikov, Evie Lacks, MD  naproxen sodium (ALEVE) 220 MG tablet Take 440 mg by mouth as needed (back pain).   Yes [provider]  telmisartan (MICARDIS) 40 MG tablet Take 1 tablet (40 mg total) by mouth daily. 08/21/17  Yes Plotnikov, Evie Lacks, MD  tiZANidine (ZANAFLEX) 4 MG tablet Take 1 tablet (4 mg total) by mouth every 8 (eight) hours as needed for muscle spasms. 09/12/16  Yes Plotnikov, Evie Lacks, MD  traMADol (ULTRAM) 50 MG tablet Take 1 tablet (50 mg total) by mouth every 8 (eight) hours as needed. 12/18/16  Yes Plotnikov, Evie Lacks, MD  atorvastatin (LIPITOR) 20 MG tablet Take 1 tablet (20 mg total) by mouth daily. 08/21/17   Plotnikov, Evie Lacks, MD  hydrochlorothiazide (MICROZIDE) 12.5 MG capsule Take 1 capsule (12.5 mg total) by mouth daily. Patient not taking: Reported on 10/02/2017 08/21/17   Plotnikov, Evie Lacks,  MD    Family History Family History  Problem Relation Age of Onset  . Aneurysm Mother        AAA  . Hypertension Mother   . Hyperlipidemia Mother   . Heart disease Father        CAD, MI x 3  . Heart attack Father   . Cancer Maternal Aunt        breast cancer  . Diabetes Sister   . Hyperlipidemia Sister   . Hypertension Sister   . Colon cancer Neg Hx   . Stomach cancer Neg Hx     Social History Social History   Tobacco Use  . Smoking status: Current Every Day Smoker    Packs/day: 1.00    Years: 62.00  Pack years: 62.00    Types: Cigarettes  . Smokeless tobacco: Never Used  Substance Use Topics  . Alcohol use: Yes    Alcohol/week: 0.0 oz    Comment: occasionally - few drinks/month  . Drug use: No     Allergies   Nabumetone   Review of Systems Review of Systems  Constitutional: Negative for diaphoresis and fever.  HENT: Negative for congestion.   Eyes: Negative for visual disturbance.  Respiratory: Negative for shortness of breath.   Cardiovascular: Positive for chest pain.  Gastrointestinal: Negative for abdominal pain.  Genitourinary: Negative for dysuria.  Musculoskeletal: Positive for back pain.  Skin: Negative for rash.  Neurological: Negative for tremors and headaches.  Hematological: Does not bruise/bleed easily.  Psychiatric/Behavioral: Negative for confusion.     Physical Exam Updated Vital Signs BP (!) 168/67   Pulse (!) 58   Temp 98.3 F (36.8 C) (Oral)   Resp 16   SpO2 97%   Physical Exam  Constitutional: She is oriented to person, place, and time. She appears well-developed and well-nourished. No distress.  HENT:  Head: Normocephalic and atraumatic.  Mouth/Throat: Oropharynx is clear and moist.  Eyes: Pupils are equal, round, and reactive to light. EOM are normal.  Neck: Normal range of motion. Neck supple.  Cardiovascular: Normal rate, regular rhythm and normal heart sounds.  Pulmonary/Chest: Effort normal and breath sounds  normal. No respiratory distress.  Abdominal: Soft. Bowel sounds are normal. There is no tenderness.  Musculoskeletal: Normal range of motion. She exhibits no edema.  Some aching with range of motion of her right arm.  Some mild tenderness to palpation around the right scapula.  Neurological: She is alert and oriented to person, place, and time. No cranial nerve deficit or sensory deficit. She exhibits normal muscle tone. Coordination normal.  Skin: Skin is warm. No rash noted.  Nursing note and vitals reviewed.    ED Treatments / Results  Labs (all labs ordered are listed, but only abnormal results are displayed) Labs Reviewed  BASIC METABOLIC PANEL - Abnormal; Notable for the following components:      Result Value   Glucose, Bld 105 (*)    GFR calc non Af Amer 52 (*)    All other components within normal limits  CBC  I-STAT TROPONIN, ED  I-STAT TROPONIN, ED    EKG EKG Interpretation  Date/Time:  Thursday Oct 02 2017 10:28:56 EDT Ventricular Rate:  62 PR Interval:  166 QRS Duration: 82 QT Interval:  448 QTC Calculation: 454 R Axis:   21 Text Interpretation:  Normal sinus rhythm Anterior infarct , age undetermined Abnormal ECG Confirmed by Fredia Sorrow [phone]) on 10/02/2017 2:12:57 PM   Radiology Dg Chest 2 View  Result Date: 10/02/2017 CLINICAL DATA:  Chest pain EXAM: CHEST - 2 VIEW COMPARISON:  04/20/2014 FINDINGS: Heart size is normal. Aortic atherosclerosis. No pleural effusion or edema. No airspace opacities. Spondylosis noted throughout the thoracic spine. IMPRESSION: 1. No active cardiopulmonary abnormalities. 2.  Aortic Atherosclerosis (ICD10-I70.0). Electronically Signed   By: Kerby Moors M.D.   On: 10/02/2017 10:43   Ct Angio Chest Pe W/cm &/or Wo Cm  Result Date: 10/02/2017 CLINICAL DATA:  Chest pain, rule out pulmonary embolism EXAM: CT ANGIOGRAPHY CHEST WITH CONTRAST TECHNIQUE: Multidetector CT imaging of the chest was performed using the standard protocol  during bolus administration of intravenous contrast. Multiplanar CT image reconstructions and MIPs were obtained to evaluate the vascular anatomy. CONTRAST:  50 mL ISOVUE-370 IOPAMIDOL (ISOVUE-370) INJECTION 76%  COMPARISON:  Chest two-view 10/03/2015. CT abdomen pelvis 12/16/2016, CT angio chest 03/14/2012 FINDINGS: Cardiovascular: Negative for pulmonary embolism. Pulmonary arteries normal in caliber. Advanced coronary calcification. Heart size mildly enlarged. No pericardial effusion. Advanced atherosclerotic disease in the thoracic aorta without aneurysm. Mediastinum/Nodes: Negative for mass or adenopathy Lungs/Pleura: Mild apical emphysema. Lungs are clear without infiltrate effusion or mass. Upper Abdomen: Advanced atherosclerotic disease in the abdominal aorta which is heavily calcified and aneurysmal. This is incompletely evaluated but appears similar to the prior CT abdomen study. At that time, the maximal AP diameter of the abdominal aortic aneurysm was 4.6 cm. Musculoskeletal: Thoracic disc degeneration and spurring. Review of the MIP images confirms the above findings. IMPRESSION: Negative for pulmonary embolism. Advanced atherosclerotic disease in the coronary arteries and aorta. Aneurysmal dilatation of the abdominal aortic aneurysm, incompletely evaluated on the study Apical emphysema.  Lungs are clear. Aortic Atherosclerosis (ICD10-I70.0) and Emphysema (ICD10-J43.9). Electronically Signed   By: Franchot Gallo M.D.   On: 10/02/2017 16:42    Procedures Procedures (including critical care time)  Medications Ordered in ED Medications  0.9 %  sodium chloride infusion ( Intravenous New Bag/Given 10/02/17 1521)  iopamidol (ISOVUE-370) 76 % injection (has no administration in time range)  iopamidol (ISOVUE-370) 76 % injection 100 mL (100 mLs Intravenous Contrast Given 10/02/17 1611)     Initial Impression / Assessment and Plan / ED Course  I have reviewed the triage vital signs and the nursing  notes.  Pertinent labs & imaging results that were available during my care of the patient were reviewed by me and considered in my medical decision making (see chart for details).    Patient has had 2- troponins and based on the fact that she has had constant unchanged pain since Sunday this is not likely to represent unstable angina or acute coronary syndrome.  Would expect troponin to go up.  In addition pulmonary embolus ruled out.  Chest x-ray negative labs without significant abnormality.  Due to the duration of the chest pain feel patient is safe for discharge home close follow-up with her cardiologist Dr. Alvester Chou and also follow-up with her primary care doctor.  Encourage patient to at least take her tramadol at home to help with the discomfort.  But she states she does not like to take it but it will be her option to take if she so chooses.  Recommend follow-up next week with both her doctors patient given precautions to return for any new or worse symptoms.  All we know about is what occurred for the past 5 days of any significant change patient needs to get seen in the emergency department again.  Patient's CT does show significant atherosclerotic disease.  But not sure if it correlates to the pain that brought her in today based on her work-up it does not.   Final Clinical Impressions(s) / ED Diagnoses   Final diagnoses:  Chest wall pain    ED Discharge Orders    None       Fredia Sorrow, MD 10/02/17 [phone]

## 2017-10-02 NOTE — Discharge Instructions (Addendum)
Extensive work-up here today after 5 days of constant right-sided chest pain and right upper back pain without any acute findings.  May be chest wall in nature.  Make an appointment to follow-up with cardiology as well as your primary care doctor.  I would call tomorrow to set up the appointments to be seen next week.  If anything new or different starts it is important that you get seen.  Recommend that you give a trial of your tramadol particularly at night to help with the discomfort.

## 2017-10-02 NOTE — ED Notes (Signed)
Patient ambulatory to bathroom with steady gait at this time 

## 2017-10-02 NOTE — ED Triage Notes (Signed)
Patient complains of CP with radiation to right arm that she describes as constant since Sunday. States that the pain has gotten worse, No SOB. Alert and oriented, NAD

## 2017-10-02 NOTE — Telephone Encounter (Signed)
Pt called complaining of constant chest pain (all day and all night) since Sunday with radiating pain to back shoulder and arm on right side no other symptoms Pt has not checked B/P this am and does not recall reading from yesterday .Instructed pt to go to nearest ED for eval and tx.Pt will have daughter in law to transport to [facility] hospital . Will forward to [name] for review .Last myoview was in 2015 and was normal unchanged from previous study ./cy

## 2017-10-02 NOTE — ED Notes (Signed)
Patient verbalizes understanding of discharge instructions. Opportunity for questioning and answers were provided. Armband removed by staff, pt discharged from ED via wheelchair.  

## 2017-10-03 ENCOUNTER — Telehealth: Payer: Self-pay | Admitting: Cardiovascular Disease

## 2017-10-03 NOTE — Telephone Encounter (Signed)
New Message   Pt c/o of Chest Pain: STAT if CP now or developed within 24 hours  1. Are you having CP right now? yes  2. Are you experiencing any other symptoms (ex. SOB, nausea, vomiting, sweating)? Back, shoulder and arm pain  3. How long have you been experiencing CP? Since Sunday 5/28  4. Is your CP continuous or coming and going? continuous  5. Have you taken Nitroglycerin? no ?

## 2017-10-03 NOTE — Telephone Encounter (Signed)
Attempted to return call. Line buys x3 attempts. No answer

## 2017-10-06 NOTE — Telephone Encounter (Signed)
Follow Up:    Calling back from Friday.

## 2017-10-06 NOTE — Telephone Encounter (Signed)
Spoke with pt who reports she was experiencing right shoulder blade pain that radiated to her right arm and chest. She went to the emergency room and all test were negative. She states they feel it could be related to muscle spasm but would like for her to follow up with her cardiologist. Appointment made for 5/30 @ 130 pm with Kerin Ransom.

## 2017-10-07 ENCOUNTER — Encounter: Payer: Self-pay | Admitting: Internal Medicine

## 2017-10-07 ENCOUNTER — Ambulatory Visit (INDEPENDENT_AMBULATORY_CARE_PROVIDER_SITE_OTHER): Payer: Medicare Other | Admitting: Internal Medicine

## 2017-10-07 DIAGNOSIS — L570 Actinic keratosis: Secondary | ICD-10-CM | POA: Diagnosis not present

## 2017-10-07 DIAGNOSIS — R0789 Other chest pain: Secondary | ICD-10-CM | POA: Diagnosis not present

## 2017-10-07 DIAGNOSIS — R21 Rash and other nonspecific skin eruption: Secondary | ICD-10-CM | POA: Diagnosis not present

## 2017-10-07 DIAGNOSIS — I2510 Atherosclerotic heart disease of native coronary artery without angina pectoris: Secondary | ICD-10-CM

## 2017-10-07 MED ORDER — METHYLPREDNISOLONE ACETATE 80 MG/ML IJ SUSP
80.0000 mg | Freq: Once | INTRAMUSCULAR | Status: AC
  Administered 2017-10-07: 80 mg via INTRAMUSCULAR

## 2017-10-07 MED ORDER — PITAVASTATIN CALCIUM 2 MG PO TABS: 1.0000 | ORAL_TABLET | Freq: Every day | ORAL | 11 refills | Status: DC

## 2017-10-07 NOTE — Patient Instructions (Signed)
Call if rash on chest (shingles)

## 2017-10-07 NOTE — Assessment & Plan Note (Signed)
Derm appt in [place]

## 2017-10-07 NOTE — Progress Notes (Signed)
Subjective:  Patient ID: Tammy Brooks, female    DOB: 02/21/1934  Age: 84 y.o. MRN: [id number]  CC: No chief complaint on file.   HPI CARLENA RUYBAL presents for ER visit for CP (MI, PE was ruled out by labs/CT). Pt was d/c'd home. C/o a little CP in the front and back now C/o rash from ?lipitor  Outpatient Medications Prior to Visit  Medication Sig Dispense Refill  . allopurinol (ZYLOPRIM) 100 MG tablet Take 1 tablet (100 mg total) by mouth daily. 90 tablet 3  . amLODipine (NORVASC) 10 MG tablet Take 1 tablet (10 mg total) by mouth daily. 90 tablet 3  . aspirin EC 81 MG tablet Take 81 mg by mouth daily.    Marland Kitchen atorvastatin (LIPITOR) 20 MG tablet Take 1 tablet (20 mg total) by mouth daily. 90 tablet 3  . cholecalciferol (VITAMIN D) 1000 units tablet Take 1,000 Units by mouth daily.    . colchicine 0.6 MG tablet Take 1 tablet (0.6 mg total) by mouth 4 (four) times daily as needed. 360 tablet 2  . dorzolamide-timolol (COSOPT) 22.3-6.8 MG/ML ophthalmic solution Place 1 drop into the left eye daily. (Patient taking differently: Place 1 drop into the left eye daily. 2-0.5% sol 10.0  One drop Left eye daily) 10 mL 3  . furosemide (LASIX) 20 MG tablet TAKE 1-2 TABLETS BY MOUTH  DAILY AS NEEDED FOR EDEMA. 180 tablet 1  . hydrochlorothiazide (MICROZIDE) 12.5 MG capsule Take 1 capsule (12.5 mg total) by mouth daily. 90 capsule 3  . levothyroxine (SYNTHROID, LEVOTHROID) 50 MCG tablet Take 1 tablet (50 mcg total) by mouth daily. 90 tablet 3  . metoprolol tartrate (LOPRESSOR) 50 MG tablet TAKE ONE-HALF TABLET BY  MOUTH TWO TIMES DAILY 90 tablet 3  . naproxen sodium (ALEVE) 220 MG tablet Take 440 mg by mouth as needed (back pain).    Marland Kitchen telmisartan (MICARDIS) 40 MG tablet Take 1 tablet (40 mg total) by mouth daily. 90 tablet 3  . tiZANidine (ZANAFLEX) 4 MG tablet Take 1 tablet (4 mg total) by mouth every 8 (eight) hours as needed for muscle spasms. 90 tablet 2  . traMADol (ULTRAM) 50 MG tablet Take 1 tablet  (50 mg total) by mouth every 8 (eight) hours as needed. 100 tablet 2   No facility-administered medications prior to visit.     ROS Review of Systems  Constitutional: Negative for activity change, appetite change, chills, fatigue and unexpected weight change.  HENT: Negative for congestion, mouth sores and sinus pressure.   Eyes: Negative for visual disturbance.  Respiratory: Negative for cough, chest tightness and wheezing.   Cardiovascular: Positive for chest pain.  Gastrointestinal: Negative for abdominal pain and nausea.  Genitourinary: Negative for difficulty urinating, frequency and vaginal pain.  Musculoskeletal: Positive for back pain. Negative for gait problem.  Skin: Negative for pallor and rash.  Neurological: Negative for dizziness, tremors, weakness, numbness and headaches.  Psychiatric/Behavioral: Negative for confusion and sleep disturbance.    Objective:  BP 134/78 (BP Location: Left Arm, Patient Position: Sitting, Cuff Size: Normal)   Pulse (!) 52   Temp 97.6 F (36.4 C) (Oral)   Ht 5\' 3"  (1.6 m)   Wt 142 lb (64.4 kg)   SpO2 98%   BMI 25.15 kg/m   BP Readings from Last 3 Encounters:  10/07/17 134/78  10/02/17 (!) 168/67  08/21/17 132/76    Wt Readings from Last 3 Encounters:  10/07/17 142 lb (64.4 kg)  08/21/17 142  lb (64.4 kg)  07/15/17 141 lb 9.6 oz (64.2 kg)    Physical Exam  Constitutional: She appears well-developed. No distress.  HENT:  Head: Normocephalic.  Right Ear: External ear normal.  Left Ear: External ear normal.  Nose: Nose normal.  Mouth/Throat: Oropharynx is clear and moist.  Eyes: Pupils are equal, round, and reactive to light. Conjunctivae are normal. Right eye exhibits no discharge. Left eye exhibits no discharge.  Neck: Normal range of motion. Neck supple. No JVD present. No tracheal deviation present. No thyromegaly present.  Cardiovascular: Normal rate, regular rhythm and normal heart sounds.  Pulmonary/Chest: No stridor.  No respiratory distress. She has no wheezes.  Abdominal: Soft. Bowel sounds are normal. She exhibits no distension and no mass. There is no tenderness. There is no rebound and no guarding.  Musculoskeletal: She exhibits no edema or tenderness.  Lymphadenopathy:    She has no cervical adenopathy.  Neurological: She displays normal reflexes. No cranial nerve deficit. She exhibits normal muscle tone. Coordination normal.  Skin: No rash noted. No erythema.  Psychiatric: She has a normal mood and affect. Her behavior is normal. Judgment and thought content normal.  rash on legs Chest wall NT AKs  Lab Results  Component Value Date   WBC 9.6 10/02/2017   HGB 14.7 10/02/2017   HCT 45.6 10/02/2017   PLT 225 10/02/2017   GLUCOSE 105 (H) 10/02/2017   CHOL 143 02/01/2016   TRIG 141.0 02/01/2016   HDL 31.60 (L) 02/01/2016   LDLDIRECT 76.7 11/17/2008   LDLCALC 83 02/01/2016   ALT 9 08/21/2017   AST 16 08/21/2017   NA 140 10/02/2017   K 4.7 10/02/2017   CL 105 10/02/2017   CREATININE 0.98 10/02/2017   BUN 17 10/02/2017   CO2 27 10/02/2017   TSH 5.54 (H) 08/21/2017    Dg Chest 2 View  Result Date: 10/02/2017 CLINICAL DATA:  Chest pain EXAM: CHEST - 2 VIEW COMPARISON:  04/20/2014 FINDINGS: Heart size is normal. Aortic atherosclerosis. No pleural effusion or edema. No airspace opacities. Spondylosis noted throughout the thoracic spine. IMPRESSION: 1. No active cardiopulmonary abnormalities. 2.  Aortic Atherosclerosis (ICD10-I70.0). Electronically Signed   By: Kerby Moors M.D.   On: 10/02/2017 10:43   Ct Angio Chest Pe W/cm &/or Wo Cm  Result Date: 10/02/2017 CLINICAL DATA:  Chest pain, rule out pulmonary embolism EXAM: CT ANGIOGRAPHY CHEST WITH CONTRAST TECHNIQUE: Multidetector CT imaging of the chest was performed using the standard protocol during bolus administration of intravenous contrast. Multiplanar CT image reconstructions and MIPs were obtained to evaluate the vascular anatomy.  CONTRAST:  50 mL ISOVUE-370 IOPAMIDOL (ISOVUE-370) INJECTION 76% COMPARISON:  Chest two-view 10/03/2015. CT abdomen pelvis 12/16/2016, CT angio chest 03/14/2012 FINDINGS: Cardiovascular: Negative for pulmonary embolism. Pulmonary arteries normal in caliber. Advanced coronary calcification. Heart size mildly enlarged. No pericardial effusion. Advanced atherosclerotic disease in the thoracic aorta without aneurysm. Mediastinum/Nodes: Negative for mass or adenopathy Lungs/Pleura: Mild apical emphysema. Lungs are clear without infiltrate effusion or mass. Upper Abdomen: Advanced atherosclerotic disease in the abdominal aorta which is heavily calcified and aneurysmal. This is incompletely evaluated but appears similar to the prior CT abdomen study. At that time, the maximal AP diameter of the abdominal aortic aneurysm was 4.6 cm. Musculoskeletal: Thoracic disc degeneration and spurring. Review of the MIP images confirms the above findings. IMPRESSION: Negative for pulmonary embolism. Advanced atherosclerotic disease in the coronary arteries and aorta. Aneurysmal dilatation of the abdominal aortic aneurysm, incompletely evaluated on the study Apical emphysema.  Lungs are clear. Aortic Atherosclerosis (ICD10-I70.0) and Emphysema (ICD10-J43.9). Electronically Signed   By: Franchot Gallo M.D.   On: 10/02/2017 16:42    Assessment & Plan:   There are no diagnoses linked to this encounter. I am having Silvano Rusk. Gadway maintain her aspirin EC, colchicine, dorzolamide-timolol, furosemide, cholecalciferol, tiZANidine, traMADol, allopurinol, amLODipine, atorvastatin, hydrochlorothiazide, levothyroxine, metoprolol tartrate, telmisartan, and naproxen sodium.  No orders of the defined types were placed in this encounter.    Follow-up: No follow-ups on file.  Walker Kehr, MD

## 2017-10-07 NOTE — Assessment & Plan Note (Addendum)
C/o rash from ?lipitor. Will switch to Livalo F/u w/[name]

## 2017-10-07 NOTE — Assessment & Plan Note (Signed)
Depo-medrol F/u w/[name] Call if rash on chest (shingles)

## 2017-10-07 NOTE — Assessment & Plan Note (Signed)
C/o rash from ?lipitor. Will switch to Livalo

## 2017-10-07 NOTE — Addendum Note (Signed)
Addended by: Karren Cobble on: 10/07/2017 11:50 AM   Modules accepted: Orders

## 2017-10-14 DIAGNOSIS — R233 Spontaneous ecchymoses: Secondary | ICD-10-CM | POA: Diagnosis not present

## 2017-10-14 DIAGNOSIS — L578 Other skin changes due to chronic exposure to nonionizing radiation: Secondary | ICD-10-CM | POA: Diagnosis not present

## 2017-10-14 DIAGNOSIS — C44722 Squamous cell carcinoma of skin of right lower limb, including hip: Secondary | ICD-10-CM | POA: Diagnosis not present

## 2017-10-14 DIAGNOSIS — L814 Other melanin hyperpigmentation: Secondary | ICD-10-CM | POA: Diagnosis not present

## 2017-10-15 ENCOUNTER — Encounter (HOSPITAL_COMMUNITY): Payer: Self-pay | Admitting: Emergency Medicine

## 2017-10-15 ENCOUNTER — Emergency Department (HOSPITAL_COMMUNITY)
Admission: EM | Admit: 2017-10-15 | Discharge: 2017-10-15 | Disposition: A | Discharge status: Home / Self Care | Payer: Medicare Other | Attending: Emergency Medicine | Admitting: Emergency Medicine

## 2017-10-15 DIAGNOSIS — I2510 Atherosclerotic heart disease of native coronary artery without angina pectoris: Secondary | ICD-10-CM | POA: Insufficient documentation

## 2017-10-15 DIAGNOSIS — Z79899 Other long term (current) drug therapy: Secondary | ICD-10-CM | POA: Insufficient documentation

## 2017-10-15 DIAGNOSIS — I10 Essential (primary) hypertension: Secondary | ICD-10-CM | POA: Diagnosis not present

## 2017-10-15 DIAGNOSIS — E162 Hypoglycemia, unspecified: Secondary | ICD-10-CM | POA: Diagnosis not present

## 2017-10-15 DIAGNOSIS — R531 Weakness: Secondary | ICD-10-CM | POA: Diagnosis not present

## 2017-10-15 DIAGNOSIS — I252 Old myocardial infarction: Secondary | ICD-10-CM | POA: Insufficient documentation

## 2017-10-15 DIAGNOSIS — F17210 Nicotine dependence, cigarettes, uncomplicated: Secondary | ICD-10-CM | POA: Insufficient documentation

## 2017-10-15 DIAGNOSIS — M6281 Muscle weakness (generalized): Secondary | ICD-10-CM | POA: Insufficient documentation

## 2017-10-15 DIAGNOSIS — Z7982 Long term (current) use of aspirin: Secondary | ICD-10-CM | POA: Diagnosis not present

## 2017-10-15 DIAGNOSIS — Z8673 Personal history of transient ischemic attack (TIA), and cerebral infarction without residual deficits: Secondary | ICD-10-CM | POA: Insufficient documentation

## 2017-10-15 DIAGNOSIS — R5383 Other fatigue: Secondary | ICD-10-CM | POA: Diagnosis not present

## 2017-10-15 LAB — COMPREHENSIVE METABOLIC PANEL
ALBUMIN: 3.9 g/dL (ref 3.5–5.0)
ALK PHOS: 58 U/L (ref 38–126)
ALT: 13 U/L — AB (ref 14–54)
AST: 21 U/L (ref 15–41)
Anion gap: 8 (ref 5–15)
BILIRUBIN TOTAL: 0.5 mg/dL (ref 0.3–1.2)
BUN: 19 mg/dL (ref 6–20)
CALCIUM: 9.6 mg/dL (ref 8.9–10.3)
CO2: 26 mmol/L (ref 22–32)
CREATININE: 0.98 mg/dL (ref 0.44–1.00)
Chloride: 108 mmol/L (ref 101–111)
GFR calc Af Amer: >60 mL/min (ref >60)
GFR calc non Af Amer: 52 mL/min — ABNORMAL LOW (ref >60)
GLUCOSE: 78 mg/dL (ref 65–99)
Potassium: 4 mmol/L (ref 3.5–5.1)
SODIUM: 142 mmol/L (ref 135–145)
Total Protein: 7.3 g/dL (ref 6.5–8.1)

## 2017-10-15 LAB — CBC WITH DIFFERENTIAL/PLATELET
Basophils Absolute: 0 10*3/uL (ref 0.0–0.1)
Basophils Relative: 1 %
EOS PCT: 2 %
Eosinophils Absolute: 0.2 10*3/uL (ref 0.0–0.7)
HCT: 45.5 % (ref 36.0–46.0)
Hemoglobin: 14.9 g/dL (ref 12.0–15.0)
Lymphocytes Relative: 26 %
Lymphs Abs: 2.2 10*3/uL (ref 0.7–4.0)
MCH: 29.9 pg (ref 26.0–34.0)
MCHC: 32.7 g/dL (ref 30.0–36.0)
MCV: 91.4 fL (ref 78.0–100.0)
MONO ABS: 0.7 10*3/uL (ref 0.1–1.0)
MONOS PCT: 8 %
Neutro Abs: 5.6 10*3/uL (ref 1.7–7.7)
Neutrophils Relative %: 63 %
PLATELETS: 235 10*3/uL (ref 150–400)
RBC: 4.98 MIL/uL (ref 3.87–5.11)
RDW: 14.3 % (ref 11.5–15.5)
WBC: 8.8 10*3/uL (ref 4.0–10.5)

## 2017-10-15 LAB — URINALYSIS, ROUTINE W REFLEX MICROSCOPIC
BILIRUBIN URINE: NEGATIVE
Glucose, UA: NEGATIVE mg/dL
HGB URINE DIPSTICK: NEGATIVE
Ketones, ur: NEGATIVE mg/dL
Leukocytes, UA: NEGATIVE
Nitrite: NEGATIVE
PROTEIN: NEGATIVE mg/dL
SPECIFIC GRAVITY, URINE: 1.018 (ref 1.005–1.030)
pH: 5 (ref 5.0–8.0)

## 2017-10-15 LAB — I-STAT TROPONIN, ED: TROPONIN I, POC: 0.02 ng/mL (ref 0.00–0.08)

## 2017-10-15 LAB — CBG MONITORING, ED
Glucose-Capillary: 183 mg/dL — ABNORMAL HIGH (ref 65–99)
Glucose-Capillary: 126 mg/dL — ABNORMAL HIGH (ref 65–99)

## 2017-10-15 NOTE — ED Provider Notes (Signed)
[facility] DEPT Provider Note   CSN: [id number] Arrival date & time: 10/15/17  1611     History   Chief Complaint Chief Complaint  Patient presents with  . Fatigue  . Hypoglycemia    HPI Tammy Brooks is a 84 y.o. female.  84 year old female with past medical history including AAA, CAD, stroke who presents with weakness.  Around 1 PM today, she was sitting down working on dinner when she suddenly felt weak all over involving both arms and both legs.  She initially felt like she could not get up to walk but was able to get up and move around.  She then continued doing things at home and later her generalized weakness started back again.  She felt like she was going to fall but she denies any fall or syncope.  They called EMS and she was noted to initially have blood sugar of 69.  She notes that she has not eaten anything all day, just had a cup of coffee.  She was given oral glucose in route.  She reports that she is currently feeling better.  She denies any focal numbness, unilateral weakness, confusion, headache, vision changes, chest pain, shortness of breath, nausea/vomiting, fevers, or recent illness.  No urinary symptoms.  The history is provided by the patient.  Hypoglycemia    Past Medical History:  Diagnosis Date  . Abdominal aortic aneurysm ([place])    followed by Dr. Sherren Mocha Early  . Adenomatous colon polyp 1998  . Arthritis   . Blind left eye   . CAD (coronary artery disease) 08/1995   50-55% second diag dz.; occluded OM  . Complication of anesthesia    "Blood pressure went down" with retinal surgery - no problems with previous surgeries  . Echocardiogram abnormal 03/2012   mild to mod AS, valve area1.58 cm2, mild concentric LVH, grade I diastolic dusfunction  . Gout   . Heart murmur   . Hyperlipidemia   . Hypertension   . Myocardial infarction [facility])    mild/ age 20  . PAD (peripheral artery disease) ([facility])    mild to mod Rt ICA stenosis  followed by doppler-02/05/12  . Pelvic mass   . Stroke [facility]) 2001   no residual problems  . Tobacco abuse   . Ulcer     Patient Active Problem List   Diagnosis Date Noted  . Rash and nonspecific skin eruption 10/07/2017  . Tobacco dependence 08/21/2017  . Laceration of hand 04/09/2016  . Well adult exam 01/30/2016  . Actinic keratoses 01/30/2016  . Edema 11/30/2014  . Inguinal hernia 08/30/2014  . Low back pain 06/02/2014  . Bradycardia 06/02/2014  . Pelvic mass in female 04/26/2014  . Bilateral lower extremity edema 03/09/2013  . Hx of carotid artery stenosis, RT. -moderate, followed by Dr. Gwenlyn Found, Last dopplers 02/2012 03/09/2013  . Seasonal allergies 03/09/2013  . AAA (abdominal aortic aneurysm) without rupture ([place]) 03/04/2013  . Gout 03/04/2013  . Osteoarthritis 03/04/2013  . Need for prophylactic vaccination and inoculation against influenza 03/18/2012  . Aortic stenosis 03/15/2012  . Chest pain, atypical 03/14/2012  . Routine health maintenance 07/30/2011  . GLAUCOMA ASSOCIATED W/OTH ANT SEGMENT ANOMALIES 11/21/2009  . UNSPECIFIED URINARY INCONTINENCE 01/20/2008  . Dyslipidemia 10/08/2007  . Essential hypertension 10/08/2007  . Coronary atherosclerosis 10/08/2007  . Peripheral vascular disease ([place]) 10/08/2007  . CEREBROVASCULAR ACCIDENT, HX OF 10/08/2007  . COLONIC POLYPS, HX OF 10/08/2007  . TOBACCO ABUSE 10/07/2007    Past Surgical  History:  Procedure Laterality Date  . ABDOMINAL HYSTERECTOMY    . APPENDECTOMY    . CARDIAC CATHETERIZATION  08/1995   50-55% second diag lesion, occluded OM  . CATARACT EXTRACTION W/ INTRAOCULAR LENS  IMPLANT, BILATERAL    . COLONOSCOPY    . EYE SURGERY     lense implant removed/ blind in left eye  . LAPAROTOMY N/A 04/26/2014   Procedure: EXPLORATORY LAPAROTOMY/RESECTION OF PELVIC MASS;  Surgeon: Alvino Chapel, MD;  Location: [facility] ORS;  Service: Gynecology;  Laterality: N/A;  . OOPHORECTOMY     and fallopian tube  .  POLYPECTOMY    . RETINAL DETACHMENT SURGERY  2006  . TONSILLECTOMY AND ADENOIDECTOMY    . TUBAL LIGATION    . UPPER GASTROINTESTINAL ENDOSCOPY     treated for h-pylori     OB History   None      Home Medications    Prior to Admission medications   Medication Sig Start Date End Date Taking? Authorizing Provider  allopurinol (ZYLOPRIM) 100 MG tablet Take 1 tablet (100 mg total) by mouth daily. 08/21/17  Yes Plotnikov, Evie Lacks, MD  amLODipine (NORVASC) 10 MG tablet Take 1 tablet (10 mg total) by mouth daily. 08/21/17  Yes Plotnikov, Evie Lacks, MD  aspirin EC 81 MG tablet Take 81 mg by mouth daily.   Yes [provider]  atorvastatin (LIPITOR) 20 MG tablet Take 20 mg by mouth daily.   Yes [provider]  cholecalciferol (VITAMIN D) 1000 units tablet Take 1,000 Units by mouth daily.   Yes [provider]  dorzolamide-timolol (COSOPT) 22.3-6.8 MG/ML ophthalmic solution Place 1 drop into the left eye daily. Patient taking differently: Place 1 drop into the left eye daily. 2-0.5% sol 10.0  One drop Left eye daily 08/25/14  Yes Plotnikov, Evie Lacks, MD  levothyroxine (SYNTHROID, LEVOTHROID) 50 MCG tablet Take 1 tablet (50 mcg total) by mouth daily. 08/21/17  Yes Plotnikov, Evie Lacks, MD  metoprolol tartrate (LOPRESSOR) 50 MG tablet TAKE ONE-HALF TABLET BY  MOUTH TWO TIMES DAILY 08/21/17  Yes Plotnikov, Evie Lacks, MD  naproxen sodium (ALEVE) 220 MG tablet Take 440 mg by mouth as needed (back pain).   Yes [provider]  OVER THE COUNTER MEDICATION Take 1 capsule by mouth daily. Eye vitamin   Yes [provider]  telmisartan (MICARDIS) 40 MG tablet Take 1 tablet (40 mg total) by mouth daily. 08/21/17  Yes Plotnikov, Evie Lacks, MD  tiZANidine (ZANAFLEX) 4 MG tablet Take 1 tablet (4 mg total) by mouth every 8 (eight) hours as needed for muscle spasms. 09/12/16  Yes Plotnikov, Evie Lacks, MD  traMADol (ULTRAM) 50 MG tablet Take 1 tablet (50 mg total) by mouth  every 8 (eight) hours as needed. 12/18/16  Yes Plotnikov, Evie Lacks, MD  colchicine 0.6 MG tablet Take 1 tablet (0.6 mg total) by mouth 4 (four) times daily as needed. Patient not taking: Reported on 10/15/2017 08/25/14   Plotnikov, Evie Lacks, MD  furosemide (LASIX) 20 MG tablet TAKE 1-2 TABLETS BY MOUTH  DAILY AS NEEDED FOR EDEMA. Patient not taking: Reported on 10/15/2017 04/18/15   Plotnikov, Evie Lacks, MD  hydrochlorothiazide (MICROZIDE) 12.5 MG capsule Take 1 capsule (12.5 mg total) by mouth daily. Patient not taking: Reported on 10/15/2017 08/21/17   Plotnikov, Evie Lacks, MD  Pitavastatin Calcium 2 MG TABS Take 1 tablet (2 mg total) by mouth daily. Patient not taking: Reported on 10/15/2017 10/07/17   Plotnikov, Evie Lacks, MD  Family History Family History  Problem Relation Age of Onset  . Aneurysm Mother        AAA  . Hypertension Mother   . Hyperlipidemia Mother   . Heart disease Father        CAD, MI x 3  . Heart attack Father   . Cancer Maternal Aunt        breast cancer  . Diabetes Sister   . Hyperlipidemia Sister   . Hypertension Sister   . Colon cancer Neg Hx   . Stomach cancer Neg Hx     Social History Social History   Tobacco Use  . Smoking status: Current Every Day Smoker    Packs/day: 1.00    Years: 62.00    Pack years: 62.00    Types: Cigarettes  . Smokeless tobacco: Never Used  Substance Use Topics  . Alcohol use: Yes    Alcohol/week: 0.0 oz    Comment: occasionally - few drinks/month  . Drug use: No     Allergies   Nabumetone   Review of Systems Review of Systems All other systems reviewed and are negative except that which was mentioned in HPI   Physical Exam Updated Vital Signs BP (!) 119/51   Pulse 71   Temp 98.3 F (36.8 C) (Oral)   Resp 18   SpO2 94%   Physical Exam  Constitutional: She is oriented to person, place, and time. She appears well-developed and well-nourished. No distress.  Awake, alert  HENT:  Head: Normocephalic and  atraumatic.  Eyes: Pupils are equal, round, and reactive to light. Conjunctivae and EOM are normal.  Neck: Neck supple.  Cardiovascular: Normal rate and regular rhythm.  Murmur heard. Pulmonary/Chest: Effort normal and breath sounds normal. No respiratory distress.  Abdominal: Soft. Bowel sounds are normal. She exhibits no distension. There is no tenderness.  Musculoskeletal: She exhibits no edema.  Neurological: She is alert and oriented to person, place, and time. She has normal reflexes. No cranial nerve deficit. She exhibits normal muscle tone.  Fluent speech, normal finger-to-nose testing, negative pronator drift, no clonus 5/5 strength and normal sensation x all 4 extremities  Skin: Skin is warm and dry.  Psychiatric: She has a normal mood and affect. Judgment and thought content normal.  Nursing note and vitals reviewed.    ED Treatments / Results  Labs (all labs ordered are listed, but only abnormal results are displayed) Labs Reviewed  COMPREHENSIVE METABOLIC PANEL - Abnormal; Notable for the following components:      Result Value   ALT 13 (*)    GFR calc non Af Amer 52 (*)    All other components within normal limits  CBG MONITORING, ED - Abnormal; Notable for the following components:   Glucose-Capillary 183 (*)    All other components within normal limits  CBG MONITORING, ED - Abnormal; Notable for the following components:   Glucose-Capillary 126 (*)    All other components within normal limits  CBC WITH DIFFERENTIAL/PLATELET  URINALYSIS, ROUTINE W REFLEX MICROSCOPIC  I-STAT TROPONIN, ED    EKG EKG Interpretation  Date/Time:  Wednesday Oct 15 2017 18:50:56 EDT Ventricular Rate:  62 PR Interval:    QRS Duration: 96 QT Interval:  448 QTC Calculation: 455 R Axis:   -66 Text Interpretation:  Sinus rhythm LAD, consider left anterior fascicular block Anterior infarct, old No significant change since last tracing Confirmed by Theotis Burrow [phone]) on 10/15/2017  7:08:36 PM   Radiology No results found.  Procedures  Procedures (including critical care time)  Medications Ordered in ED Medications - No data to display   Initial Impression / Assessment and Plan / ED Course  I have reviewed the triage vital signs and the nursing notes.  Pertinent labs & imaging results that were available during my care of the patient were reviewed by me and considered in my medical decision making (see chart for details).    Pt well-appearing and comfortable on exam with reassuring vital signs.  She had a normal neurologic exam.  Given that weakness was bilateral involving all 4 extremities, I doubt stroke or acute intracranial process.  Screening lab work shows no evidence of infection, dehydration, electrolyte derangement, or anemia.  I do suspect that not eating all day may have contributed to her symptoms.  She has had food here in the ED and has remained asymptomatic with stable blood glucose.  She is not on any hypoglycemia- inducing medications.  I have discussed supportive measures and extensively reviewed return precautions.  Final Clinical Impressions(s) / ED Diagnoses   Final diagnoses:  Hypoglycemia  Generalized weakness    ED Discharge Orders    None       Little, Wenda Overland, MD 10/15/17 2312

## 2017-10-15 NOTE — ED Triage Notes (Addendum)
Per [facility] pt from home for weakness in arms and legs since about 1om today after she had set down after started fixing dinner. Hx stroke. Pt denies eating anything today, reports only having a cup coffee in early AM. CBG 69 and not eaten anything. Given oral glucose in route. 192/90 initial on scene, very anxious, now 124/58. Pt told EMS that she is now feeling little better.

## 2017-10-21 ENCOUNTER — Ambulatory Visit (INDEPENDENT_AMBULATORY_CARE_PROVIDER_SITE_OTHER): Payer: Medicare Other | Admitting: Internal Medicine

## 2017-10-21 ENCOUNTER — Encounter: Payer: Self-pay | Admitting: Internal Medicine

## 2017-10-21 DIAGNOSIS — R609 Edema, unspecified: Secondary | ICD-10-CM

## 2017-10-21 DIAGNOSIS — R21 Rash and other nonspecific skin eruption: Secondary | ICD-10-CM

## 2017-10-21 DIAGNOSIS — R29898 Other symptoms and signs involving the musculoskeletal system: Secondary | ICD-10-CM

## 2017-10-21 DIAGNOSIS — E785 Hyperlipidemia, unspecified: Secondary | ICD-10-CM

## 2017-10-21 DIAGNOSIS — F17200 Nicotine dependence, unspecified, uncomplicated: Secondary | ICD-10-CM

## 2017-10-21 MED ORDER — CLOPIDOGREL BISULFATE 75 MG PO TABS: 75.0000 mg | ORAL_TABLET | Freq: Every day | ORAL | 3 refills | Status: DC

## 2017-10-21 NOTE — Progress Notes (Signed)
Subjective:  Patient ID: Tammy Brooks, female    DOB: Aug 25, 1933  Age: 84 y.o. MRN: [id number]  CC: No chief complaint on file.   HPI Tammy Brooks presents for ER visit on 5/15 for a profound weakness in legs and arms - it lasted for 2-3 hrs. Apparently her CBG was low at 69 at home (checked by EMS). No LOC Smoker 1 PPD  Outpatient Medications Prior to Visit  Medication Sig Dispense Refill  . allopurinol (ZYLOPRIM) 100 MG tablet Take 1 tablet (100 mg total) by mouth daily. 90 tablet 3  . amLODipine (NORVASC) 10 MG tablet Take 1 tablet (10 mg total) by mouth daily. 90 tablet 3  . aspirin EC 81 MG tablet Take 81 mg by mouth daily.    Marland Kitchen atorvastatin (LIPITOR) 20 MG tablet Take 20 mg by mouth daily.    . cholecalciferol (VITAMIN D) 1000 units tablet Take 1,000 Units by mouth daily.    . colchicine 0.6 MG tablet Take 1 tablet (0.6 mg total) by mouth 4 (four) times daily as needed. 360 tablet 2  . dorzolamide-timolol (COSOPT) 22.3-6.8 MG/ML ophthalmic solution Place 1 drop into the left eye daily. (Patient taking differently: Place 1 drop into the left eye daily. 2-0.5% sol 10.0  One drop Left eye daily) 10 mL 3  . furosemide (LASIX) 20 MG tablet TAKE 1-2 TABLETS BY MOUTH  DAILY AS NEEDED FOR EDEMA. 180 tablet 1  . hydrochlorothiazide (MICROZIDE) 12.5 MG capsule Take 1 capsule (12.5 mg total) by mouth daily. 90 capsule 3  . levothyroxine (SYNTHROID, LEVOTHROID) 50 MCG tablet Take 1 tablet (50 mcg total) by mouth daily. 90 tablet 3  . metoprolol tartrate (LOPRESSOR) 50 MG tablet TAKE ONE-HALF TABLET BY  MOUTH TWO TIMES DAILY 90 tablet 3  . naproxen sodium (ALEVE) 220 MG tablet Take 440 mg by mouth as needed (back pain).    Marland Kitchen OVER THE COUNTER MEDICATION Take 1 capsule by mouth daily. Eye vitamin    . Pitavastatin Calcium 2 MG TABS Take 1 tablet (2 mg total) by mouth daily. 30 tablet 11  . telmisartan (MICARDIS) 40 MG tablet Take 1 tablet (40 mg total) by mouth daily. 90 tablet 3  . tiZANidine  (ZANAFLEX) 4 MG tablet Take 1 tablet (4 mg total) by mouth every 8 (eight) hours as needed for muscle spasms. 90 tablet 2  . traMADol (ULTRAM) 50 MG tablet Take 1 tablet (50 mg total) by mouth every 8 (eight) hours as needed. 100 tablet 2   No facility-administered medications prior to visit.     ROS Review of Systems  Constitutional: Positive for fatigue. Negative for activity change, appetite change, chills and unexpected weight change.  HENT: Negative for congestion, mouth sores and sinus pressure.   Eyes: Negative for visual disturbance.  Respiratory: Negative for cough and chest tightness.   Gastrointestinal: Negative for abdominal pain and nausea.  Genitourinary: Negative for difficulty urinating, frequency and vaginal pain.  Musculoskeletal: Negative for back pain and gait problem.  Skin: Negative for pallor and rash.  Neurological: Negative for dizziness, tremors, weakness, numbness and headaches.  Psychiatric/Behavioral: Negative for confusion, sleep disturbance and suicidal ideas.    Objective:  BP 136/82 (BP Location: Left Arm, Patient Position: Sitting, Cuff Size: Normal)   Pulse (!) 52   Temp 97.6 F (36.4 C) (Oral)   Ht 5\' 3"  (1.6 m)   Wt 141 lb (64 kg)   SpO2 99%   BMI 24.98 kg/m   BP  Readings from Last 3 Encounters:  10/21/17 136/82  10/15/17 (!) 146/73  10/07/17 134/78    Wt Readings from Last 3 Encounters:  10/21/17 141 lb (64 kg)  10/07/17 142 lb (64.4 kg)  08/21/17 142 lb (64.4 kg)    Physical Exam  Constitutional: She appears well-developed. No distress.  HENT:  Head: Normocephalic.  Right Ear: External ear normal.  Left Ear: External ear normal.  Nose: Nose normal.  Mouth/Throat: Oropharynx is clear and moist.  Eyes: Pupils are equal, round, and reactive to light. Conjunctivae are normal. Right eye exhibits no discharge. Left eye exhibits no discharge.  Neck: Normal range of motion. Neck supple. No JVD present. No tracheal deviation present.  No thyromegaly present.  Cardiovascular: Normal rate, regular rhythm and normal heart sounds.  Pulmonary/Chest: No stridor. No respiratory distress. She has no wheezes.  Abdominal: Soft. Bowel sounds are normal. She exhibits no distension and no mass. There is no tenderness. There is no rebound and no guarding.  Musculoskeletal: She exhibits no edema or tenderness.  Lymphadenopathy:    She has no cervical adenopathy.  Neurological: She displays normal reflexes. No cranial nerve deficit. She exhibits normal muscle tone. Coordination normal.  Skin: No rash noted. No erythema.  Psychiatric: She has a normal mood and affect. Her behavior is normal. Judgment and thought content normal.   I spent total of 45 minutes face-to-face with patient and greater than 50% was spent counseling and or coordinating care - we discussed the need of smoking cessation; TIA treatment. ER records reviewed.  Lab Results  Component Value Date   WBC 8.8 10/15/2017   HGB 14.9 10/15/2017   HCT 45.5 10/15/2017   PLT 235 10/15/2017   GLUCOSE 78 10/15/2017   CHOL 143 02/01/2016   TRIG 141.0 02/01/2016   HDL 31.60 (L) 02/01/2016   LDLDIRECT 76.7 11/17/2008   LDLCALC 83 02/01/2016   ALT 13 (L) 10/15/2017   AST 21 10/15/2017   NA 142 10/15/2017   K 4.0 10/15/2017   CL 108 10/15/2017   CREATININE 0.98 10/15/2017   BUN 19 10/15/2017   CO2 26 10/15/2017   TSH 5.54 (H) 08/21/2017    No results found.  Assessment & Plan:   There are no diagnoses linked to this encounter. I am having Silvano Rusk. Dowson maintain her aspirin EC, colchicine, dorzolamide-timolol, furosemide, cholecalciferol, tiZANidine, traMADol, allopurinol, amLODipine, hydrochlorothiazide, levothyroxine, metoprolol tartrate, telmisartan, naproxen sodium, Pitavastatin Calcium, atorvastatin, and OVER THE COUNTER MEDICATION.  No orders of the defined types were placed in this encounter.    Follow-up: No follow-ups on file.  Walker Kehr, MD

## 2017-10-21 NOTE — Assessment & Plan Note (Signed)
Needs to d/c smoking

## 2017-10-21 NOTE — Assessment & Plan Note (Signed)
5/15 a profound weakness in legs and arms - it lasted for 2-3 hrs. Apparently her CBG was low at 69 at home (checked by EMS). No LOC ?posterior TIA: addPlavix Neurol ref Stop smoking

## 2017-10-21 NOTE — Assessment & Plan Note (Signed)
S/p derm consult and sin bx Stop Lipitor if rash returned

## 2017-10-21 NOTE — Assessment & Plan Note (Signed)
Resolved

## 2017-10-21 NOTE — Assessment & Plan Note (Signed)
Livalo - too $$$ - d/c Atorvastatin - re-started

## 2017-10-22 DIAGNOSIS — D0471 Carcinoma in situ of skin of right lower limb, including hip: Secondary | ICD-10-CM | POA: Diagnosis not present

## 2017-10-29 ENCOUNTER — Encounter: Payer: Self-pay | Admitting: Neurology

## 2017-10-30 ENCOUNTER — Ambulatory Visit (INDEPENDENT_AMBULATORY_CARE_PROVIDER_SITE_OTHER): Payer: Medicare Other | Admitting: Cardiology

## 2017-10-30 ENCOUNTER — Encounter: Payer: Self-pay | Admitting: Cardiology

## 2017-10-30 VITALS — BP 142/70 | HR 67 | Ht 63.0 in | Wt 142.2 lb

## 2017-10-30 DIAGNOSIS — F17200 Nicotine dependence, unspecified, uncomplicated: Secondary | ICD-10-CM

## 2017-10-30 DIAGNOSIS — I10 Essential (primary) hypertension: Secondary | ICD-10-CM | POA: Diagnosis not present

## 2017-10-30 DIAGNOSIS — R0789 Other chest pain: Secondary | ICD-10-CM | POA: Diagnosis not present

## 2017-10-30 DIAGNOSIS — I350 Nonrheumatic aortic (valve) stenosis: Secondary | ICD-10-CM | POA: Diagnosis not present

## 2017-10-30 DIAGNOSIS — R079 Chest pain, unspecified: Secondary | ICD-10-CM

## 2017-10-30 DIAGNOSIS — I739 Peripheral vascular disease, unspecified: Secondary | ICD-10-CM

## 2017-10-30 DIAGNOSIS — I517 Cardiomegaly: Secondary | ICD-10-CM

## 2017-10-30 DIAGNOSIS — R29898 Other symptoms and signs involving the musculoskeletal system: Secondary | ICD-10-CM

## 2017-10-30 MED ORDER — NITROGLYCERIN 0.4 MG SL SUBL: 0.4000 mg | SUBLINGUAL_TABLET | SUBLINGUAL | 2 refills | Status: DC | PRN

## 2017-10-30 NOTE — Patient Instructions (Signed)
Medication Instructions:  Continue current medications  If you need a refill on your cardiac medications before your next appointment, please call your pharmacy.  Labwork: None Ordered   Testing/Procedures: Your physician has requested that you have a lexiscan myoview. For further information please visit [email or web address]. Please follow instruction sheet, as given.   Follow-Up: Your physician wants you to follow-up in: 6 Weeks with [name].      Thank you for choosing [facility]!!

## 2017-10-30 NOTE — Assessment & Plan Note (Signed)
AAA followed by Dr Donnetta Hutching- f/u CTA to be done

## 2017-10-30 NOTE — Progress Notes (Signed)
10/30/2017 Tammy Brooks   May 18, 1934  [id number]  Primary Physician Plotnikov, Evie Lacks, MD Primary Cardiologist: [name]  HPI:  Tammy Brooks 84 y/o female, lives at home with her son, followed by [name] with a history of remote MI-cath in '99 showed mild CAD, Myoview 2015 was low risk. She has moderate AS with normal LVF by echo Aug 2018. She has PVD with mild carotid disease and a 4.9 sm AAA by [place] Jan 2019. She has COPD and smokes a pack a day.   She presented to the ED 10/02/17 with back and chest pain. She says the pain started in her Rt scapular area then radiated to her anterior chest. She says she had pain "for a few days" before she went to the ED. Chest CT showed a 4.^ cm AAA, coronary disease, and COPD. Her troponin were negative and hse had no EKG changes. She was discharged and presented back to the ED 10/15/17 with weakness in her arms and legs. Her BS was low-69. She improved after this was corrected. She tells me she has no appetite secondary to Synthroid and hadn't been eating well. She denies any chest pain or tachycardia with that episode. She saw Dr Alain Marion who has referred her to Dr Tomi Likens for neurologic evaluation and he placed her on Plavix for possible TIA. She is also scheduled to have a CTA to f/u her AAA- Dr Early follows this. Since her ED visit she has had no further weak spells or chest pain.    Current Outpatient Medications  Medication Sig Dispense Refill  . allopurinol (ZYLOPRIM) 100 MG tablet Take 1 tablet (100 mg total) by mouth daily. 90 tablet 3  . amLODipine (NORVASC) 10 MG tablet Take 1 tablet (10 mg total) by mouth daily. 90 tablet 3  . aspirin EC 81 MG tablet Take 81 mg by mouth daily.    Marland Kitchen atorvastatin (LIPITOR) 20 MG tablet Take 20 mg by mouth daily.    . cholecalciferol (VITAMIN D) 1000 units tablet Take 1,000 Units by mouth daily.    . clopidogrel (PLAVIX) 75 MG tablet Take 1 tablet (75 mg total) by mouth daily. 90 tablet 3  . colchicine 0.6 MG tablet  Take 1 tablet (0.6 mg total) by mouth 4 (four) times daily as needed. 360 tablet 2  . dorzolamide-timolol (COSOPT) 22.3-6.8 MG/ML ophthalmic solution Place 1 drop into the left eye daily. (Patient taking differently: Place 1 drop into the left eye daily. 2-0.5% sol 10.0  One drop Left eye daily) 10 mL 3  . furosemide (LASIX) 20 MG tablet TAKE 1-2 TABLETS BY MOUTH  DAILY AS NEEDED FOR EDEMA. 180 tablet 1  . hydrochlorothiazide (MICROZIDE) 12.5 MG capsule Take 1 capsule (12.5 mg total) by mouth daily. 90 capsule 3  . levothyroxine (SYNTHROID, LEVOTHROID) 50 MCG tablet Take 1 tablet (50 mcg total) by mouth daily. 90 tablet 3  . metoprolol tartrate (LOPRESSOR) 50 MG tablet TAKE ONE-HALF TABLET BY  MOUTH TWO TIMES DAILY 90 tablet 3  . naproxen sodium (ALEVE) 220 MG tablet Take 440 mg by mouth as needed (back pain).    Marland Kitchen OVER THE COUNTER MEDICATION Take 1 capsule by mouth daily. Eye vitamin    . telmisartan (MICARDIS) 40 MG tablet Take 1 tablet (40 mg total) by mouth daily. 90 tablet 3  . tiZANidine (ZANAFLEX) 4 MG tablet Take 1 tablet (4 mg total) by mouth every 8 (eight) hours as needed for muscle spasms. 90 tablet 2  .  traMADol (ULTRAM) 50 MG tablet Take 1 tablet (50 mg total) by mouth every 8 (eight) hours as needed. 100 tablet 2  . nitroGLYCERIN (NITROSTAT) 0.4 MG SL tablet Place 1 tablet (0.4 mg total) under the tongue every 5 (five) minutes as needed for chest pain. 25 tablet 2  . Pitavastatin Calcium 2 MG TABS Take 1 tablet (2 mg total) by mouth daily. (Patient not taking: Reported on 10/30/2017) 30 tablet 11   No current facility-administered medications for this visit.     Allergies  Allergen Reactions  . Nabumetone Anaphylaxis and Other (See Comments)    Causes inflammation    Past Medical History:  Diagnosis Date  . Abdominal aortic aneurysm ([place])    followed by Dr. Sherren Mocha Early  . Adenomatous colon polyp 1998  . Arthritis   . Blind left eye   . CAD (coronary artery disease) 08/1995     50-55% second diag dz.; occluded OM  . Complication of anesthesia    "Blood pressure went down" with retinal surgery - no problems with previous surgeries  . Echocardiogram abnormal 03/2012   mild to mod AS, valve area1.58 cm2, mild concentric LVH, grade I diastolic dusfunction  . Gout   . Heart murmur   . Hyperlipidemia   . Hypertension   . Myocardial infarction [facility])    mild/ age 4  . PAD (peripheral artery disease) ([facility])    mild to mod Rt ICA stenosis followed by doppler-02/05/12  . Pelvic mass   . Stroke [facility]) 2001   no residual problems  . Tobacco abuse   . Ulcer     Social History   Socioeconomic History  . Marital status: Widowed    Spouse name: Not on file  . Number of children: 5  . Years of education: 9  . Highest education level: Not on file  Occupational History  . Occupation: Oncologist    Comment: retired  Scientific laboratory technician  . Financial resource strain: Not on file  . Food insecurity:    Worry: Not on file    Inability: Not on file  . Transportation needs:    Medical: Not on file    Non-medical: Not on file  Tobacco Use  . Smoking status: Current Every Day Smoker    Packs/day: 1.00    Years: 62.00    Pack years: 62.00    Types: Cigarettes  . Smokeless tobacco: Never Used  Substance and Sexual Activity  . Alcohol use: Yes    Alcohol/week: 0.0 oz    Comment: occasionally - few drinks/month  . Drug use: No  . Sexual activity: Never    Partners: Male    Birth control/protection: None, Surgical  Lifestyle  . Physical activity:    Days per week: Not on file    Minutes per session: Not on file  . Stress: Not on file  Relationships  . Social connections:    Talks on phone: Not on file    Gets together: Not on file    Attends religious service: Not on file    Active member of club or organization: Not on file    Attends meetings of clubs or organizations: Not on file    Relationship status: Not on file  . Intimate partner violence:    Fear of  current or ex partner: Not on file    Emotionally abused: Not on file    Physically abused: Not on file    Forced sexual activity: Not on file  Other  Topics Concern  . Not on file  Social History Narrative   9th grade. married '54- widowed Dec 10th'08. 4 sons - '55, '58, '60, '69; 1 daughter - '63; 12 grandchildren; 9 great-grandchildren. 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 Other children are close and attentive. ACP/End-of-life: No CPR, no mechanical ventilation, no futile/heroic measures.     Family History  Problem Relation Age of Onset  . Aneurysm Mother        AAA  . Hypertension Mother   . Hyperlipidemia Mother   . Heart disease Father        CAD, MI x 3  . Heart attack Father   . Cancer Maternal Aunt        breast cancer  . Diabetes Sister   . Hyperlipidemia Sister   . Hypertension Sister   . Colon cancer Neg Hx   . Stomach cancer Neg Hx      Review of Systems: General: negative for chills, fever, night sweats or weight changes.  Cardiovascular: negative for chest pain, dyspnea on exertion, edema, orthopnea, palpitations, paroxysmal nocturnal dyspnea or shortness of breath Dermatological: negative for rash Respiratory: negative for cough or wheezing Urologic: negative for hematuria Abdominal: negative for nausea, vomiting, diarrhea, bright red blood per rectum, melena, or hematemesis Neurologic: negative for visual changes, syncope, or dizziness All other systems reviewed and are otherwise negative except as noted above.    Blood pressure (!) 142/70, pulse 67, height 5\' 3"  (1.6 m), weight 142 lb 3.2 oz (64.5 kg).  General appearance: alert, cooperative and no distress Neck: no JVD Lungs: clear to auscultation bilaterally Heart: regular rate and rhythm and 2/6 systolic murmur AOV, preserved S2 Extremities: dressing LLE from skin cancer surgery Skin: Skin color, texture, turgor normal. No rashes or  lesions Neurologic: Grossly normal  EKG 10/16/17- NSR, LAD, poor anterior RW  ASSESSMENT AND PLAN:   Chest pain, atypical This did not sound like angina but she had "extensive" atherosclerotic CAD on CT scan Check Myoview  Essential hypertension Controlled  Peripheral vascular disease ([place]) AAA followed by Dr Donnetta Hutching- f/u CTA to be done  Aortic stenosis Stable - moderate AS with normal LVF Aug 2018  Weakness of extremity 10/15/17 a profound weakness in legs and arms - it lasted for 2-3 hrs. Apparently her CBG was low at 69 at home (checked by EMS). No LOC ?posterior TIA: PCP added Plavix- neurologic evaluation pending  Tobacco dependence 1ppd- emphysema on CT scan   PLAN  Lexiscan Myoview- f/u with Dr berry in a few weeks. She knows to contact us if she tachycardia (? If PAF caused her symptoms 5/15). NTG SL PRN  Kerin Ransom PA-C 10/30/2017 2:16 PM

## 2017-10-30 NOTE — Assessment & Plan Note (Signed)
1ppd- emphysema on CT scan

## 2017-10-30 NOTE — Assessment & Plan Note (Signed)
Stable - moderate AS with normal LVF Aug 2018

## 2017-10-30 NOTE — Assessment & Plan Note (Signed)
10/15/17 a profound weakness in legs and arms - it lasted for 2-3 hrs. Apparently her CBG was low at 69 at home (checked by EMS). No LOC ?posterior TIA: PCP added Plavix- neurologic evaluation pending

## 2017-10-30 NOTE — Assessment & Plan Note (Signed)
This did not sound like angina but she had "extensive" atherosclerotic CAD on CT scan Check Myoview

## 2017-10-30 NOTE — Assessment & Plan Note (Signed)
Controlled.  

## 2017-12-09 ENCOUNTER — Encounter: Payer: Self-pay | Admitting: Cardiovascular Disease

## 2017-12-09 ENCOUNTER — Ambulatory Visit: Payer: Medicare Other | Admitting: Cardiovascular Disease

## 2017-12-09 VITALS — BP 142/64 | HR 54 | Ht 63.0 in | Wt 143.2 lb

## 2017-12-09 DIAGNOSIS — I2510 Atherosclerotic heart disease of native coronary artery without angina pectoris: Secondary | ICD-10-CM | POA: Diagnosis not present

## 2017-12-09 DIAGNOSIS — I350 Nonrheumatic aortic (valve) stenosis: Secondary | ICD-10-CM | POA: Diagnosis not present

## 2017-12-09 DIAGNOSIS — I10 Essential (primary) hypertension: Secondary | ICD-10-CM

## 2017-12-09 DIAGNOSIS — I739 Peripheral vascular disease, unspecified: Secondary | ICD-10-CM

## 2017-12-09 DIAGNOSIS — Z8679 Personal history of other diseases of the circulatory system: Secondary | ICD-10-CM

## 2017-12-09 DIAGNOSIS — E785 Hyperlipidemia, unspecified: Secondary | ICD-10-CM

## 2017-12-09 NOTE — Patient Instructions (Signed)
Medication Instructions:   NO CHANGE  Testing/Procedures:  Your physician has requested that you have an echocardiogram. Echocardiography is a painless test that uses sound waves to create images of your heart. It provides your doctor with information about the size and shape of your heart and how well your heart's chambers and valves are working. This procedure takes approximately one hour. There are no restrictions for this procedure.    Follow-Up:  Your physician wants you to follow-up in: Tammy Brooks will receive a reminder letter in the mail two months in advance. If you don't receive a letter, please call our office to schedule the follow-up appointment.   Your physician wants you to follow-up in: Tammy Brooks will receive a reminder letter in the mail two months in advance. If you don't receive a letter, please call our office to schedule the follow-up appointment.   If you need a refill on your cardiac medications before your next appointment, please call your pharmacy.

## 2017-12-09 NOTE — Assessment & Plan Note (Signed)
History of moderate aortic stenosis 2D echo last checked 18 valve area of 0.79 cm and a peak gradient of 49 mmHg.  We will recheck a 2D echocardiogram

## 2017-12-09 NOTE — Assessment & Plan Note (Signed)
History of dyslipidemia on statin therapy with lipid profile performed 02/01/2016 revealing total cholesterol 143, LDL 83 and HDL of 31.

## 2017-12-09 NOTE — Assessment & Plan Note (Signed)
She of mild right ICA stenosis by duplex ultrasound 517/18.  This can be checked again as needed.

## 2017-12-09 NOTE — Assessment & Plan Note (Signed)
History of essential hypertension with blood pressure measured today at 142/64.  She is on amlodipine, hydrochlorothiazide, metoprolol and Micardis.  Current meds at current dosing.

## 2017-12-09 NOTE — Progress Notes (Signed)
12/09/2017 Tammy Brooks   Oct 16, 1933  [id number]  Primary Physician Plotnikov, Evie Lacks, MD Primary Cardiologist: Lorretta Harp MD FACP, [place], [place], [place]  HPI:  TREVIA Brooks is a 84 y.o.  mildly overweight widowed Caucasian female mother of 50, grandmother of 11 grandchildren well last saw 01/07/2017. She has a history of mild CAD by cath in 1997. Her lipid problems include continued tobacco abuse one pack per day, hypertension, and hyperlipidemia all medically treated. She does have mild to moderate aortic stenosis with valve area of 1 cm2 by 2-D echo one year ago.. She also has moderate right internal carotid artery stenosis by duplex ultrasound. She was recently diagnosed with a small abdominal aortic and iliac aneurysm followed by Dr. Donnetta Hutching. She denies chest pain or shortness of breath. She was found to have a pelvic mass which was surgically excised last year and found to be benign. She does have a hernia as result of this. She has been seen in the emergency room once for chest pain and rule out PE.  CTA was negative for this although it did show coronary calcification consistent with atherosclerosis.  She was scheduled for a Myoview stress test which has been delayed until September because of fiscal constraints.  She does continue to smoke a pack a day.  She currently denies chest pain or shortness of breath.  She does have moderate aortic stenosis by 2D echo 1 year ago.    Current Meds  Medication Sig  . allopurinol (ZYLOPRIM) 100 MG tablet Take 1 tablet (100 mg total) by mouth daily.  Marland Kitchen amLODipine (NORVASC) 10 MG tablet Take 1 tablet (10 mg total) by mouth daily.  Marland Kitchen aspirin EC 81 MG tablet Take 81 mg by mouth daily.  Marland Kitchen atorvastatin (LIPITOR) 20 MG tablet Take 20 mg by mouth daily.  . cholecalciferol (VITAMIN D) 1000 units tablet Take 1,000 Units by mouth daily.  . clopidogrel (PLAVIX) 75 MG tablet Take 1 tablet (75 mg total) by mouth daily.  . colchicine 0.6 MG tablet Take 1 tablet  (0.6 mg total) by mouth 4 (four) times daily as needed.  . dorzolamide-timolol (COSOPT) 22.3-6.8 MG/ML ophthalmic solution Place 1 drop into the left eye daily. (Patient taking differently: Place 1 drop into the left eye daily. 2-0.5% sol 10.0  One drop Left eye daily)  . furosemide (LASIX) 20 MG tablet TAKE 1-2 TABLETS BY MOUTH  DAILY AS NEEDED FOR EDEMA.  . hydrochlorothiazide (MICROZIDE) 12.5 MG capsule Take 1 capsule (12.5 mg total) by mouth daily.  Marland Kitchen levothyroxine (SYNTHROID, LEVOTHROID) 50 MCG tablet Take 1 tablet (50 mcg total) by mouth daily.  . metoprolol tartrate (LOPRESSOR) 50 MG tablet TAKE ONE-HALF TABLET BY  MOUTH TWO TIMES DAILY  . naproxen sodium (ALEVE) 220 MG tablet Take 440 mg by mouth as needed (back pain).  . nitroGLYCERIN (NITROSTAT) 0.4 MG SL tablet Place 1 tablet (0.4 mg total) under the tongue every 5 (five) minutes as needed for chest pain.  Marland Kitchen OVER THE COUNTER MEDICATION Take 1 capsule by mouth daily. Eye vitamin  . Pitavastatin Calcium 2 MG TABS Take 1 tablet (2 mg total) by mouth daily.  Marland Kitchen telmisartan (MICARDIS) 40 MG tablet Take 1 tablet (40 mg total) by mouth daily.  Marland Kitchen tiZANidine (ZANAFLEX) 4 MG tablet Take 1 tablet (4 mg total) by mouth every 8 (eight) hours as needed for muscle spasms.  . traMADol (ULTRAM) 50 MG tablet Take 1 tablet (50 mg total) by mouth  every 8 (eight) hours as needed.     Allergies  Allergen Reactions  . Nabumetone Anaphylaxis and Other (See Comments)    Causes inflammation    Social History   Socioeconomic History  . Marital status: Widowed    Spouse name: Not on file  . Number of children: 5  . Years of education: 9  . Highest education level: Not on file  Occupational History  . Occupation: Oncologist    Comment: retired  Scientific laboratory technician  . Financial resource strain: Not on file  . Food insecurity:    Worry: Not on file    Inability: Not on file  . Transportation needs:    Medical: Not on file    Non-medical: Not on file    Tobacco Use  . Smoking status: Current Every Day Smoker    Packs/day: 1.00    Years: 62.00    Pack years: 62.00    Types: Cigarettes  . Smokeless tobacco: Never Used  Substance and Sexual Activity  . Alcohol use: Yes    Alcohol/week: 0.0 oz    Comment: occasionally - few drinks/month  . Drug use: No  . Sexual activity: Never    Partners: Male    Birth control/protection: None, Surgical  Lifestyle  . Physical activity:    Days per week: Not on file    Minutes per session: Not on file  . Stress: Not on file  Relationships  . Social connections:    Talks on phone: Not on file    Gets together: Not on file    Attends religious service: Not on file    Active member of club or organization: Not on file    Attends meetings of clubs or organizations: Not on file    Relationship status: Not on file  . Intimate partner violence:    Fear of current or ex partner: Not on file    Emotionally abused: Not on file    Physically abused: Not on file    Forced sexual activity: Not on file  Other Topics Concern  . Not on file  Social History Narrative   9th grade. married '54- widowed Dec 10th'08. 4 sons - '55, '58, '60, '69; 1 daughter - '63; 12 grandchildren; 9 great-grandchildren. 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 Other children are close and attentive. ACP/End-of-life: No CPR, no mechanical ventilation, no futile/heroic measures.     Review of Systems: General: negative for chills, fever, night sweats or weight changes.  Cardiovascular: negative for chest pain, dyspnea on exertion, edema, orthopnea, palpitations, paroxysmal nocturnal dyspnea or shortness of breath Dermatological: negative for rash Respiratory: negative for cough or wheezing Urologic: negative for hematuria Abdominal: negative for nausea, vomiting, diarrhea, bright red blood per rectum, melena, or hematemesis Neurologic: negative for visual changes, syncope, or  dizziness All other systems reviewed and are otherwise negative except as noted above.    Blood pressure (!) 142/64, pulse (!) 54, height 5\' 3"  (1.6 m), weight 143 lb 3.2 oz (65 kg).  General appearance: alert and no distress Neck: no adenopathy, no carotid bruit, no JVD, supple, symmetrical, trachea midline and thyroid not enlarged, symmetric, no tenderness/mass/nodules Lungs: clear to auscultation bilaterally Heart: 2/6 outflow tract murmur consistent with aortic stenosis. Extremities: extremities normal, atraumatic, no cyanosis or edema Pulses: 2+ and symmetric Skin: Skin color, texture, turgor normal. No rashes or lesions Neurologic: Alert and oriented X 3, normal strength and tone. Normal symmetric  reflexes. Normal coordination and gait  EKG not performed today  ASSESSMENT AND PLAN:   Hx of carotid artery stenosis, RT. -moderate, followed by Dr. Gwenlyn Found, Last dopplers 02/2012 She of mild right ICA stenosis by duplex ultrasound 517/18.  This can be checked again as needed.  Dyslipidemia History of dyslipidemia on statin therapy with lipid profile performed 02/01/2016 revealing total cholesterol 143, LDL 83 and HDL of 31.  Essential hypertension History of essential hypertension with blood pressure measured today at 142/64.  She is on amlodipine, hydrochlorothiazide, metoprolol and Micardis.  Current meds at current dosing.  Coronary atherosclerosis History of coronary atherosclerosis by CTA without other evidence of CAD.  Patient denies chest pain  Peripheral vascular disease ([place]) History of peripheral arterial disease with a known abdominal aortic aneurysm followed by Dr. Donnetta Hutching.  Aortic stenosis History of moderate aortic stenosis 2D echo last checked 18 valve area of 0.79 cm and a peak gradient of 49 mmHg.  We will recheck a 2D echocardiogram      Lorretta Harp MD [facility], [facility] 12/09/2017 10:12 AM

## 2017-12-09 NOTE — Assessment & Plan Note (Signed)
History of coronary atherosclerosis by CTA without other evidence of CAD.  Patient denies chest pain

## 2017-12-09 NOTE — Assessment & Plan Note (Signed)
History of peripheral arterial disease with a known abdominal aortic aneurysm followed by Dr. Donnetta Hutching.

## 2017-12-16 ENCOUNTER — Other Ambulatory Visit: Payer: Self-pay

## 2017-12-16 ENCOUNTER — Ambulatory Visit (HOSPITAL_COMMUNITY): Payer: Medicare Other | Attending: Cardiovascular Disease

## 2017-12-16 DIAGNOSIS — E785 Hyperlipidemia, unspecified: Secondary | ICD-10-CM | POA: Insufficient documentation

## 2017-12-16 DIAGNOSIS — I2510 Atherosclerotic heart disease of native coronary artery without angina pectoris: Secondary | ICD-10-CM | POA: Insufficient documentation

## 2017-12-16 DIAGNOSIS — I350 Nonrheumatic aortic (valve) stenosis: Secondary | ICD-10-CM | POA: Diagnosis not present

## 2017-12-16 DIAGNOSIS — Z720 Tobacco use: Secondary | ICD-10-CM | POA: Diagnosis not present

## 2017-12-16 DIAGNOSIS — I10 Essential (primary) hypertension: Secondary | ICD-10-CM | POA: Insufficient documentation

## 2017-12-17 ENCOUNTER — Telehealth: Payer: Self-pay | Admitting: *Deleted

## 2017-12-17 NOTE — Telephone Encounter (Addendum)
-----   Message from Lorretta Harp, MD sent at 12/16/2017  4:54 PM EDT ----- Return office visit with me to discuss results at next available    Left message for pt to call, her aortic stenosis is now severe.

## 2017-12-24 ENCOUNTER — Encounter: Payer: Medicare Other | Admitting: Internal Medicine

## 2017-12-24 NOTE — Telephone Encounter (Signed)
Spoke with pt, Follow up scheduled  

## 2017-12-30 ENCOUNTER — Ambulatory Visit (INDEPENDENT_AMBULATORY_CARE_PROVIDER_SITE_OTHER): Payer: Medicare Other | Admitting: Internal Medicine

## 2017-12-30 ENCOUNTER — Encounter: Payer: Self-pay | Admitting: Internal Medicine

## 2017-12-30 VITALS — BP 134/76 | HR 56 | Temp 97.6°F | Ht 63.0 in | Wt 143.0 lb

## 2017-12-30 DIAGNOSIS — Z8673 Personal history of transient ischemic attack (TIA), and cerebral infarction without residual deficits: Secondary | ICD-10-CM

## 2017-12-30 DIAGNOSIS — I10 Essential (primary) hypertension: Secondary | ICD-10-CM

## 2017-12-30 DIAGNOSIS — E785 Hyperlipidemia, unspecified: Secondary | ICD-10-CM

## 2017-12-30 DIAGNOSIS — Z23 Encounter for immunization: Secondary | ICD-10-CM

## 2017-12-30 DIAGNOSIS — Z0000 Encounter for general adult medical examination without abnormal findings: Secondary | ICD-10-CM

## 2017-12-30 DIAGNOSIS — I739 Peripheral vascular disease, unspecified: Secondary | ICD-10-CM

## 2017-12-30 DIAGNOSIS — F17200 Nicotine dependence, unspecified, uncomplicated: Secondary | ICD-10-CM

## 2017-12-30 MED ORDER — ZOSTER VAC RECOMB ADJUVANTED 50 MCG/0.5ML IM SUSR: 0.5000 mL | Freq: Once | INTRAMUSCULAR | 1 refills | Status: AC

## 2017-12-30 NOTE — Addendum Note (Signed)
Addended by: Leland Johns A on: 12/30/2017 10:54 AM   Modules accepted: Orders

## 2017-12-30 NOTE — Assessment & Plan Note (Signed)
Discussed smoking

## 2017-12-30 NOTE — Progress Notes (Signed)
Subjective:  Patient ID: Tammy Brooks, female    DOB: Dec 19, 1933  Age: 84 y.o. MRN: [id number]  CC: No chief complaint on file.   HPI Tammy Brooks presents for a well exam F/u TIA, HTN, COPD f/u  Outpatient Medications Prior to Visit  Medication Sig Dispense Refill  . allopurinol (ZYLOPRIM) 100 MG tablet Take 1 tablet (100 mg total) by mouth daily. 90 tablet 3  . amLODipine (NORVASC) 10 MG tablet Take 1 tablet (10 mg total) by mouth daily. 90 tablet 3  . aspirin EC 81 MG tablet Take 81 mg by mouth daily.    Marland Kitchen atorvastatin (LIPITOR) 20 MG tablet Take 20 mg by mouth daily.    . cholecalciferol (VITAMIN D) 1000 units tablet Take 1,000 Units by mouth daily.    . clopidogrel (PLAVIX) 75 MG tablet Take 1 tablet (75 mg total) by mouth daily. 90 tablet 3  . colchicine 0.6 MG tablet Take 1 tablet (0.6 mg total) by mouth 4 (four) times daily as needed. 360 tablet 2  . dorzolamide-timolol (COSOPT) 22.3-6.8 MG/ML ophthalmic solution Place 1 drop into the left eye daily. (Patient taking differently: Place 1 drop into the left eye daily. 2-0.5% sol 10.0  One drop Left eye daily) 10 mL 3  . furosemide (LASIX) 20 MG tablet TAKE 1-2 TABLETS BY MOUTH  DAILY AS NEEDED FOR EDEMA. 180 tablet 1  . hydrochlorothiazide (MICROZIDE) 12.5 MG capsule Take 1 capsule (12.5 mg total) by mouth daily. 90 capsule 3  . levothyroxine (SYNTHROID, LEVOTHROID) 50 MCG tablet Take 1 tablet (50 mcg total) by mouth daily. 90 tablet 3  . metoprolol tartrate (LOPRESSOR) 50 MG tablet TAKE ONE-HALF TABLET BY  MOUTH TWO TIMES DAILY 90 tablet 3  . naproxen sodium (ALEVE) 220 MG tablet Take 440 mg by mouth as needed (back pain).    . nitroGLYCERIN (NITROSTAT) 0.4 MG SL tablet Place 1 tablet (0.4 mg total) under the tongue every 5 (five) minutes as needed for chest pain. 25 tablet 2  . OVER THE COUNTER MEDICATION Take 1 capsule by mouth daily. Eye vitamin    . Pitavastatin Calcium 2 MG TABS Take 1 tablet (2 mg total) by mouth daily. 30  tablet 11  . telmisartan (MICARDIS) 40 MG tablet Take 1 tablet (40 mg total) by mouth daily. 90 tablet 3  . tiZANidine (ZANAFLEX) 4 MG tablet Take 1 tablet (4 mg total) by mouth every 8 (eight) hours as needed for muscle spasms. 90 tablet 2  . traMADol (ULTRAM) 50 MG tablet Take 1 tablet (50 mg total) by mouth every 8 (eight) hours as needed. 100 tablet 2   No facility-administered medications prior to visit.     ROS: Review of Systems  Constitutional: Negative for activity change, appetite change, chills, fatigue and unexpected weight change.  HENT: Negative for congestion, mouth sores and sinus pressure.   Eyes: Negative for visual disturbance.  Respiratory: Negative for cough and chest tightness.   Gastrointestinal: Negative for abdominal pain and nausea.  Genitourinary: Negative for difficulty urinating, frequency and vaginal pain.  Musculoskeletal: Positive for arthralgias. Negative for back pain and gait problem.  Skin: Negative for pallor and rash.  Neurological: Negative for dizziness, tremors, weakness, numbness and headaches.  Psychiatric/Behavioral: Negative for confusion and sleep disturbance.    Objective:  BP 134/76 (BP Location: Left Arm, Patient Position: Sitting, Cuff Size: Normal)   Pulse (!) 56   Temp 97.6 F (36.4 C) (Oral)   Ht 5\' 3"  (1.6  m)   Wt 143 lb (64.9 kg)   SpO2 96%   BMI 25.33 kg/m   BP Readings from Last 3 Encounters:  12/30/17 134/76  12/09/17 (!) 142/64  10/30/17 (!) 142/70    Wt Readings from Last 3 Encounters:  12/30/17 143 lb (64.9 kg)  12/09/17 143 lb 3.2 oz (65 kg)  10/30/17 142 lb 3.2 oz (64.5 kg)    Physical Exam  Constitutional: She appears well-developed. No distress.  HENT:  Head: Normocephalic.  Right Ear: External ear normal.  Left Ear: External ear normal.  Nose: Nose normal.  Mouth/Throat: Oropharynx is clear and moist.  Eyes: Pupils are equal, round, and reactive to light. Conjunctivae are normal. Right eye exhibits  no discharge. Left eye exhibits no discharge.  Neck: Normal range of motion. Neck supple. No JVD present. No tracheal deviation present. No thyromegaly present.  Cardiovascular: Normal rate, regular rhythm and normal heart sounds.  Pulmonary/Chest: No stridor. No respiratory distress. She has no wheezes.  Abdominal: Soft. Bowel sounds are normal. She exhibits no distension and no mass. There is no tenderness. There is no rebound and no guarding.  Musculoskeletal: She exhibits no edema or tenderness.  Lymphadenopathy:    She has no cervical adenopathy.  Neurological: She displays normal reflexes. No cranial nerve deficit. She exhibits normal muscle tone. Coordination normal.  Skin: No rash noted. No erythema.  Psychiatric: She has a normal mood and affect. Her behavior is normal. Judgment and thought content normal.  AKs on the skin A/o/c  Lab Results  Component Value Date   WBC 8.8 10/15/2017   HGB 14.9 10/15/2017   HCT 45.5 10/15/2017   PLT 235 10/15/2017   GLUCOSE 78 10/15/2017   CHOL 143 02/01/2016   TRIG 141.0 02/01/2016   HDL 31.60 (L) 02/01/2016   LDLDIRECT 76.7 11/17/2008   LDLCALC 83 02/01/2016   ALT 13 (L) 10/15/2017   AST 21 10/15/2017   NA 142 10/15/2017   K 4.0 10/15/2017   CL 108 10/15/2017   CREATININE 0.98 10/15/2017   BUN 19 10/15/2017   CO2 26 10/15/2017   TSH 5.54 (H) 08/21/2017    No results found.  Assessment & Plan:   There are no diagnoses linked to this encounter.   No orders of the defined types were placed in this encounter.    Follow-up: No follow-ups on file.  Walker Kehr, MD

## 2017-12-30 NOTE — Assessment & Plan Note (Signed)
Xarelto low dose discussed

## 2017-12-30 NOTE — Patient Instructions (Signed)
Health Maintenance for Postmenopausal Women Menopause is a normal process in which your reproductive ability comes to an end. This process happens gradually over a span of months to years, usually between the ages of 22 and 9. Menopause is complete when you have missed 12 consecutive menstrual periods. It is important to talk with your health care provider about some of the most common conditions that affect postmenopausal women, such as heart disease, cancer, and bone loss (osteoporosis). Adopting a healthy lifestyle and getting preventive care can help to promote your health and wellness. Those actions can also lower your chances of developing some of these common conditions. What should I know about menopause? During menopause, you may experience a number of symptoms, such as:  Moderate-to-severe hot flashes.  Night sweats.  Decrease in sex drive.  Mood swings.  Headaches.  Tiredness.  Irritability.  Memory problems.  Insomnia.  Choosing to treat or not to treat menopausal changes is an individual decision that you make with your health care provider. What should I know about hormone replacement therapy and supplements? Hormone therapy products are effective for treating symptoms that are associated with menopause, such as hot flashes and night sweats. Hormone replacement carries certain risks, especially as you become older. If you are thinking about using estrogen or estrogen with progestin treatments, discuss the benefits and risks with your health care provider. What should I know about heart disease and stroke? Heart disease, heart attack, and stroke become more likely as you age. This may be due, in part, to the hormonal changes that your body experiences during menopause. These can affect how your body processes dietary fats, triglycerides, and cholesterol. Heart attack and stroke are both medical emergencies. There are many things that you can do to help prevent heart disease  and stroke:  Have your blood pressure checked at least every 1-2 years. High blood pressure causes heart disease and increases the risk of stroke.  If you are 53-22 years old, ask your health care provider if you should take aspirin to prevent a heart attack or a stroke.  Do not use any tobacco products, including cigarettes, chewing tobacco, or electronic cigarettes. If you need help quitting, ask your health care provider.  It is important to eat a healthy diet and maintain a healthy weight. ? Be sure to include plenty of vegetables, fruits, low-fat dairy products, and lean protein. ? Avoid eating foods that are high in solid fats, added sugars, or salt (sodium).  Get regular exercise. This is one of the most important things that you can do for your health. ? Try to exercise for at least 150 minutes each week. The type of exercise that you do should increase your heart rate and make you sweat. This is known as moderate-intensity exercise. ? Try to do strengthening exercises at least twice each week. Do these in addition to the moderate-intensity exercise.  Know your numbers.Ask your health care provider to check your cholesterol and your blood glucose. Continue to have your blood tested as directed by your health care provider.  What should I know about cancer screening? There are several types of cancer. Take the following steps to reduce your risk and to catch any cancer development as early as possible. Breast Cancer  Practice breast self-awareness. ? This means understanding how your breasts normally appear and feel. ? It also means doing regular breast self-exams. Let your health care provider know about any changes, no matter how small.  If you are 40  or older, have a clinician do a breast exam (clinical breast exam or CBE) every year. Depending on your age, family history, and medical history, it may be recommended that you also have a yearly breast X-ray (mammogram).  If you  have a family history of breast cancer, talk with your health care provider about genetic screening.  If you are at high risk for breast cancer, talk with your health care provider about having an MRI and a mammogram every year.  Breast cancer (BRCA) gene test is recommended for women who have family members with BRCA-related cancers. Results of the assessment will determine the need for genetic counseling and BRCA1 and for BRCA2 testing. BRCA-related cancers include these types: ? Breast. This occurs in males or females. ? Ovarian. ? Tubal. This may also be called fallopian tube cancer. ? Cancer of the abdominal or pelvic lining (peritoneal cancer). ? Prostate. ? Pancreatic.  Cervical, Uterine, and Ovarian Cancer Your health care provider may recommend that you be screened regularly for cancer of the pelvic organs. These include your ovaries, uterus, and vagina. This screening involves a pelvic exam, which includes checking for microscopic changes to the surface of your cervix (Pap test).  For women ages 21-65, health care providers may recommend a pelvic exam and a Pap test every three years. For women ages 79-65, they may recommend the Pap test and pelvic exam, combined with testing for human papilloma virus (HPV), every five years. Some types of HPV increase your risk of cervical cancer. Testing for HPV may also be done on women of any age who have unclear Pap test results.  Other health care providers may not recommend any screening for nonpregnant women who are considered low risk for pelvic cancer and have no symptoms. Ask your health care provider if a screening pelvic exam is right for you.  If you have had past treatment for cervical cancer or a condition that could lead to cancer, you need Pap tests and screening for cancer for at least 20 years after your treatment. If Pap tests have been discontinued for you, your risk factors (such as having a new sexual partner) need to be  reassessed to determine if you should start having screenings again. Some women have medical problems that increase the chance of getting cervical cancer. In these cases, your health care provider may recommend that you have screening and Pap tests more often.  If you have a family history of uterine cancer or ovarian cancer, talk with your health care provider about genetic screening.  If you have vaginal bleeding after reaching menopause, tell your health care provider.  There are currently no reliable tests available to screen for ovarian cancer.  Lung Cancer Lung cancer screening is recommended for adults 69-62 years old who are at high risk for lung cancer because of a history of smoking. A yearly low-dose CT scan of the lungs is recommended if you:  Currently smoke.  Have a history of at least 30 pack-years of smoking and you currently smoke or have quit within the past 15 years. A pack-year is smoking an average of one pack of cigarettes per day for one year.  Yearly screening should:  Continue until it has been 15 years since you quit.  Stop if you develop a health problem that would prevent you from having lung cancer treatment.  Colorectal Cancer  This type of cancer can be detected and can often be prevented.  Routine colorectal cancer screening usually begins at  age 42 and continues through age 45.  If you have risk factors for colon cancer, your health care provider may recommend that you be screened at an earlier age.  If you have a family history of colorectal cancer, talk with your health care provider about genetic screening.  Your health care provider may also recommend using home test kits to check for hidden blood in your stool.  A small camera at the end of a tube can be used to examine your colon directly (sigmoidoscopy or colonoscopy). This is done to check for the earliest forms of colorectal cancer.  Direct examination of the colon should be repeated every  5-10 years until age 71. However, if early forms of precancerous polyps or small growths are found or if you have a family history or genetic risk for colorectal cancer, you may need to be screened more often.  Skin Cancer  Check your skin from head to toe regularly.  Monitor any moles. Be sure to tell your health care provider: ? About any new moles or changes in moles, especially if there is a change in a mole's shape or color. ? If you have a mole that is larger than the size of a pencil eraser.  If any of your family members has a history of skin cancer, especially at a young age, talk with your health care provider about genetic screening.  Always use sunscreen. Apply sunscreen liberally and repeatedly throughout the day.  Whenever you are outside, protect yourself by wearing long sleeves, pants, a wide-brimmed hat, and sunglasses.  What should I know about osteoporosis? Osteoporosis is a condition in which bone destruction happens more quickly than new bone creation. After menopause, you may be at an increased risk for osteoporosis. To help prevent osteoporosis or the bone fractures that can happen because of osteoporosis, the following is recommended:  If you are 46-71 years old, get at least 1,000 mg of calcium and at least 600 mg of vitamin D per day.  If you are older than age 55 but younger than age 65, get at least 1,200 mg of calcium and at least 600 mg of vitamin D per day.  If you are older than age 54, get at least 1,200 mg of calcium and at least 800 mg of vitamin D per day.  Smoking and excessive alcohol intake increase the risk of osteoporosis. Eat foods that are rich in calcium and vitamin D, and do weight-bearing exercises several times each week as directed by your health care provider. What should I know about how menopause affects my mental health? Depression may occur at any age, but it is more common as you become older. Common symptoms of depression  include:  Low or sad mood.  Changes in sleep patterns.  Changes in appetite or eating patterns.  Feeling an overall lack of motivation or enjoyment of activities that you previously enjoyed.  Frequent crying spells.  Talk with your health care provider if you think that you are experiencing depression. What should I know about immunizations? It is important that you get and maintain your immunizations. These include:  Tetanus, diphtheria, and pertussis (Tdap) booster vaccine.  Influenza every year before the flu season begins.  Pneumonia vaccine.  Shingles vaccine.  Your health care provider may also recommend other immunizations. This information is not intended to replace advice given to you by your health care provider. Make sure you discuss any questions you have with your health care provider. Document Released: 07/12/2005  Document Revised: 12/08/2015 Document Reviewed: 02/21/2015 Elsevier Interactive Patient Education  [employer].

## 2017-12-30 NOTE — Assessment & Plan Note (Signed)
Lipitor 

## 2017-12-30 NOTE — Addendum Note (Signed)
Addended by: Karren Cobble on: 12/30/2017 09:51 AM   Modules accepted: Orders

## 2017-12-30 NOTE — Assessment & Plan Note (Signed)
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 The patient's medical, surgical and social history  2. Their use of alcohol, tobacco or illicit drugs  3. Their current medications and supplements  4. The patient's functional ability including ADL's, fall risks, home safety risks and hearing or visual impairment.  5. Diet and physical activities  6. Evidence for depression or mood disorders 7. The roster of all physicians providing medical care to patient - is listed in the Snapshot section of the chart and reviewed today.    Today patient counseled on age appropriate routine health concerns for screening and prevention, each reviewed and up to date or declined. Immunizations reviewed and up to date or declined. Labs ordered and reviewed. Risk factors for depression reviewed and negative. Hearing function and visual acuity are intact. ADLs screened and addressed as needed. Functional ability and level of safety reviewed and appropriate. Education, counseling and referrals performed based on assessed risks today. Patient provided with a copy of personalized plan for preventive services.

## 2017-12-30 NOTE — Assessment & Plan Note (Signed)
Micardis HCTZ

## 2017-12-30 NOTE — Assessment & Plan Note (Signed)
No relapse On meds

## 2018-01-06 ENCOUNTER — Other Ambulatory Visit: Payer: Self-pay

## 2018-01-06 ENCOUNTER — Encounter: Payer: Self-pay | Admitting: Vascular Surgery

## 2018-01-06 ENCOUNTER — Ambulatory Visit: Payer: Medicare Other | Admitting: Vascular Surgery

## 2018-01-06 ENCOUNTER — Ambulatory Visit
Admission: RE | Admit: 2018-01-06 | Discharge: 2018-01-06 | Disposition: A | Discharge status: Home / Self Care | Payer: Medicare Other | Source: Ambulatory Visit | Attending: Family | Admitting: Family

## 2018-01-06 VITALS — BP 158/72 | HR 67 | Temp 97.1°F | Resp 20 | Ht 63.0 in | Wt 144.0 lb

## 2018-01-06 DIAGNOSIS — I714 Abdominal aortic aneurysm, without rupture, unspecified: Secondary | ICD-10-CM

## 2018-01-06 MED ORDER — IOPAMIDOL (ISOVUE-370) INJECTION 76%
75.0000 mL | Freq: Once | INTRAVENOUS | Status: AC | PRN
  Administered 2018-01-06: 75 mL via INTRAVENOUS

## 2018-01-06 NOTE — Progress Notes (Signed)
[facility] [place]  Patient name: Tammy Brooks MRN: [id number] DOB: 1933-12-15 Sex: female  REASON FOR VISIT: Discuss abdominal aortic aneurysm  HPI: Tammy Brooks is a 84 y.o. female here today for continued follow-up of her abdominal aortic aneurysm.  She has had serial ultrasounds and has had evidence of slow expansion over time.  She had a recent CT scan and is here today for discussion of the findings of this.  She has no symptoms referable to her aneurysm and no peripheral symptoms.  She does have known coronary disease and apparently is having work-up of this within several weeks.  Past Medical History:  Diagnosis Date  . Abdominal aortic aneurysm ([place])    followed by Dr. Sherren Mocha Marlayna Bannister  . Adenomatous colon polyp 1998  . Arthritis   . Blind left eye   . CAD (coronary artery disease) 08/1995   50-55% second diag dz.; occluded OM  . Complication of anesthesia    "Blood pressure went down" with retinal surgery - no problems with previous surgeries  . Echocardiogram abnormal 03/2012   mild to mod AS, valve area1.58 cm2, mild concentric LVH, grade I diastolic dusfunction  . Gout   . Heart murmur   . Hyperlipidemia   . Hypertension   . Myocardial infarction [facility])    mild/ age 89  . PAD (peripheral artery disease) ([facility])    mild to mod Rt ICA stenosis followed by doppler-02/05/12  . Pelvic mass   . Stroke [facility]) 2001   no residual problems  . Tobacco abuse   . Ulcer     Family History  Problem Relation Age of Onset  . Aneurysm Mother        AAA  . Hypertension Mother   . Hyperlipidemia Mother   . Heart disease Father        CAD, MI x 3  . Heart attack Father   . Cancer Maternal Aunt        breast cancer  . Diabetes Sister   . Hyperlipidemia Sister   . Hypertension Sister   . Colon cancer Neg Hx   . Stomach cancer Neg Hx     SOCIAL HISTORY: Social History   Tobacco Use  . Smoking status: Current Every Day  Smoker    Packs/day: 1.00    Years: 62.00    Pack years: 62.00    Types: Cigarettes  . Smokeless tobacco: Never Used  Substance Use Topics  . Alcohol use: Yes    Alcohol/week: 0.0 oz    Comment: occasionally - few drinks/month    Allergies  Allergen Reactions  . Nabumetone Anaphylaxis and Other (See Comments)    Causes inflammation    Current Outpatient Medications  Medication Sig Dispense Refill  . allopurinol (ZYLOPRIM) 100 MG tablet Take 1 tablet (100 mg total) by mouth daily. 90 tablet 3  . amLODipine (NORVASC) 10 MG tablet Take 1 tablet (10 mg total) by mouth daily. 90 tablet 3  . aspirin EC 81 MG tablet Take 81 mg by mouth daily.    Marland Kitchen atorvastatin (LIPITOR) 20 MG tablet Take 20 mg by mouth daily.    . cholecalciferol (VITAMIN D) 1000 units tablet Take 1,000 Units by mouth daily.    . clopidogrel (PLAVIX) 75 MG tablet Take 1 tablet (75 mg total) by mouth daily. 90 tablet 3  . colchicine 0.6 MG tablet Take 1 tablet (0.6 mg total) by mouth 4 (four) times daily as needed. 360 tablet  2  . dorzolamide-timolol (COSOPT) 22.3-6.8 MG/ML ophthalmic solution Place 1 drop into the left eye daily. (Patient taking differently: Place 1 drop into the left eye daily. 2-0.5% sol 10.0  One drop Left eye daily) 10 mL 3  . furosemide (LASIX) 20 MG tablet TAKE 1-2 TABLETS BY MOUTH  DAILY AS NEEDED FOR EDEMA. 180 tablet 1  . hydrochlorothiazide (MICROZIDE) 12.5 MG capsule Take 1 capsule (12.5 mg total) by mouth daily. 90 capsule 3  . levothyroxine (SYNTHROID, LEVOTHROID) 50 MCG tablet Take 1 tablet (50 mcg total) by mouth daily. 90 tablet 3  . metoprolol tartrate (LOPRESSOR) 50 MG tablet TAKE ONE-HALF TABLET BY  MOUTH TWO TIMES DAILY 90 tablet 3  . naproxen sodium (ALEVE) 220 MG tablet Take 440 mg by mouth as needed (back pain).    . nitroGLYCERIN (NITROSTAT) 0.4 MG SL tablet Place 1 tablet (0.4 mg total) under the tongue every 5 (five) minutes as needed for chest pain. 25 tablet 2  . OVER THE  COUNTER MEDICATION Take 1 capsule by mouth daily. Eye vitamin    . Pitavastatin Calcium 2 MG TABS Take 1 tablet (2 mg total) by mouth daily. 30 tablet 11  . telmisartan (MICARDIS) 40 MG tablet Take 1 tablet (40 mg total) by mouth daily. 90 tablet 3  . tiZANidine (ZANAFLEX) 4 MG tablet Take 1 tablet (4 mg total) by mouth every 8 (eight) hours as needed for muscle spasms. 90 tablet 2  . traMADol (ULTRAM) 50 MG tablet Take 1 tablet (50 mg total) by mouth every 8 (eight) hours as needed. 100 tablet 2   No current facility-administered medications for this visit.     REVIEW OF SYSTEMS:  [X]  denotes positive finding, [ ]  denotes negative finding Cardiac  Comments:  Chest pain or chest pressure:    Shortness of breath upon exertion:    Short of breath when lying flat:    Irregular heart rhythm:        Vascular    Pain in calf, thigh, or hip brought on by ambulation:    Pain in feet at night that wakes you up from your sleep:     Blood clot in your veins:    Leg swelling:           PHYSICAL EXAM: Vitals:   01/06/18 1229  BP: (!) 158/72  Pulse: 67  Resp: 20  Temp: (!) 97.1 F (36.2 C)  SpO2: 97%  Weight: 144 lb (65.3 kg)  Height: 5\' 3"  (1.6 m)    GENERAL: The patient is a well-nourished female, in no acute distress. The vital signs are documented above. CARDIOVASCULAR: Plus radial and 2+ dorsalis pedis pulses bilaterally.  Abdomen soft and nontender.  I do not palpate an aneurysm PULMONARY: There is good air exchange  MUSCULOSKELETAL: There are no major deformities or cyanosis. NEUROLOGIC: No focal weakness or paresthesias are detected. SKIN: There are no ulcers or rashes noted. PSYCHIATRIC: The patient has a normal affect.  DATA:  CT scan shows mention of her aneurysm to 5.0 cm from 4.6 cm 1 year ago.  She does have extension of the aneurysm up to the level of the renal arteries and no evidence of extensive pelvic aneurysms.  MEDICAL ISSUES: Had a long discussion with the  patient regarding this.  She has not had a rapid expansion.  She has reached 5 cm diameter.  I did explain that this is the lower threshold of where discussion of potential surgical intervention is undertaken.  Unfortunately she does not appear to be a stent graft candidate due to her juxtarenal aneurysm and extensive calcification above this.  I would recommend follow-up in 6 months with ultrasound.  This will also give her the opportunity for clarification of her cardiac status.  I did explain the potential 5 %/year risk of rupture with her current aneurysm and feel that her open surgical repair risk would be in the several percentile range as well.  Will see her again in 6 months for further discussion    Rosetta Posner, MD [facility] Tel [phone] Pager [phone]

## 2018-01-12 NOTE — Progress Notes (Addendum)
NEUROLOGY CONSULTATION NOTE  SEPTEMBER MORMILE MRN: [id number] DOB: September 17, 1933  Referring provider: Dr. Alain Marion Primary care provider: Dr. Alain Marion  Reason for consult:  TIA  HISTORY OF PRESENT ILLNESS: Tammy Brooks is an 84 year old female with hypertension, hyperlipidemia, CAD s/p MI at 84 years old, peripheral artery disease, abdominal aortic aneurysm, blind in left eye, arthritis and history of stroke who presents for weakness and possible TIA.  She is accompanied by her daughter-in-law who supplements history.  History supplemented by ED and referring provider's not.    On 10/15/17, she was sitting in the kitchen preparing dinner when she felt sudden onset of diffuse weakness, involving both arms and legs.  She couldn't move her legs.  She tried to get up to move around.  She sat down again and couldn't move her legs again.  She had trouble moving both legs, some freezing of gait. No pain or numbness in the legs.  She reported slurred speech.  She felt fatigued.  Arms felt heavy.  No unilateral numbness or weakness.  Symptoms lasted about 3 hours and resolved.  Later that day, symptoms returned.  There was no associated loss of consciousness or feeling of going to pass out, complete vision loss, paresthesias, headache, nausea, or unilateral numbness or weakness.  She called EMS and was found to have a blood sugar of 69.  She reported that she had only eaten for 24 hours.  She did drink about 2 glasses of water.  She was taken to the ED at [facility] for further evaluation.  Labs, inclujding CBC, CMP, and UA demonstrated no evidence of infection, dehydration or electrolyte imbalance (serum glucose was 126).  Troponins and EKG demonstrated no acute coronary event.  Echocardiogram from 12/16/17 showed EF 60-65%.  She has a carotid doppler every year as ordered by her cardiologist.  Last carotid doppler from 01/17/17 showed no hemodynamically significant stenosis.  She has not had one  performed yet for this year.  In 2001, she woke up that morning and went down to the kitchen, drinking a cup of coffee at the table when she developed tinnitus and lightheadedness.  She went to the sofa and passed out.  She states she was unconscious at the hospital until the next morning.  MRI reportedly negative for stroke.  She was started on Plavix for possible stroke at that time but was subsequently switched back to ASA.  PAST MEDICAL HISTORY: Past Medical History:  Diagnosis Date  . Abdominal aortic aneurysm ([place])    followed by Dr. Sherren Mocha Early  . Adenomatous colon polyp 1998  . Arthritis   . Blind left eye   . CAD (coronary artery disease) 08/1995   50-55% second diag dz.; occluded OM  . Complication of anesthesia    "Blood pressure went down" with retinal surgery - no problems with previous surgeries  . Echocardiogram abnormal 03/2012   mild to mod AS, valve area1.58 cm2, mild concentric LVH, grade I diastolic dusfunction  . Gout   . Heart murmur   . Hyperlipidemia   . Hypertension   . Myocardial infarction [facility])    mild/ age 73  . PAD (peripheral artery disease) (HCC)    mild to mod Rt ICA stenosis followed by doppler-02/05/12  . Pelvic mass   . Stroke [facility]) 2001   no residual problems  . Tobacco abuse   . Ulcer     PAST SURGICAL HISTORY: Past Surgical History:  Procedure Laterality Date  .  ABDOMINAL HYSTERECTOMY    . APPENDECTOMY    . CARDIAC CATHETERIZATION  08/1995   50-55% second diag lesion, occluded OM  . CATARACT EXTRACTION W/ INTRAOCULAR LENS  IMPLANT, BILATERAL    . COLONOSCOPY    . EYE SURGERY     lense implant removed/ blind in left eye  . LAPAROTOMY N/A 04/26/2014   Procedure: EXPLORATORY LAPAROTOMY/RESECTION OF PELVIC MASS;  Surgeon: Alvino Chapel, MD;  Location: [facility];  Service: Gynecology;  Laterality: N/A;  . OOPHORECTOMY     and fallopian tube  . POLYPECTOMY    . RETINAL DETACHMENT SURGERY  2006  . TONSILLECTOMY AND ADENOIDECTOMY      . TUBAL LIGATION    . UPPER GASTROINTESTINAL ENDOSCOPY     treated for h-pylori    MEDICATIONS: Current Outpatient Medications on File Prior to Visit  Medication Sig Dispense Refill  . allopurinol (ZYLOPRIM) 100 MG tablet Take 1 tablet (100 mg total) by mouth daily. 90 tablet 3  . amLODipine (NORVASC) 10 MG tablet Take 1 tablet (10 mg total) by mouth daily. 90 tablet 3  . aspirin EC 81 MG tablet Take 81 mg by mouth daily.    Marland Kitchen atorvastatin (LIPITOR) 20 MG tablet Take 20 mg by mouth daily.    . cholecalciferol (VITAMIN D) 1000 units tablet Take 1,000 Units by mouth daily.    . clopidogrel (PLAVIX) 75 MG tablet Take 1 tablet (75 mg total) by mouth daily. 90 tablet 3  . colchicine 0.6 MG tablet Take 1 tablet (0.6 mg total) by mouth 4 (four) times daily as needed. 360 tablet 2  . dorzolamide-timolol (COSOPT) 22.3-6.8 MG/ML ophthalmic solution Place 1 drop into the left eye daily. (Patient taking differently: Place 1 drop into the left eye daily. 2-0.5% sol 10.0  One drop Left eye daily) 10 mL 3  . furosemide (LASIX) 20 MG tablet TAKE 1-2 TABLETS BY MOUTH  DAILY AS NEEDED FOR EDEMA. 180 tablet 1  . hydrochlorothiazide (MICROZIDE) 12.5 MG capsule Take 1 capsule (12.5 mg total) by mouth daily. 90 capsule 3  . levothyroxine (SYNTHROID, LEVOTHROID) 50 MCG tablet Take 1 tablet (50 mcg total) by mouth daily. 90 tablet 3  . metoprolol tartrate (LOPRESSOR) 50 MG tablet TAKE ONE-HALF TABLET BY  MOUTH TWO TIMES DAILY 90 tablet 3  . naproxen sodium (ALEVE) 220 MG tablet Take 440 mg by mouth as needed (back pain).    . nitroGLYCERIN (NITROSTAT) 0.4 MG SL tablet Place 1 tablet (0.4 mg total) under the tongue every 5 (five) minutes as needed for chest pain. 25 tablet 2  . OVER THE COUNTER MEDICATION Take 1 capsule by mouth daily. Eye vitamin    . Pitavastatin Calcium 2 MG TABS Take 1 tablet (2 mg total) by mouth daily. 30 tablet 11  . telmisartan (MICARDIS) 40 MG tablet Take 1 tablet (40 mg total) by mouth  daily. 90 tablet 3  . tiZANidine (ZANAFLEX) 4 MG tablet Take 1 tablet (4 mg total) by mouth every 8 (eight) hours as needed for muscle spasms. 90 tablet 2  . traMADol (ULTRAM) 50 MG tablet Take 1 tablet (50 mg total) by mouth every 8 (eight) hours as needed. 100 tablet 2   No current facility-administered medications on file prior to visit.     ALLERGIES: Allergies  Allergen Reactions  . Nabumetone Anaphylaxis and Other (See Comments)    Causes inflammation    FAMILY HISTORY: Family History  Problem Relation Age of Onset  . Aneurysm Mother  AAA  . Hypertension Mother   . Hyperlipidemia Mother   . Heart disease Father        CAD, MI x 3  . Heart attack Father   . Cancer Maternal Aunt        breast cancer  . Diabetes Sister   . Hyperlipidemia Sister   . Hypertension Sister   . Colon cancer Neg Hx   . Stomach cancer Neg Hx     SOCIAL HISTORY: Social History   Socioeconomic History  . Marital status: Widowed    Spouse name: Not on file  . Number of children: 5  . Years of education: 9  . Highest education level: Not on file  Occupational History  . Occupation: Oncologist    Comment: retired  Scientific laboratory technician  . Financial resource strain: Not on file  . Food insecurity:    Worry: Not on file    Inability: Not on file  . Transportation needs:    Medical: Not on file    Non-medical: Not on file  Tobacco Use  . Smoking status: Current Every Day Smoker    Packs/day: 1.00    Years: 62.00    Pack years: 62.00    Types: Cigarettes  . Smokeless tobacco: Never Used  Substance and Sexual Activity  . Alcohol use: Yes    Alcohol/week: 0.0 standard drinks    Comment: occasionally - few drinks/month  . Drug use: No  . Sexual activity: Never    Partners: Male    Birth control/protection: None, Surgical  Lifestyle  . Physical activity:    Days per week: Not on file    Minutes per session: Not on file  . Stress: Not on file  Relationships  . Social  connections:    Talks on phone: Not on file    Gets together: Not on file    Attends religious service: Not on file    Active member of club or organization: Not on file    Attends meetings of clubs or organizations: Not on file    Relationship status: Not on file  . Intimate partner violence:    Fear of current or ex partner: Not on file    Emotionally abused: Not on file    Physically abused: Not on file    Forced sexual activity: Not on file  Other Topics Concern  . Not on file  Social History Narrative   9th grade. married '54- widowed Dec 10th'08. 4 sons - '55, '58, '60, '69; 1 daughter - '63; 12 grandchildren; 9 great-grandchildren. 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 Other children are close and attentive. ACP/End-of-life: No CPR, no mechanical ventilation, no futile/heroic measures.    REVIEW OF SYSTEMS: Constitutional: No fevers, chills, or sweats, no generalized fatigue, change in appetite Eyes: No visual changes, double vision, eye pain Ear, nose and throat: No hearing loss, ear pain, nasal congestion, sore throat Cardiovascular: No chest pain, palpitations Respiratory:  No shortness of breath at rest or with exertion, wheezes GastrointestinaI: No nausea, vomiting, diarrhea, abdominal pain, fecal incontinence Genitourinary:  No dysuria, urinary retention or frequency Musculoskeletal:  No neck pain, back pain Integumentary: No rash, pruritus, skin lesions Neurological: as above Psychiatric: No depression, insomnia, anxiety Endocrine: No palpitations, fatigue, diaphoresis, mood swings, change in appetite, change in weight, increased thirst Hematologic/Lymphatic:  No purpura, petechiae. Allergic/Immunologic: no itchy/runny eyes, nasal congestion, recent allergic reactions, rashes  PHYSICAL EXAM: Blood pressure 136/74,  pulse 63, height 5\' 3"  (1.6 m), weight 143 lb (64.9 kg), SpO2 96 %. General: No acute distress.  Patient  appears well-groomed.  Head:  Normocephalic/atraumatic Eyes:  fundi examined but not visualized Neck: supple, no paraspinal tenderness, full range of motion Back: No paraspinal tenderness Heart: regular rate and rhythm Lungs: Clear to auscultation bilaterally. Vascular: No carotid bruits. Neurological Exam: Mental status: alert and oriented to person, place, and time, recent and remote memory intact, fund of knowledge intact, attention and concentration intact, speech fluent and not dysarthric, language intact. Cranial nerves: CN I: not tested CN II: left surgical pupil, right pupil round and reactive to light, blind in left eye CN III, IV, VI:  full range of motion, no nystagmus, no ptosis CN V: facial sensation intact CN VII: upper and lower face symmetric CN VIII: hearing intact CN IX, X: gag intact, uvula midline CN XI: sternocleidomastoid and trapezius muscles intact CN XII: tongue midline Bulk & Tone: normal, no fasciculations. Motor:  5/5 throughout  Sensation: temperature and vibration sensation intact. Deep Tendon Reflexes:  2+ throughout, toes downgoing.  Finger to nose testing:  Without dysmetria.  Heel to shin:  Without dysmetria.  Gait:  Normal station and stride.  Romberg negative.  IMPRESSION: 1.  Transient generalized weakness.  Symptoms not definite to suggest TIA (II am not sure if her prior event from 2001 was a TIA based on her description but I do not have those records).  I don't attribute hypoglycemia to her symptoms as her serum glucose was not too low.  She didn't eat for the previous 24 hours and only had 2 glasses of water, which may have contributed. Regardless, she has been treated for TIA with 3 months of dual antiplatelet therapy.  PLAN: 1.  As she has already been on dual antiplatelet therapy for 3 months, we will now discontinue Plavix and she should continue ASA 81mg  daily for secondary stroke prevention 2.  Recommend repeat carotid doppler.  She  wishes to have this done with her cardiologist. 3.  Continue management of stroke risk factors as managed by her PCP and cardiologist (blood pressure control, statin therapy with LDL goal less than 70) 4.  We discussed performing MRI of brain.  She defers at this time as it likely will not change management.  It is 3 months out from her event.  Even if chronic infarcts are seen on imaging, I would not really be able to say if any one of them contributed to her symptoms. 5.  Follow up as needed.  Thank you for allowing me to take part in the care of this patient.  Metta Clines, DO  CC: Dr. Alain Marion

## 2018-01-13 ENCOUNTER — Ambulatory Visit: Payer: Medicare Other | Admitting: Neurology

## 2018-01-13 ENCOUNTER — Encounter: Payer: Self-pay | Admitting: Neurology

## 2018-01-13 VITALS — BP 136/74 | HR 63 | Ht 63.0 in | Wt 143.0 lb

## 2018-01-13 DIAGNOSIS — R2990 Unspecified symptoms and signs involving the nervous system: Secondary | ICD-10-CM

## 2018-01-13 NOTE — Patient Instructions (Signed)
I can't say for sure if you had a stroke because your symptoms were not classic.  However, you have already been treated acutely for possible stroke. 1.  At this time, you may stop the Plavix and just continue aspirin 81mg  daily 2.  Continue medications for blood pressure and cholesterol 3.  Follow up carotid doppler with your cardiologist 4.  Follow up as needed.

## 2018-01-19 ENCOUNTER — Other Ambulatory Visit: Payer: Self-pay

## 2018-01-19 DIAGNOSIS — I714 Abdominal aortic aneurysm, without rupture, unspecified: Secondary | ICD-10-CM

## 2018-01-20 ENCOUNTER — Encounter (HOSPITAL_COMMUNITY): Payer: Medicare Other

## 2018-01-25 DIAGNOSIS — R079 Chest pain, unspecified: Secondary | ICD-10-CM | POA: Diagnosis not present

## 2018-01-25 DIAGNOSIS — I6529 Occlusion and stenosis of unspecified carotid artery: Secondary | ICD-10-CM | POA: Diagnosis not present

## 2018-01-25 DIAGNOSIS — I252 Old myocardial infarction: Secondary | ICD-10-CM | POA: Diagnosis not present

## 2018-01-25 DIAGNOSIS — I472 Ventricular tachycardia: Secondary | ICD-10-CM | POA: Diagnosis not present

## 2018-01-25 DIAGNOSIS — E785 Hyperlipidemia, unspecified: Secondary | ICD-10-CM | POA: Diagnosis not present

## 2018-01-25 DIAGNOSIS — Z888 Allergy status to other drugs, medicaments and biological substances status: Secondary | ICD-10-CM | POA: Diagnosis not present

## 2018-01-25 DIAGNOSIS — I213 ST elevation (STEMI) myocardial infarction of unspecified site: Secondary | ICD-10-CM | POA: Diagnosis not present

## 2018-01-25 DIAGNOSIS — I2121 ST elevation (STEMI) myocardial infarction involving left circumflex coronary artery: Secondary | ICD-10-CM | POA: Diagnosis not present

## 2018-01-25 DIAGNOSIS — Z7982 Long term (current) use of aspirin: Secondary | ICD-10-CM | POA: Diagnosis not present

## 2018-01-25 DIAGNOSIS — I350 Nonrheumatic aortic (valve) stenosis: Secondary | ICD-10-CM | POA: Diagnosis not present

## 2018-01-25 DIAGNOSIS — I2510 Atherosclerotic heart disease of native coronary artery without angina pectoris: Secondary | ICD-10-CM | POA: Diagnosis not present

## 2018-01-25 DIAGNOSIS — R9431 Abnormal electrocardiogram [ECG] [EKG]: Secondary | ICD-10-CM | POA: Diagnosis not present

## 2018-01-25 DIAGNOSIS — I739 Peripheral vascular disease, unspecified: Secondary | ICD-10-CM | POA: Diagnosis not present

## 2018-01-25 DIAGNOSIS — I2119 ST elevation (STEMI) myocardial infarction involving other coronary artery of inferior wall: Secondary | ICD-10-CM | POA: Diagnosis not present

## 2018-01-25 DIAGNOSIS — E7849 Other hyperlipidemia: Secondary | ICD-10-CM | POA: Diagnosis not present

## 2018-01-25 DIAGNOSIS — I10 Essential (primary) hypertension: Secondary | ICD-10-CM | POA: Diagnosis not present

## 2018-01-25 DIAGNOSIS — Z79899 Other long term (current) drug therapy: Secondary | ICD-10-CM | POA: Diagnosis not present

## 2018-01-25 DIAGNOSIS — R0789 Other chest pain: Secondary | ICD-10-CM | POA: Diagnosis not present

## 2018-01-25 DIAGNOSIS — I959 Hypotension, unspecified: Secondary | ICD-10-CM | POA: Diagnosis not present

## 2018-01-25 DIAGNOSIS — Z79891 Long term (current) use of opiate analgesic: Secondary | ICD-10-CM | POA: Diagnosis not present

## 2018-01-25 DIAGNOSIS — E876 Hypokalemia: Secondary | ICD-10-CM | POA: Diagnosis not present

## 2018-01-25 DIAGNOSIS — Z8249 Family history of ischemic heart disease and other diseases of the circulatory system: Secondary | ICD-10-CM | POA: Diagnosis not present

## 2018-01-25 DIAGNOSIS — I714 Abdominal aortic aneurysm, without rupture: Secondary | ICD-10-CM | POA: Diagnosis not present

## 2018-01-25 DIAGNOSIS — Z8673 Personal history of transient ischemic attack (TIA), and cerebral infarction without residual deficits: Secondary | ICD-10-CM | POA: Diagnosis not present

## 2018-01-25 DIAGNOSIS — I219 Acute myocardial infarction, unspecified: Secondary | ICD-10-CM

## 2018-01-25 HISTORY — PX: CARDIAC CATHETERIZATION: SHX172

## 2018-01-25 HISTORY — DX: Acute myocardial infarction, unspecified: I21.9

## 2018-01-28 MED ORDER — LEVOTHYROXINE SODIUM 50 MCG PO TABS
50.00 | ORAL_TABLET | ORAL | Status: DC
Start: 2018-01-29 — End: 2018-01-28

## 2018-01-28 MED ORDER — SODIUM CHLORIDE 0.9 % IV SOLN
INTRAVENOUS | Status: DC
Start: ? — End: 2018-01-28

## 2018-01-28 MED ORDER — TRAMADOL HCL 50 MG PO TABS
50.00 | ORAL_TABLET | ORAL | Status: DC
Start: ? — End: 2018-01-28

## 2018-01-28 MED ORDER — ATORVASTATIN CALCIUM 40 MG PO TABS
80.00 | ORAL_TABLET | ORAL | Status: DC
Start: 2018-01-28 — End: 2018-01-28

## 2018-01-28 MED ORDER — CLOPIDOGREL BISULFATE 75 MG PO TABS
75.00 | ORAL_TABLET | ORAL | Status: DC
Start: 2018-01-29 — End: 2018-01-28

## 2018-01-28 MED ORDER — ALLOPURINOL 100 MG PO TABS
100.00 | ORAL_TABLET | ORAL | Status: DC
Start: 2018-01-29 — End: 2018-01-28

## 2018-01-28 MED ORDER — TIZANIDINE HCL 4 MG PO TABS
4.00 | ORAL_TABLET | ORAL | Status: DC
Start: 2018-01-28 — End: 2018-01-28

## 2018-01-28 MED ORDER — ASPIRIN EC 81 MG PO TBEC
81.00 | DELAYED_RELEASE_TABLET | ORAL | Status: DC
Start: 2018-01-29 — End: 2018-01-28

## 2018-01-28 MED ORDER — ONDANSETRON HCL 4 MG/2ML IJ SOLN
4.00 | INTRAMUSCULAR | Status: DC
Start: ? — End: 2018-01-28

## 2018-01-28 MED ORDER — METOPROLOL TARTRATE 25 MG PO TABS
12.50 | ORAL_TABLET | ORAL | Status: DC
Start: 2018-01-28 — End: 2018-01-28

## 2018-02-05 ENCOUNTER — Telehealth: Payer: Self-pay | Admitting: Cardiovascular Disease

## 2018-02-05 ENCOUNTER — Ambulatory Visit: Payer: Medicare Other

## 2018-02-05 NOTE — Telephone Encounter (Signed)
New Message:     Patient states she had a heart attack 01/25/18, she has questions to what is the next step should be

## 2018-02-05 NOTE — Telephone Encounter (Signed)
Returned call to patient.I spoke to DOD [facility] he advised not to make any changes right now,continue medications as told,continue to monitor B/P.Advised to keep appointment with Luke,bring all medications and B/P readings.

## 2018-02-05 NOTE — Telephone Encounter (Signed)
Returned call to patient she stated she had a MI 8/25.She went to [facility] and was transported to [place] she had 2 stents,medications decreased due to low B/P.Stated her B/P is going back up ranging 135/70,140/70,162/89,165/87,133/75,136/78.She has not checked her pulse.Stated Amlodipine was stopped,Metoprolol changed to 25 mg 1/2 tablet twice a day,Atorvastatin increased to 80 mg daily.Advised I will speak to DOD Dr.Kelly and call you back.Post hospital appointment scheduled with Kerin Ransom PA 02/11/18 at 11:00 am.

## 2018-02-10 ENCOUNTER — Inpatient Hospital Stay: Payer: Medicare Other

## 2018-02-10 ENCOUNTER — Inpatient Hospital Stay: Payer: Medicare Other | Attending: Gynecology | Admitting: Gynecology

## 2018-02-10 ENCOUNTER — Encounter: Payer: Self-pay | Admitting: Gynecology

## 2018-02-10 VITALS — BP 135/91 | HR 68 | Temp 97.7°F | Resp 20 | Ht 63.0 in | Wt 146.6 lb

## 2018-02-10 DIAGNOSIS — D3911 Neoplasm of uncertain behavior of right ovary: Secondary | ICD-10-CM

## 2018-02-10 DIAGNOSIS — Z90722 Acquired absence of ovaries, bilateral: Secondary | ICD-10-CM | POA: Diagnosis not present

## 2018-02-10 DIAGNOSIS — F17210 Nicotine dependence, cigarettes, uncomplicated: Secondary | ICD-10-CM | POA: Diagnosis not present

## 2018-02-10 DIAGNOSIS — Z90710 Acquired absence of both cervix and uterus: Secondary | ICD-10-CM | POA: Insufficient documentation

## 2018-02-10 NOTE — Patient Instructions (Addendum)
We will contact you with the results of your tumor markers drawn today.  Plan to follow up in six months or sooner if needed.  Please call our office at [phone] in November or Dec 2019 to schedule for March 2019 or sooner if needed.

## 2018-02-10 NOTE — Progress Notes (Signed)
Consult Note: Gyn-Onc   Tammy Brooks 84 y.o. female  Chief Complaint  Patient presents with  . 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 Ventral hernia at the lower pole of her incision (relatively asymptomatic).  Plan: Inhibin B and AMH are obtained today.. She return to see me in 6 months and we will repeat tumor markers at each visit.  Interval history: Patient returns today as previously scheduled.  In August the patient had a myocardial infarction requiring the placement of 2 stents.  She seems to be recovering well.  Overall she's doing well as she notes that her hernia is getting larger but remains relatively asymptomatic. She's had no evidence of bowel obstruction. She has seen a general surgeon who encouraged her to discontinue smoking before considering repair of the ventral hernia. She denies any pain or other symptoms associated with the hernia. Overall her functional status is good.. She denies any GI or GU symptoms and has no pelvic pain or pressure.  She will be following up with Dr. Donnetta Hutching regarding her aortic aneurysm.  CT scan is scheduled. HPI:  84 year old white female was initially seen in consultation at the request of Dr. Aloha Gell for evaluation of a newly diagnosed complex right pelvic mass.  The patient underwent a CT scan to assess an abdominal aortic aneurysm on 03/07/2014. An incidental finding was made of a 9 cm solid and cystic right adnexal mass. CT did not show any evidence of adenopathy, ascites, or any other evidence of metastatic disease. CA-125 value is 8.1 units per mL. The patient has no past gynecologic history except for undergoing a vaginal hysterectomy and previous tubal ligation. She denies any family history of ovarian cancer.  She does have significant medical comorbidities especially vascular. The aortic aneurysm measures 4.1 x 4.3 cm with extensive mural thrombus  throughout the aneurysm.  Patient underwent exploratory laparotomy and bilateral salpingo-oophorectomy on 04/26/2014. 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 She had an uncomplicated  postoperative course.  The patient was followed with serial inhibin B and AMH.  Review of Systems:10 point review of systems is negative except as noted in interval history.   Vitals: Blood pressure (!) 135/91, pulse 68, temperature 97.7 F (36.5 C), temperature source Oral, resp. rate 20, height 5\' 3"  (1.6 m), weight 146 lb 9.6 oz (66.5 kg), SpO2 99 %.  Physical Exam: General : The patient is a healthy woman in no acute distress.  HEENT: normocephalic, extraoccular movements normal; neck is supple without thyromegally  Lynphnodes: Supraclavicular and inguinal nodes not enlarged  Abdomen: Soft, non-tender, no ascites, no organomegally, no masses, no hernias, midline incision is well-healed. There is an easily reducible ventral hernia the lower aspect of the midline incision. Pelvic:  EGBUS: Normal female  Vagina: Normal, no lesions  Urethra and Bladder: Normal, non-tender  Cervix: Surgically absent  Uterus: Surgically absent  Bi-manual examination: No masses  Rectal: normal sphincter tone, no masses, no blood  Lower extremities: No edema or varicosities. Normal range of motion      Allergies  Allergen Reactions  . Nabumetone Anaphylaxis and Other (See Comments)    Causes inflammation    Past Medical History:  Diagnosis Date  . Abdominal aortic aneurysm ([place])    followed by Dr. Sherren Mocha Early  . Adenomatous colon polyp 1998  . Arthritis   . Blind left eye   .  CAD (coronary artery disease) 08/1995   50-55% second diag dz.; occluded OM  . Complication of anesthesia    "Blood pressure went down" with retinal surgery - no problems with previous surgeries  . Echocardiogram abnormal 03/2012   mild to mod AS, valve area1.58 cm2, mild concentric LVH, grade I diastolic  dusfunction  . Gout   . Heart murmur   . Hyperlipidemia   . Hypertension   . MI (myocardial infarction) ([place]) 01/25/2018  . Myocardial infarction [facility]    mild/ age 16  . PAD (peripheral artery disease) (HCC)    mild to mod Rt ICA stenosis followed by doppler-02/05/12  . Pelvic mass   . Stroke [facility]) 2001   no residual problems  . Tobacco abuse   . Ulcer     Past Surgical History:  Procedure Laterality Date  . ABDOMINAL HYSTERECTOMY    . APPENDECTOMY    . CARDIAC CATHETERIZATION  08/1995   50-55% second diag lesion, occluded OM  . CATARACT EXTRACTION W/ INTRAOCULAR LENS  IMPLANT, BILATERAL    . COLONOSCOPY    . EYE SURGERY     lense implant removed/ blind in left eye  . LAPAROTOMY N/A 04/26/2014   Procedure: EXPLORATORY LAPAROTOMY/RESECTION OF PELVIC MASS;  Surgeon: Alvino Chapel, MD;  Location: [facility];  Service: Gynecology;  Laterality: N/A;  . OOPHORECTOMY     and fallopian tube  . POLYPECTOMY    . RETINAL DETACHMENT SURGERY  2006  . TONSILLECTOMY AND ADENOIDECTOMY    . TUBAL LIGATION    . UPPER GASTROINTESTINAL ENDOSCOPY     treated for h-pylori    Current Outpatient Medications  Medication Sig Dispense Refill  . allopurinol (ZYLOPRIM) 100 MG tablet Take 1 tablet (100 mg total) by mouth daily. 90 tablet 3  . aspirin EC 81 MG tablet Take 81 mg by mouth daily.    Marland Kitchen atorvastatin (LIPITOR) 20 MG tablet Take 80 mg by mouth daily.     . cholecalciferol (VITAMIN D) 1000 units tablet Take 1,000 Units by mouth daily.    . clopidogrel (PLAVIX) 75 MG tablet Take 1 tablet (75 mg total) by mouth daily. 90 tablet 3  . colchicine 0.6 MG tablet Take 1 tablet (0.6 mg total) by mouth 4 (four) times daily as needed. 360 tablet 2  . furosemide (LASIX) 20 MG tablet TAKE 1-2 TABLETS BY MOUTH  DAILY AS NEEDED FOR EDEMA. 180 tablet 1  . levothyroxine (SYNTHROID, LEVOTHROID) 50 MCG tablet Take 1 tablet (50 mcg total) by mouth daily. 90 tablet 3  . metoprolol tartrate (LOPRESSOR) 25  MG tablet Take 12.5 mg by mouth 2 (two) times daily.     . naproxen sodium (ALEVE) 220 MG tablet Take 440 mg by mouth as needed (back pain).    Marland Kitchen OVER THE COUNTER MEDICATION Take 1 capsule by mouth daily. Eye vitamin    . Pitavastatin Calcium 2 MG TABS Take 1 tablet (2 mg total) by mouth daily. 30 tablet 11  . telmisartan (MICARDIS) 40 MG tablet Take 1 tablet (40 mg total) by mouth daily. (Patient taking differently: Take 40 mg by mouth as needed. ) 90 tablet 3  . tiZANidine (ZANAFLEX) 4 MG tablet Take 1 tablet (4 mg total) by mouth every 8 (eight) hours as needed for muscle spasms. 90 tablet 2  . traMADol (ULTRAM) 50 MG tablet Take 1 tablet (50 mg total) by mouth every 8 (eight) hours as needed. 100 tablet 2  . amLODipine (NORVASC) 10 MG tablet  Take 1 tablet (10 mg total) by mouth daily. (Patient not taking: Reported on 02/10/2018) 90 tablet 3  . dorzolamide-timolol (COSOPT) 22.3-6.8 MG/ML ophthalmic solution Place 1 drop into the left eye daily. (Patient not taking: Reported on 02/10/2018) 10 mL 3  . hydrochlorothiazide (MICROZIDE) 12.5 MG capsule Take 1 capsule (12.5 mg total) by mouth daily. (Patient not taking: Reported on 02/10/2018) 90 capsule 3  . nitroGLYCERIN (NITROSTAT) 0.4 MG SL tablet Place 1 tablet (0.4 mg total) under the tongue every 5 (five) minutes as needed for chest pain. 25 tablet 2   No current facility-administered medications for this visit.     Social History   Socioeconomic History  . Marital status: Widowed    Spouse name: Not on file  . Number of children: 5  . Years of education: 9  . Highest education level: 9th grade  Occupational History  . Occupation: retired    Comment: retired  Scientific laboratory technician  . Financial resource strain: Not on file  . Food insecurity:    Worry: Not on file    Inability: Not on file  . Transportation needs:    Medical: Not on file    Non-medical: Not on file  Tobacco Use  . Smoking status: Current Every Day Smoker    Packs/day: 1.00     Years: 62.00    Pack years: 62.00    Types: Cigarettes  . Smokeless tobacco: Never Used  Substance and Sexual Activity  . Alcohol use: Yes    Alcohol/week: 0.0 standard drinks    Comment: occasionally - few drinks/month  . Drug use: No  . Sexual activity: Never    Partners: Male    Birth control/protection: None, Surgical  Lifestyle  . Physical activity:    Days per week: Not on file    Minutes per session: Not on file  . Stress: Not on file  Relationships  . Social connections:    Talks on phone: Not on file    Gets together: Not on file    Attends religious service: Not on file    Active member of club or organization: Not on file    Attends meetings of clubs or organizations: Not on file    Relationship status: Not on file  . Intimate partner violence:    Fear of current or ex partner: Not on file    Emotionally abused: Not on file    Physically abused: Not on file    Forced sexual activity: Not on file  Other Topics Concern  . Not on file  Social History Narrative   9th grade. married '54- widowed Dec 10th'08. 4 sons - '55, '58, '60, '69; 1 daughter - '63; 12 grandchildren; 9 great-grandchildren. 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 Other children are close and attentive. ACP/End-of-life: No CPR, no mechanical ventilation, no futile/heroic measures.    Family History  Problem Relation Age of Onset  . Aneurysm Mother        AAA  . Hypertension Mother   . Hyperlipidemia Mother   . Heart disease Father        CAD, MI x 3  . Heart attack Father   . Cancer Maternal Aunt        breast cancer  . Diabetes Sister   . Hyperlipidemia Sister   . Hypertension Sister   . Colon cancer Neg Hx   . Stomach cancer Neg Hx  Marti Sleigh, MD 02/10/2018, 11:03 AM

## 2018-02-11 ENCOUNTER — Encounter: Payer: Self-pay | Admitting: Cardiology

## 2018-02-11 ENCOUNTER — Ambulatory Visit: Payer: Medicare Other | Admitting: Cardiology

## 2018-02-11 VITALS — BP 138/62 | HR 74 | Ht 63.0 in | Wt 147.4 lb

## 2018-02-11 DIAGNOSIS — I2121 ST elevation (STEMI) myocardial infarction involving left circumflex coronary artery: Secondary | ICD-10-CM | POA: Diagnosis not present

## 2018-02-11 DIAGNOSIS — Z9861 Coronary angioplasty status: Secondary | ICD-10-CM

## 2018-02-11 DIAGNOSIS — I252 Old myocardial infarction: Secondary | ICD-10-CM | POA: Insufficient documentation

## 2018-02-11 DIAGNOSIS — I714 Abdominal aortic aneurysm, without rupture, unspecified: Secondary | ICD-10-CM

## 2018-02-11 DIAGNOSIS — I350 Nonrheumatic aortic (valve) stenosis: Secondary | ICD-10-CM | POA: Diagnosis not present

## 2018-02-11 DIAGNOSIS — I10 Essential (primary) hypertension: Secondary | ICD-10-CM

## 2018-02-11 DIAGNOSIS — I2510 Atherosclerotic heart disease of native coronary artery without angina pectoris: Secondary | ICD-10-CM | POA: Diagnosis not present

## 2018-02-11 MED ORDER — ISOSORBIDE MONONITRATE ER 30 MG PO TB24: 30.0000 mg | ORAL_TABLET | Freq: Every day | ORAL | 6 refills | Status: DC

## 2018-02-11 NOTE — Assessment & Plan Note (Signed)
Pt had STEMI 01/25/18-went to [facility], then transferred to [facility] and had PCI

## 2018-02-11 NOTE — Assessment & Plan Note (Signed)
Followed by Dr Donnetta Hutching, apparently not a stent graft candidate. CT scan Q 6 months

## 2018-02-11 NOTE — Progress Notes (Signed)
02/11/2018 Tammy Brooks   1933-09-13  [id number]  Primary Physician Plotnikov, Evie Lacks, MD Primary Cardiologist: [name]  HPI:  84 y/o female, lives at home with her son, followed by [name] with a history of remote MI-cath in '99 showed mild CAD, Myoview 2015 was low risk. She has moderate AS with normal LVF by echo Aug 2018. She has PVD with mild carotid disease and a 4.9 sm AAA by [place] Jan 2019. She has COPD and smokes a pack a day. Dr early has noted an increase in her AAA and is following her Q 6 months now. Apparently she is not a candidate for a stent graft.   The pt is in the office today for post hospital follow up. On 01/25/18 she presented to [facility] with chest pain and was diagnosed with a posterior STEMI. She was transferred to [facility] and underwent cath and PCI by Dr Wyline Copas. She had a mCFX and an OM1 stent placed. She had moderate RCA and Dx disease and initially an EF of 40-45% but was normal by post PCI echo. Her course was complicated by transient hypotension and NSVT. Her medications were adjusted. She is seen today in follow up. She had one episode of SSCP at rest 4 days ago and took a SL NTG. She has had no further symptoms.    Current Outpatient Medications  Medication Sig Dispense Refill  . allopurinol (ZYLOPRIM) 100 MG tablet Take 1 tablet (100 mg total) by mouth daily. 90 tablet 3  . aspirin EC 81 MG tablet Take 81 mg by mouth daily.    Marland Kitchen atorvastatin (LIPITOR) 80 MG tablet Take 80 mg by mouth daily.     . cholecalciferol (VITAMIN D) 1000 units tablet Take 1,000 Units by mouth daily.    . clopidogrel (PLAVIX) 75 MG tablet Take 1 tablet (75 mg total) by mouth daily. 90 tablet 3  . colchicine 0.6 MG tablet Take 1 tablet (0.6 mg total) by mouth 4 (four) times daily as needed. 360 tablet 2  . furosemide (LASIX) 20 MG tablet TAKE 1-2 TABLETS BY MOUTH  DAILY AS NEEDED FOR EDEMA. 180 tablet 1  . hydrochlorothiazide (MICROZIDE) 12.5 MG capsule Take 1  capsule (12.5 mg total) by mouth daily. 90 capsule 3  . levothyroxine (SYNTHROID, LEVOTHROID) 50 MCG tablet Take 1 tablet (50 mcg total) by mouth daily. 90 tablet 3  . metoprolol tartrate (LOPRESSOR) 25 MG tablet Take 12.5 mg by mouth 2 (two) times daily.     . naproxen sodium (ALEVE) 220 MG tablet Take 440 mg by mouth as needed (back pain).    . nitroGLYCERIN (NITROSTAT) 0.4 MG SL tablet Place 0.4 mg under the tongue every 5 (five) minutes as needed for chest pain.    Marland Kitchen OVER THE COUNTER MEDICATION Take 1 capsule by mouth daily. Eye vitamin    . Pitavastatin Calcium 2 MG TABS Take 1 tablet (2 mg total) by mouth daily. 30 tablet 11  . tiZANidine (ZANAFLEX) 4 MG tablet Take 1 tablet (4 mg total) by mouth every 8 (eight) hours as needed for muscle spasms. 90 tablet 2  . traMADol (ULTRAM) 50 MG tablet Take 1 tablet (50 mg total) by mouth every 8 (eight) hours as needed. 100 tablet 2  . isosorbide mononitrate (IMDUR) 30 MG 24 hr tablet Take 1 tablet (30 mg total) by mouth daily. 30 tablet 6   No current facility-administered medications for this visit.     Allergies  Allergen Reactions  . Nabumetone Anaphylaxis and Other (See Comments)    Causes inflammation    Past Medical History:  Diagnosis Date  . Abdominal aortic aneurysm ([place])    followed by Dr. Sherren Mocha Early  . Adenomatous colon polyp 1998  . Arthritis   . Blind left eye   . CAD (coronary artery disease) 08/1995   50-55% second diag dz.; occluded OM  . Complication of anesthesia    "Blood pressure went down" with retinal surgery - no problems with previous surgeries  . Echocardiogram abnormal 03/2012   mild to mod AS, valve area1.58 cm2, mild concentric LVH, grade I diastolic dusfunction  . Gout   . Heart murmur   . Hyperlipidemia   . Hypertension   . MI (myocardial infarction) ([place]) 01/25/2018  . Myocardial infarction [facility])    mild/ age 46  . PAD (peripheral artery disease) ([facility])    mild to mod Rt ICA stenosis followed by  doppler-02/05/12  . Pelvic mass   . Stroke [facility]) 2001   no residual problems  . Tobacco abuse   . Ulcer     Social History   Socioeconomic History  . Marital status: Widowed    Spouse name: Not on file  . Number of children: 5  . Years of education: 9  . Highest education level: 9th grade  Occupational History  . Occupation: retired    Comment: retired  Scientific laboratory technician  . Financial resource strain: Not on file  . Food insecurity:    Worry: Not on file    Inability: Not on file  . Transportation needs:    Medical: Not on file    Non-medical: Not on file  Tobacco Use  . Smoking status: Current Every Day Smoker    Packs/day: 1.00    Years: 62.00    Pack years: 62.00    Types: Cigarettes  . Smokeless tobacco: Never Used  Substance and Sexual Activity  . Alcohol use: Yes    Alcohol/week: 0.0 standard drinks    Comment: occasionally - few drinks/month  . Drug use: No  . Sexual activity: Never    Partners: Male    Birth control/protection: None, Surgical  Lifestyle  . Physical activity:    Days per week: Not on file    Minutes per session: Not on file  . Stress: Not on file  Relationships  . Social connections:    Talks on phone: Not on file    Gets together: Not on file    Attends religious service: Not on file    Active member of club or organization: Not on file    Attends meetings of clubs or organizations: Not on file    Relationship status: Not on file  . Intimate partner violence:    Fear of current or ex partner: Not on file    Emotionally abused: Not on file    Physically abused: Not on file    Forced sexual activity: Not on file  Other Topics Concern  . Not on file  Social History Narrative   9th grade. married '54- widowed Dec 10th'08. 4 sons - '55, '58, '60, '69; 1 daughter - '63; 12 grandchildren; 9 great-grandchildren. 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 Other children are close and attentive.  ACP/End-of-life: No CPR, no mechanical ventilation, no futile/heroic measures.     Family History  Problem Relation Age of Onset  . Aneurysm Mother  AAA  . Hypertension Mother   . Hyperlipidemia Mother   . Heart disease Father        CAD, MI x 3  . Heart attack Father   . Cancer Maternal Aunt        breast cancer  . Diabetes Sister   . Hyperlipidemia Sister   . Hypertension Sister   . Colon cancer Neg Hx   . Stomach cancer Neg Hx      Review of Systems: General: negative for chills, fever, night sweats or weight changes.  Cardiovascular: negative for chest pain, dyspnea on exertion, edema, orthopnea, palpitations, paroxysmal nocturnal dyspnea or shortness of breath Dermatological: negative for rash Respiratory: negative for cough or wheezing Urologic: negative for hematuria Abdominal: negative for nausea, vomiting, diarrhea, bright red blood per rectum, melena, or hematemesis Neurologic: negative for visual changes, syncope, or dizziness All other systems reviewed and are otherwise negative except as noted above.    Blood pressure 138/62, pulse 74, height 5\' 3"  (1.6 m), weight 147 lb 6.4 oz (66.9 kg).  General appearance: alert, cooperative and no distress Lungs: clear to auscultation bilaterally Heart: regular rate and rhythm Extremities: no edema Skin: Skin color, texture, turgor normal. No rashes or lesions Neurologic: Grossly normal  EKG NSR, PVCs, TWI 1, and AVL  ASSESSMENT AND PLAN:   STEMI (ST elevation myocardial infarction) ([place]) Pt had STEMI 01/25/18-went to [facility], then transferred to [facility] and had PCI  CAD S/P percutaneous coronary angioplasty CFX and OM1 PCI with DES 01/25/18- [place], Dr Wyline Copas  Essential hypertension Controlled  AAA (abdominal aortic aneurysm) without rupture Followed by Dr Donnetta Hutching, apparently not a stent graft candidate. CT scan Q 6 months  Moderate aortic stenosis Stable - moderate AS with normal LVF Aug  2019 at [facility]- post MI/PCI   PLAN  Add Imdur. F/U with [name] Oct 1st as scheduled.   Kerin Ransom PA-C 02/11/2018 11:32 AM

## 2018-02-11 NOTE — Assessment & Plan Note (Signed)
Controlled.  

## 2018-02-11 NOTE — Patient Instructions (Signed)
Medication Instructions: Your physician recommends that you continue on your current medications as directed. Please refer to the Current Medication list given to you today.  START Isosorbide 30 mg daily.   Testing/Procedures: Your stress test has been cancelled.  Follow-Up: Keep your follow-up appointment with Dr. Gwenlyn Found.  If you need a refill on your cardiac medications before your next appointment, please call your pharmacy.

## 2018-02-11 NOTE — Assessment & Plan Note (Signed)
Stable - moderate AS with normal LVF Aug 2019 at [facility]- post MI/PCI

## 2018-02-11 NOTE — Assessment & Plan Note (Signed)
CFX and OM1 PCI with DES 01/25/18- [place], Dr Wyline Copas

## 2018-02-12 DIAGNOSIS — Z961 Presence of intraocular lens: Secondary | ICD-10-CM | POA: Diagnosis not present

## 2018-02-12 DIAGNOSIS — H5440 Blindness, one eye, unspecified eye: Secondary | ICD-10-CM | POA: Diagnosis not present

## 2018-02-12 DIAGNOSIS — H2702 Aphakia, left eye: Secondary | ICD-10-CM | POA: Diagnosis not present

## 2018-02-12 DIAGNOSIS — H338 Other retinal detachments: Secondary | ICD-10-CM | POA: Diagnosis not present

## 2018-02-12 DIAGNOSIS — H353131 Nonexudative age-related macular degeneration, bilateral, early dry stage: Secondary | ICD-10-CM | POA: Diagnosis not present

## 2018-02-12 LAB — INHIBIN B: Inhibin B: <7 pg/mL (ref 0.0–16.9)

## 2018-02-13 ENCOUNTER — Telehealth: Payer: Self-pay | Admitting: *Deleted

## 2018-02-13 NOTE — Telephone Encounter (Signed)
I call to tell the patient her lab results.  Per Joylene John, NP the Inhibin B level was normal.  Patient was very appreciative and verbalized understanding.

## 2018-02-14 LAB — ANTI MULLERIAN HORMONE: ANTI-MULLERIAN HORMONE (AMH): <0.015 ng/mL

## 2018-02-16 ENCOUNTER — Telehealth: Payer: Self-pay

## 2018-02-16 NOTE — Telephone Encounter (Signed)
LM for patient to callback for lab results. When she returns call she will be informed that her Anti-Mullerian Hormone was WNL at <0.015 per Joylene John, NP.

## 2018-02-16 NOTE — Telephone Encounter (Signed)
Told Tammy Brooks the results of the Tumor marker as noted below. Pt verbalized understanding.

## 2018-02-20 ENCOUNTER — Inpatient Hospital Stay (HOSPITAL_COMMUNITY): Admission: RE | Admit: 2018-02-20 | Payer: Medicare Other | Source: Ambulatory Visit

## 2018-03-03 ENCOUNTER — Ambulatory Visit: Payer: Medicare Other | Admitting: Cardiovascular Disease

## 2018-03-03 ENCOUNTER — Encounter: Payer: Self-pay | Admitting: Cardiovascular Disease

## 2018-03-03 DIAGNOSIS — I2510 Atherosclerotic heart disease of native coronary artery without angina pectoris: Secondary | ICD-10-CM

## 2018-03-03 DIAGNOSIS — I350 Nonrheumatic aortic (valve) stenosis: Secondary | ICD-10-CM

## 2018-03-03 DIAGNOSIS — E785 Hyperlipidemia, unspecified: Secondary | ICD-10-CM | POA: Diagnosis not present

## 2018-03-03 DIAGNOSIS — I739 Peripheral vascular disease, unspecified: Secondary | ICD-10-CM

## 2018-03-03 DIAGNOSIS — I10 Essential (primary) hypertension: Secondary | ICD-10-CM | POA: Diagnosis not present

## 2018-03-03 DIAGNOSIS — Z8679 Personal history of other diseases of the circulatory system: Secondary | ICD-10-CM

## 2018-03-03 DIAGNOSIS — Z9861 Coronary angioplasty status: Secondary | ICD-10-CM

## 2018-03-03 NOTE — Assessment & Plan Note (Signed)
History of CAD status post posterior STEMI at [facility] status post circumflex and obtuse marginal branch stent placement by Dr. Sherrian Divers 01/25/2018 with an EF at that time of 40 to 45%.  Subsequent echo performed in July revealed EF of 60 to 65%.

## 2018-03-03 NOTE — Addendum Note (Signed)
Addended by: Newt Minion on: 03/03/2018 11:04 AM   Modules accepted: Orders

## 2018-03-03 NOTE — Assessment & Plan Note (Signed)
History of mild bilateral ICA stenosis by duplex ultrasound in 2016.  We did not need to follow this in the future.

## 2018-03-03 NOTE — Assessment & Plan Note (Signed)
She of abdominal aortic aneurysm followed by Dr. Donnetta Hutching

## 2018-03-03 NOTE — Progress Notes (Signed)
03/03/2018 Tammy Brooks   12/03/1933  [id number]  Primary Physician Plotnikov, Evie Lacks, MD Primary Cardiologist: Lorretta Harp MD FACP, [place], [place], [place]  HPI:  Tammy Brooks is a 84 y.o.  mildly overweight widowed Caucasian female mother of 77, grandmother of 36 grandchildren well last saw  12/09/2017. She has a history of mild CAD by cath in 1997. Her lipid problems include continued tobacco abuse one pack per day, hypertension, and hyperlipidemia all medically treated. She does have mild to moderate aortic stenosis with valve area of 1 cm2 by 2-D echo one year ago.. She also has moderate right internal carotid artery stenosis by duplex ultrasound. She was recently diagnosed with a small abdominal aortic and iliac aneurysm followed by Dr. Donnetta Hutching. She denies chest pain or shortness of breath. She was found to have a pelvic mass which was surgically excised last year and found to be benign. She does have a hernia as result of this. She has been seen in the emergency room once for chest pain and rule out PE.  CTA was negative for this although it did show coronary calcification consistent with atherosclerosis.  She was scheduled for a Myoview stress test which has been delayed until September because of fiscal constraints.  She does continue to smoke a pack a day.  She currently denies chest pain or shortness of breath.   Since I saw her 3 months ago a follow-up echo shown stable moderate aortic stenosis with normal LV function.  She saw Kerin Ransom in the office 02/11/2018 who began her on indoor which it has resulted in marked improvement in her chest pain.  She also has a moderate abdominal aortic aneurysm followed by Dr. Donnetta Hutching.  Current Meds  Medication Sig  . allopurinol (ZYLOPRIM) 100 MG tablet Take 1 tablet (100 mg total) by mouth daily.  Marland Kitchen aspirin EC 81 MG tablet Take 81 mg by mouth daily.  Marland Kitchen atorvastatin (LIPITOR) 80 MG tablet Take 80 mg by mouth daily.   . cholecalciferol (VITAMIN D)  1000 units tablet Take 1,000 Units by mouth daily.  . clopidogrel (PLAVIX) 75 MG tablet Take 1 tablet (75 mg total) by mouth daily.  . colchicine 0.6 MG tablet Take 1 tablet (0.6 mg total) by mouth 4 (four) times daily as needed.  . furosemide (LASIX) 20 MG tablet TAKE 1-2 TABLETS BY MOUTH  DAILY AS NEEDED FOR EDEMA.  . isosorbide mononitrate (IMDUR) 30 MG 24 hr tablet Take 1 tablet (30 mg total) by mouth daily.  Marland Kitchen levothyroxine (SYNTHROID, LEVOTHROID) 50 MCG tablet Take 1 tablet (50 mcg total) by mouth daily.  . metoprolol tartrate (LOPRESSOR) 25 MG tablet Take 12.5 mg by mouth 2 (two) times daily.   . naproxen sodium (ALEVE) 220 MG tablet Take 440 mg by mouth as needed (back pain).  . nitroGLYCERIN (NITROSTAT) 0.4 MG SL tablet Place 0.4 mg under the tongue every 5 (five) minutes as needed for chest pain.  Marland Kitchen OVER THE COUNTER MEDICATION Take 1 capsule by mouth daily. Eye vitamin  . Pitavastatin Calcium 2 MG TABS Take 1 tablet (2 mg total) by mouth daily.  Marland Kitchen tiZANidine (ZANAFLEX) 4 MG tablet Take 1 tablet (4 mg total) by mouth every 8 (eight) hours as needed for muscle spasms.  . traMADol (ULTRAM) 50 MG tablet Take 1 tablet (50 mg total) by mouth every 8 (eight) hours as needed.     Allergies  Allergen Reactions  . Nabumetone Anaphylaxis and Other (See  Comments)    Causes inflammation    Social History   Socioeconomic History  . Marital status: Widowed    Spouse name: Not on file  . Number of children: 5  . Years of education: 9  . Highest education level: 9th grade  Occupational History  . Occupation: retired    Comment: retired  Scientific laboratory technician  . Financial resource strain: Not on file  . Food insecurity:    Worry: Not on file    Inability: Not on file  . Transportation needs:    Medical: Not on file    Non-medical: Not on file  Tobacco Use  . Smoking status: Current Every Day Smoker    Packs/day: 1.00    Years: 62.00    Pack years: 62.00    Types: Cigarettes  . Smokeless  tobacco: Never Used  Substance and Sexual Activity  . Alcohol use: Yes    Alcohol/week: 0.0 standard drinks    Comment: occasionally - few drinks/month  . Drug use: No  . Sexual activity: Never    Partners: Male    Birth control/protection: None, Surgical  Lifestyle  . Physical activity:    Days per week: Not on file    Minutes per session: Not on file  . Stress: Not on file  Relationships  . Social connections:    Talks on phone: Not on file    Gets together: Not on file    Attends religious service: Not on file    Active member of club or organization: Not on file    Attends meetings of clubs or organizations: Not on file    Relationship status: Not on file  . Intimate partner violence:    Fear of current or ex partner: Not on file    Emotionally abused: Not on file    Physically abused: Not on file    Forced sexual activity: Not on file  Other Topics Concern  . Not on file  Social History Narrative   9th grade. married '54- widowed Dec 10th'08. 4 sons - '55, '58, '60, '69; 1 daughter - '63; 12 grandchildren; 9 great-grandchildren. 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 Other children are close and attentive. ACP/End-of-life: No CPR, no mechanical ventilation, no futile/heroic measures.     Review of Systems: General: negative for chills, fever, night sweats or weight changes.  Cardiovascular: negative for chest pain, dyspnea on exertion, edema, orthopnea, palpitations, paroxysmal nocturnal dyspnea or shortness of breath Dermatological: negative for rash Respiratory: negative for cough or wheezing Urologic: negative for hematuria Abdominal: negative for nausea, vomiting, diarrhea, bright red blood per rectum, melena, or hematemesis Neurologic: negative for visual changes, syncope, or dizziness All other systems reviewed and are otherwise negative except as noted above.    Blood pressure 138/78, pulse (!) 56, height 5\' 3"   (1.6 m), weight 145 lb 9.6 oz (66 kg), SpO2 95 %.  General appearance: alert and no distress Neck: no adenopathy, no carotid bruit, no JVD, supple, symmetrical, trachea midline and thyroid not enlarged, symmetric, no tenderness/mass/nodules Lungs: clear to auscultation bilaterally Heart: 2/6 outflow tract murmur consistent with aortic stenosis. Extremities: extremities normal, atraumatic, no cyanosis or edema Pulses: 2+ and symmetric Skin: Skin color, texture, turgor normal. No rashes or lesions Neurologic: Alert and oriented X 3, normal strength and tone. Normal symmetric reflexes. Normal coordination and gait  EKG not performed today  ASSESSMENT AND PLAN:   Hx of carotid artery stenosis,  RT. -moderate, followed by Dr. Gwenlyn Found, Last dopplers 02/2012 History of mild bilateral ICA stenosis by duplex ultrasound in 2016.  We did not need to follow this in the future.  Dyslipidemia History of dyslipidemia on statin therapy followed by her PCP  Essential hypertension History of essential hypertension her blood pressure measured at 138/78.  She is on metoprolol.  Continue current meds at current dosing.  Peripheral vascular disease ([place]) She of abdominal aortic aneurysm followed by Dr. Donnetta Hutching  Moderate aortic stenosis History of moderate aortic stenosis by 2D echocardiogram performed 12/16/2017 with a peak gradient of 62 mmHg and a valve area 9 6 cm.  We will continue to follow her noninvasively on an annual basis.  CAD S/P percutaneous coronary angioplasty History of CAD status post posterior STEMI at [facility] status post circumflex and obtuse marginal branch stent placement by Dr. Sherrian Divers 01/25/2018 with an EF at that time of 40 to 45%.  Subsequent echo performed in July revealed EF of 60 to 65%.      Lorretta Harp MD FACP,FACC,FAHA, [facility] 03/03/2018 10:59 AM

## 2018-03-03 NOTE — Assessment & Plan Note (Signed)
History of essential hypertension her blood pressure measured at 138/78.  She is on metoprolol.  Continue current meds at current dosing.

## 2018-03-03 NOTE — Assessment & Plan Note (Signed)
History of dyslipidemia on statin therapy followed by her PCP 

## 2018-03-03 NOTE — Assessment & Plan Note (Signed)
History of moderate aortic stenosis by 2D echocardiogram performed 12/16/2017 with a peak gradient of 62 mmHg and a valve area 9 6 cm.  We will continue to follow her noninvasively on an annual basis.

## 2018-03-03 NOTE — Patient Instructions (Signed)
Medication Instructions:  Your physician recommends that you continue on your current medications as directed. Please refer to the Current Medication list given to you today.   Labwork: none  Testing/Procedures: Your physician has requested that you have an echocardiogram. Echocardiography is a painless test that uses sound waves to create images of your heart. It provides your doctor with information about the size and shape of your heart and how well your heart's chambers and valves are working. This procedure takes approximately one hour. There are no restrictions for this procedure. SCHEDULE FOR July 2020   Follow-Up: We request that you follow-up in: 6 months with Kerin Ransom, PA and in 12 months with Dr Andria Rhein will receive a reminder letter in the mail two months in advance. If you don't receive a letter, please call our office to schedule the follow-up appointment.    Any Other Special Instructions Will Be Listed Below (If Applicable).     If you need a refill on your cardiac medications before your next appointment, please call your pharmacy.

## 2018-03-20 ENCOUNTER — Other Ambulatory Visit: Payer: Self-pay

## 2018-03-20 NOTE — Patient Outreach (Signed)
[facility]) Care Management  03/20/2018  PAOLINA KARWOWSKI 08-29-1933 [id number]   Medication Adherence call to Mrs. Javana Schey spoke with patient she is no longer taking Telmisartan 40 mg doctor took her off. Mrs. Luper is showing past due under [place].  [facility] Management Direct Dial [phone]  Fax [phone] [email or web address]

## 2018-04-02 ENCOUNTER — Encounter: Payer: Self-pay | Admitting: Internal Medicine

## 2018-04-02 ENCOUNTER — Ambulatory Visit (INDEPENDENT_AMBULATORY_CARE_PROVIDER_SITE_OTHER): Payer: Medicare Other | Admitting: Internal Medicine

## 2018-04-02 ENCOUNTER — Other Ambulatory Visit (INDEPENDENT_AMBULATORY_CARE_PROVIDER_SITE_OTHER): Payer: Medicare Other

## 2018-04-02 DIAGNOSIS — I2121 ST elevation (STEMI) myocardial infarction involving left circumflex coronary artery: Secondary | ICD-10-CM

## 2018-04-02 DIAGNOSIS — E785 Hyperlipidemia, unspecified: Secondary | ICD-10-CM

## 2018-04-02 DIAGNOSIS — K4090 Unilateral inguinal hernia, without obstruction or gangrene, not specified as recurrent: Secondary | ICD-10-CM | POA: Diagnosis not present

## 2018-04-02 DIAGNOSIS — Z9861 Coronary angioplasty status: Secondary | ICD-10-CM

## 2018-04-02 DIAGNOSIS — I2510 Atherosclerotic heart disease of native coronary artery without angina pectoris: Secondary | ICD-10-CM

## 2018-04-02 DIAGNOSIS — F17200 Nicotine dependence, unspecified, uncomplicated: Secondary | ICD-10-CM

## 2018-04-02 LAB — HEPATIC FUNCTION PANEL
ALBUMIN: 4 g/dL (ref 3.5–5.2)
ALK PHOS: 54 U/L (ref 39–117)
ALT: 12 U/L (ref 0–35)
AST: 16 U/L (ref 0–37)
Bilirubin, Direct: 0.1 mg/dL (ref 0.0–0.3)
Total Bilirubin: 0.5 mg/dL (ref 0.2–1.2)
Total Protein: 6.4 g/dL (ref 6.0–8.3)

## 2018-04-02 LAB — BASIC METABOLIC PANEL
BUN: 15 mg/dL (ref 6–23)
CALCIUM: 9 mg/dL (ref 8.4–10.5)
CHLORIDE: 110 meq/L (ref 96–112)
CO2: 29 meq/L (ref 19–32)
Creatinine, Ser: 1.1 mg/dL (ref 0.40–1.20)
GFR: 50.48 mL/min — ABNORMAL LOW (ref >60.00)
Glucose, Bld: 108 mg/dL — ABNORMAL HIGH (ref 70–99)
Potassium: 3.9 mEq/L (ref 3.5–5.1)
Sodium: 143 mEq/L (ref 135–145)

## 2018-04-02 LAB — LIPID PANEL
CHOL/HDL RATIO: 4
Cholesterol: 121 mg/dL (ref 0–200)
HDL: 31.5 mg/dL — ABNORMAL LOW (ref >39.00)
LDL CALC: 70 mg/dL (ref 0–99)
NonHDL: 89.09
TRIGLYCERIDES: 95 mg/dL (ref 0.0–149.0)
VLDL: 19 mg/dL (ref 0.0–40.0)

## 2018-04-02 MED ORDER — ATORVASTATIN CALCIUM 40 MG PO TABS: 80.0000 mg | ORAL_TABLET | Freq: Every day | ORAL | 3 refills | Status: DC

## 2018-04-02 MED ORDER — METOPROLOL TARTRATE 25 MG PO TABS: 12.5000 mg | ORAL_TABLET | Freq: Two times a day (BID) | ORAL | 3 refills | Status: DC

## 2018-04-02 NOTE — Assessment & Plan Note (Signed)
Lipitor 80 mg/d

## 2018-04-02 NOTE — Progress Notes (Signed)
Subjective:  Patient ID: Tammy Brooks, female    DOB: March 03, 1934  Age: 84 y.o. MRN: [id number]  CC: No chief complaint on file.   HPI Tammy Brooks presents for CAD - post-MI and 2 STENTs in 8/19 ([facility], Dr Sherrian Divers) - no CP. The pt f/u w/[name]. Lipitor 80 mg pills are too big  Outpatient Medications Prior to Visit  Medication Sig Dispense Refill  . allopurinol (ZYLOPRIM) 100 MG tablet Take 1 tablet (100 mg total) by mouth daily. 90 tablet 3  . aspirin EC 81 MG tablet Take 81 mg by mouth daily.    Marland Kitchen atorvastatin (LIPITOR) 80 MG tablet Take 80 mg by mouth daily.     . cholecalciferol (VITAMIN D) 1000 units tablet Take 1,000 Units by mouth daily.    . clopidogrel (PLAVIX) 75 MG tablet Take 1 tablet (75 mg total) by mouth daily. 90 tablet 3  . colchicine 0.6 MG tablet Take 1 tablet (0.6 mg total) by mouth 4 (four) times daily as needed. 360 tablet 2  . furosemide (LASIX) 20 MG tablet TAKE 1-2 TABLETS BY MOUTH  DAILY AS NEEDED FOR EDEMA. 180 tablet 1  . isosorbide mononitrate (IMDUR) 30 MG 24 hr tablet Take 1 tablet (30 mg total) by mouth daily. 30 tablet 6  . levothyroxine (SYNTHROID, LEVOTHROID) 50 MCG tablet Take 1 tablet (50 mcg total) by mouth daily. 90 tablet 3  . metoprolol tartrate (LOPRESSOR) 25 MG tablet Take 12.5 mg by mouth 2 (two) times daily.     . naproxen sodium (ALEVE) 220 MG tablet Take 440 mg by mouth as needed (back pain).    . nitroGLYCERIN (NITROSTAT) 0.4 MG SL tablet Place 0.4 mg under the tongue every 5 (five) minutes as needed for chest pain.    Marland Kitchen OVER THE COUNTER MEDICATION Take 1 capsule by mouth daily. Eye vitamin    . Pitavastatin Calcium 2 MG TABS Take 1 tablet (2 mg total) by mouth daily. 30 tablet 11  . tiZANidine (ZANAFLEX) 4 MG tablet Take 1 tablet (4 mg total) by mouth every 8 (eight) hours as needed for muscle spasms. 90 tablet 2  . traMADol (ULTRAM) 50 MG tablet Take 1 tablet (50 mg total) by mouth every 8 (eight) hours as needed. 100 tablet 2   No  facility-administered medications prior to visit.     ROS: Review of Systems  Constitutional: Negative for activity change, appetite change, chills, fatigue and unexpected weight change.  HENT: Negative for congestion, mouth sores and sinus pressure.   Eyes: Negative for visual disturbance.  Respiratory: Negative for cough and chest tightness.   Cardiovascular: Negative for chest pain and palpitations.  Gastrointestinal: Negative for abdominal pain and nausea.  Genitourinary: Negative for difficulty urinating, frequency and vaginal pain.  Musculoskeletal: Negative for back pain and gait problem.  Skin: Negative for pallor and rash.  Neurological: Negative for dizziness, tremors, weakness, numbness and headaches.  Psychiatric/Behavioral: Negative for confusion, sleep disturbance and suicidal ideas. The patient is nervous/anxious.     Objective:  BP 134/82 (BP Location: Left Arm, Patient Position: Sitting, Cuff Size: Normal)   Pulse 64   Temp 98 F (36.7 C) (Oral)   Ht 5\' 3"  (1.6 m)   Wt 147 lb (66.7 kg)   SpO2 96%   BMI 26.04 kg/m   BP Readings from Last 3 Encounters:  04/02/18 134/82  03/03/18 138/78  02/11/18 138/62    Wt Readings from Last 3 Encounters:  04/02/18 147 lb (  66.7 kg)  03/03/18 145 lb 9.6 oz (66 kg)  02/11/18 147 lb 6.4 oz (66.9 kg)    Physical Exam  Constitutional: She appears well-developed. No distress.  HENT:  Head: Normocephalic.  Right Ear: External ear normal.  Left Ear: External ear normal.  Nose: Nose normal.  Mouth/Throat: Oropharynx is clear and moist.  Eyes: Pupils are equal, round, and reactive to light. Conjunctivae are normal. Right eye exhibits no discharge. Left eye exhibits no discharge.  Neck: Normal range of motion. Neck supple. No JVD present. No tracheal deviation present. No thyromegaly present.  Cardiovascular: Normal rate, regular rhythm and normal heart sounds.  Pulmonary/Chest: No stridor. No respiratory distress. She has no  wheezes.  Abdominal: Soft. Bowel sounds are normal. She exhibits no distension and no mass. There is no tenderness. There is no rebound and no guarding.  Musculoskeletal: She exhibits no edema or tenderness.  Lymphadenopathy:    She has no cervical adenopathy.  Neurological: She displays normal reflexes. No cranial nerve deficit. She exhibits normal muscle tone. Coordination normal.  Skin: No rash noted. No erythema.  Psychiatric: She has a normal mood and affect. Her behavior is normal. Judgment and thought content normal.  AKs L ing hernia  Lab Results  Component Value Date   WBC 8.8 10/15/2017   HGB 14.9 10/15/2017   HCT 45.5 10/15/2017   PLT 235 10/15/2017   GLUCOSE 78 10/15/2017   CHOL 143 02/01/2016   TRIG 141.0 02/01/2016   HDL 31.60 (L) 02/01/2016   LDLDIRECT 76.7 11/17/2008   LDLCALC 83 02/01/2016   ALT 13 (L) 10/15/2017   AST 21 10/15/2017   NA 142 10/15/2017   K 4.0 10/15/2017   CL 108 10/15/2017   CREATININE 0.98 10/15/2017   BUN 19 10/15/2017   CO2 26 10/15/2017   TSH 5.54 (H) 08/21/2017    Ct Angio Abdomen Pelvis  W &/or Wo Contrast  Result Date: 01/06/2018 CLINICAL DATA:  84 year old female with a history abdominal aortic aneurysm EXAM: CTA ABDOMEN AND PELVIS wITHOUT AND WITH CONTRAST TECHNIQUE: Multidetector CT imaging of the abdomen and pelvis was performed using the standard protocol during bolus administration of intravenous contrast. Multiplanar reconstructed images and MIPs were obtained and reviewed to evaluate the vascular anatomy. CONTRAST:  40mL ISOVUE-370 IOPAMIDOL (ISOVUE-370) INJECTION 76% COMPARISON:  12/16/2016, 03/07/2014 FINDINGS: VASCULAR Aorta: Moderate atherosclerotic changes of the visualized thoracic aorta. Diameter of the aorta at the hiatus measures 2.4 cm. Moderate to advanced atherosclerotic changes of the abdominal aorta. Infrarenal abdominal aortic aneurysm again demonstrated. On axial images the lowest renal artery is the right renal  artery, with the diameter of the aorta measuring 2.6 cm just below the right renal artery origin. Essentially no neck, with fusiform dilation of the aorta. Greatest diameter on the parasagittal images measures 5.0 cm (image 123 of series 12). Measurement made on the prior CT at this location was 4.6 cm, with slight increase though less than 5 mm. Greatest diameter on the axial images is measured on image 64 of series 7. Diameter at this location measures 7.1 cm, however, this appears to overestimate the actual diameter size perpendicular to the flow channel. There is essentially circumferential thrombus/plaque throughout the length of the aneurysm with the flow channel maintained throughout. Aneurysm extends into the left common iliac artery. No periaortic inflammation or fluid. Celiac: Atherosclerotic changes at the origin of the celiac artery which remains patent. Accessory left hepatic artery. SMA: Atherosclerotic changes at the origin of the superior mesenteric artery  with patent branches. Renals: Right renal artery is the lowest renal artery. Atherosclerotic changes at the origin the right renal artery without significant stenosis. Mixed calcified and soft plaque at the origin of the left renal artery with estimated 50% narrowing by CT on the axial images. IMA: Inferior mesenteric artery is occluded at the origin. Collateral flow fills the IMA Right lower extremity: Moderate atherosclerotic changes of the right iliac system. Hypogastric artery is patent. External iliac artery patent with moderate atherosclerotic changes. Common femoral artery patent with mild atherosclerotic changes. Proximal profunda femoris and SFA patent. Left lower extremity: Aneurysm extends from the aorta into the left common iliac artery with the greatest diameter measuring 2.9 cm. Moderate atherosclerotic changes of the distal common iliac artery. Hypogastric artery is patent though likely stenotic at the origin. External iliac artery  patent with mild atherosclerotic changes. Common femoral artery patent with mild atherosclerotic changes. Proximal profunda femoris and SFA patent. Veins: Unremarkable appearance of the venous system. Review of the MIP images confirms the above findings. NON-VASCULAR Lower chest: Emphysema of the visualized lung bases. 5 mm nodule at the periphery of the left lung base Hepatobiliary: Unremarkable appearance of the liver. Unremarkable gall bladder. Pancreas: Unremarkable appearance of the pancreas. No pericholecystic fluid or inflammatory changes. Unremarkable ductal system. Spleen: Unremarkable. Adrenals/Urinary Tract: Unchanged appearance of left adrenal nodule measuring 18 mm. Characteristics on prior CT compatible with adrenal adenoma. Unremarkable right adrenal gland. Right: No hydronephrosis. Symmetric perfusion to the left. No nephrolithiasis. Unremarkable course of the right ureter. Left: No hydronephrosis. Symmetric perfusion to the right. No nephrolithiasis. Unremarkable course of the left ureter. Bosniak 1 cyst of the lower left kidney. Unremarkable appearance of the urinary bladder . Stomach/Bowel: Unremarkable appearance of the stomach. Unremarkable appearance of small bowel. No evidence of obstruction. Colonic diverticula, with no associated inflammation. Appendix is not visualized, however, no inflammatory changes are present adjacent to the cecum to indicate an appendicitis. Lymphatic: No lymphadenopathy Mesenteric: No free fluid or air. No adenopathy. Reproductive: Hysterectomy Other: Ventral wall hernia of the lower abdomen/pelvis containing mesentery and loops of small bowel. No evidence of associated obstruction. Musculoskeletal: No acute displaced fracture. Grade 1 anterolisthesis of L4 on L5. Multilevel degenerative changes of the thoracolumbar spine. IMPRESSION: No acute CT finding. Redemonstration of infrarenal abdominal aorta with complex neck anatomy and circumferential mural thrombus.  Diameter which is favored to be the most representative of the aneurysm is again measured on the parasagittal images, estimated 5.0 cm on the current CT, which is slightly greater than the previous CT of 4.6 cm. Aortic aneurysm NOS (ICD10-I71.9). 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 Renal artery atherosclerosis, likely greatest on the left where there may be 50% narrowing. Aortic and bilateral iliac atherosclerosis. Aortic Atherosclerosis (ICD10-I70.0). 5 mm nodule at the left lung base. Non-contrast chest CT is recommended in 12 months given patient's apparent risk factors. This recommendation follows the consensus statement: Guidelines for Management of Incidental Pulmonary Nodules Detected on CT Images: From the Fleischner Society 2017; Radiology 2017; 284:228-243. Left adrenal adenoma. Diverticular disease. Signed, Dulcy Fanny. Dellia Nims, RPVI [facility] Radiology Electronically Signed   By: Corrie Mckusick D.O.   On: 01/06/2018 10:59    Assessment & Plan:   There are no diagnoses linked to this encounter.   No orders of the defined types were placed in this encounter.    Follow-up: No follow-ups on file.  Walker Kehr, MD

## 2018-04-02 NOTE — Assessment & Plan Note (Signed)
CAD S/P percutaneous coronary angioplasty History of CAD status post posterior STEMI at [facility] status post circumflex and obtuse marginal branch stent placement by Dr. Sherrian Divers 01/25/2018 with an EF at that time of 40 to 45%.  Subsequent echo performed in July revealed EF of 60 to 65%.  Lipitor 80 mg/d

## 2018-04-02 NOTE — Assessment & Plan Note (Signed)
surg f/u discussed

## 2018-04-02 NOTE — Assessment & Plan Note (Signed)
Re-started again Discussed

## 2018-04-02 NOTE — Assessment & Plan Note (Signed)
CAD S/P percutaneous coronary angioplasty History of CAD status post posterior STEMI at [facility] status post circumflex and obtuse marginal branch stent placement by Dr. Sherrian Divers 01/25/2018 with an EF at that time of 40 to 45%.  Subsequent echo performed in July revealed EF of 60 to 65%.

## 2018-04-03 ENCOUNTER — Telehealth: Payer: Self-pay

## 2018-04-03 NOTE — Telephone Encounter (Signed)
Key: [id number]

## 2018-05-05 ENCOUNTER — Other Ambulatory Visit: Payer: Self-pay

## 2018-05-05 MED ORDER — ISOSORBIDE MONONITRATE ER 30 MG PO TB24: 30.0000 mg | ORAL_TABLET | Freq: Every day | ORAL | 2 refills | Status: DC

## 2018-06-07 ENCOUNTER — Encounter: Payer: Self-pay | Admitting: Cardiology

## 2018-06-08 ENCOUNTER — Ambulatory Visit (INDEPENDENT_AMBULATORY_CARE_PROVIDER_SITE_OTHER): Payer: Medicare Other | Admitting: Cardiology

## 2018-06-08 ENCOUNTER — Encounter: Payer: Self-pay | Admitting: Cardiology

## 2018-06-08 VITALS — BP 170/83 | HR 61 | Ht 63.0 in | Wt 143.8 lb

## 2018-06-08 DIAGNOSIS — I714 Abdominal aortic aneurysm, without rupture, unspecified: Secondary | ICD-10-CM

## 2018-06-08 DIAGNOSIS — I10 Essential (primary) hypertension: Secondary | ICD-10-CM | POA: Diagnosis not present

## 2018-06-08 DIAGNOSIS — Z9861 Coronary angioplasty status: Secondary | ICD-10-CM

## 2018-06-08 DIAGNOSIS — I350 Nonrheumatic aortic (valve) stenosis: Secondary | ICD-10-CM

## 2018-06-08 DIAGNOSIS — I2510 Atherosclerotic heart disease of native coronary artery without angina pectoris: Secondary | ICD-10-CM

## 2018-06-08 DIAGNOSIS — Z8679 Personal history of other diseases of the circulatory system: Secondary | ICD-10-CM | POA: Diagnosis not present

## 2018-06-08 MED ORDER — LOSARTAN POTASSIUM 25 MG PO TABS: 25.0000 mg | ORAL_TABLET | Freq: Every day | ORAL | 11 refills | Status: DC

## 2018-06-08 MED ORDER — AMLODIPINE BESYLATE 5 MG PO TABS: 5.0000 mg | ORAL_TABLET | Freq: Every day | ORAL | 11 refills | Status: DC

## 2018-06-08 NOTE — Patient Instructions (Signed)
Medication Instructions:  START Norvasc 5mg  Take 1 tablet once a day START Losartan 25mg  Take 1 tablet once a day If you need a refill on your cardiac medications before your next appointment, please call your pharmacy.   Lab work: None  If you have labs (blood work) drawn today and your tests are completely normal, you will receive your results only by: Marland Kitchen MyChart Message (if you have MyChart) OR . A paper copy in the mail If you have any lab test that is abnormal or we need to change your treatment, we will call you to review the results.  Testing/Procedures: None  Follow-Up: At [facility], you and your health needs are our priority.  As part of our continuing mission to provide you with exceptional heart care, we have created designated Provider Care Teams.  These Care Teams include your primary Cardiologist (physician) and Advanced Practice Providers (APPs -  Physician Assistants and Nurse Practitioners) who all work together to provide you with the care you need, when you need it. . Your physician recommends that you schedule a follow-up appointment in: 2-3 weeks with Kerin Ransom, PA-C  Any Other Special Instructions Will Be Listed Below (If Applicable).

## 2018-06-08 NOTE — Progress Notes (Addendum)
06/08/2018 Tammy Brooks   1934-05-10  [id number]  Primary Physician Plotnikov, Evie Lacks, MD Primary Cardiologist: [name]  HPI:  Pleasant 85 y/o female, lives at home with her son, followed by [name] with a history of remote MI-cath in '99 showed mild CAD, Myoview 2015 was low risk. She has severe AS with normal LVF by echo July 2019. She has PVD with mild carotid disease and a 4.9 sm AAA by [place] Jan 2019. She has COPD and smokes 4 cigarettes a day.  Dr early has noted an increase in her AAA and is following her Q 6 months now. Apparently she is not a candidate for a stent graft.   The pt was admitted 01/25/18 to [facility] with chest pain and was diagnosed with a posterior STEMI.  She was transferred to [facility] and underwent cath and PCI by Dr Wyline Copas.  She had a mCFX and an OM1 stent placed.  She had moderate RCA and Dx disease and initially an EF of 40-45% but her EF was normal by post PCI echo.  Her course was complicated by transient hypotension and NSVT.  Her medications were adjusted. She has done well since.  We added low dose nitrate for chest pain in Sept 2019 and she denies any chest pain since. She is in the office today because she has noticed her B/P has been elevated.  She was taken off her Amlodipine and telmisartan when she had her MI secondary to hypotension. The patient denies chest pain, DOE, near syncope or syncope.    Current Outpatient Medications  Medication Sig Dispense Refill  . allopurinol (ZYLOPRIM) 100 MG tablet Take 1 tablet (100 mg total) by mouth daily. 90 tablet 3  . aspirin EC 81 MG tablet Take 81 mg by mouth daily.    Marland Kitchen atorvastatin (LIPITOR) 40 MG tablet Take 2 tablets (80 mg total) by mouth daily. 180 tablet 3  . cholecalciferol (VITAMIN D) 1000 units tablet Take 1,000 Units by mouth daily.    . clopidogrel (PLAVIX) 75 MG tablet Take 1 tablet (75 mg total) by mouth daily. 90 tablet 3  . colchicine 0.6 MG tablet Take 1 tablet (0.6 mg total)  by mouth 4 (four) times daily as needed. 360 tablet 2  . furosemide (LASIX) 20 MG tablet TAKE 1-2 TABLETS BY MOUTH  DAILY AS NEEDED FOR EDEMA. 180 tablet 1  . isosorbide mononitrate (IMDUR) 30 MG 24 hr tablet Take 1 tablet (30 mg total) by mouth daily. 90 tablet 2  . levothyroxine (SYNTHROID, LEVOTHROID) 50 MCG tablet Take 1 tablet (50 mcg total) by mouth daily. 90 tablet 3  . metoprolol tartrate (LOPRESSOR) 25 MG tablet Take 0.5 tablets (12.5 mg total) by mouth 2 (two) times daily. 90 tablet 3  . naproxen sodium (ALEVE) 220 MG tablet Take 440 mg by mouth as needed (back pain).    . nitroGLYCERIN (NITROSTAT) 0.4 MG SL tablet Place 0.4 mg under the tongue every 5 (five) minutes as needed for chest pain.    Marland Kitchen OVER THE COUNTER MEDICATION Take 1 capsule by mouth daily. Eye vitamin    . tiZANidine (ZANAFLEX) 4 MG tablet Take 1 tablet (4 mg total) by mouth every 8 (eight) hours as needed for muscle spasms. 90 tablet 2  . traMADol (ULTRAM) 50 MG tablet Take 1 tablet (50 mg total) by mouth every 8 (eight) hours as needed. 100 tablet 2  . amLODipine (NORVASC) 5 MG tablet Take 1 tablet (5  mg total) by mouth daily. 30 tablet 11  . losartan (COZAAR) 25 MG tablet Take 1 tablet (25 mg total) by mouth daily. 30 tablet 11   No current facility-administered medications for this visit.     Allergies  Allergen Reactions  . Nabumetone Anaphylaxis and Other (See Comments)    Causes inflammation    Past Medical History:  Diagnosis Date  . Abdominal aortic aneurysm ([place])    followed by Dr. Sherren Mocha Early  . Adenomatous colon polyp 1998  . Arthritis   . Blind left eye   . CAD (coronary artery disease) 08/1995   50-55% second diag dz.; occluded OM  . Complication of anesthesia    "Blood pressure went down" with retinal surgery - no problems with previous surgeries  . Echocardiogram abnormal 03/2012   mild to mod AS, valve area1.58 cm2, mild concentric LVH, grade I diastolic dusfunction  . Gout   . Heart murmur    . Hyperlipidemia   . Hypertension   . MI (myocardial infarction) ([place]) 01/25/2018  . Myocardial infarction [facility])    mild/ age 30  . PAD (peripheral artery disease) ([facility])    mild to mod Rt ICA stenosis followed by doppler-02/05/12  . Pelvic mass   . Stroke [facility]) 2001   no residual problems  . Tobacco abuse   . Ulcer     Social History   Socioeconomic History  . Marital status: Widowed    Spouse name: Not on file  . Number of children: 5  . Years of education: 9  . Highest education level: 9th grade  Occupational History  . Occupation: retired    Comment: retired  Scientific laboratory technician  . Financial resource strain: Not on file  . Food insecurity:    Worry: Not on file    Inability: Not on file  . Transportation needs:    Medical: Not on file    Non-medical: Not on file  Tobacco Use  . Smoking status: Current Every Day Smoker    Packs/day: 1.00    Years: 62.00    Pack years: 62.00    Types: Cigarettes  . Smokeless tobacco: Never Used  Substance and Sexual Activity  . Alcohol use: Yes    Alcohol/week: 0.0 standard drinks    Comment: occasionally - few drinks/month  . Drug use: No  . Sexual activity: Never    Partners: Male    Birth control/protection: None, Surgical  Lifestyle  . Physical activity:    Days per week: Not on file    Minutes per session: Not on file  . Stress: Not on file  Relationships  . Social connections:    Talks on phone: Not on file    Gets together: Not on file    Attends religious service: Not on file    Active member of club or organization: Not on file    Attends meetings of clubs or organizations: Not on file    Relationship status: Not on file  . Intimate partner violence:    Fear of current or ex partner: Not on file    Emotionally abused: Not on file    Physically abused: Not on file    Forced sexual activity: Not on file  Other Topics Concern  . Not on file  Social History Narrative   9th grade. married '54- widowed Dec 10th'08. 4  sons - '55, '58, '60, '69; 1 daughter - '63; 12 grandchildren; 9 great-grandchildren. 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 Live -  own home, one son lives with her. Other children are close and attentive. ACP/End-of-life: No CPR, no mechanical ventilation, no futile/heroic measures.     Family History  Problem Relation Age of Onset  . Aneurysm Mother        AAA  . Hypertension Mother   . Hyperlipidemia Mother   . Heart disease Father        CAD, MI x 3  . Heart attack Father   . Cancer Maternal Aunt        breast cancer  . Diabetes Sister   . Hyperlipidemia Sister   . Hypertension Sister   . Colon cancer Neg Hx   . Stomach cancer Neg Hx      Review of Systems: General: negative for chills, fever, night sweats or weight changes.  Cardiovascular: negative for chest pain, dyspnea on exertion, edema, orthopnea, palpitations, paroxysmal nocturnal dyspnea or shortness of breath Dermatological: negative for rash Respiratory: negative for cough or wheezing Urologic: negative for hematuria Abdominal: negative for nausea, vomiting, diarrhea, bright red blood per rectum, melena, or hematemesis Neurologic: negative for visual changes, syncope, or dizziness All other systems reviewed and are otherwise negative except as noted above.    Blood pressure (!) 170/83, pulse 61, height 5\' 3"  (1.6 m), weight 143 lb 12.8 oz (65.2 kg).  General appearance: alert, cooperative and no distress Neck: transmitted murmur, no JVD Lungs: scattered rhonchi Heart: regular rate and rhythm and 2/6 systolic murmur AOV, diminnished S2 Extremities: no edema Skin: warm and dry Neurologic: Grossly normal   ASSESSMENT AND PLAN:   Essential hypertension Uncontrolled  H/O STEMI (ST elevation myocardial infarction) ([place]) Pt had STEMI 01/25/18-went to [facility], then transferred to [facility] and had PCI  CAD S/P percutaneous coronary angioplasty CFX and OM1 PCI with DES 01/25/18- [place], Dr  Wyline Copas  AAA (abdominal aortic aneurysm) without rupture Followed by Dr Donnetta Hutching, apparently not a stent graft candidate. CT scan Q 6 months  Severe aortic stenosis Echo July 2019- f/u scheduled for July 2020. She is asytmptomatic   PLAN  Add Losartan 25 mg and Amlodipine 5 mg.  F/U 2-3 weeks and adjust if needed.  Avoid hypotension with severe AS.   Kerin Ransom PA-C 06/08/2018 1:13 PM

## 2018-06-29 ENCOUNTER — Encounter: Payer: Self-pay | Admitting: Cardiology

## 2018-06-29 ENCOUNTER — Ambulatory Visit (INDEPENDENT_AMBULATORY_CARE_PROVIDER_SITE_OTHER): Payer: Medicare Other | Admitting: Cardiology

## 2018-06-29 VITALS — BP 150/71 | HR 59 | Ht 63.0 in | Wt 142.4 lb

## 2018-06-29 DIAGNOSIS — N2889 Other specified disorders of kidney and ureter: Secondary | ICD-10-CM | POA: Insufficient documentation

## 2018-06-29 DIAGNOSIS — I350 Nonrheumatic aortic (valve) stenosis: Secondary | ICD-10-CM

## 2018-06-29 DIAGNOSIS — F17200 Nicotine dependence, unspecified, uncomplicated: Secondary | ICD-10-CM | POA: Diagnosis not present

## 2018-06-29 DIAGNOSIS — I714 Abdominal aortic aneurysm, without rupture, unspecified: Secondary | ICD-10-CM

## 2018-06-29 DIAGNOSIS — I252 Old myocardial infarction: Secondary | ICD-10-CM

## 2018-06-29 DIAGNOSIS — Z9861 Coronary angioplasty status: Secondary | ICD-10-CM

## 2018-06-29 DIAGNOSIS — Z8673 Personal history of transient ischemic attack (TIA), and cerebral infarction without residual deficits: Secondary | ICD-10-CM | POA: Diagnosis not present

## 2018-06-29 DIAGNOSIS — I2510 Atherosclerotic heart disease of native coronary artery without angina pectoris: Secondary | ICD-10-CM

## 2018-06-29 DIAGNOSIS — Z8679 Personal history of other diseases of the circulatory system: Secondary | ICD-10-CM

## 2018-06-29 DIAGNOSIS — I10 Essential (primary) hypertension: Secondary | ICD-10-CM

## 2018-06-29 DIAGNOSIS — N182 Chronic kidney disease, stage 2 (mild): Secondary | ICD-10-CM | POA: Insufficient documentation

## 2018-06-29 DIAGNOSIS — E785 Hyperlipidemia, unspecified: Secondary | ICD-10-CM

## 2018-06-29 LAB — BASIC METABOLIC PANEL
BUN/Creatinine Ratio: 25 (ref 12–28)
BUN: 25 mg/dL (ref 8–27)
CO2: 22 mmol/L (ref 20–29)
Calcium: 9.4 mg/dL (ref 8.7–10.3)
Chloride: 106 mmol/L (ref 96–106)
Creatinine, Ser: 1.01 mg/dL — ABNORMAL HIGH (ref 0.57–1.00)
GFR calc Af Amer: 60 mL/min/{1.73_m2} (ref >59)
GFR calc non Af Amer: 52 mL/min/{1.73_m2} — ABNORMAL LOW (ref >59)
Glucose: 104 mg/dL — ABNORMAL HIGH (ref 65–99)
Potassium: 4.3 mmol/L (ref 3.5–5.2)
Sodium: 142 mmol/L (ref 134–144)

## 2018-06-29 NOTE — Progress Notes (Signed)
06/29/2018 Tammy Brooks   04/11/1934  [id number]  Primary Physician Plotnikov, Evie Lacks, MD Primary Cardiologist: [name]  HPI:  Tammy Brooks 85 y/o female, lives at home with her son, followed by [name] with a history of remote MI-cath in '99 showed mild CAD, Myoview 2015 was low risk. She has severe AS with normal LVF by echo July 2019. She has PVD with mild carotid disease and a 4.9 sm AAA by [place] Jan 2019. She has COPD and smokes 1/2 pack a day. Dr Donnetta Hutching has noted an increase in her AAA and is following her Q 6 months now. Apparently she is not a candidate for a stent graft.   The pt was admitted 01/25/18 to [facility] with chest pain and was diagnosed with a posterior STEMI.  She was transferred to [facility] and underwent cath and PCI by Dr Wyline Copas.  She had a mCFX and an OM1 stent placed.  She had moderate RCA and Dx disease and initially an EF of 40-45% but her EF was normal by post PCI echo (moderate AS in [facility]).  Her course was complicated by transient hypotension and NSVT.  We added low dose nitrate for chest pain in Sept 2019 and she denies any chest pain since.   She was seen in the office 06/08/2018 because she noticed her B/P has was elevated.  She had been taken off her Amlodipine and telmisartan when she had her MI secondary to hypotension. When I saw her 06/08/2018 I put her on Amlodipine 5mg  and Losartan 25 mg. She returns today for follow up.  She brought a list of her home B/P with her.  Overall her B/P is better- usually between 409-811 systolic and 80 or less diastolic. The patient denies chest pain, DOE, near syncope or syncope.    Current Outpatient Medications  Medication Sig Dispense Refill  . allopurinol (ZYLOPRIM) 100 MG tablet Take 1 tablet (100 mg total) by mouth daily. 90 tablet 3  . amLODipine (NORVASC) 5 MG tablet Take 1 tablet (5 mg total) by mouth daily. 30 tablet 11  . aspirin EC 81 MG tablet Take 81 mg by mouth daily.    Marland Kitchen atorvastatin  (LIPITOR) 40 MG tablet Take 2 tablets (80 mg total) by mouth daily. 180 tablet 3  . cholecalciferol (VITAMIN D) 1000 units tablet Take 1,000 Units by mouth daily.    . clopidogrel (PLAVIX) 75 MG tablet Take 1 tablet (75 mg total) by mouth daily. 90 tablet 3  . colchicine 0.6 MG tablet Take 1 tablet (0.6 mg total) by mouth 4 (four) times daily as needed. 360 tablet 2  . furosemide (LASIX) 20 MG tablet TAKE 1-2 TABLETS BY MOUTH  DAILY AS NEEDED FOR EDEMA. 180 tablet 1  . isosorbide mononitrate (IMDUR) 30 MG 24 hr tablet Take 1 tablet (30 mg total) by mouth daily. 90 tablet 2  . levothyroxine (SYNTHROID, LEVOTHROID) 50 MCG tablet Take 1 tablet (50 mcg total) by mouth daily. 90 tablet 3  . losartan (COZAAR) 25 MG tablet Take 1 tablet (25 mg total) by mouth daily. 30 tablet 11  . metoprolol tartrate (LOPRESSOR) 25 MG tablet Take 0.5 tablets (12.5 mg total) by mouth 2 (two) times daily. 90 tablet 3  . naproxen sodium (ALEVE) 220 MG tablet Take 440 mg by mouth as needed (back pain).    . nitroGLYCERIN (NITROSTAT) 0.4 MG SL tablet Place 0.4 mg under the tongue every 5 (five) minutes as needed for  chest pain.    Marland Kitchen OVER THE COUNTER MEDICATION Take 1 capsule by mouth daily. Eye vitamin    . tiZANidine (ZANAFLEX) 4 MG tablet Take 1 tablet (4 mg total) by mouth every 8 (eight) hours as needed for muscle spasms. 90 tablet 2  . traMADol (ULTRAM) 50 MG tablet Take 1 tablet (50 mg total) by mouth every 8 (eight) hours as needed. 100 tablet 2   No current facility-administered medications for this visit.     Allergies  Allergen Reactions  . Nabumetone Anaphylaxis and Other (See Comments)    Causes inflammation    Past Medical History:  Diagnosis Date  . Abdominal aortic aneurysm ([place])    followed by Dr. Sherren Mocha Early  . Adenomatous colon polyp 1998  . Arthritis   . Blind left eye   . CAD (coronary artery disease) 08/1995   50-55% second diag dz.; occluded OM  . Complication of anesthesia    "Blood  pressure went down" with retinal surgery - no problems with previous surgeries  . Echocardiogram abnormal 03/2012   mild to mod AS, valve area1.58 cm2, mild concentric LVH, grade I diastolic dusfunction  . Gout   . Heart murmur   . Hyperlipidemia   . Hypertension   . MI (myocardial infarction) ([place]) 01/25/2018  . Myocardial infarction [facility])    mild/ age 39  . PAD (peripheral artery disease) ([facility])    mild to mod Rt ICA stenosis followed by doppler-02/05/12  . Pelvic mass   . Stroke [facility]) 2001   no residual problems  . Tobacco abuse   . Ulcer     Social History   Socioeconomic History  . Marital status: Widowed    Spouse name: Not on file  . Number of children: 5  . Years of education: 9  . Highest education level: 9th grade  Occupational History  . Occupation: retired    Comment: retired  Scientific laboratory technician  . Financial resource strain: Not on file  . Food insecurity:    Worry: Not on file    Inability: Not on file  . Transportation needs:    Medical: Not on file    Non-medical: Not on file  Tobacco Use  . Smoking status: Current Every Day Smoker    Packs/day: 1.00    Years: 62.00    Pack years: 62.00    Types: Cigarettes  . Smokeless tobacco: Never Used  Substance and Sexual Activity  . Alcohol use: Yes    Alcohol/week: 0.0 standard drinks    Comment: occasionally - few drinks/month  . Drug use: No  . Sexual activity: Never    Partners: Male    Birth control/protection: None, Surgical  Lifestyle  . Physical activity:    Days per week: Not on file    Minutes per session: Not on file  . Stress: Not on file  Relationships  . Social connections:    Talks on phone: Not on file    Gets together: Not on file    Attends religious service: Not on file    Active member of club or organization: Not on file    Attends meetings of clubs or organizations: Not on file    Relationship status: Not on file  . Intimate partner violence:    Fear of current or ex partner: Not on  file    Emotionally abused: Not on file    Physically abused: Not on file    Forced sexual activity: Not on file  Other  Topics Concern  . Not on file  Social History Narrative   9th grade. married '54- widowed Dec 10th'08. 4 sons - '55, '58, '60, '69; 1 daughter - '63; 12 grandchildren; 9 great-grandchildren. 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 Other children are close and attentive. ACP/End-of-life: No CPR, no mechanical ventilation, no futile/heroic measures.     Family History  Problem Relation Age of Onset  . Aneurysm Mother        AAA  . Hypertension Mother   . Hyperlipidemia Mother   . Heart disease Father        CAD, MI x 3  . Heart attack Father   . Cancer Maternal Aunt        breast cancer  . Diabetes Sister   . Hyperlipidemia Sister   . Hypertension Sister   . Colon cancer Neg Hx   . Stomach cancer Neg Hx      Review of Systems: General: negative for chills, fever, night sweats or weight changes.  Cardiovascular: negative for chest pain, dyspnea on exertion, edema, orthopnea, palpitations, paroxysmal nocturnal dyspnea or shortness of breath Dermatological: negative for rash Respiratory: negative for cough or wheezing Urologic: negative for hematuria Abdominal: negative for nausea, vomiting, diarrhea, bright red blood per rectum, melena, or hematemesis Neurologic: negative for visual changes, syncope, or dizziness All other systems reviewed and are otherwise negative except as noted above.    Blood pressure (!) 150/71, pulse (!) 59, height 5\' 3"  (1.6 m), weight 142 lb 6.4 oz (64.6 kg).  General appearance: alert, cooperative and no distress Lungs: decreased breath sounds c/w COPD Heart: regular rate and rhythm and 2/6 systolic murmur AOV preserved S2 Neurologic: Grossly normal Extrem: no edema Skin: warm and dry  ASSESSMENT AND PLAN:   Essential hypertension Better control with the addition of Amlodipine 5  mg and Losartan 25 mg- check BMP today  H/O STEMI (ST elevation myocardial infarction) ([place]) Pt had STEMI 01/25/18-went to [facility], then transferred to [facility] and had PCI  CAD S/P percutaneous coronary angioplasty CFX and OM1 PCI with DES 01/25/18- [place], Dr Wyline Copas  AAA (abdominal aortic aneurysm) without rupture Followed by Dr Donnetta Hutching, apparently not a stent graft candidate. CT scan Q 6 months  Severe aortic stenosis Severe AS in July [facility] moderate in Aug 2019 [facility])  f/u scheduled for July 2020. She is asytmptomatic   PLAN  Check BMP.  F/U with [name] after her echo in July.  We again discussed smoking cessation.    Kerin Ransom PA-C 06/29/2018 10:09 AM

## 2018-06-29 NOTE — Patient Instructions (Signed)
Medication Instructions:  Your physician recommends that you continue on your current medications as directed. Please refer to the Current Medication list given to you today. If you need a refill on your cardiac medications before your next appointment, please call your pharmacy.   Lab work: Your physician recommends that you return for lab work in: TODAY-BMET If you have labs (blood work) drawn today and your tests are completely normal, you will receive your results only by: Marland Kitchen MyChart Message (if you have MyChart) OR . A paper copy in the mail If you have any lab test that is abnormal or we need to change your treatment, we will call you to review the results.  Testing/Procedures: NONE   Follow-Up: At [facility], you and your health needs are our priority.  As part of our continuing mission to provide you with exceptional heart care, we have created designated Provider Care Teams.  These Care Teams include your primary Cardiologist (physician) and Advanced Practice Providers (APPs -  Physician Assistants and Nurse Practitioners) who all work together to provide you with the care you need, when you need it. You will need a follow up appointment in 6 months.  Please call our office 2 months in advance to schedule this appointment.  You may see Quay Burow, MD AFTER ECHOCARDIOGRAM.  Any Other Special Instructions Will Be Listed Below (If Applicable).

## 2018-07-06 ENCOUNTER — Ambulatory Visit (INDEPENDENT_AMBULATORY_CARE_PROVIDER_SITE_OTHER): Payer: Medicare Other | Admitting: Internal Medicine

## 2018-07-06 ENCOUNTER — Encounter: Payer: Self-pay | Admitting: Internal Medicine

## 2018-07-06 DIAGNOSIS — N182 Chronic kidney disease, stage 2 (mild): Secondary | ICD-10-CM

## 2018-07-06 DIAGNOSIS — L570 Actinic keratosis: Secondary | ICD-10-CM | POA: Diagnosis not present

## 2018-07-06 DIAGNOSIS — I10 Essential (primary) hypertension: Secondary | ICD-10-CM

## 2018-07-06 DIAGNOSIS — E785 Hyperlipidemia, unspecified: Secondary | ICD-10-CM | POA: Diagnosis not present

## 2018-07-06 NOTE — Assessment & Plan Note (Signed)
Amlodipine, Losartan

## 2018-07-06 NOTE — Assessment & Plan Note (Signed)
F/u w/Derm

## 2018-07-06 NOTE — Assessment & Plan Note (Signed)
LIPITOR

## 2018-07-06 NOTE — Progress Notes (Signed)
Subjective:  Patient ID: Tammy Brooks, female    DOB: 1934-01-28  Age: 85 y.o. MRN: [id number]  CC: No chief complaint on file.   HPI ADAYSHA DUBINSKY presents for HTN, CAD,   Outpatient Medications Prior to Visit  Medication Sig Dispense Refill  . allopurinol (ZYLOPRIM) 100 MG tablet Take 1 tablet (100 mg total) by mouth daily. 90 tablet 3  . amLODipine (NORVASC) 5 MG tablet Take 1 tablet (5 mg total) by mouth daily. 30 tablet 11  . aspirin EC 81 MG tablet Take 81 mg by mouth daily.    Marland Kitchen atorvastatin (LIPITOR) 40 MG tablet Take 2 tablets (80 mg total) by mouth daily. 180 tablet 3  . cholecalciferol (VITAMIN D) 1000 units tablet Take 1,000 Units by mouth daily.    . clopidogrel (PLAVIX) 75 MG tablet Take 1 tablet (75 mg total) by mouth daily. 90 tablet 3  . colchicine 0.6 MG tablet Take 1 tablet (0.6 mg total) by mouth 4 (four) times daily as needed. 360 tablet 2  . furosemide (LASIX) 20 MG tablet TAKE 1-2 TABLETS BY MOUTH  DAILY AS NEEDED FOR EDEMA. 180 tablet 1  . isosorbide mononitrate (IMDUR) 30 MG 24 hr tablet Take 1 tablet (30 mg total) by mouth daily. 90 tablet 2  . levothyroxine (SYNTHROID, LEVOTHROID) 50 MCG tablet Take 1 tablet (50 mcg total) by mouth daily. 90 tablet 3  . losartan (COZAAR) 25 MG tablet Take 1 tablet (25 mg total) by mouth daily. 30 tablet 11  . metoprolol tartrate (LOPRESSOR) 25 MG tablet Take 0.5 tablets (12.5 mg total) by mouth 2 (two) times daily. 90 tablet 3  . naproxen sodium (ALEVE) 220 MG tablet Take 440 mg by mouth as needed (back pain).    . nitroGLYCERIN (NITROSTAT) 0.4 MG SL tablet Place 0.4 mg under the tongue every 5 (five) minutes as needed for chest pain.    Marland Kitchen OVER THE COUNTER MEDICATION Take 1 capsule by mouth daily. Eye vitamin    . tiZANidine (ZANAFLEX) 4 MG tablet Take 1 tablet (4 mg total) by mouth every 8 (eight) hours as needed for muscle spasms. 90 tablet 2  . traMADol (ULTRAM) 50 MG tablet Take 1 tablet (50 mg total) by mouth every 8 (eight)  hours as needed. 100 tablet 2   No facility-administered medications prior to visit.     ROS: Review of Systems  Constitutional: Negative for activity change, appetite change, chills, fatigue and unexpected weight change.  HENT: Negative for congestion, mouth sores and sinus pressure.   Eyes: Negative for visual disturbance.  Respiratory: Negative for cough and chest tightness.   Gastrointestinal: Negative for abdominal pain and nausea.  Genitourinary: Negative for difficulty urinating, frequency and vaginal pain.  Musculoskeletal: Positive for arthralgias. Negative for back pain and gait problem.  Skin: Negative for pallor and rash.  Neurological: Negative for dizziness, tremors, weakness, numbness and headaches.  Psychiatric/Behavioral: Negative for confusion, sleep disturbance and suicidal ideas. The patient is not nervous/anxious.     Objective:  BP 124/78 (BP Location: Left Arm, Patient Position: Sitting, Cuff Size: Normal)   Pulse 60   Temp 98 F (36.7 C) (Oral)   Ht 5\' 3"  (1.6 m)   Wt 144 lb (65.3 kg)   SpO2 99%   BMI 25.51 kg/m   BP Readings from Last 3 Encounters:  07/06/18 124/78  06/29/18 (!) 150/71  06/08/18 (!) 170/83    Wt Readings from Last 3 Encounters:  07/06/18 144 lb (65.3  kg)  06/29/18 142 lb 6.4 oz (64.6 kg)  06/08/18 143 lb 12.8 oz (65.2 kg)    Physical Exam Constitutional:      General: She is not in acute distress.    Appearance: She is well-developed.  HENT:     Head: Normocephalic.     Right Ear: External ear normal.     Left Ear: External ear normal.     Nose: Nose normal.  Eyes:     General:        Right eye: No discharge.        Left eye: No discharge.     Conjunctiva/sclera: Conjunctivae normal.     Pupils: Pupils are equal, round, and reactive to light.  Neck:     Musculoskeletal: Normal range of motion and neck supple.     Thyroid: No thyromegaly.     Vascular: No JVD.     Trachea: No tracheal deviation.  Cardiovascular:      Rate and Rhythm: Normal rate and regular rhythm.     Heart sounds: Normal heart sounds.  Pulmonary:     Effort: No respiratory distress.     Breath sounds: No stridor. No wheezing.  Abdominal:     General: Bowel sounds are normal. There is no distension.     Palpations: Abdomen is soft. There is no mass.     Tenderness: There is no abdominal tenderness. There is no guarding or rebound.  Musculoskeletal:        General: No tenderness.  Lymphadenopathy:     Cervical: No cervical adenopathy.  Skin:    Findings: Rash present. No erythema.  Neurological:     Cranial Nerves: No cranial nerve deficit.     Motor: No abnormal muscle tone.     Coordination: Coordination normal.     Deep Tendon Reflexes: Reflexes normal.  Psychiatric:        Behavior: Behavior normal.        Thought Content: Thought content normal.        Judgment: Judgment normal.    AKs   Lab Results  Component Value Date   WBC 8.8 10/15/2017   HGB 14.9 10/15/2017   HCT 45.5 10/15/2017   PLT 235 10/15/2017   GLUCOSE 104 (H) 06/29/2018   CHOL 121 04/02/2018   TRIG 95.0 04/02/2018   HDL 31.50 (L) 04/02/2018   LDLDIRECT 76.7 11/17/2008   LDLCALC 70 04/02/2018   ALT 12 04/02/2018   AST 16 04/02/2018   NA 142 06/29/2018   K 4.3 06/29/2018   CL 106 06/29/2018   CREATININE 1.01 (H) 06/29/2018   BUN 25 06/29/2018   CO2 22 06/29/2018   TSH 5.54 (H) 08/21/2017    Ct Angio Abdomen Pelvis  W &/or Wo Contrast  Result Date: 01/06/2018 CLINICAL DATA:  85 year old female with a history abdominal aortic aneurysm EXAM: CTA ABDOMEN AND PELVIS wITHOUT AND WITH CONTRAST TECHNIQUE: Multidetector CT imaging of the abdomen and pelvis was performed using the standard protocol during bolus administration of intravenous contrast. Multiplanar reconstructed images and MIPs were obtained and reviewed to evaluate the vascular anatomy. CONTRAST:  40mL ISOVUE-370 IOPAMIDOL (ISOVUE-370) INJECTION 76% COMPARISON:  12/16/2016, 03/07/2014  FINDINGS: VASCULAR Aorta: Moderate atherosclerotic changes of the visualized thoracic aorta. Diameter of the aorta at the hiatus measures 2.4 cm. Moderate to advanced atherosclerotic changes of the abdominal aorta. Infrarenal abdominal aortic aneurysm again demonstrated. On axial images the lowest renal artery is the right renal artery, with the diameter of the aorta measuring  2.6 cm just below the right renal artery origin. Essentially no neck, with fusiform dilation of the aorta. Greatest diameter on the parasagittal images measures 5.0 cm (image 123 of series 12). Measurement made on the prior CT at this location was 4.6 cm, with slight increase though less than 5 mm. Greatest diameter on the axial images is measured on image 64 of series 7. Diameter at this location measures 7.1 cm, however, this appears to overestimate the actual diameter size perpendicular to the flow channel. There is essentially circumferential thrombus/plaque throughout the length of the aneurysm with the flow channel maintained throughout. Aneurysm extends into the left common iliac artery. No periaortic inflammation or fluid. Celiac: Atherosclerotic changes at the origin of the celiac artery which remains patent. Accessory left hepatic artery. SMA: Atherosclerotic changes at the origin of the superior mesenteric artery with patent branches. Renals: Right renal artery is the lowest renal artery. Atherosclerotic changes at the origin the right renal artery without significant stenosis. Mixed calcified and soft plaque at the origin of the left renal artery with estimated 50% narrowing by CT on the axial images. IMA: Inferior mesenteric artery is occluded at the origin. Collateral flow fills the IMA Right lower extremity: Moderate atherosclerotic changes of the right iliac system. Hypogastric artery is patent. External iliac artery patent with moderate atherosclerotic changes. Common femoral artery patent with mild atherosclerotic changes.  Proximal profunda femoris and SFA patent. Left lower extremity: Aneurysm extends from the aorta into the left common iliac artery with the greatest diameter measuring 2.9 cm. Moderate atherosclerotic changes of the distal common iliac artery. Hypogastric artery is patent though likely stenotic at the origin. External iliac artery patent with mild atherosclerotic changes. Common femoral artery patent with mild atherosclerotic changes. Proximal profunda femoris and SFA patent. Veins: Unremarkable appearance of the venous system. Review of the MIP images confirms the above findings. NON-VASCULAR Lower chest: Emphysema of the visualized lung bases. 5 mm nodule at the periphery of the left lung base Hepatobiliary: Unremarkable appearance of the liver. Unremarkable gall bladder. Pancreas: Unremarkable appearance of the pancreas. No pericholecystic fluid or inflammatory changes. Unremarkable ductal system. Spleen: Unremarkable. Adrenals/Urinary Tract: Unchanged appearance of left adrenal nodule measuring 18 mm. Characteristics on prior CT compatible with adrenal adenoma. Unremarkable right adrenal gland. Right: No hydronephrosis. Symmetric perfusion to the left. No nephrolithiasis. Unremarkable course of the right ureter. Left: No hydronephrosis. Symmetric perfusion to the right. No nephrolithiasis. Unremarkable course of the left ureter. Bosniak 1 cyst of the lower left kidney. Unremarkable appearance of the urinary bladder . Stomach/Bowel: Unremarkable appearance of the stomach. Unremarkable appearance of small bowel. No evidence of obstruction. Colonic diverticula, with no associated inflammation. Appendix is not visualized, however, no inflammatory changes are present adjacent to the cecum to indicate an appendicitis. Lymphatic: No lymphadenopathy Mesenteric: No free fluid or air. No adenopathy. Reproductive: Hysterectomy Other: Ventral wall hernia of the lower abdomen/pelvis containing mesentery and loops of small  bowel. No evidence of associated obstruction. Musculoskeletal: No acute displaced fracture. Grade 1 anterolisthesis of L4 on L5. Multilevel degenerative changes of the thoracolumbar spine. IMPRESSION: No acute CT finding. Redemonstration of infrarenal abdominal aorta with complex neck anatomy and circumferential mural thrombus. Diameter which is favored to be the most representative of the aneurysm is again measured on the parasagittal images, estimated 5.0 cm on the current CT, which is slightly greater than the previous CT of 4.6 cm. Aortic aneurysm NOS (ICD10-I71.9). 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 Renal  artery atherosclerosis, likely greatest on the left where there may be 50% narrowing. Aortic and bilateral iliac atherosclerosis. Aortic Atherosclerosis (ICD10-I70.0). 5 mm nodule at the left lung base. Non-contrast chest CT is recommended in 12 months given patient's apparent risk factors. This recommendation follows the consensus statement: Guidelines for Management of Incidental Pulmonary Nodules Detected on CT Images: From the Fleischner Society 2017; Radiology 2017; 284:228-243. Left adrenal adenoma. Diverticular disease. Signed, Dulcy Fanny. Dellia Nims, RPVI [facility] Radiology Electronically Signed   By: Corrie Mckusick D.O.   On: 01/06/2018 10:59    Assessment & Plan:   There are no diagnoses linked to this encounter.   No orders of the defined types were placed in this encounter.    Follow-up: No follow-ups on file.  Walker Kehr, MD

## 2018-07-06 NOTE — Assessment & Plan Note (Signed)
Labs

## 2018-07-07 ENCOUNTER — Ambulatory Visit (HOSPITAL_COMMUNITY)
Admission: RE | Admit: 2018-07-07 | Discharge: 2018-07-07 | Disposition: A | Discharge status: Home / Self Care | Payer: Medicare Other | Source: Ambulatory Visit | Attending: Family | Admitting: Family

## 2018-07-07 ENCOUNTER — Other Ambulatory Visit: Payer: Self-pay

## 2018-07-07 ENCOUNTER — Encounter: Payer: Self-pay | Admitting: Vascular Surgery

## 2018-07-07 ENCOUNTER — Ambulatory Visit (INDEPENDENT_AMBULATORY_CARE_PROVIDER_SITE_OTHER): Payer: Medicare Other | Admitting: Vascular Surgery

## 2018-07-07 VITALS — BP 109/63 | HR 60 | Temp 97.1°F | Resp 16 | Ht 63.0 in | Wt 143.0 lb

## 2018-07-07 DIAGNOSIS — I714 Abdominal aortic aneurysm, without rupture, unspecified: Secondary | ICD-10-CM

## 2018-07-07 NOTE — Progress Notes (Signed)
[facility]ist of [place]  Patient name: Tammy Brooks MRN: [id number] DOB: May 27, 1934 Sex: female  REASON FOR VISIT: Follow-up abdominal aortic aneurysm  HPI: Tammy Brooks is a 85 y.o. female here today for follow-up.  She reports that she had a myocardial infarction since my last visit with her in August 2019.  Had stenting of her coronaries at [facility].  She has recovered from this.  She has no symptoms referable to her aneurysm.  No peripheral disease.  Past Medical History:  Diagnosis Date  . Abdominal aortic aneurysm ([place])    followed by Dr. Sherren Mocha   . Adenomatous colon polyp 1998  . Arthritis   . Blind left eye   . CAD (coronary artery disease) 08/1995   50-55% second diag dz.; occluded OM  . Complication of anesthesia    "Blood pressure went down" with retinal surgery - no problems with previous surgeries  . Echocardiogram abnormal 03/2012   mild to mod AS, valve area1.58 cm2, mild concentric LVH, grade I diastolic dusfunction  . Gout   . Heart murmur   . Hyperlipidemia   . Hypertension   . MI (myocardial infarction) ([place]) 01/25/2018  . Myocardial infarction [facility])    mild/ age 16  . PAD (peripheral artery disease) ([facility])    mild to mod Rt ICA stenosis followed by doppler-02/05/12  . Pelvic mass   . Stroke [facility]) 2001   no residual problems  . Tobacco abuse   . Ulcer     Family History  Problem Relation Age of Onset  . Aneurysm Mother        AAA  . Hypertension Mother   . Hyperlipidemia Mother   . Heart disease Father        CAD, MI x 3  . Heart attack Father   . Cancer Maternal Aunt        breast cancer  . Diabetes Sister   . Hyperlipidemia Sister   . Hypertension Sister   . Colon cancer Neg Hx   . Stomach cancer Neg Hx     SOCIAL HISTORY: Social History   Tobacco Use  . Smoking status: Current Every Day Smoker    Packs/day: 1.00    Years: 62.00    Pack years: 62.00    Types:  Cigarettes  . Smokeless tobacco: Never Used  Substance Use Topics  . Alcohol use: Yes    Alcohol/week: 0.0 standard drinks    Comment: occasionally - few drinks/month    Allergies  Allergen Reactions  . Nabumetone Anaphylaxis and Other (See Comments)    Causes inflammation    Current Outpatient Medications  Medication Sig Dispense Refill  . allopurinol (ZYLOPRIM) 100 MG tablet Take 1 tablet (100 mg total) by mouth daily. 90 tablet 3  . amLODipine (NORVASC) 5 MG tablet Take 1 tablet (5 mg total) by mouth daily. 30 tablet 11  . aspirin EC 81 MG tablet Take 81 mg by mouth daily.    Marland Kitchen atorvastatin (LIPITOR) 40 MG tablet Take 2 tablets (80 mg total) by mouth daily. 180 tablet 3  . cholecalciferol (VITAMIN D) 1000 units tablet Take 1,000 Units by mouth daily.    . clopidogrel (PLAVIX) 75 MG tablet Take 1 tablet (75 mg total) by mouth daily. 90 tablet 3  . colchicine 0.6 MG tablet Take 1 tablet (0.6 mg total) by mouth 4 (four) times daily as needed. 360 tablet 2  . furosemide (LASIX) 20 MG tablet TAKE 1-2  TABLETS BY MOUTH  DAILY AS NEEDED FOR EDEMA. 180 tablet 1  . isosorbide mononitrate (IMDUR) 30 MG 24 hr tablet Take 1 tablet (30 mg total) by mouth daily. 90 tablet 2  . levothyroxine (SYNTHROID, LEVOTHROID) 50 MCG tablet Take 1 tablet (50 mcg total) by mouth daily. 90 tablet 3  . losartan (COZAAR) 25 MG tablet Take 1 tablet (25 mg total) by mouth daily. 30 tablet 11  . metoprolol tartrate (LOPRESSOR) 25 MG tablet Take 0.5 tablets (12.5 mg total) by mouth 2 (two) times daily. 90 tablet 3  . naproxen sodium (ALEVE) 220 MG tablet Take 440 mg by mouth as needed (back pain).    . nitroGLYCERIN (NITROSTAT) 0.4 MG SL tablet Place 0.4 mg under the tongue every 5 (five) minutes as needed for chest pain.    Marland Kitchen OVER THE COUNTER MEDICATION Take 1 capsule by mouth daily. Eye vitamin    . tiZANidine (ZANAFLEX) 4 MG tablet Take 1 tablet (4 mg total) by mouth every 8 (eight) hours as needed for muscle  spasms. 90 tablet 2  . traMADol (ULTRAM) 50 MG tablet Take 1 tablet (50 mg total) by mouth every 8 (eight) hours as needed. 100 tablet 2   No current facility-administered medications for this visit.     REVIEW OF SYSTEMS:  [X]  denotes positive finding, [ ]  denotes negative finding Cardiac  Comments:  Chest pain or chest pressure:    Shortness of breath upon exertion:    Short of breath when lying flat:    Irregular heart rhythm:        Vascular    Pain in calf, thigh, or hip brought on by ambulation:    Pain in feet at night that wakes you up from your sleep:     Blood clot in your veins:    Leg swelling:           PHYSICAL EXAM: Vitals:   07/07/18 1011  BP: 109/63  Pulse: 60  Resp: 16  Temp: (!) 97.1 F (36.2 C)  TempSrc: Oral  SpO2: 96%  Weight: 143 lb (64.9 kg)  Height: 5\' 3"  (1.6 m)    GENERAL: The patient is a well-nourished female, in no acute distress. The vital signs are documented above. CARDIOVASCULAR: 2+ radial 2+ femoral and 2+ dorsalis pedis pulses bilaterally.  Abdomen nontender and I do not palpate an aneurysm PULMONARY: There is good air exchange  MUSCULOSKELETAL: There are no major deformities or cyanosis. NEUROLOGIC: No focal weakness or paresthesias are detected. SKIN: There are no ulcers or rashes noted. PSYCHIATRIC: The patient has a normal affect.  DATA:  Ultrasound today shows maximal diameter of 5.0 cm which is unchanged from her CT scan 6 months ago  MEDICAL ISSUES: Stable infrarenal abdominal aortic aneurysm.  Her CT showed that this went up to her renal arteries making her not a candidate for stent graft.  I feel that her risk for rupture is less than her risk for open abdominal surgery at her age and coronary status.  Recommend serial 78-month follow-up with ultrasound to rule out enlargement.  Again reviewed symptoms of leaking aneurysm and she knows to call 911 to present immediately to the hospital should this occur    Rosetta Posner,  MD [facility] Tel [phone] Pager [phone]

## 2018-07-10 NOTE — Telephone Encounter (Signed)
PA approved through 06/03/19

## 2018-07-27 DIAGNOSIS — H548 Legal blindness, as defined in USA: Secondary | ICD-10-CM | POA: Diagnosis not present

## 2018-07-27 DIAGNOSIS — H44512 Absolute glaucoma, left eye: Secondary | ICD-10-CM | POA: Diagnosis not present

## 2018-07-27 DIAGNOSIS — H524 Presbyopia: Secondary | ICD-10-CM | POA: Diagnosis not present

## 2018-07-27 DIAGNOSIS — H52201 Unspecified astigmatism, right eye: Secondary | ICD-10-CM | POA: Diagnosis not present

## 2018-07-27 DIAGNOSIS — H5211 Myopia, right eye: Secondary | ICD-10-CM | POA: Diagnosis not present

## 2018-08-31 ENCOUNTER — Observation Stay (HOSPITAL_COMMUNITY)
Admission: EM | Admit: 2018-08-31 | Discharge: 2018-08-31 | Disposition: A | Discharge status: Home / Self Care | Payer: Medicare Other | Attending: Internal Medicine | Admitting: Internal Medicine

## 2018-08-31 ENCOUNTER — Telehealth: Payer: Self-pay | Admitting: Cardiovascular Disease

## 2018-08-31 ENCOUNTER — Other Ambulatory Visit: Payer: Self-pay

## 2018-08-31 ENCOUNTER — Emergency Department (HOSPITAL_COMMUNITY): Payer: Medicare Other

## 2018-08-31 DIAGNOSIS — R0789 Other chest pain: Secondary | ICD-10-CM | POA: Diagnosis not present

## 2018-08-31 DIAGNOSIS — F17210 Nicotine dependence, cigarettes, uncomplicated: Secondary | ICD-10-CM | POA: Diagnosis not present

## 2018-08-31 DIAGNOSIS — Z955 Presence of coronary angioplasty implant and graft: Secondary | ICD-10-CM | POA: Diagnosis not present

## 2018-08-31 DIAGNOSIS — I739 Peripheral vascular disease, unspecified: Secondary | ICD-10-CM | POA: Insufficient documentation

## 2018-08-31 DIAGNOSIS — H5462 Unqualified visual loss, left eye, normal vision right eye: Secondary | ICD-10-CM | POA: Diagnosis not present

## 2018-08-31 DIAGNOSIS — Z7982 Long term (current) use of aspirin: Secondary | ICD-10-CM | POA: Insufficient documentation

## 2018-08-31 DIAGNOSIS — M109 Gout, unspecified: Secondary | ICD-10-CM | POA: Insufficient documentation

## 2018-08-31 DIAGNOSIS — Z8249 Family history of ischemic heart disease and other diseases of the circulatory system: Secondary | ICD-10-CM | POA: Diagnosis not present

## 2018-08-31 DIAGNOSIS — Z7902 Long term (current) use of antithrombotics/antiplatelets: Secondary | ICD-10-CM | POA: Insufficient documentation

## 2018-08-31 DIAGNOSIS — Z79890 Hormone replacement therapy (postmenopausal): Secondary | ICD-10-CM | POA: Diagnosis not present

## 2018-08-31 DIAGNOSIS — I10 Essential (primary) hypertension: Secondary | ICD-10-CM

## 2018-08-31 DIAGNOSIS — Z79899 Other long term (current) drug therapy: Secondary | ICD-10-CM | POA: Diagnosis not present

## 2018-08-31 DIAGNOSIS — I5032 Chronic diastolic (congestive) heart failure: Secondary | ICD-10-CM | POA: Diagnosis not present

## 2018-08-31 DIAGNOSIS — I350 Nonrheumatic aortic (valve) stenosis: Secondary | ICD-10-CM | POA: Insufficient documentation

## 2018-08-31 DIAGNOSIS — I2510 Atherosclerotic heart disease of native coronary artery without angina pectoris: Secondary | ICD-10-CM | POA: Diagnosis not present

## 2018-08-31 DIAGNOSIS — R9431 Abnormal electrocardiogram [ECG] [EKG]: Secondary | ICD-10-CM | POA: Diagnosis not present

## 2018-08-31 DIAGNOSIS — J449 Chronic obstructive pulmonary disease, unspecified: Secondary | ICD-10-CM | POA: Diagnosis not present

## 2018-08-31 DIAGNOSIS — Z8673 Personal history of transient ischemic attack (TIA), and cerebral infarction without residual deficits: Secondary | ICD-10-CM | POA: Diagnosis not present

## 2018-08-31 DIAGNOSIS — E039 Hypothyroidism, unspecified: Secondary | ICD-10-CM | POA: Diagnosis not present

## 2018-08-31 DIAGNOSIS — R079 Chest pain, unspecified: Secondary | ICD-10-CM

## 2018-08-31 DIAGNOSIS — E785 Hyperlipidemia, unspecified: Secondary | ICD-10-CM

## 2018-08-31 DIAGNOSIS — E1169 Type 2 diabetes mellitus with other specified complication: Secondary | ICD-10-CM | POA: Diagnosis not present

## 2018-08-31 DIAGNOSIS — M1990 Unspecified osteoarthritis, unspecified site: Secondary | ICD-10-CM | POA: Diagnosis not present

## 2018-08-31 DIAGNOSIS — I130 Hypertensive heart and chronic kidney disease with heart failure and stage 1 through stage 4 chronic kidney disease, or unspecified chronic kidney disease: Secondary | ICD-10-CM | POA: Insufficient documentation

## 2018-08-31 DIAGNOSIS — I700 Atherosclerosis of aorta: Secondary | ICD-10-CM | POA: Diagnosis not present

## 2018-08-31 DIAGNOSIS — I714 Abdominal aortic aneurysm, without rupture: Secondary | ICD-10-CM

## 2018-08-31 DIAGNOSIS — I252 Old myocardial infarction: Secondary | ICD-10-CM | POA: Diagnosis not present

## 2018-08-31 LAB — CBC WITH DIFFERENTIAL/PLATELET
Abs Immature Granulocytes: 0.02 10*3/uL (ref 0.00–0.07)
Basophils Absolute: 0.1 10*3/uL (ref 0.0–0.1)
Basophils Relative: 1 %
EOS ABS: 0.1 10*3/uL (ref 0.0–0.5)
Eosinophils Relative: 2 %
HEMATOCRIT: 44.5 % (ref 36.0–46.0)
Hemoglobin: 13.7 g/dL (ref 12.0–15.0)
Immature Granulocytes: 0 %
Lymphocytes Relative: 25 %
Lymphs Abs: 1.8 10*3/uL (ref 0.7–4.0)
MCH: 27.1 pg (ref 26.0–34.0)
MCHC: 30.8 g/dL (ref 30.0–36.0)
MCV: 88.1 fL (ref 80.0–100.0)
Monocytes Absolute: 0.4 10*3/uL (ref 0.1–1.0)
Monocytes Relative: 5 %
Neutro Abs: 4.7 10*3/uL (ref 1.7–7.7)
Neutrophils Relative %: 67 %
Platelets: 226 10*3/uL (ref 150–400)
RBC: 5.05 MIL/uL (ref 3.87–5.11)
RDW: 14.6 % (ref 11.5–15.5)
WBC: 7.1 10*3/uL (ref 4.0–10.5)
nRBC: 0 % (ref 0.0–0.2)

## 2018-08-31 LAB — TROPONIN I
Troponin I: <0.03 ng/mL (ref <0.03)
Troponin I: <0.03 ng/mL (ref <0.03)

## 2018-08-31 LAB — COMPREHENSIVE METABOLIC PANEL
ALT: 12 U/L (ref 0–44)
AST: 19 U/L (ref 15–41)
Albumin: 3.6 g/dL (ref 3.5–5.0)
Alkaline Phosphatase: 58 U/L (ref 38–126)
Anion gap: 6 (ref 5–15)
BUN: 11 mg/dL (ref 8–23)
CO2: 27 mmol/L (ref 22–32)
Calcium: 9.1 mg/dL (ref 8.9–10.3)
Chloride: 106 mmol/L (ref 98–111)
Creatinine, Ser: 0.96 mg/dL (ref 0.44–1.00)
GFR calc Af Amer: >60 mL/min (ref >60)
GFR calc non Af Amer: 55 mL/min — ABNORMAL LOW (ref >60)
Glucose, Bld: 88 mg/dL (ref 70–99)
Potassium: 4.1 mmol/L (ref 3.5–5.1)
Sodium: 139 mmol/L (ref 135–145)
TOTAL PROTEIN: 6.1 g/dL — AB (ref 6.5–8.1)
Total Bilirubin: 0.8 mg/dL (ref 0.3–1.2)

## 2018-08-31 MED ORDER — ASPIRIN 81 MG PO CHEW
324.0000 mg | CHEWABLE_TABLET | Freq: Once | ORAL | Status: AC
  Administered 2018-08-31: 324 mg via ORAL
  Filled 2018-08-31: qty 4

## 2018-08-31 NOTE — Consult Note (Signed)
Cardiology Consult History and Physical:   Patient ID: Tammy Brooks MRN: [id number]; DOB: 1934-01-24   Admission date: 08/31/2018  Primary Care Provider: Cassandria Anger, MD Primary Cardiologist: Quay Burow, MD  Chief Complaint:  Chest pain  Patient Profile:   Tammy Brooks is a 85 y.o. female with hx of CAD, HTN, HLD, AAA followed by Dr. Donnetta Hutching, CVA, COPD, ongoing tobacco smoking and aortic stenosis  who is being seen today for the evaluation of chest pain at the request of Dr. Laren Everts.   History of remote MI-cath in '99 showed mild CAD, Myoview 2015 was low risk. She hassevereAS with normal LVF by echo July 2019. She has PVD with mild carotid disease and a 4.9 sm AAA by [place] Jan 2019. Dr Donnetta Hutching has noted an increase in her AAA however she is not a candidate for a stent graft.   The ptwas admitted8/25/[facility] with chest pain and was diagnosed with a posterior STEMI. She was transferred to [facility] and underwent cath and PCI by Dr Wyline Copas. She had a mCFX and an OM1 stent placed. (detained cath report below). She had moderate RCA and Dx disease and initially an EF of 40-45% but her EF wasnormal by post PCI echo (moderate AS in [facility]). Her course was complicated by transient hypotension and NSVT. We added low dose nitrate for chest pain in Sept 2019 and she denies any chest pain since.   Last seen by Kerin Ransom 06/29/2018 >> antihypertensive adjusted.   History of Present Illness:   Ms. Tammy Brooks presented for evaluation of chest and back pain for past 3 days. Says she has pain between shoulder blade which radiates to right side of her chest. This is constant, dull achy since Saturday. Noting makes batter. Exacerbates by movement with occasionally radiation to R shoulder. No associated dyspnea, nausea, vomiting or diaphoresis. She thought about her last MI in 2019 but much less intensity and she was tempted to take a muscle relaxant but did not. She got 1  nitro prior to calling EMS without improvement. Continues to smoker 1/2 pack a day.   She denies COVID 19 symptoms of dyspnea, cough, congestion, fever, sick contact or travel. No orthopnea or PND.   In ER, electrolytes and Scr are normal. Troponin < 0.03. CXR with slight vascular congestion.   EKG showed NSR with TWI in lead V5 & 6, see below for details.   Past Medical History:  Diagnosis Date  . Abdominal aortic aneurysm ([place])    followed by Dr. Sherren Mocha Early  . Adenomatous colon polyp 1998  . Arthritis   . Blind left eye   . CAD (coronary artery disease) 08/1995   50-55% second diag dz.; occluded OM  . Complication of anesthesia    "Blood pressure went down" with retinal surgery - no problems with previous surgeries  . Echocardiogram abnormal 03/2012   mild to mod AS, valve area1.58 cm2, mild concentric LVH, grade I diastolic dusfunction  . Gout   . Heart murmur   . Hyperlipidemia   . Hypertension   . MI (myocardial infarction) ([place]) 01/25/2018  . Myocardial infarction [facility])    mild/ age 26  . PAD (peripheral artery disease) ([facility])    mild to mod Rt ICA stenosis followed by doppler-02/05/12  . Pelvic mass   . Stroke [facility]) 2001   no residual problems  . Tobacco abuse   . Ulcer     Past Surgical History:  Procedure Laterality  Date  . ABDOMINAL HYSTERECTOMY    . APPENDECTOMY    . CARDIAC CATHETERIZATION  08/1995   50-55% second diag lesion, occluded OM  . CATARACT EXTRACTION W/ INTRAOCULAR LENS  IMPLANT, BILATERAL    . COLONOSCOPY    . EYE SURGERY     lense implant removed/ blind in left eye  . LAPAROTOMY N/A 04/26/2014   Procedure: EXPLORATORY LAPAROTOMY/RESECTION OF PELVIC MASS;  Surgeon: Alvino Chapel, MD;  Location: [facility] ORS;  Service: Gynecology;  Laterality: N/A;  . OOPHORECTOMY     and fallopian tube  . POLYPECTOMY    . RETINAL DETACHMENT SURGERY  2006  . TONSILLECTOMY AND ADENOIDECTOMY    . TUBAL LIGATION    . UPPER GASTROINTESTINAL ENDOSCOPY      treated for h-pylori     Medications Prior to Admission: Prior to Admission medications   Medication Sig Start Date End Date Taking? Authorizing Provider  allopurinol (ZYLOPRIM) 100 MG tablet Take 1 tablet (100 mg total) by mouth daily. 08/21/17   Plotnikov, Evie Lacks, MD  amLODipine (NORVASC) 5 MG tablet Take 1 tablet (5 mg total) by mouth daily. 06/08/18 06/03/19  Erlene Quan, PA-C  aspirin EC 81 MG tablet Take 81 mg by mouth daily.    [provider]  atorvastatin (LIPITOR) 40 MG tablet Take 2 tablets (80 mg total) by mouth daily. 04/02/18   Plotnikov, Evie Lacks, MD  cholecalciferol (VITAMIN D) 1000 units tablet Take 1,000 Units by mouth daily.    [provider]  clopidogrel (PLAVIX) 75 MG tablet Take 1 tablet (75 mg total) by mouth daily. 10/21/17   Plotnikov, Evie Lacks, MD  colchicine 0.6 MG tablet Take 1 tablet (0.6 mg total) by mouth 4 (four) times daily as needed. 08/25/14   Plotnikov, Evie Lacks, MD  furosemide (LASIX) 20 MG tablet TAKE 1-2 TABLETS BY MOUTH  DAILY AS NEEDED FOR EDEMA. 04/18/15   Plotnikov, Evie Lacks, MD  isosorbide mononitrate (IMDUR) 30 MG 24 hr tablet Take 1 tablet (30 mg total) by mouth daily. 05/05/18 08/03/18  Lorretta Harp, MD  levothyroxine (SYNTHROID, LEVOTHROID) 50 MCG tablet Take 1 tablet (50 mcg total) by mouth daily. 08/21/17   Plotnikov, Evie Lacks, MD  losartan (COZAAR) 25 MG tablet Take 1 tablet (25 mg total) by mouth daily. 06/08/18 06/03/19  Erlene Quan, PA-C  metoprolol tartrate (LOPRESSOR) 25 MG tablet Take 0.5 tablets (12.5 mg total) by mouth 2 (two) times daily. 04/02/18   Plotnikov, Evie Lacks, MD  naproxen sodium (ALEVE) 220 MG tablet Take 440 mg by mouth as needed (back pain).    [provider]  nitroGLYCERIN (NITROSTAT) 0.4 MG SL tablet Place 0.4 mg under the tongue every 5 (five) minutes as needed for chest pain.    [provider]  OVER THE COUNTER MEDICATION Take 1 capsule by mouth daily. Eye vitamin     [provider]  tiZANidine (ZANAFLEX) 4 MG tablet Take 1 tablet (4 mg total) by mouth every 8 (eight) hours as needed for muscle spasms. 09/12/16   Plotnikov, Evie Lacks, MD  traMADol (ULTRAM) 50 MG tablet Take 1 tablet (50 mg total) by mouth every 8 (eight) hours as needed. 12/18/16   Plotnikov, Evie Lacks, MD     Allergies:    Allergies  Allergen Reactions  . Nabumetone Anaphylaxis and Other (See Comments)    Causes inflammation    Social History:   Social History   Socioeconomic History  . Marital status: Widowed  Spouse name: Not on file  . Number of children: 5  . Years of education: 9  . Highest education level: 9th grade  Occupational History  . Occupation: retired    Comment: retired  Scientific laboratory technician  . Financial resource strain: Not on file  . Food insecurity:    Worry: Not on file    Inability: Not on file  . Transportation needs:    Medical: Not on file    Non-medical: Not on file  Tobacco Use  . Smoking status: Current Every Day Smoker    Packs/day: 1.00    Years: 62.00    Pack years: 62.00    Types: Cigarettes  . Smokeless tobacco: Never Used  Substance and Sexual Activity  . Alcohol use: Yes    Alcohol/week: 0.0 standard drinks    Comment: occasionally - few drinks/month  . Drug use: No  . Sexual activity: Never    Partners: Male    Birth control/protection: None, Surgical  Lifestyle  . Physical activity:    Days per week: Not on file    Minutes per session: Not on file  . Stress: Not on file  Relationships  . Social connections:    Talks on phone: Not on file    Gets together: Not on file    Attends religious service: Not on file    Active member of club or organization: Not on file    Attends meetings of clubs or organizations: Not on file    Relationship status: Not on file  . Intimate partner violence:    Fear of current or ex partner: Not on file    Emotionally abused: Not on file    Physically abused: Not on file    Forced  sexual activity: Not on file  Other Topics Concern  . Not on file  Social History Narrative   9th grade. married '54- widowed Dec 10th'08. 4 sons - '55, '58, '60, '69; 1 daughter - '63; 12 grandchildren; 9 great-grandchildren. 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 Other children are close and attentive. ACP/End-of-life: No CPR, no mechanical ventilation, no futile/heroic measures.    Family History:  The patient's family history includes Aneurysm in her mother; Cancer in her maternal aunt; Diabetes in her sister; Heart attack in her father; Heart disease in her father; Hyperlipidemia in her mother and sister; Hypertension in her mother and sister. There is no history of Colon cancer or Stomach cancer.    ROS:  Please see the history of present illness. No fever, no SOB, no chills, no nausea.  All other ROS reviewed and negative.     Physical Exam/Data:   Vitals:   08/31/18 1146 08/31/18 1147  BP:  (!) 160/85  Pulse:  (!) 54  Resp:  12  Temp:  97.6 F (36.4 C)  TempSrc:  Oral  SpO2:  98%  Weight: 64.4 kg   Height: 5' 3"  (1.6 m)    No intake or output data in the 24 hours ending 08/31/18 1508 Last 3 Weights 08/31/2018 07/07/2018 07/06/2018  Weight (lbs) 142 lb 143 lb 144 lb  Weight (kg) 64.411 kg 64.864 kg 65.318 kg     Body mass index is 25.15 kg/m.  General:  Well nourished, well developed, in no acute distress. She did wince when she sat up in bed.  HEENT: normal Lymph: no adenopathy Neck: no JVD Endocrine:  No thryomegaly Vascular: No carotid bruits; FA pulses 2+  bilaterally without bruits  Cardiac:  normal S1, S2; RRR; 3/6 SM murmur consistent with AS. Seen on echo.  Lungs:  clear to auscultation bilaterally, no wheezing, rhonchi or rales  Abd: soft, nontender, no hepatomegaly  Ext: no edema Musculoskeletal:  Right scapular tenderness to palpation (no abnormalities on CT 10/02/17). No deformities, BUE and BLE strength normal and  equal Skin: warm and dry  Neuro:  CNs 2-12 intact, no focal abnormalities noted Psych:  Normal affect     Relevant CV Studies:  AAA Doppler Limited 07/07/18 Summary: Abdominal Aorta: Previous diameter measurement was 4.9 cm obtained on 07/03/2017. There is evidence of abnormal dilitation of the mid to distal abdominal aorta. The largest aortic diamater is 4.50 x 4.94 cm. Limited visualiztion of the common iliac  Arteries.   Cardiac cath 01/25/2018 Diagnostic Summary 1. Acute occlusion of Mid Cir (STEMI) bifurcating with OM 2. Moderate lesions of Diag and RCA. 3. Moderate LV dysfunction with infroapical wall hypokinesis, LVEF 40-45% 4. Aortic valve mean gradient 25 mmHg Diagnostic Recommendations PCI to Cir. Interventional Summary Access switched to right femoral artery after diagnostic angiogram due to severe radial artery spasm, unable to advance the Guide catheter 1. Successful PCI / Resolute Onyx Drug Eluting Stent of the prox and mid Circumflex Coronary Artery. 2. Successful angioplasty to OM1 ostial. Interventional Recommendations Dual anti-platelet therapy with Ticagrelor and Aspirin 81 mg for at least 12 months is recommended.  Signatures  Electronically signed by Rozann Lesches, MD(Interventional Physician)  on 01/25/2018 15:10  Angiographic Findings  Cardiac Arteries and Lesion Findings LMCA: 0%. LAD:  Lesion on 1st Diag: Ostial.80% stenosis.  Lesion on Mid LAD: 60% stenosis. LCx:  Lesion on Mid CX:  Devices used  - Abbott 0.014" x 190cm BMW J-Tip PTCI Guidewire. Number of passes:  1.  - 2.5 mm X 8 mm Trek RX. 3 inflation(s) to a max pressure of: 12  atm.  - 2.5x8 Resolute Onyx. 1 inflation(s) to a max pressure of: 12 atm.  Lesion on Mid CX:  Lesion on 1st Ob Marg: Ostial.100% stenosis 4 mm length reduced to 0%. Pre  procedure TIMI 0 flow was noted. Post Procedure TIMI III flow was present.  Poor run off was present. The lesion was diagnosed as High  Risk (C).  Devices used  - Abbott 0.014" x 190cm BMW J-Tip PTCI Guidewire. Number of passes: 1.  - Abbott 0.014" x 190cm BMW J-Tip PTCI Guidewire. Number of passes: 1.  - 2.25x12 Euphora. 1 inflation(s) to a max pressure of: 8 atm.  Lesion on Prox CX: Distal subsection.100% stenosis 6 mm length reduced to  0%. Pre procedure TIMI 0 flow was noted. Post Procedure TIMI III flow was  present. Poor run off was present. The lesion was diagnosed as High Risk  (C).  Devices used  - 2.5x8 Resolute Onyx. 1 inflation(s) to a max pressure of: 12 atm.  Lesion on Mid CX: Proximal subsection.100% stenosis 6 mm length reduced to  0%. Pre procedure TIMI 0 flow was noted. Post Procedure TIMI III flow was  present. Poor run off was present. The lesion was diagnosed as High Risk  (C). RCA:  Lesion on Mid RCA: 60% stenosis. Procedure Data Procedure Date Date: 01/25/2018 Start: 10:22 Entry Locations  - Retrograde Percutaneous access was performed through the Right Femoral   artery. A 6 Fr sheath was inserted. Contrast Material  - Omnipaque189 ml Fluoroscopy Time: PCI: 13:00 minutes. Total: 13:00 minutes. Admission Data Admission Date: 01/25/2018  Coronary Tree  Dominance: Right VA LV function assessed [id number]. Ejection Fraction  - Method: LV gram. EF%: 40.  LVA Segment Contractility  1 - Normal    3 - Mild    5 - Severe   7 - Dyskinesis  hypokinesis   hypokinesis  2 - Hypokinesis 4 - Moderate  6 - Akinesis  8 - Aneurysm  hypokinesis  Hemodynamics  Condition: Rest  Heart Rate: 63 bpm Pressures +-----+-----------+ !Site !Pressure  ! +-----+-----------+ !AO  !120/70 (92)! +-----+-----------+ !LV  !114/13 ,23 ! +-----+-----------+ !AO  !92/44 (64) ! +-----+-----------+ !LV  !117/12 ,25 ! +-----+-----------+ Valve Gradients and Areas +------+----+----+----+-----+----+------+ !Valve !Peak!Mean!Area!Index!Flow!Source!  +------+----+----+----+-----+----+------+ !Aortic!25 !25 !  !   !  !   ! +------+----+----+----+-----+----+------+ !Aortic!25 !25 !  !   !  !   ! +------+----+----+----+-----+----+------+     Echo 12/2017 Study Conclusions  - Left ventricle: The cavity size was normal. There was mild   concentric hypertrophy. Systolic function was normal. The   estimated ejection fraction was in the range of 60% to 65%. Wall   motion was normal; there were no regional wall motion   abnormalities. Features are consistent with a pseudonormal left   ventricular filling pattern, with concomitant abnormal relaxation   and increased filling pressure (grade 2 diastolic dysfunction). - Aortic valve: Valve mobility was severely restricted. There was   severe stenosis. There was trivial regurgitation. Valve area   (VTI): 0.96 cm^2. - Mitral valve: Severely calcified annulus. Mildly thickened   leaflets . The findings are consistent with mild stenosis. Valve   area by pressure half-time: 1.61 cm^2. Valve area by continuity   equation (using LVOT flow): 1.65 cm^2. - Left atrium: The atrium was mildly dilated.  Laboratory Data:  Chemistry Recent Labs  Lab 08/31/18 1310  NA 139  K 4.1  CL 106  CO2 27  GLUCOSE 88  BUN 11  CREATININE 0.96  CALCIUM 9.1  GFRNONAA 55*  GFRAA >60  ANIONGAP 6    Recent Labs  Lab 08/31/18 1310  PROT 6.1*  ALBUMIN 3.6  AST 19  ALT 12  ALKPHOS 58  BILITOT 0.8   Hematology Recent Labs  Lab 08/31/18 1310  WBC 7.1  RBC 5.05  HGB 13.7  HCT 44.5  MCV 88.1  MCH 27.1  MCHC 30.8  RDW 14.6  PLT 226   Cardiac Enzymes  Radiology/Studies:  Dg Chest 2 View  Result Date: 08/31/2018 CLINICAL DATA:  Chest pain, RIGHT shoulder pain and RIGHT side back pain since Saturday, history stroke, coronary artery disease post MI and coronary stenting, hypertension, smoker EXAM: CHEST - 2 VIEW COMPARISON:  10/02/2017 FINDINGS: Enlargement of cardiac  silhouette and slight pulmonary vascular congestion. Mediastinal contours normal. Atherosclerotic calcification aorta. Emphysematous and minimal bronchitic changes consistent with COPD. No acute infiltrate, pleural effusion or pneumothorax. Diffuse osseous demineralization. IMPRESSION: Enlargement of cardiac silhouette with slight pulmonary vascular congestion. COPD changes without infiltrate. Electronically Signed   By: Lavonia Dana M.D.   On: 08/31/2018 13:03    Assessment and Plan:   1. Back/chest pain - Presenting with 3 days hx of constant back pain radiating to R chest. Exacerbates with movement with occasional radiating to R shoulder. Worried about Last MI in 2019 but much less intense. Did not improved with nitro. Did not want to take muscle relaxant.  Troponin negative. EKG shows TWI in lead V5 & V6 but this was likely present on ECG 01/25/18 at [facility] where interpretation was T wave abnormality in  lateral leads. My suspicion is that the one today is very similar.   - Given the atypical nature, duplication of pain upon palpation of right scapular region, and normal troponin in the setting of CP/ back pain for 3 days, I would be comfortable sending her home with second troponin I if normal. Spoke to Dr. Laren Everts about this plan. She can have reasonable follow up with Dr. Gwenlyn Found.   2. CAD  - Last cath 01/2018 as above s/p successful PCI / Resolute Onyx Drug Eluting Stent of the prox and midCircumflex Coronary Artery and  Successful angioplasty to OM1 ostial. Medical therapy for residual Moderate lesions of Diag and RCA. - On ASA and Plavix. No change.   3. AAA - Per Dr. Donnetta Hutching 07/07/18 "CT showed that this went up to her renal arteries making her not a  candidate for stent graft."   - The largest aortic diamater is 4.50 x 4.94 cm.  4. HTN - minimally elevated. Follow   5. HLD - 04/02/2018: Cholesterol 121; HDL 31.50; LDL Cholesterol 70; Triglycerides 95.0; VLDL 19.0  - On statin   6. Aortic  stenosis   - 3/6 SM on exam. Prior ECHO with mean gradient of 75mHg. Certainly typical angina can be associated with severe aortic stenosis. Today her symptoms sound more MSK. Does have repeat ECHO in July sched. May be a future TAVR candidate.   Tobacco  Cessation.   For questions or updates, please contact CBraddockPlease consult [email or web address] for contact info under     Signed, MCandee Furbish MD

## 2018-08-31 NOTE — Telephone Encounter (Signed)
Received call from patient she stated she has been having chest pain for the 2 days.Stated she woke up this morning with pain in middle of back radiating into chest with pain radiating up into right shoulder.She rated pain # 7.Stated pain is similar to pain when she had heart stents.Advised to go to [facility] ED.Trish notified.

## 2018-08-31 NOTE — Discharge Instructions (Signed)
Please call your cardiologist tomorrow to schedule appointment for follow-up.  You may try the muscle relaxers that you have at home to help with your symptoms.  Be aware that they may make you very drowsy.  You should not drive while you are taking this medication.  Return to the emergency department for new or worsening symptoms in the meantime.

## 2018-08-31 NOTE — Progress Notes (Signed)
Discussed with Dr. Marlou Porch who  evaluated patient in the emergency room.  He advised repeating troponin and discharging the patient if the second troponin is negative.  Dr. Marlou Porch feels that this pain is musculoskeletal and patient needs to follow-up with her cardiologist after discharge Second troponin is ordered and patient can be discharged if continues to be low. I already called flow manager and informed her to cancel the discharge.

## 2018-08-31 NOTE — ED Triage Notes (Addendum)
Pt reports with right sided chest pressure that started about three days ago. Pt reports that the pain has been constant and does increase at times. Pt has a pmh of two previous MI's with the most recent being in June of 2019 with stent placement. Pt denies any shortness of breath, fevers, cough, nausea or vomiting. Pt reports 7/10 pain that radiates to her back and right shoulder. Pt reports she took 1 nitroglycerin tablet this morning with no relief.

## 2018-08-31 NOTE — Telephone Encounter (Signed)
° °  Patient calling to report chest discomfort , shoulder and back pain  1. Are you having CP right now? yes  2. Are you experiencing any other symptoms (ex. SOB, nausea, vomiting, sweating)? no  3. How long have you been experiencing CP? 2 days 4. Is your CP continuous or coming and going? Coming and going  5. Have you taken Nitroglycerin? Yes, on Saturday ?

## 2018-08-31 NOTE — ED Notes (Signed)
Pts son [name]: [phone] call for updates please

## 2018-08-31 NOTE — H&P (Signed)
[facility]                                                                                    Patient Demographics  Tammy Brooks, is a 85 y.o. female  CSN: [id number]  MRN: [id number]  DOB - [birth year]  Admit Date - 08/31/2018  Outpatient Primary MD for the patient is Plotnikov, Evie Lacks, MD   With History of -  Past Medical History:  Diagnosis Date  . Abdominal aortic aneurysm ([place])    followed by Dr. Sherren Mocha Early  . Adenomatous colon polyp 1998  . Arthritis   . Blind left eye   . CAD (coronary artery disease) 08/1995   50-55% second diag dz.; occluded OM  . Complication of anesthesia    "Blood pressure went down" with retinal surgery - no problems with previous surgeries  . Echocardiogram abnormal 03/2012   mild to mod AS, valve area1.58 cm2, mild concentric LVH, grade I diastolic dusfunction  . Gout   . Heart murmur   . Hyperlipidemia   . Hypertension   . MI (myocardial infarction) ([place]) 01/25/2018  . Myocardial infarction [facility])    mild/ age 56  . PAD (peripheral artery disease) ([facility])    mild to mod Rt ICA stenosis followed by doppler-02/05/12  . Pelvic mass   . Stroke [facility]) 2001   no residual problems  . Tobacco abuse   . Ulcer       Past Surgical History:  Procedure Laterality Date  . ABDOMINAL HYSTERECTOMY    . APPENDECTOMY    . CARDIAC CATHETERIZATION  08/1995   50-55% second diag lesion, occluded OM  . CATARACT EXTRACTION W/ INTRAOCULAR LENS  IMPLANT, BILATERAL    . COLONOSCOPY    . EYE SURGERY     lense implant removed/ blind in left eye  . LAPAROTOMY N/A 04/26/2014   Procedure: EXPLORATORY LAPAROTOMY/RESECTION OF PELVIC MASS;  Surgeon: Alvino Chapel, MD;  Location: [facility] ORS;  Service: Gynecology;  Laterality: N/A;  . OOPHORECTOMY     and fallopian tube  . POLYPECTOMY    . RETINAL DETACHMENT SURGERY  2006  . TONSILLECTOMY AND ADENOIDECTOMY    . TUBAL LIGATION    . UPPER GASTROINTESTINAL ENDOSCOPY     treated for h-pylori     in for   Chief Complaint  Patient presents with  . Chest Pain     HPI  Tammy Brooks  is a 85 y.o. female, with past medical history significant for CAD status post MI, diastolic congestive heart failure, hypertension and hyperlipidemia presenting with 3 days history of right sided chest pain, squeezing like, nonradiating, not associated with shortness of breath, nausea or cold sweats.  Chest pain intensity was 7/10 and decreased now to 5/10.  It has been constant for the last 3 days In the emergency room her EKG showed new T wave inversions in anterolateral leads but her troponin was negative.  Chest x-ray showed mild congestive heart failure.   Review of Systems    In addition to the HPI above,  No Fever-chills, No Headache, No changes with Vision or hearing, No problems swallowing food or Liquids, No  Cough  or Shortness of Breath, No Abdominal pain, No Nausea or Vommitting, Bowel movements are regular, No Blood in stool or Urine, No dysuria, No new skin rashes or bruises, No new joints pains-aches,  No new weakness, tingling, numbness in any extremity, No recent weight gain or loss, No polyuria, polydypsia or polyphagia, No significant Mental Stressors.  A full 10 point Review of Systems was done, except as stated above, all other Review of Systems were negative.   Social History Social History   Tobacco Use  . Smoking status: Current Every Day Smoker    Packs/day: 1.00    Years: 62.00    Pack years: 62.00    Types: Cigarettes  . Smokeless tobacco: Never Used  Substance Use Topics  . Alcohol use: Yes    Alcohol/week: 0.0 standard drinks    Comment: occasionally - few drinks/month     Family History Family History  Problem Relation Age of Onset  . Aneurysm Mother        AAA  . Hypertension Mother   . Hyperlipidemia Mother   . Heart disease Father        CAD, MI x 3  . Heart attack Father   . Cancer Maternal Aunt        breast cancer  . Diabetes  Sister   . Hyperlipidemia Sister   . Hypertension Sister   . Colon cancer Neg Hx   . Stomach cancer Neg Hx      Prior to Admission medications   Medication Sig Start Date End Date Taking? Authorizing Provider  allopurinol (ZYLOPRIM) 100 MG tablet Take 1 tablet (100 mg total) by mouth daily. 08/21/17   Plotnikov, Evie Lacks, MD  amLODipine (NORVASC) 5 MG tablet Take 1 tablet (5 mg total) by mouth daily. 06/08/18 06/03/19  Erlene Quan, PA-C  aspirin EC 81 MG tablet Take 81 mg by mouth daily.    [provider]  atorvastatin (LIPITOR) 40 MG tablet Take 2 tablets (80 mg total) by mouth daily. 04/02/18   Plotnikov, Evie Lacks, MD  cholecalciferol (VITAMIN D) 1000 units tablet Take 1,000 Units by mouth daily.    [provider]  clopidogrel (PLAVIX) 75 MG tablet Take 1 tablet (75 mg total) by mouth daily. 10/21/17   Plotnikov, Evie Lacks, MD  colchicine 0.6 MG tablet Take 1 tablet (0.6 mg total) by mouth 4 (four) times daily as needed. 08/25/14   Plotnikov, Evie Lacks, MD  furosemide (LASIX) 20 MG tablet TAKE 1-2 TABLETS BY MOUTH  DAILY AS NEEDED FOR EDEMA. 04/18/15   Plotnikov, Evie Lacks, MD  isosorbide mononitrate (IMDUR) 30 MG 24 hr tablet Take 1 tablet (30 mg total) by mouth daily. 05/05/18 08/03/18  Lorretta Harp, MD  levothyroxine (SYNTHROID, LEVOTHROID) 50 MCG tablet Take 1 tablet (50 mcg total) by mouth daily. 08/21/17   Plotnikov, Evie Lacks, MD  losartan (COZAAR) 25 MG tablet Take 1 tablet (25 mg total) by mouth daily. 06/08/18 06/03/19  Erlene Quan, PA-C  metoprolol tartrate (LOPRESSOR) 25 MG tablet Take 0.5 tablets (12.5 mg total) by mouth 2 (two) times daily. 04/02/18   Plotnikov, Evie Lacks, MD  naproxen sodium (ALEVE) 220 MG tablet Take 440 mg by mouth as needed (back pain).    [provider]  nitroGLYCERIN (NITROSTAT) 0.4 MG SL tablet Place 0.4 mg under the tongue every 5 (five) minutes as needed for chest pain.    [provider]  OVER THE COUNTER  MEDICATION Take 1 capsule by mouth  daily. Eye vitamin    [provider]  tiZANidine (ZANAFLEX) 4 MG tablet Take 1 tablet (4 mg total) by mouth every 8 (eight) hours as needed for muscle spasms. 09/12/16   Plotnikov, Evie Lacks, MD  traMADol (ULTRAM) 50 MG tablet Take 1 tablet (50 mg total) by mouth every 8 (eight) hours as needed. 12/18/16   Plotnikov, Evie Lacks, MD    Allergies  Allergen Reactions  . Nabumetone Anaphylaxis and Other (See Comments)    Causes inflammation    Physical Exam  Vitals  Blood pressure (!) 160/85, pulse (!) 54, temperature 97.6 F (36.4 C), temperature source Oral, resp. rate 12, height 5\' 3"  (1.6 m), weight 64.4 kg, SpO2 98 %.   1. General extremely pleasant female, moderate distress  2. Normal affect and insight, Not Suicidal or Homicidal, Awake Alert, Oriented X 3.  3. No F.N deficits, grossly, patient moving all extremities.  4. Ears and Eyes appear Normal, Conjunctivae clear, PERRLA. Moist Oral Mucosa.  5. Supple Neck, No JVD, No cervical lymphadenopathy appriciated, No Carotid Bruits.  6. Symmetrical Chest wall movement, decreased breath sounds at the bases.  7. RRR, No Gallops, Rubs or Murmurs, No Parasternal Heave.  8. Positive Bowel Sounds, Abdomen Soft, Non tender, No organomegaly appriciated,No rebound -guarding or rigidity.  9.  No Cyanosis, Normal Skin Turgor, No Skin Rash or Bruise.  10. Good muscle tone,  joints appear normal , no effusions, Normal ROM.    Data Review  CBC Recent Labs  Lab 08/31/18 1310  WBC 7.1  HGB 13.7  HCT 44.5  PLT 226  MCV 88.1  MCH 27.1  MCHC 30.8  RDW 14.6  LYMPHSABS 1.8  MONOABS 0.4  EOSABS 0.1  BASOSABS 0.1   ------------------------------------------------------------------------------------------------------------------  Chemistries  Recent Labs  Lab 08/31/18 1310  NA 139  K 4.1  CL 106  CO2 27  GLUCOSE 88  BUN 11  CREATININE 0.96  CALCIUM 9.1  AST 19  ALT 12   ALKPHOS 58  BILITOT 0.8   ------------------------------------------------------------------------------------------------------------------ estimated creatinine clearance is 40.8 mL/min (by C-G formula based on SCr of 0.96 mg/dL). ------------------------------------------------------------------------------------------------------------------ No results for input(s): TSH, T4TOTAL, T3FREE, THYROIDAB in the last 72 hours.  Invalid input(s): FREET3   Coagulation profile No results for input(s): INR, PROTIME in the last 168 hours. ------------------------------------------------------------------------------------------------------------------- No results for input(s): DDIMER in the last 72 hours. -------------------------------------------------------------------------------------------------------------------  Cardiac Enzymes Recent Labs  Lab 08/31/18 1310  TROPONINI <0.03   ------------------------------------------------------------------------------------------------------------------ Invalid input(s): POCBNP   ---------------------------------------------------------------------------------------------------------------  Urinalysis    Component Value Date/Time   COLORURINE YELLOW 10/15/2017 1835   APPEARANCEUR CLEAR 10/15/2017 1835   LABSPEC 1.018 10/15/2017 1835   PHURINE 5.0 10/15/2017 1835   GLUCOSEU NEGATIVE 10/15/2017 1835   GLUCOSEU NEGATIVE 12/18/2016 1505   HGBUR NEGATIVE 10/15/2017 1835   BILIRUBINUR NEGATIVE 10/15/2017 [place] 10/15/2017 1835   PROTEINUR NEGATIVE 10/15/2017 1835   UROBILINOGEN 0.2 12/18/2016 1505   NITRITE NEGATIVE 10/15/2017 [place] 10/15/2017 1835    ----------------------------------------------------------------------------------------------------------------   Imaging results:   Dg Chest 2 View  Result Date: 08/31/2018 CLINICAL DATA:  Chest pain, RIGHT shoulder pain and RIGHT side back  pain since Saturday, history stroke, coronary artery disease post MI and coronary stenting, hypertension, smoker EXAM: CHEST - 2 VIEW COMPARISON:  10/02/2017 FINDINGS: Enlargement of cardiac silhouette and slight pulmonary vascular congestion. Mediastinal contours normal. Atherosclerotic calcification aorta. Emphysematous and minimal bronchitic changes consistent with COPD. No acute infiltrate, pleural effusion  or pneumothorax. Diffuse osseous demineralization. IMPRESSION: Enlargement of cardiac silhouette with slight pulmonary vascular congestion. COPD changes without infiltrate. Electronically Signed   By: Lavonia Dana M.D.   On: 08/31/2018 13:03    My personal review of EKG: Normal sinus rhythm at 54 bpm with old anteroseptal MI and T wave changes in anterolateral leads.  Assessment & Plan  Chest pain, negative troponin but 2 inversions in anterolateral leads Cardiology on consult Serial troponins Continue with aspirin, Lipitor and Imdur  Diastolic congestive heart failure by echo on 12/2017 Start Lasix IV  Peripheral vascular disease with history of AAA followed by Dr. Donnetta Hutching Continue with aspirin/Lipitor  Hypertension Continue with Norvasc  Hypothyroidism Continue with Synthroid   DVT Prophylaxis Lovenox  AM Labs Ordered, also please review Full Orders    Code Status full  Disposition Plan: Home  Time spent in minutes : 41 minutes  Condition GUARDED   @SIGNATURE @

## 2018-08-31 NOTE — ED Provider Notes (Addendum)
[facility] EMERGENCY DEPARTMENT Provider Note   CSN: [id number] Arrival date & time: 08/31/18  1128    History   Chief Complaint Chief Complaint  Patient presents with  . Chest Pain    HPI Tammy Brooks is a 85 y.o. female.     HPI   Pt is an 85 y/o female with a h/o AAA, CAD s/p MIx2, heart murmur, PAD, CVA, tobacco use, who presents to the ED today c/o right sided chest pain that began 3 days ago. Pain radiates to the right upper back. Pain has been constant. Rates pain 7/10. Pain feels like burning/stabbing. States that it feels worse when she lays down and improves when she is up moving and distracted. Denies pain with inspiration. Denies sob or cough. States that her sxs feel the same as when she had an MI in the past and had to have stents placed. Denies associated nausea, diaphoresis. No abd pain. No vomiting or diarrhea. No fevers. No BLE swelling.   Past Medical History:  Diagnosis Date  . Abdominal aortic aneurysm ([place])    followed by Dr. Sherren Mocha Early  . Adenomatous colon polyp 1998  . Arthritis   . Blind left eye   . CAD (coronary artery disease) 08/1995   50-55% second diag dz.; occluded OM  . Complication of anesthesia    "Blood pressure went down" with retinal surgery - no problems with previous surgeries  . Echocardiogram abnormal 03/2012   mild to mod AS, valve area1.58 cm2, mild concentric LVH, grade I diastolic dusfunction  . Gout   . Heart murmur   . Hyperlipidemia   . Hypertension   . MI (myocardial infarction) ([place]) 01/25/2018  . Myocardial infarction [facility])    mild/ age 58  . PAD (peripheral artery disease) (HCC)    mild to mod Rt ICA stenosis followed by doppler-02/05/12  . Pelvic mass   . Stroke [facility]) 2001   no residual problems  . Tobacco abuse   . Ulcer     Patient Active Problem List   Diagnosis Date Noted  . Chest pain 08/31/2018  . CRI (chronic renal insufficiency), stage 2 (mild) 06/29/2018  . Moderate to severe aortic  stenosis 06/08/2018  . History of ST elevation myocardial infarction (STEMI) 02/11/2018  . CAD S/P percutaneous coronary angioplasty 02/11/2018  . Weakness of extremity 10/21/2017  . Rash and nonspecific skin eruption 10/07/2017  . Tobacco dependence 08/21/2017  . Laceration of hand 04/09/2016  . Well adult exam 01/30/2016  . Actinic keratoses 01/30/2016  . Edema 11/30/2014  . Inguinal hernia 08/30/2014  . Low back pain 06/02/2014  . Bradycardia 06/02/2014  . Pelvic mass in female 04/26/2014  . Bilateral lower extremity edema 03/09/2013  . Hx of carotid artery stenosis, RT. -moderate, followed by Dr. Gwenlyn Found, Last dopplers 02/2012 03/09/2013  . Seasonal allergies 03/09/2013  . AAA (abdominal aortic aneurysm) without rupture ([place]) 03/04/2013  . Gout 03/04/2013  . Osteoarthritis 03/04/2013  . Need for prophylactic vaccination and inoculation against influenza 03/18/2012  . Chest pain, atypical 03/14/2012  . GLAUCOMA ASSOCIATED W/OTH ANT SEGMENT ANOMALIES 11/21/2009  . UNSPECIFIED URINARY INCONTINENCE 01/20/2008  . Dyslipidemia 10/08/2007  . Essential hypertension 10/08/2007  . Peripheral vascular disease ([place]) 10/08/2007  . H/O: CVA (cerebrovascular accident) 10/08/2007  . COLONIC POLYPS, HX OF 10/08/2007    Past Surgical History:  Procedure Laterality Date  . ABDOMINAL HYSTERECTOMY    . APPENDECTOMY    . CARDIAC CATHETERIZATION  08/1995  50-55% second diag lesion, occluded OM  . CATARACT EXTRACTION W/ INTRAOCULAR LENS  IMPLANT, BILATERAL    . COLONOSCOPY    . EYE SURGERY     lense implant removed/ blind in left eye  . LAPAROTOMY N/A 04/26/2014   Procedure: EXPLORATORY LAPAROTOMY/RESECTION OF PELVIC MASS;  Surgeon: Alvino Chapel, MD;  Location: [facility];  Service: Gynecology;  Laterality: N/A;  . OOPHORECTOMY     and fallopian tube  . POLYPECTOMY    . RETINAL DETACHMENT SURGERY  2006  . TONSILLECTOMY AND ADENOIDECTOMY    . TUBAL LIGATION    . UPPER GASTROINTESTINAL  ENDOSCOPY     treated for h-pylori     OB History   No obstetric history on file.      Home Medications    Prior to Admission medications   Medication Sig Start Date End Date Taking? Authorizing Provider  allopurinol (ZYLOPRIM) 100 MG tablet Take 1 tablet (100 mg total) by mouth daily. 08/21/17  Yes Plotnikov, Evie Lacks, MD  amLODipine (NORVASC) 5 MG tablet Take 1 tablet (5 mg total) by mouth daily. 06/08/18 06/03/19 Yes Kilroy, Luke K, PA-C  atorvastatin (LIPITOR) 40 MG tablet Take 2 tablets (80 mg total) by mouth daily. Patient taking differently: Take 80 mg by mouth at bedtime.  04/02/18  Yes Plotnikov, Evie Lacks, MD  cholecalciferol (VITAMIN D) 1000 units tablet Take 1,000 Units by mouth daily.   Yes [provider]  clopidogrel (PLAVIX) 75 MG tablet Take 1 tablet (75 mg total) by mouth daily. 10/21/17  Yes Plotnikov, Evie Lacks, MD  colchicine 0.6 MG tablet Take 1 tablet (0.6 mg total) by mouth 4 (four) times daily as needed. Patient taking differently: Take 0.6 mg by mouth 4 (four) times daily as needed (for gout flares).  08/25/14  Yes Plotnikov, Evie Lacks, MD  furosemide (LASIX) 20 MG tablet TAKE 1-2 TABLETS BY MOUTH  DAILY AS NEEDED FOR EDEMA. Patient taking differently: Take 20-40 mg by mouth daily as needed for edema.  04/18/15  Yes Plotnikov, Evie Lacks, MD  isosorbide mononitrate (IMDUR) 30 MG 24 hr tablet Take 1 tablet (30 mg total) by mouth daily. 05/05/18 08/31/18 Yes Lorretta Harp, MD  levothyroxine (SYNTHROID, LEVOTHROID) 50 MCG tablet Take 1 tablet (50 mcg total) by mouth daily. 08/21/17  Yes Plotnikov, Evie Lacks, MD  losartan (COZAAR) 25 MG tablet Take 1 tablet (25 mg total) by mouth daily. 06/08/18 06/03/19 Yes Kilroy, Luke K, PA-C  metoprolol tartrate (LOPRESSOR) 25 MG tablet Take 0.5 tablets (12.5 mg total) by mouth 2 (two) times daily. 04/02/18  Yes Plotnikov, Evie Lacks, MD  Multiple Vitamins-Minerals (PRESERVISION/LUTEIN) CAPS Take 1 capsule by mouth daily with  breakfast.   Yes [provider]  naproxen sodium (ALEVE) 220 MG tablet Take 440 mg by mouth 2 (two) times daily as needed (for back pain).    Yes [provider]  nitroGLYCERIN (NITROSTAT) 0.4 MG SL tablet Place 0.4 mg under the tongue every 5 (five) minutes as needed for chest pain.   Yes [provider]  tiZANidine (ZANAFLEX) 4 MG tablet Take 1 tablet (4 mg total) by mouth every 8 (eight) hours as needed for muscle spasms. 09/12/16  Yes Plotnikov, Evie Lacks, MD  traMADol (ULTRAM) 50 MG tablet Take 1 tablet (50 mg total) by mouth every 8 (eight) hours as needed. Patient taking differently: Take 50 mg by mouth every 8 (eight) hours as needed (FOR PAIN).  12/18/16  Yes Plotnikov, Evie Lacks, MD  Family History Family History  Problem Relation Age of Onset  . Aneurysm Mother        AAA  . Hypertension Mother   . Hyperlipidemia Mother   . Heart disease Father        CAD, MI x 3  . Heart attack Father   . Cancer Maternal Aunt        breast cancer  . Diabetes Sister   . Hyperlipidemia Sister   . Hypertension Sister   . Colon cancer Neg Hx   . Stomach cancer Neg Hx     Social History Social History   Tobacco Use  . Smoking status: Current Every Day Smoker    Packs/day: 1.00    Years: 62.00    Pack years: 62.00    Types: Cigarettes  . Smokeless tobacco: Never Used  Substance Use Topics  . Alcohol use: Yes    Alcohol/week: 0.0 standard drinks    Comment: occasionally - few drinks/month  . Drug use: No     Allergies   Nabumetone and Tape   Review of Systems Review of Systems  Constitutional: Negative for fever.  HENT: Negative for ear pain and sore throat.   Eyes: Negative for pain and visual disturbance.  Respiratory: Negative for cough and shortness of breath.   Cardiovascular: Positive for chest pain. Negative for palpitations.  Gastrointestinal: Negative for abdominal pain, constipation, diarrhea, nausea and vomiting.  Genitourinary:  Negative for flank pain.  Musculoskeletal: Positive for back pain.  Skin: Negative for color change and rash.  Neurological: Negative for dizziness, light-headedness, numbness and headaches.  All other systems reviewed and are negative.    Physical Exam Updated Vital Signs BP (!) 150/75   Pulse (!) 49   Temp 97.6 F (36.4 C) (Oral)   Resp 13   Ht 5\' 3"  (1.6 m)   Wt 64.4 kg   SpO2 95%   BMI 25.15 kg/m   Physical Exam Vitals signs and nursing note reviewed.  Constitutional:      General: She is not in acute distress.    Appearance: She is well-developed.  HENT:     Head: Normocephalic and atraumatic.  Eyes:     Conjunctiva/sclera: Conjunctivae normal.  Neck:     Musculoskeletal: Neck supple.  Cardiovascular:     Rate and Rhythm: Normal rate and regular rhythm.     Heart sounds: Murmur present.  Pulmonary:     Effort: Pulmonary effort is normal. No respiratory distress.     Breath sounds: No decreased breath sounds, wheezing, rhonchi or rales.     Comments: No chest wall ttp. No rashes to back or chest. Chest:     Chest wall: No tenderness or crepitus.  Abdominal:     Palpations: Abdomen is soft.     Tenderness: There is no abdominal tenderness.  Musculoskeletal:     Right lower leg: She exhibits no tenderness. No edema.     Left lower leg: She exhibits no tenderness. No edema.     Comments: TTP to the right upper back along the inferior scapula  Skin:    General: Skin is warm and dry.  Neurological:     Mental Status: She is alert.      ED Treatments / Results  Labs (all labs ordered are listed, but only abnormal results are displayed) Labs Reviewed  COMPREHENSIVE METABOLIC PANEL - Abnormal; Notable for the following components:      Result Value   Total Protein 6.1 (*)  GFR calc non Af Amer 55 (*)    All other components within normal limits  CBC WITH DIFFERENTIAL/PLATELET  TROPONIN I  TROPONIN I    EKG EKG Interpretation  Date/Time:  Monday  August 31 2018 13:31:51 EDT Ventricular Rate:  54 PR Interval:    QRS Duration: 113 QT Interval:  461 QTC Calculation: 437 R Axis:   -85 Text Interpretation:  Sinus rhythm Incomplete RBBB and LAFB Anteroseptal infarct, old Nonspecific T abnormalities, lateral leads Confirmed by Virgel Manifold [phone]) on 08/31/2018 1:37:13 PM   Radiology Dg Chest 2 View  Result Date: 08/31/2018 CLINICAL DATA:  Chest pain, RIGHT shoulder pain and RIGHT side back pain since Saturday, history stroke, coronary artery disease post MI and coronary stenting, hypertension, smoker EXAM: CHEST - 2 VIEW COMPARISON:  10/02/2017 FINDINGS: Enlargement of cardiac silhouette and slight pulmonary vascular congestion. Mediastinal contours normal. Atherosclerotic calcification aorta. Emphysematous and minimal bronchitic changes consistent with COPD. No acute infiltrate, pleural effusion or pneumothorax. Diffuse osseous demineralization. IMPRESSION: Enlargement of cardiac silhouette with slight pulmonary vascular congestion. COPD changes without infiltrate. Electronically Signed   By: Lavonia Dana M.D.   On: 08/31/2018 13:03    Procedures Procedures (including critical care time)  Medications Ordered in ED Medications  aspirin chewable tablet 324 mg (324 mg Oral Given 08/31/18 1312)     Initial Impression / Assessment and Plan / ED Course  I have reviewed the triage vital signs and the nursing notes.  Pertinent labs & imaging results that were available during my care of the patient were reviewed by me and considered in my medical decision making (see chart for details).     Final Clinical Impressions(s) / ED Diagnoses   Final diagnoses:  Atypical chest pain   85 y/o female with h/o CAD presenting with right sided cp radiating to the back that feels like previous mi.   HTN, marginally bradycardic, otherwise VS reassuring  CBC wnl CMP wnl Trop negative  EKG Sinus rhythm, Incomplete RBBB and LAFB, Anteroseptal  infarct, old, new nonspecific T abnormalities in the lateral leads  CXR with cardiomegaly and mild pulmonary vascular congestion, changes of copd without infiltrate.  Pt given full dose ASA.   Case discussed with Dr. Wilson Singer who recommends admission given pts hx and ekg changes.  Case discussed with Dr. Laren Everts who accepts pt for admission.   3:03 PM Consult with Dr. Sallyanne Kuster with cardiology. He will see the patient in the ED.  Dr. Laren Everts called to update. He spoke with Dr. Marlou Porch who evaluated the pt and believes sxs are msk in nature. He recommended repeat trop and if negative f/u with cards as outpt.  Pts repeat trop is negative. Pt discharged with instructions to f/u with her cardiologist. She has muscle relaxers at home that she states she will try. Advised on strict return precautions for any new or worsening symptoms. She voices understating of the plan and is in agreement. All questions answered.  ED Discharge Orders    None       Rodney Booze, PA-C 08/31/18 [address] [place], PA-C 08/31/18 1833    Virgel Manifold, MD 09/01/18 1046

## 2018-08-31 NOTE — ED Notes (Signed)
ED TO INPATIENT HANDOFF REPORT  ED Nurse Name and Phone #:  Chester Holstein - [phone]  S Name/Age/Gender Tammy Brooks 85 y.o. female Room/Bed: 045C/045C  Code Status   Code Status: Prior  Home/SNF/Other Home Patient oriented to: self, place, time and situation Is this baseline? Yes   Triage Complete: Triage complete  Chief Complaint chest/back pain  Triage Note Pt reports with right sided chest pressure that started about three days ago. Pt reports that the pain has been constant and does increase at times. Pt has a pmh of two previous MI's with the most recent being in June of 2019 with stent placement. Pt denies any shortness of breath, fevers, cough, nausea or vomiting. Pt reports 7/10 pain that radiates to her back and right shoulder. Pt reports she took 1 nitroglycerin tablet this morning with no relief.    Allergies Allergies  Allergen Reactions  . Nabumetone Anaphylaxis and Other (See Comments)    Causes inflammation    Level of Care/Admitting Diagnosis ED Disposition    ED Disposition Condition [facility] Area: [place] [100100]  Level of Care: Telemetry Cardiac [103]  I expect the patient will be discharged within 24 hours: Yes  LOW acuity---Tx typically complete <24 hrs---ACUTE conditions typically can be evaluated <24 hours---LABS likely to return to acceptable levels <24 hours---IS near functional baseline---EXPECTED to return to current living arrangement---NOT newly hypoxic: Meets criteria for 5C-Observation unit  Diagnosis: Chest pain [244010]  Admitting Physician: Laren Everts, [place] Marshal.[place]  Attending Physician: Laren Everts, ALI Marshal.B[place]ner  PT Class (Do Not Modify): Observation [104]  PT Acc Code (Do Not Modify): Observation [10022]       B Medical/Surgery History Past Medical History:  Diagnosis Date  . Abdominal aortic aneurysm ([place])    followed by Dr. Sherren Mocha Early  . Adenomatous colon polyp 1998  . Arthritis   . Blind left eye   . CAD  (coronary artery disease) 08/1995   50-55% second diag dz.; occluded OM  . Complication of anesthesia    "Blood pressure went down" with retinal surgery - no problems with previous surgeries  . Echocardiogram abnormal 03/2012   mild to mod AS, valve area1.58 cm2, mild concentric LVH, grade I diastolic dusfunction  . Gout   . Heart murmur   . Hyperlipidemia   . Hypertension   . MI (myocardial infarction) ([place]) 01/25/2018  . Myocardial infarction [facility])    mild/ age 89  . PAD (peripheral artery disease) ([facility])    mild to mod Rt ICA stenosis followed by doppler-02/05/12  . Pelvic mass   . Stroke [facility]) 2001   no residual problems  . Tobacco abuse   . Ulcer    Past Surgical History:  Procedure Laterality Date  . ABDOMINAL HYSTERECTOMY    . APPENDECTOMY    . CARDIAC CATHETERIZATION  08/1995   50-55% second diag lesion, occluded OM  . CATARACT EXTRACTION W/ INTRAOCULAR LENS  IMPLANT, BILATERAL    . COLONOSCOPY    . EYE SURGERY     lense implant removed/ blind in left eye  . LAPAROTOMY N/A 04/26/2014   Procedure: EXPLORATORY LAPAROTOMY/RESECTION OF PELVIC MASS;  Surgeon: Alvino Chapel, MD;  Location: [facility];  Service: Gynecology;  Laterality: N/A;  . OOPHORECTOMY     and fallopian tube  . POLYPECTOMY    . RETINAL DETACHMENT SURGERY  2006  . TONSILLECTOMY AND ADENOIDECTOMY    . TUBAL LIGATION    . UPPER GASTROINTESTINAL  ENDOSCOPY     treated for h-pylori     A IV Location/Drains/Wounds Patient Lines/Drains/Airways Status   Active Line/Drains/Airways    Name:   Placement date:   Placement time:   Site:   Days:   Peripheral IV 08/31/18 Right Antecubital   08/31/18    1320    Antecubital   less than 1   Incision (Closed) 04/26/14 Abdomen   04/26/14    1013     1588          Intake/Output Last 24 hours No intake or output data in the 24 hours ending 08/31/18 1610  Labs/Imaging Results for orders placed or performed during the hospital encounter of 08/31/18 (from the  past 48 hour(s))  CBC with Differential     Status: None   Collection Time: 08/31/18  1:10 PM  Result Value Ref Range   WBC 7.1 4.0 - 10.5 K/uL   RBC 5.05 3.87 - 5.11 MIL/uL   Hemoglobin 13.7 12.0 - 15.0 g/dL   HCT 44.5 36.0 - 46.0 %   MCV 88.1 80.0 - 100.0 fL   MCH 27.1 26.0 - 34.0 pg   MCHC 30.8 30.0 - 36.0 g/dL   RDW 14.6 11.5 - 15.5 %   Platelets 226 150 - 400 K/uL   nRBC 0.0 0.0 - 0.2 %   Neutrophils Relative % 67 %   Neutro Abs 4.7 1.7 - 7.7 K/uL   Lymphocytes Relative 25 %   Lymphs Abs 1.8 0.7 - 4.0 K/uL   Monocytes Relative 5 %   Monocytes Absolute 0.4 0.1 - 1.0 K/uL   Eosinophils Relative 2 %   Eosinophils Absolute 0.1 0.0 - 0.5 K/uL   Basophils Relative 1 %   Basophils Absolute 0.1 0.0 - 0.1 K/uL   Immature Granulocytes 0 %   Abs Immature Granulocytes 0.02 0.00 - 0.07 K/uL    Comment: Performed at [facility], [address], [place], [place] [address]  Comprehensive metabolic panel     Status: Abnormal   Collection Time: 08/31/18  1:10 PM  Result Value Ref Range   Sodium 139 135 - 145 mmol/L   Potassium 4.1 3.5 - 5.1 mmol/L   Chloride 106 98 - 111 mmol/L   CO2 27 22 - 32 mmol/L   Glucose, Bld 88 70 - 99 mg/dL   BUN 11 8 - 23 mg/dL   Creatinine, Ser 0.96 0.44 - 1.00 mg/dL   Calcium 9.1 8.9 - 10.3 mg/dL   Total Protein 6.1 (L) 6.5 - 8.1 g/dL   Albumin 3.6 3.5 - 5.0 g/dL   AST 19 15 - 41 U/L   ALT 12 0 - 44 U/L   Alkaline Phosphatase 58 38 - 126 U/L   Total Bilirubin 0.8 0.3 - 1.2 mg/dL   GFR calc non Af Amer 55 (L) >60 mL/min   GFR calc Af Amer >60 >60 mL/min   Anion gap 6 5 - 15    Comment: Performed at [facility] [address], [place], [place] [address]  Troponin I - ONCE - STAT     Status: None   Collection Time: 08/31/18  1:10 PM  Result Value Ref Range   Troponin I <0.03 <0.03 ng/mL    Comment: Performed at [facility] [address] [place], [place] [address]   Dg Chest 2 View  Result Date: 08/31/2018 CLINICAL DATA:   Chest pain, RIGHT shoulder pain and RIGHT side back pain since Saturday,  history stroke, coronary artery disease post MI and coronary stenting, hypertension, smoker EXAM: CHEST - 2 VIEW COMPARISON:  10/02/2017 FINDINGS: Enlargement of cardiac silhouette and slight pulmonary vascular congestion. Mediastinal contours normal. Atherosclerotic calcification aorta. Emphysematous and minimal bronchitic changes consistent with COPD. No acute infiltrate, pleural effusion or pneumothorax. Diffuse osseous demineralization. IMPRESSION: Enlargement of cardiac silhouette with slight pulmonary vascular congestion. COPD changes without infiltrate. Electronically Signed   By: Lavonia Dana M.D.   On: 08/31/2018 13:03    Pending Labs Unresulted Labs (From admission, onward)    Start     Ordered   Signed and Held  Troponin I - Now Then Q6H  Now then every 6 hours,   R     Signed and Held          Vitals/Pain Today's Vitals   08/31/18 1445 08/31/18 1500 08/31/18 1515 08/31/18 1530  BP: (!) 132/102 (!) 158/82 (!) 142/77 (!) 150/75  Pulse: 60 (!) 52 (!) 57 (!) 49  Resp: (!) 21 16 16 13   Temp:      TempSrc:      SpO2: 94% 98% 98% 95%  Weight:      Height:        Isolation Precautions No active isolations  Medications Medications  aspirin chewable tablet 324 mg (324 mg Oral Given 08/31/18 1312)    Mobility walks Low fall risk   Focused Assessments Cardiac Assessment Handoff:  Cardiac Rhythm: Normal sinus rhythm Lab Results  Component Value Date   CKTOTAL 44 12/18/2016   TROPONINI <0.03 08/31/2018   No results found for: DDIMER Does the Patient currently have chest pain? No     R Recommendations: See Admitting Provider Note  Report given to:   Additional Notes:   Patient is pain free at present.  Troponins negative

## 2018-09-01 ENCOUNTER — Telehealth: Payer: Self-pay | Admitting: *Deleted

## 2018-09-01 NOTE — Telephone Encounter (Signed)
Pt was on TCM report admitted 08/31/18 for observation c/o right sided cp radiating to the back that feels like previous MI. EKG showed Sinus rhythm, Incomplete RBBB and LAFB, Anteroseptal infarct, old, new nonspecific T abnormalities in the lateral leads and CXR with cardiomegaly and mild pulmonary vascular congestion, changes of copd without infiltrate. Pts repeat trop is negative. Pt discharged 09/01/18 with instructions to f/u with her cardiologist../lmb

## 2018-09-08 ENCOUNTER — Telehealth: Payer: Self-pay

## 2018-09-08 NOTE — Telephone Encounter (Signed)
PATIENT AGREED TO A TELEPHONE VISIT.       Virtual Visit Pre-Appointment Phone Call  Steps For Call:  1. Confirm consent - "In the setting of the current Covid19 crisis, you are scheduled for a (phone or video) visit with your provider on (09/09/18) at (9:15AM).  Just as we do with many in-office visits, in order for you to participate in this visit, we must obtain consent.  If you'd like, I can send this to your mychart (if signed up) or email for you to review.  Otherwise, I can obtain your verbal consent now.  All virtual visits are billed to your insurance company just like a normal visit would be.  By agreeing to a virtual visit, we'd like you to understand that the technology does not allow for your provider to perform an examination, and thus may limit your provider's ability to fully assess your condition.  Finally, though the technology is pretty good, we cannot assure that it will always work on either your or our end, and in the setting of a video visit, we may have to convert it to a phone-only visit.  In either situation, we cannot ensure that we have a secure connection.  Are you willing to proceed?"      TELEPHONE CALL NOTE  Tammy Brooks has been deemed a candidate for a follow-up tele-health visit to limit community exposure during the Covid-19 pandemic. I spoke with the patient via phone to ensure availability of phone/video source, confirm preferred email & phone number, and discuss instructions and expectations.  I reminded Tammy Brooks to be prepared with any vital sign and/or heart rhythm information that could potentially be obtained via home monitoring, at the time of her visit. I reminded Tammy Brooks to expect a phone call at the time of her visit if her visit.  Did the patient verbally acknowledge consent to treatment? YES  [name], CMA 09/08/2018 2:37 PM   DOWNLOADING THE [place], go to [employer] and type in WebEx in the  search bar. [place] [employer], the blue/green circle. The app is free but as with any other app downloads, their phone may require them to verify saved payment information or Apple password. The patient does NOT have to create an account.  - If Android, ask patient to go to [employer] and type in WebEx in the search bar. [place] [employer], the blue/green circle. The app is free but as with any other app downloads, their phone may require them to verify saved payment information or Android password. The patient does NOT have to create an account.   CONSENT FOR TELE-HEALTH VISIT - PLEASE REVIEW  I hereby voluntarily request, consent and authorize [facility] and its employed or contracted physicians, physician assistants, nurse practitioners or other licensed health care professionals (the Practitioner), to provide me with telemedicine health care services (the Services") as deemed necessary by the treating Practitioner. I acknowledge and consent to receive the Services by the Practitioner via telemedicine. I understand that the telemedicine visit will involve communicating with the Practitioner through live audiovisual communication technology and the disclosure of certain medical information by electronic transmission. I acknowledge that I have been given the opportunity to request an in-person assessment or other available alternative prior to the telemedicine visit and am voluntarily participating in the telemedicine visit.  I understand that I have the right to withhold or withdraw my consent  to the use of telemedicine in the course of my care at any time, without affecting my right to future care or treatment, and that the Practitioner or I may terminate the telemedicine visit at any time. I understand that I have the right to inspect all information obtained and/or recorded in the course of the telemedicine visit and may receive copies of available information for a  reasonable fee.  I understand that some of the potential risks of receiving the Services via telemedicine include:   Delay or interruption in medical evaluation due to technological equipment failure or disruption;  Information transmitted may not be sufficient (e.g. poor resolution of images) to allow for appropriate medical decision making by the Practitioner; and/or   In rare instances, security protocols could fail, causing a breach of personal health information.  Furthermore, I acknowledge that it is my responsibility to provide information about my medical history, conditions and care that is complete and accurate to the best of my ability. I acknowledge that Practitioner's advice, recommendations, and/or decision may be based on factors not within their control, such as incomplete or inaccurate data provided by me or distortions of diagnostic images or specimens that may result from electronic transmissions. I understand that the practice of medicine is not an exact science and that Practitioner makes no warranties or guarantees regarding treatment outcomes. I acknowledge that I will receive a copy of this consent concurrently upon execution via email to the email address I last provided but may also request a printed copy by calling the office of [facility].    I understand that my insurance will be billed for this visit.   I have read or had this consent read to me.  I understand the contents of this consent, which adequately explains the benefits and risks of the Services being provided via telemedicine.   I have been provided ample opportunity to ask questions regarding this consent and the Services and have had my questions answered to my satisfaction.  I give my informed consent for the services to be provided through the use of telemedicine in my medical care  By participating in this telemedicine visit I agree to the above.

## 2018-09-09 ENCOUNTER — Telehealth: Payer: Self-pay

## 2018-09-09 ENCOUNTER — Telehealth (INDEPENDENT_AMBULATORY_CARE_PROVIDER_SITE_OTHER): Payer: Medicare Other | Admitting: Cardiovascular Disease

## 2018-09-09 DIAGNOSIS — Z9861 Coronary angioplasty status: Secondary | ICD-10-CM

## 2018-09-09 DIAGNOSIS — I350 Nonrheumatic aortic (valve) stenosis: Secondary | ICD-10-CM | POA: Diagnosis not present

## 2018-09-09 DIAGNOSIS — E785 Hyperlipidemia, unspecified: Secondary | ICD-10-CM | POA: Diagnosis not present

## 2018-09-09 DIAGNOSIS — R0789 Other chest pain: Secondary | ICD-10-CM

## 2018-09-09 DIAGNOSIS — I2510 Atherosclerotic heart disease of native coronary artery without angina pectoris: Secondary | ICD-10-CM

## 2018-09-09 DIAGNOSIS — I10 Essential (primary) hypertension: Secondary | ICD-10-CM

## 2018-09-09 DIAGNOSIS — F17200 Nicotine dependence, unspecified, uncomplicated: Secondary | ICD-10-CM

## 2018-09-09 NOTE — Patient Instructions (Addendum)
Medication Instructions:  Your physician recommends that you continue on your current medications as directed. Please refer to the Current Medication list given to you today.  If you need a refill on your cardiac medications before your next appointment, please call your pharmacy.   Lab work: .NONE If you have labs (blood work) drawn today and your tests are completely normal, you will receive your results only by: Marland Kitchen MyChart Message (if you have MyChart) OR . A paper copy in the mail If you have any lab test that is abnormal or we need to change your treatment, we will call you to review the results.  Testing/Procedures: Your physician has requested that you have an echocardiogram. Echocardiography is a painless test that uses sound waves to create images of your heart. It provides your doctor with information about the size and shape of your heart and how well your heart's chambers and valves are working. This procedure takes approximately one hour. There are no restrictions for this procedure. Location: [facility], [place], [place] [address]   This has been scheduled for December 02, 2018 at 10:30 am  Follow-Up: At [facility], you and your health needs are our priority.  As part of our continuing mission to provide you with exceptional heart care, we have created designated Provider Care Teams.  These Care Teams include your primary Cardiologist (physician) and Advanced Practice Providers (APPs -  Physician Assistants and Nurse Practitioners) who all work together to provide you with the care you need, when you need it. You will need a follow up appointment WITH DR. Gwenlyn Found after your echocardiogram is completed. Our office will contact you to set up this appointment.

## 2018-09-09 NOTE — Telephone Encounter (Signed)
Patient and/or DPR-approved person aware of AVS instructions and verbalized understanding.

## 2018-09-09 NOTE — Progress Notes (Signed)
Virtual Visit via Telephone Note   This visit type was conducted due to national recommendations for restrictions regarding the COVID-19 Pandemic (e.g. social distancing) in an effort to limit this patient's exposure and mitigate transmission in our community.  Due to her co-morbid illnesses, this patient is at least at moderate risk for complications without adequate follow up.  This format is felt to be most appropriate for this patient at this time.  The patient did not have access to video technology/had technical difficulties with video requiring transitioning to audio format only (telephone).  All issues noted in this document were discussed and addressed.  No physical exam could be performed with this format.  Please refer to the patient's chart for her  consent to telehealth for [facility].   Evaluation Performed:  Follow-up visit  Date:  09/09/2018   ID:  Tammy Brooks, DOB 1933/10/27, MRN [id number]  Patient Location: Home  Provider Location: Home  PCP:  Plotnikov, Evie Lacks, MD  Cardiologist:  Quay Burow, MD  Electrophysiologist:  None   Chief Complaint: Atypical chest pain, post ER follow-up  History of Present Illness:    Tammy Brooks is a 85 y.o. female who presents via audio/video conferencing for a telehealth visit today.    Tammy Brooks is a 85 y.o.  mildly overweight widowed Caucasian female mother of 30, grandmother of 63 grandchildren well last saw  03/03/2018. She has a history of mild CAD by cath in 1997. Her lipid problems include continued tobacco abuse one pack per day, hypertension, and hyperlipidemia all medically treated. She does have mild to moderate aortic stenosis with valve area of 1 cm2 by 2-D echo one year ago.. She also has moderate right internal carotid artery stenosis by duplex ultrasound. She was recently diagnosed with a small abdominal aortic and iliac aneurysm followed by Dr. Donnetta Hutching. She denies chest pain or shortness of breath. She was found to  have a pelvic mass which was surgically excised last year and found to be benign. She does have a hernia as result of this. She has been seen in the emergency room once for chest pain and rule out PE. CTA was negative for this although it did show coronary calcification consistent with atherosclerosis. She was scheduled for a Myoview stress test which has been delayed until September because of fiscalconstraints. She does continue to smoke a pack a day.   Since I saw her 6 months ago, she did see Kerin Ransom in the office on 06/08/2018 because of elevated blood pressure as well as 3 weeks later on 06/29/2018.  Apparently her amlodipine and losartan were discontinued during her August hospitalization at [facility] regional [facility] at the time of her STEMI because of hypotension.  Amlodipine and losartan were added back her blood pressures have been under better control.  She was seen in the emergency room on 08/31/2018 with atypical right-sided chest pain and back pain.  Dr. Marlou Porch consulted who felt that her symptoms were more musculoskeletal.  Her EKG showed nonspecific changes and her troponins were low and flat.  She was ultimately discharged home on muscle relaxants.  When I spoke to her on the phone today her symptoms have markedly improved and have improved even more on the muscle relaxant.  She denies shortness of breath.  She does continue to smoke a half a pack a day down from a pack a day from August.  The patient does not have symptoms concerning for COVID-19 infection (fever,  chills, cough, or new shortness of breath).    Past Medical History:  Diagnosis Date   Abdominal aortic aneurysm (HCC)    followed by Dr. Sherren Mocha Early   Adenomatous colon polyp 1998   Arthritis    Blind left eye    CAD (coronary artery disease) 08/1995   50-55% second diag dz.; occluded OM   Complication of anesthesia    "Blood pressure went down" with retinal surgery - no problems with previous surgeries     Echocardiogram abnormal 03/2012   mild to mod AS, valve area1.58 cm2, mild concentric LVH, grade I diastolic dusfunction   Gout    Heart murmur    Hyperlipidemia    Hypertension    MI (myocardial infarction) ([place]) 01/25/2018   Myocardial infarction [facility])    mild/ age 45   PAD (peripheral artery disease) ([place])    mild to mod Rt ICA stenosis followed by doppler-02/05/12   Pelvic mass    Stroke ([place]) 2001   no residual problems   Tobacco abuse    Ulcer    Past Surgical History:  Procedure Laterality Date   ABDOMINAL HYSTERECTOMY     APPENDECTOMY     CARDIAC CATHETERIZATION  08/1995   50-55% second diag lesion, occluded OM   CATARACT EXTRACTION W/ INTRAOCULAR LENS  IMPLANT, BILATERAL     COLONOSCOPY     EYE SURGERY     lense implant removed/ blind in left eye   LAPAROTOMY N/A 04/26/2014   Procedure: EXPLORATORY LAPAROTOMY/RESECTION OF PELVIC MASS;  Surgeon: Alvino Chapel, MD;  Location: [facility] ORS;  Service: Gynecology;  Laterality: N/A;   OOPHORECTOMY     and fallopian tube   POLYPECTOMY     RETINAL DETACHMENT SURGERY  2006   TONSILLECTOMY AND ADENOIDECTOMY     TUBAL LIGATION     UPPER GASTROINTESTINAL ENDOSCOPY     treated for h-pylori     Current Meds  Medication Sig   allopurinol (ZYLOPRIM) 100 MG tablet Take 1 tablet (100 mg total) by mouth daily.   amLODipine (NORVASC) 5 MG tablet Take 1 tablet (5 mg total) by mouth daily.   atorvastatin (LIPITOR) 40 MG tablet Take 2 tablets (80 mg total) by mouth daily. (Patient taking differently: Take 80 mg by mouth at bedtime. )   cholecalciferol (VITAMIN D) 1000 units tablet Take 1,000 Units by mouth daily.   clopidogrel (PLAVIX) 75 MG tablet Take 1 tablet (75 mg total) by mouth daily.   colchicine 0.6 MG tablet Take 1 tablet (0.6 mg total) by mouth 4 (four) times daily as needed. (Patient taking differently: Take 0.6 mg by mouth 4 (four) times daily as needed (for gout flares). )    furosemide (LASIX) 20 MG tablet TAKE 1-2 TABLETS BY MOUTH  DAILY AS NEEDED FOR EDEMA. (Patient taking differently: Take 20-40 mg by mouth daily as needed for edema. )   levothyroxine (SYNTHROID, LEVOTHROID) 50 MCG tablet Take 1 tablet (50 mcg total) by mouth daily.   losartan (COZAAR) 25 MG tablet Take 1 tablet (25 mg total) by mouth daily.   metoprolol tartrate (LOPRESSOR) 25 MG tablet Take 0.5 tablets (12.5 mg total) by mouth 2 (two) times daily.   Multiple Vitamins-Minerals (PRESERVISION/LUTEIN) CAPS Take 1 capsule by mouth daily with breakfast.   naproxen sodium (ALEVE) 220 MG tablet Take 440 mg by mouth 2 (two) times daily as needed (for back pain).    nitroGLYCERIN (NITROSTAT) 0.4 MG SL tablet Place 0.4 mg under the tongue every 5 (  five) minutes as needed for chest pain.   tiZANidine (ZANAFLEX) 4 MG tablet Take 1 tablet (4 mg total) by mouth every 8 (eight) hours as needed for muscle spasms.   traMADol (ULTRAM) 50 MG tablet Take 1 tablet (50 mg total) by mouth every 8 (eight) hours as needed. (Patient taking differently: Take 50 mg by mouth every 8 (eight) hours as needed (FOR PAIN). )     Allergies:   Nabumetone and Tape   Social History   Tobacco Use   Smoking status: Current Every Day Smoker    Packs/day: 1.00    Years: 62.00    Pack years: 62.00    Types: Cigarettes   Smokeless tobacco: Never Used  Substance Use Topics   Alcohol use: Yes    Alcohol/week: 0.0 standard drinks    Comment: occasionally - few drinks/month   Drug use: No     Family Hx: The patient's family history includes Aneurysm in her mother; Cancer in her maternal aunt; Diabetes in her sister; Heart attack in her father; Heart disease in her father; Hyperlipidemia in her mother and sister; Hypertension in her mother and sister. There is no history of Colon cancer or Stomach cancer.  ROS:   Please see the history of present illness.     All other systems reviewed and are negative.   Prior CV  studies:   The following studies were reviewed today:  None reviewed today  Labs/Other Tests and Data Reviewed:    EKG:  An ECG dated 08/31/2018 was personally reviewed today and demonstrated:  Sinus bradycardia at 54 with right bundle branch block, left anterior fascicular block, anteroseptal myocardial infarction and lateral T wave inversion.  Recent Labs: 08/31/2018: ALT 12; BUN 11; Creatinine, Ser 0.96; Hemoglobin 13.7; Platelets 226; Potassium 4.1; Sodium 139   Recent Lipid Panel Lab Results  Component Value Date/Time   CHOL 121 04/02/2018 10:36 AM   CHOL 130 03/10/2013 08:49 AM   TRIG 95.0 04/02/2018 10:36 AM   HDL 31.50 (L) 04/02/2018 10:36 AM   HDL 30 (L) 03/10/2013 08:49 AM   CHOLHDL 4 04/02/2018 10:36 AM   LDLCALC 70 04/02/2018 10:36 AM   LDLCALC 66 03/10/2013 08:49 AM   LDLDIRECT 76.7 11/17/2008 03:33 PM    Wt Readings from Last 3 Encounters:  08/31/18 142 lb (64.4 kg)  07/07/18 143 lb (64.9 kg)  07/06/18 144 lb (65.3 kg)     Objective:    Vital Signs:  BP 139/73    Pulse 64    A full physical exam was not performed today since this was a telemedicine telephone call visit.  Her vital signs were stable.  ASSESSMENT & PLAN:    1. Coronary artery disease- history of CAD status post posterior STEMI at [facility] 01/25/2018 with stent placement in the circumflex and obtuse marginal branch by Dr. Sherrian Divers.  She had moderate diagonal and RCA branch disease which were treated medically.  Her EF at that time was 40 to 45% with subsequent EF shown to be in the normal range. 2. Moderate aortic stenosis- history of moderate aortic stenosis by 2D echo performed 12/16/2017 with a peak gradient of 62 mmHg and a valve area 0.96 cm.  She is scheduled for repeat 2D echo July 2020. 3. Peripheral arterial disease- history of abdominal aortic aneurysm followed by duplex ultrasound by Dr. early 4. Essential hypertension- blood pressure under good control on amlodipine,  losartan and metoprolol. 5. Hyperlipidemia- on statin therapy with lipid profile  performed 04/02/2018 revealing a total cholesterol 121, LDL 70 and HDL 31.  COVID-19 Education: The signs and symptoms of COVID-19 were discussed with the patient and how to seek care for testing (follow up with PCP or arrange E-visit).  The importance of social distancing was discussed today.  Time:   Today, I have spent 17 minutes with the patient with telehealth technology discussing the above problems.     Medication Adjustments/Labs and Tests Ordered: Current medicines are reviewed at length with the patient today.  Concerns regarding medicines are outlined above.  Tests Ordered: No orders of the defined types were placed in this encounter.  Medication Changes: No orders of the defined types were placed in this encounter.   Disposition:  Follow up in 4 month(s)  Signed, Quay Burow, MD  09/09/2018 9:47 AM    [place]

## 2018-10-09 ENCOUNTER — Other Ambulatory Visit: Payer: Self-pay

## 2018-10-09 NOTE — Patient Outreach (Signed)
[facility]) Care Management  10/09/2018  ADDILYNE BACKS [birth year] [id number]   Medication Adherence call to Mrs. [place] Compliant Voice message left with a call back number. Mrs. Hardie is showing past due on Losartan 25 mg under [place].   [place] Management Direct Dial [phone]  Fax [phone] [email or web address]

## 2018-11-03 ENCOUNTER — Other Ambulatory Visit: Payer: Self-pay

## 2018-11-03 ENCOUNTER — Other Ambulatory Visit: Payer: Self-pay | Admitting: Cardiovascular Disease

## 2018-11-03 MED ORDER — AMLODIPINE BESYLATE 5 MG PO TABS: 5.0000 mg | ORAL_TABLET | Freq: Every day | ORAL | 1 refills | Status: DC

## 2018-11-03 MED ORDER — LOSARTAN POTASSIUM 25 MG PO TABS: 25.0000 mg | ORAL_TABLET | Freq: Every day | ORAL | 1 refills | Status: DC

## 2018-11-03 NOTE — Telephone Encounter (Signed)
Rx(s) sent to pharmacy electronically.  

## 2018-11-05 ENCOUNTER — Other Ambulatory Visit: Payer: Self-pay

## 2018-11-05 ENCOUNTER — Ambulatory Visit (INDEPENDENT_AMBULATORY_CARE_PROVIDER_SITE_OTHER): Payer: Medicare Other | Admitting: Internal Medicine

## 2018-11-05 ENCOUNTER — Encounter: Payer: Self-pay | Admitting: Internal Medicine

## 2018-11-05 DIAGNOSIS — M10079 Idiopathic gout, unspecified ankle and foot: Secondary | ICD-10-CM

## 2018-11-05 DIAGNOSIS — I2510 Atherosclerotic heart disease of native coronary artery without angina pectoris: Secondary | ICD-10-CM | POA: Diagnosis not present

## 2018-11-05 DIAGNOSIS — Z9861 Coronary angioplasty status: Secondary | ICD-10-CM

## 2018-11-05 DIAGNOSIS — N182 Chronic kidney disease, stage 2 (mild): Secondary | ICD-10-CM | POA: Diagnosis not present

## 2018-11-05 DIAGNOSIS — I10 Essential (primary) hypertension: Secondary | ICD-10-CM

## 2018-11-05 MED ORDER — METHYLPREDNISOLONE ACETATE 80 MG/ML IJ SUSP
80.0000 mg | Freq: Once | INTRAMUSCULAR | Status: AC
  Administered 2018-11-05: 80 mg via INTRAMUSCULAR

## 2018-11-05 NOTE — Assessment & Plan Note (Addendum)
Amlodipine, Losartan, Toprol

## 2018-11-05 NOTE — Assessment & Plan Note (Signed)
Worse Depomedrol IM Allopurinol

## 2018-11-05 NOTE — Assessment & Plan Note (Addendum)
Toprol, Plavix, Lipitor, Amlodipine, Imdur

## 2018-11-05 NOTE — Progress Notes (Signed)
Subjective:  Patient ID: Tammy Brooks, female    DOB: 1933-10-13  Age: 85 y.o. MRN: [id number]  CC: No chief complaint on file.   HPI AILAH BARNA presents for COPD, HTN, dyslipidemia f/u  Outpatient Medications Prior to Visit  Medication Sig Dispense Refill  . allopurinol (ZYLOPRIM) 100 MG tablet Take 1 tablet (100 mg total) by mouth daily. 90 tablet 3  . amLODipine (NORVASC) 5 MG tablet Take 1 tablet (5 mg total) by mouth daily. 90 tablet 1  . atorvastatin (LIPITOR) 40 MG tablet Take 2 tablets (80 mg total) by mouth daily. (Patient taking differently: Take 80 mg by mouth at bedtime. ) 180 tablet 3  . cholecalciferol (VITAMIN D) 1000 units tablet Take 1,000 Units by mouth daily.    . clopidogrel (PLAVIX) 75 MG tablet Take 1 tablet (75 mg total) by mouth daily. 90 tablet 3  . colchicine 0.6 MG tablet Take 1 tablet (0.6 mg total) by mouth 4 (four) times daily as needed. (Patient taking differently: Take 0.6 mg by mouth 4 (four) times daily as needed (for gout flares). ) 360 tablet 2  . furosemide (LASIX) 20 MG tablet TAKE 1-2 TABLETS BY MOUTH  DAILY AS NEEDED FOR EDEMA. (Patient taking differently: Take 20-40 mg by mouth daily as needed for edema. ) 180 tablet 1  . isosorbide mononitrate (IMDUR) 30 MG 24 hr tablet TAKE 1 TABLET BY MOUTH  DAILY 90 tablet 2  . levothyroxine (SYNTHROID, LEVOTHROID) 50 MCG tablet Take 1 tablet (50 mcg total) by mouth daily. 90 tablet 3  . losartan (COZAAR) 25 MG tablet Take 1 tablet (25 mg total) by mouth daily. 90 tablet 1  . metoprolol tartrate (LOPRESSOR) 25 MG tablet Take 0.5 tablets (12.5 mg total) by mouth 2 (two) times daily. 90 tablet 3  . Multiple Vitamins-Minerals (PRESERVISION/LUTEIN) CAPS Take 1 capsule by mouth daily with breakfast.    . naproxen sodium (ALEVE) 220 MG tablet Take 440 mg by mouth 2 (two) times daily as needed (for back pain).     . nitroGLYCERIN (NITROSTAT) 0.4 MG SL tablet Place 0.4 mg under the tongue every 5 (five) minutes as  needed for chest pain.    Marland Kitchen tiZANidine (ZANAFLEX) 4 MG tablet Take 1 tablet (4 mg total) by mouth every 8 (eight) hours as needed for muscle spasms. 90 tablet 2  . traMADol (ULTRAM) 50 MG tablet Take 1 tablet (50 mg total) by mouth every 8 (eight) hours as needed. (Patient taking differently: Take 50 mg by mouth every 8 (eight) hours as needed (FOR PAIN). ) 100 tablet 2   No facility-administered medications prior to visit.     ROS: Review of Systems  Constitutional: Positive for fatigue. Negative for activity change, appetite change, chills and unexpected weight change.  HENT: Negative for congestion, mouth sores and sinus pressure.   Eyes: Negative for visual disturbance.  Respiratory: Positive for shortness of breath. Negative for cough and chest tightness.   Gastrointestinal: Negative for abdominal pain and nausea.  Genitourinary: Negative for difficulty urinating, frequency and vaginal pain.  Musculoskeletal: Positive for arthralgias, back pain, gait problem and neck pain.  Skin: Negative for pallor and rash.  Neurological: Negative for dizziness, tremors, weakness, numbness and headaches.  Psychiatric/Behavioral: Negative for confusion, sleep disturbance and suicidal ideas.    Objective:  BP (!) 148/86 (BP Location: Left Arm, Patient Position: Sitting, Cuff Size: Normal)   Pulse 74   Temp 97.9 F (36.6 C) (Oral)   Ht 5'  3" (1.6 m)   Wt 139 lb (63 kg)   SpO2 96%   BMI 24.62 kg/m   BP Readings from Last 3 Encounters:  11/05/18 (!) 148/86  09/09/18 139/73  08/31/18 (!) 157/94    Wt Readings from Last 3 Encounters:  11/05/18 139 lb (63 kg)  08/31/18 142 lb (64.4 kg)  07/07/18 143 lb (64.9 kg)    Physical Exam Constitutional:      General: She is not in acute distress.    Appearance: She is well-developed.  HENT:     Head: Normocephalic.     Right Ear: External ear normal.     Left Ear: External ear normal.     Nose: Nose normal.  Eyes:     General:         Right eye: No discharge.        Left eye: No discharge.     Conjunctiva/sclera: Conjunctivae normal.     Pupils: Pupils are equal, round, and reactive to light.  Neck:     Musculoskeletal: Normal range of motion and neck supple.     Thyroid: No thyromegaly.     Vascular: No JVD.     Trachea: No tracheal deviation.  Cardiovascular:     Rate and Rhythm: Normal rate and regular rhythm.     Heart sounds: Murmur present.  Pulmonary:     Effort: No respiratory distress.     Breath sounds: No stridor. No wheezing.  Abdominal:     General: Bowel sounds are normal. There is no distension.     Palpations: Abdomen is soft. There is no mass.     Tenderness: There is no abdominal tenderness. There is no guarding or rebound.  Musculoskeletal:        General: Tenderness present.  Lymphadenopathy:     Cervical: No cervical adenopathy.  Skin:    Findings: No erythema or rash.  Neurological:     Cranial Nerves: No cranial nerve deficit.     Motor: No abnormal muscle tone.     Coordination: Coordination normal.     Deep Tendon Reflexes: Reflexes normal.  Psychiatric:        Behavior: Behavior normal.        Thought Content: Thought content normal.        Judgment: Judgment normal.   decr BS B AKs bruises  Lab Results  Component Value Date   WBC 7.1 08/31/2018   HGB 13.7 08/31/2018   HCT 44.5 08/31/2018   PLT 226 08/31/2018   GLUCOSE 88 08/31/2018   CHOL 121 04/02/2018   TRIG 95.0 04/02/2018   HDL 31.50 (L) 04/02/2018   LDLDIRECT 76.7 11/17/2008   LDLCALC 70 04/02/2018   ALT 12 08/31/2018   AST 19 08/31/2018   NA 139 08/31/2018   K 4.1 08/31/2018   CL 106 08/31/2018   CREATININE 0.96 08/31/2018   BUN 11 08/31/2018   CO2 27 08/31/2018   TSH 5.54 (H) 08/21/2017    Dg Chest 2 View  Result Date: 08/31/2018 CLINICAL DATA:  Chest pain, RIGHT shoulder pain and RIGHT side back pain since Saturday, history stroke, coronary artery disease post MI and coronary stenting, hypertension,  smoker EXAM: CHEST - 2 VIEW COMPARISON:  10/02/2017 FINDINGS: Enlargement of cardiac silhouette and slight pulmonary vascular congestion. Mediastinal contours normal. Atherosclerotic calcification aorta. Emphysematous and minimal bronchitic changes consistent with COPD. No acute infiltrate, pleural effusion or pneumothorax. Diffuse osseous demineralization. IMPRESSION: Enlargement of cardiac silhouette with slight pulmonary vascular congestion. COPD  changes without infiltrate. Electronically Signed   By: Lavonia Dana M.D.   On: 08/31/2018 13:03    Assessment & Plan:   There are no diagnoses linked to this encounter.   No orders of the defined types were placed in this encounter.    Follow-up: No follow-ups on file.  Walker Kehr, MD

## 2018-11-05 NOTE — Addendum Note (Signed)
Addended by: Karren Cobble on: 11/05/2018 10:33 AM   Modules accepted: Orders

## 2018-11-05 NOTE — Assessment & Plan Note (Signed)
Labs

## 2018-11-06 ENCOUNTER — Telehealth: Payer: Self-pay | Admitting: Internal Medicine

## 2018-11-06 MED ORDER — ALLOPURINOL 100 MG PO TABS: 100.0000 mg | ORAL_TABLET | Freq: Every day | ORAL | 3 refills | Status: DC

## 2018-11-06 MED ORDER — CLOPIDOGREL BISULFATE 75 MG PO TABS: 75.0000 mg | ORAL_TABLET | Freq: Every day | ORAL | 3 refills | Status: DC

## 2018-11-06 NOTE — Telephone Encounter (Signed)
Copied from [place] [id number]. Topic: General - Other >> Nov 06, 2018  2:41 PM Lennox Solders wrote: Reason for CRM:Pt is calling and needs a refill on allopurinol and clopidogrel #90 W/refills. [facility]

## 2018-11-06 NOTE — Telephone Encounter (Signed)
Refills sent

## 2018-12-01 ENCOUNTER — Telehealth (HOSPITAL_COMMUNITY): Payer: Self-pay

## 2018-12-01 NOTE — Telephone Encounter (Signed)
LMTCB COVID prescreening for echo. Left echo appt details per DPR.

## 2018-12-01 NOTE — Telephone Encounter (Signed)
COVID-19 Pre-Screening Questions:  . Do you currently have a fever? no (yes = cancel and refer to pcp for e-visit) . Have you recently travelled on a cruise, internationally, or to [place], [place], [place], [place], [place], or [place], [place] [employer]) ? no (yes = cancel, stay home, monitor symptoms, and contact pcp or initiate e-visit if symptoms develop) . Have you been in contact with someone that is currently pending confirmation of Covid19 testing or has been confirmed to have the [place] virus?  No (yes = cancel, stay home, away from tested individual, monitor symptoms, and contact pcp or initiate e-visit if symptoms develop) . Are you currently experiencing fatigue or cough? no (yes = pt should be prepared to have a mask placed at the time of their visit). .  . Reiterated no additional visitors. Eartha Inch no earlier than 15 minutes before appointment time. . Please bring own mask.

## 2018-12-02 ENCOUNTER — Ambulatory Visit (HOSPITAL_COMMUNITY): Payer: Medicare Other | Attending: Cardiovascular Disease

## 2018-12-02 ENCOUNTER — Other Ambulatory Visit: Payer: Self-pay

## 2018-12-02 DIAGNOSIS — I2510 Atherosclerotic heart disease of native coronary artery without angina pectoris: Secondary | ICD-10-CM | POA: Insufficient documentation

## 2018-12-02 DIAGNOSIS — I350 Nonrheumatic aortic (valve) stenosis: Secondary | ICD-10-CM | POA: Insufficient documentation

## 2018-12-02 DIAGNOSIS — I739 Peripheral vascular disease, unspecified: Secondary | ICD-10-CM | POA: Insufficient documentation

## 2018-12-02 DIAGNOSIS — E785 Hyperlipidemia, unspecified: Secondary | ICD-10-CM | POA: Diagnosis not present

## 2018-12-02 DIAGNOSIS — I10 Essential (primary) hypertension: Secondary | ICD-10-CM | POA: Insufficient documentation

## 2018-12-02 DIAGNOSIS — Z8679 Personal history of other diseases of the circulatory system: Secondary | ICD-10-CM | POA: Insufficient documentation

## 2018-12-02 DIAGNOSIS — Z9861 Coronary angioplasty status: Secondary | ICD-10-CM | POA: Diagnosis not present

## 2018-12-17 ENCOUNTER — Ambulatory Visit: Payer: Medicare Other | Admitting: Cardiovascular Disease

## 2018-12-29 ENCOUNTER — Other Ambulatory Visit: Payer: Self-pay | Admitting: Internal Medicine

## 2019-01-04 ENCOUNTER — Other Ambulatory Visit: Payer: Self-pay

## 2019-01-04 DIAGNOSIS — I714 Abdominal aortic aneurysm, without rupture, unspecified: Secondary | ICD-10-CM

## 2019-01-05 ENCOUNTER — Ambulatory Visit: Payer: Medicare Other | Admitting: Family

## 2019-01-05 ENCOUNTER — Ambulatory Visit (HOSPITAL_COMMUNITY)
Admission: RE | Admit: 2019-01-05 | Discharge: 2019-01-05 | Disposition: A | Discharge status: Home / Self Care | Payer: Medicare Other | Source: Ambulatory Visit | Attending: Family | Admitting: Family

## 2019-01-05 ENCOUNTER — Other Ambulatory Visit: Payer: Self-pay

## 2019-01-05 DIAGNOSIS — I714 Abdominal aortic aneurysm, without rupture, unspecified: Secondary | ICD-10-CM

## 2019-01-06 ENCOUNTER — Ambulatory Visit (INDEPENDENT_AMBULATORY_CARE_PROVIDER_SITE_OTHER): Payer: Medicare Other | Admitting: *Deleted

## 2019-01-06 ENCOUNTER — Encounter: Payer: Self-pay | Admitting: Internal Medicine

## 2019-01-06 ENCOUNTER — Other Ambulatory Visit: Payer: Self-pay

## 2019-01-06 ENCOUNTER — Other Ambulatory Visit (INDEPENDENT_AMBULATORY_CARE_PROVIDER_SITE_OTHER): Payer: Medicare Other

## 2019-01-06 ENCOUNTER — Ambulatory Visit (INDEPENDENT_AMBULATORY_CARE_PROVIDER_SITE_OTHER): Payer: Medicare Other | Admitting: Internal Medicine

## 2019-01-06 VITALS — BP 140/90 | HR 58 | Ht 63.0 in | Wt 138.0 lb

## 2019-01-06 VITALS — BP 140/90 | HR 58 | Temp 97.6°F | Ht 63.0 in | Wt 138.0 lb

## 2019-01-06 DIAGNOSIS — H409 Unspecified glaucoma: Secondary | ICD-10-CM

## 2019-01-06 DIAGNOSIS — I2510 Atherosclerotic heart disease of native coronary artery without angina pectoris: Secondary | ICD-10-CM | POA: Diagnosis not present

## 2019-01-06 DIAGNOSIS — E785 Hyperlipidemia, unspecified: Secondary | ICD-10-CM | POA: Diagnosis not present

## 2019-01-06 DIAGNOSIS — I10 Essential (primary) hypertension: Secondary | ICD-10-CM

## 2019-01-06 DIAGNOSIS — Z0000 Encounter for general adult medical examination without abnormal findings: Secondary | ICD-10-CM | POA: Diagnosis not present

## 2019-01-06 DIAGNOSIS — Z780 Asymptomatic menopausal state: Secondary | ICD-10-CM

## 2019-01-06 DIAGNOSIS — N959 Unspecified menopausal and perimenopausal disorder: Secondary | ICD-10-CM

## 2019-01-06 DIAGNOSIS — Z9861 Coronary angioplasty status: Secondary | ICD-10-CM

## 2019-01-06 DIAGNOSIS — N182 Chronic kidney disease, stage 2 (mild): Secondary | ICD-10-CM | POA: Diagnosis not present

## 2019-01-06 DIAGNOSIS — Z1239 Encounter for other screening for malignant neoplasm of breast: Secondary | ICD-10-CM

## 2019-01-06 LAB — URINALYSIS, ROUTINE W REFLEX MICROSCOPIC
Bilirubin Urine: NEGATIVE
Hgb urine dipstick: NEGATIVE
Ketones, ur: NEGATIVE
Nitrite: NEGATIVE
Specific Gravity, Urine: 1.025 (ref 1.000–1.030)
Total Protein, Urine: NEGATIVE
Urine Glucose: NEGATIVE
Urobilinogen, UA: 0.2 (ref 0.0–1.0)
pH: 6 (ref 5.0–8.0)

## 2019-01-06 LAB — CBC WITH DIFFERENTIAL/PLATELET
Basophils Absolute: 0.1 10*3/uL (ref 0.0–0.1)
Basophils Relative: 0.7 % (ref 0.0–3.0)
Eosinophils Absolute: 0.2 10*3/uL (ref 0.0–0.7)
Eosinophils Relative: 2.1 % (ref 0.0–5.0)
HCT: 43.3 % (ref 36.0–46.0)
Hemoglobin: 14 g/dL (ref 12.0–15.0)
Lymphocytes Relative: 24.6 % (ref 12.0–46.0)
Lymphs Abs: 1.8 10*3/uL (ref 0.7–4.0)
MCHC: 32.3 g/dL (ref 30.0–36.0)
MCV: 87.8 fl (ref 78.0–100.0)
Monocytes Absolute: 0.4 10*3/uL (ref 0.1–1.0)
Monocytes Relative: 5.7 % (ref 3.0–12.0)
Neutro Abs: 5 10*3/uL (ref 1.4–7.7)
Neutrophils Relative %: 66.9 % (ref 43.0–77.0)
Platelets: 207 10*3/uL (ref 150.0–400.0)
RBC: 4.93 Mil/uL (ref 3.87–5.11)
RDW: 14.9 % (ref 11.5–15.5)
WBC: 7.5 10*3/uL (ref 4.0–10.5)

## 2019-01-06 LAB — LIPID PANEL
Cholesterol: 130 mg/dL (ref 0–200)
HDL: 35.1 mg/dL — ABNORMAL LOW (ref >39.00)
LDL Cholesterol: 75 mg/dL (ref 0–99)
NonHDL: 95.22
Total CHOL/HDL Ratio: 4
Triglycerides: 100 mg/dL (ref 0.0–149.0)
VLDL: 20 mg/dL (ref 0.0–40.0)

## 2019-01-06 LAB — BASIC METABOLIC PANEL
BUN: 15 mg/dL (ref 6–23)
CO2: 29 mEq/L (ref 19–32)
Calcium: 9.4 mg/dL (ref 8.4–10.5)
Chloride: 106 mEq/L (ref 96–112)
Creatinine, Ser: 0.84 mg/dL (ref 0.40–1.20)
GFR: 64.71 mL/min (ref >60.00)
Glucose, Bld: 99 mg/dL (ref 70–99)
Potassium: 4.4 mEq/L (ref 3.5–5.1)
Sodium: 141 mEq/L (ref 135–145)

## 2019-01-06 LAB — HEPATIC FUNCTION PANEL
ALT: 11 U/L (ref 0–35)
AST: 17 U/L (ref 0–37)
Albumin: 4 g/dL (ref 3.5–5.2)
Alkaline Phosphatase: 53 U/L (ref 39–117)
Bilirubin, Direct: 0 mg/dL (ref 0.0–0.3)
Total Bilirubin: 0.4 mg/dL (ref 0.2–1.2)
Total Protein: 6.6 g/dL (ref 6.0–8.3)

## 2019-01-06 LAB — TSH: TSH: 6.52 u[IU]/mL — ABNORMAL HIGH (ref 0.35–4.50)

## 2019-01-06 NOTE — Patient Instructions (Addendum)
Continue doing brain stimulating activities (puzzles, reading, adult coloring books, staying active) to keep memory sharp.   Continue to eat heart healthy diet (full of fruits, vegetables, whole grains, lean protein, water--limit salt, fat, and sugar intake) and increase physical activity as tolerated.   Tammy Brooks , Thank you for taking time to come for your Medicare Wellness Visit. I appreciate your ongoing commitment to your health goals. Please review the following plan we discussed and let me know if I can assist you in the future.   These are the goals we discussed: Goals    . Enjoy life, shop, travel, spend time with family and grandchildren    . Patient Stated     Stay as healthy and as independent as possible. Maintain my current healthy status.        This is a list of the screening recommended for you and due dates:  Health Maintenance  Topic Date Due  . DEXA scan (bone density measurement)  10/02/2000  . Flu Shot  01/02/2019  . Tetanus Vaccine  12/31/2027  . Pneumonia vaccines  Completed    Preventive Care 34 Years and Older, Female Preventive care refers to lifestyle choices and visits with your health care provider that can promote health and wellness. This includes:  A yearly physical exam. This is also called an annual well check.  Regular dental and eye exams.  Immunizations.  Screening for certain conditions.  Healthy lifestyle choices, such as diet and exercise. What can I expect for my preventive care visit? Physical exam Your health care provider will check:  Height and weight. These may be used to calculate body mass index (BMI), which is a measurement that tells if you are at a healthy weight.  Heart rate and blood pressure.  Your skin for abnormal spots. Counseling Your health care provider may ask you questions about:  Alcohol, tobacco, and drug use.  Emotional well-being.  Home and relationship well-being.  Sexual activity.  Eating  habits.  History of falls.  Memory and ability to understand (cognition).  Work and work Statistician.  Pregnancy and menstrual history. What immunizations do I need?  Influenza (flu) vaccine  This is recommended every year. Tetanus, diphtheria, and pertussis (Tdap) vaccine  You may need a Td booster every 10 years. Varicella (chickenpox) vaccine  You may need this vaccine if you have not already been vaccinated. Zoster (shingles) vaccine  You may need this after age 31. Pneumococcal conjugate (PCV13) vaccine  One dose is recommended after age 37. Pneumococcal polysaccharide (PPSV23) vaccine  One dose is recommended after age 55. Measles, mumps, and rubella (MMR) vaccine  You may need at least one dose of MMR if you were born in 1957 or later. You may also need a second dose. Meningococcal conjugate (MenACWY) vaccine  You may need this if you have certain conditions. Hepatitis A vaccine  You may need this if you have certain conditions or if you travel or work in places where you may be exposed to hepatitis A. Hepatitis B vaccine  You may need this if you have certain conditions or if you travel or work in places where you may be exposed to hepatitis B. Haemophilus influenzae type b (Hib) vaccine  You may need this if you have certain conditions. You may receive vaccines as individual doses or as more than one vaccine together in one shot (combination vaccines). Talk with your health care provider about the risks and benefits of combination vaccines. What tests do  I need? Blood tests  Lipid and cholesterol levels. These may be checked every 5 years, or more frequently depending on your overall health.  Hepatitis C test.  Hepatitis B test. Screening  Lung cancer screening. You may have this screening every year starting at age 43 if you have a 30-pack-year history of smoking and currently smoke or have quit within the past 15 years.  Colorectal cancer screening.  All adults should have this screening starting at age 39 and continuing until age 74. Your health care provider may recommend screening at age 34 if you are at increased risk. You will have tests every 1-10 years, depending on your results and the type of screening test.  Diabetes screening. This is done by checking your blood sugar (glucose) after you have not eaten for a while (fasting). You may have this done every 1-3 years.  Mammogram. This may be done every 1-2 years. Talk with your health care provider about how often you should have regular mammograms.  BRCA-related cancer screening. This may be done if you have a family history of breast, ovarian, tubal, or peritoneal cancers. Other tests  Sexually transmitted disease (STD) testing.  Bone density scan. This is done to screen for osteoporosis. You may have this done starting at age 62. Follow these instructions at home: Eating and drinking  Eat a diet that includes fresh fruits and vegetables, whole grains, lean protein, and low-fat dairy products. Limit your intake of foods with high amounts of sugar, saturated fats, and salt.  Take vitamin and mineral supplements as recommended by your health care provider.  Do not drink alcohol if your health care provider tells you not to drink.  If you drink alcohol: ? Limit how much you have to 0-1 drink a day. ? Be aware of how much alcohol is in your drink. In the [place] one drink equals one 12 oz bottle of beer (355 mL), one 5 oz glass of  (148 mL), or one 1 oz glass of hard liquor (44 mL). Lifestyle  Take daily care of your teeth and gums.  Stay active. Exercise for at least 30 minutes on 5 or more days each week.  Do not use any products that contain nicotine or tobacco, such as cigarettes, e-cigarettes, and chewing tobacco. If you need help quitting, ask your health care provider.  If you are sexually active, practice safe sex. Use a condom or other form of protection in order  to prevent STIs (sexually transmitted infections).  Talk with your health care provider about taking a low-dose aspirin or statin. What's next?  Go to your health care provider once a year for a well check visit.  Ask your health care provider how often you should have your eyes and teeth checked.  Stay up to date on all vaccines. This information is not intended to replace advice given to you by your health care provider. Make sure you discuss any questions you have with your health care provider. Document Released: 06/16/2015 Document Revised: 05/14/2018 Document Reviewed: 05/14/2018 Elsevier Patient Education  2020 [employer].

## 2019-01-06 NOTE — Assessment & Plan Note (Signed)
We discussed age appropriate health related issues, including available/recomended screening tests and vaccinations. We discussed a need for adhering to healthy diet and exercise. Labs were ordered to be later reviewed . All questions were answered.  Mammo, [facility] Labs Appt w/Jill

## 2019-01-06 NOTE — Patient Instructions (Signed)
  Mediterranean diet is good for you. ([employer] has a typical Mediterranean cuisine menu) The Mediterranean diet is a way of eating based on the traditional cuisine of countries bordering the [place] Sea. While there is no single definition of the Mediterranean diet, it is typically high in vegetables, fruits, whole grains, beans, nut and seeds, and olive oil. The main components of Mediterranean diet include: . Daily consumption of vegetables, fruits, whole grains and healthy fats  . Weekly intake of fish, poultry, beans and eggs  . Moderate portions of dairy products  . Limited intake of red meat Other important elements of the Mediterranean diet are sharing meals with family and friends, enjoying a glass of red wine and being physically active. Health benefits of a Mediterranean diet: A traditional Mediterranean diet consisting of large quantities of fresh fruits and vegetables, nuts, fish and olive oil-coupled with physical activity-can reduce your risk of serious mental and physical health problems by: Preventing heart disease and strokes. Following a Mediterranean diet limits your intake of refined breads, processed foods, and red meat, and encourages drinking red wine instead of hard liquor-all factors that can help prevent heart disease and stroke. Keeping you agile. If you're an older adult, the nutrients gained with a Mediterranean diet may reduce your risk of developing muscle weakness and other signs of frailty by about 70 percent. Reducing the risk of Alzheimer's. Research suggests that the Mediterranean diet may improve cholesterol, blood sugar levels, and overall blood vessel health, which in turn may reduce your risk of Alzheimer's disease or dementia. Halving the risk of Parkinson's disease. The high levels of antioxidants in the Mediterranean diet can prevent cells from undergoing a damaging process called oxidative stress, thereby cutting the risk of Parkinson's disease in  half. Increasing longevity. By reducing your risk of developing heart disease or cancer with the Mediterranean diet, you're reducing your risk of death at any age by 20%. Protecting against type 2 diabetes. A Mediterranean diet is rich in fiber which digests slowly, prevents huge swings in blood sugar, and can help you maintain a healthy weight.   

## 2019-01-06 NOTE — Assessment & Plan Note (Signed)
Labs

## 2019-01-06 NOTE — Progress Notes (Signed)
Subjective:  Patient ID: Tammy Brooks, female    DOB: 01-Nov-1933  Age: 85 y.o. MRN: [id number]  CC: Annual Exam   HPI LEXII WALSH presents for a well exam F/u gout, HTN, CAD Outpatient Medications Prior to Visit  Medication Sig Dispense Refill  . allopurinol (ZYLOPRIM) 100 MG tablet Take 1 tablet (100 mg total) by mouth daily. 90 tablet 3  . amLODipine (NORVASC) 5 MG tablet Take 1 tablet (5 mg total) by mouth daily. 90 tablet 1  . atorvastatin (LIPITOR) 40 MG tablet Take 2 tablets (80 mg total) by mouth daily. (Patient taking differently: Take 80 mg by mouth at bedtime. ) 180 tablet 3  . cholecalciferol (VITAMIN D) 1000 units tablet Take 1,000 Units by mouth daily.    . clopidogrel (PLAVIX) 75 MG tablet Take 1 tablet (75 mg total) by mouth daily. 90 tablet 3  . colchicine 0.6 MG tablet Take 1 tablet (0.6 mg total) by mouth 4 (four) times daily as needed. (Patient taking differently: Take 0.6 mg by mouth 4 (four) times daily as needed (for gout flares). ) 360 tablet 2  . furosemide (LASIX) 20 MG tablet TAKE 1-2 TABLETS BY MOUTH  DAILY AS NEEDED FOR EDEMA. (Patient taking differently: Take 20-40 mg by mouth daily as needed for edema. ) 180 tablet 1  . isosorbide mononitrate (IMDUR) 30 MG 24 hr tablet TAKE 1 TABLET BY MOUTH  DAILY 90 tablet 2  . levothyroxine (SYNTHROID) 50 MCG tablet TAKE 1 TABLET BY MOUTH  DAILY 90 tablet 3  . losartan (COZAAR) 25 MG tablet Take 1 tablet (25 mg total) by mouth daily. 90 tablet 1  . metoprolol tartrate (LOPRESSOR) 25 MG tablet Take 0.5 tablets (12.5 mg total) by mouth 2 (two) times daily. 90 tablet 3  . Multiple Vitamins-Minerals (PRESERVISION/LUTEIN) CAPS Take 1 capsule by mouth daily with breakfast.    . naproxen sodium (ALEVE) 220 MG tablet Take 440 mg by mouth 2 (two) times daily as needed (for back pain).     . nitroGLYCERIN (NITROSTAT) 0.4 MG SL tablet Place 0.4 mg under the tongue every 5 (five) minutes as needed for chest pain.    Marland Kitchen tiZANidine  (ZANAFLEX) 4 MG tablet Take 1 tablet (4 mg total) by mouth every 8 (eight) hours as needed for muscle spasms. 90 tablet 2  . traMADol (ULTRAM) 50 MG tablet Take 1 tablet (50 mg total) by mouth every 8 (eight) hours as needed. (Patient taking differently: Take 50 mg by mouth every 8 (eight) hours as needed (FOR PAIN). ) 100 tablet 2   No facility-administered medications prior to visit.     ROS: Review of Systems  Constitutional: Negative for activity change, appetite change, chills, fatigue and unexpected weight change.  HENT: Negative for congestion, mouth sores and sinus pressure.   Eyes: Negative for visual disturbance.  Respiratory: Negative for cough and chest tightness.   Cardiovascular: Negative for chest pain, palpitations and leg swelling.  Gastrointestinal: Negative for abdominal pain and nausea.  Genitourinary: Negative for difficulty urinating, frequency and vaginal pain.  Musculoskeletal: Positive for arthralgias. Negative for back pain and gait problem.  Skin: Negative for pallor and rash.  Neurological: Negative for dizziness, tremors, weakness, numbness and headaches.  Psychiatric/Behavioral: Negative for confusion, sleep disturbance and suicidal ideas. The patient is not nervous/anxious.     Objective:  BP 140/90 (BP Location: Left Arm, Patient Position: Sitting, Cuff Size: Normal)   Pulse (!) 58   Temp 97.6 F (36.4 C) (  Oral)   Ht 5\' 3"  (1.6 m)   Wt 138 lb (62.6 kg)   SpO2 98%   BMI 24.45 kg/m   BP Readings from Last 3 Encounters:  01/06/19 140/90  11/05/18 (!) 148/86  09/09/18 139/73    Wt Readings from Last 3 Encounters:  01/06/19 138 lb (62.6 kg)  11/05/18 139 lb (63 kg)  08/31/18 142 lb (64.4 kg)    Physical Exam Constitutional:      General: She is not in acute distress.    Appearance: She is well-developed.  HENT:     Head: Normocephalic.     Right Ear: External ear normal.     Left Ear: External ear normal.     Nose: Nose normal.  Eyes:      General:        Right eye: No discharge.        Left eye: No discharge.     Conjunctiva/sclera: Conjunctivae normal.     Pupils: Pupils are equal, round, and reactive to light.  Neck:     Musculoskeletal: Normal range of motion and neck supple.     Thyroid: No thyromegaly.     Vascular: No JVD.     Trachea: No tracheal deviation.  Cardiovascular:     Rate and Rhythm: Normal rate and regular rhythm.     Heart sounds: Normal heart sounds. No gallop.   Pulmonary:     Effort: No respiratory distress.     Breath sounds: No stridor. No wheezing or rhonchi.  Chest:     Chest wall: No tenderness.  Abdominal:     General: Bowel sounds are normal. There is no distension.     Palpations: Abdomen is soft. There is no mass.     Tenderness: There is no abdominal tenderness. There is no guarding or rebound.  Musculoskeletal:        General: No tenderness.  Lymphadenopathy:     Cervical: No cervical adenopathy.  Skin:    Findings: No erythema or rash.  Neurological:     Cranial Nerves: No cranial nerve deficit.     Motor: No abnormal muscle tone.     Coordination: Coordination normal.     Deep Tendon Reflexes: Reflexes normal.  Psychiatric:        Behavior: Behavior normal.        Thought Content: Thought content normal.        Judgment: Judgment normal.   decr BS B Skin w/AK  Lab Results  Component Value Date   WBC 7.1 08/31/2018   HGB 13.7 08/31/2018   HCT 44.5 08/31/2018   PLT 226 08/31/2018   GLUCOSE 88 08/31/2018   CHOL 121 04/02/2018   TRIG 95.0 04/02/2018   HDL 31.50 (L) 04/02/2018   LDLDIRECT 76.7 11/17/2008   LDLCALC 70 04/02/2018   ALT 12 08/31/2018   AST 19 08/31/2018   NA 139 08/31/2018   K 4.1 08/31/2018   CL 106 08/31/2018   CREATININE 0.96 08/31/2018   BUN 11 08/31/2018   CO2 27 08/31/2018   TSH 5.54 (H) 08/21/2017    Vas [place] Aaa Duplex  Result Date: 01/05/2019 ABDOMINAL AORTA STUDY Indications: Follow up exam for known AAA.  Comparison Study:  Increased size of aortic aneurysm Performing Technologist: Alvia Grove RVT  Examination Guidelines: A complete evaluation includes B-mode imaging, spectral Doppler, color Doppler, and power Doppler as needed of all accessible portions of each vessel. Bilateral testing is considered an integral part of a complete examination. Limited  examinations for reoccurring indications may be performed as noted.  Abdominal Aorta Findings: +-----------+-------+----------+----------+----------+--------+--------+ Location   AP (cm)Trans (cm)PSV (cm/s)Waveform  ThrombusComments +-----------+-------+----------+----------+----------+--------+--------+ Proximal   2.43   2.40      56        monophasic                 +-----------+-------+----------+----------+----------+--------+--------+ Mid to dst 5.12   5.00      33        biphasic  Present          +-----------+-------+----------+----------+----------+--------+--------+ Distal     4.40   4.69      17        biphasic                   +-----------+-------+----------+----------+----------+--------+--------+ RT CIA Prox1.2    1.0       111       triphasic                  +-----------+-------+----------+----------+----------+--------+--------+ LT CIA Prox1.4    1.4       68        triphasic                  +-----------+-------+----------+----------+----------+--------+--------+  Summary: Abdominal Aorta: The largest aortic diameter has increased (5.12 x 5.00 cm ) compared to prior exam. Previous diameter measurement was 4.5 x 4.94 cm obtained on 07/03/2017. The bilateral proximal iliac arteries not well visualized due to bowel gas.  *See table(s) above for measurements and observations.  Electronically signed by Curt Jews MD on 01/05/2019 at 10:23:19 AM.   Final     Assessment & Plan:   There are no diagnoses linked to this encounter.   No orders of the defined types were placed in this encounter.    Follow-up: No follow-ups on  file.  Walker Kehr, MD

## 2019-01-06 NOTE — Assessment & Plan Note (Signed)
Amlodipine, Losartan, Toprol

## 2019-01-06 NOTE — Assessment & Plan Note (Signed)
[facility] 

## 2019-01-06 NOTE — Assessment & Plan Note (Addendum)
F/u w/Dr Cordelia Pen

## 2019-01-06 NOTE — Progress Notes (Addendum)
Subjective:   Tammy Brooks is a 85 y.o. female who presents for Medicare Annual (Subsequent) preventive examination.  Review of Systems:  Cardiac Risk Factors include: advanced age (>45men, >40 women);hypertension Sleep patterns: feels rested on waking, gets up 2-3 times nightly to void and sleeps 6-7 hours nightly.    Home Safety/Smoke Alarms: Feels safe in home. Smoke alarms in place.  Living environment; residence and Firearm Safety: 1-story house/ trailer, number of outside stairs: 8. With railings. Lives with son, no needs for DME, good support system Seat Belt Safety/Bike Helmet: Wears seat belt.      Objective:     Vitals: There were no vitals taken for this visit.  There is no height or weight on file to calculate BMI.  Advanced Directives 01/06/2019 08/31/2018 02/10/2018 01/06/2018 07/15/2017 02/04/2017 12/24/2016  Does Patient Have a Medical Advance Directive? Yes Yes Yes Yes Yes Yes Yes  Type of Paramedic of [place];Living will Living will;Healthcare Power of [place];Living will [place];Living will [place];Living will [place];Living will [place];Living will  Does patient want to make changes to medical advance directive? - - - - No - Patient declined - -  Copy of [place] in Chart? No - copy requested - Yes - Yes No - copy requested -  Would patient like information on creating a medical advance directive? - No - Patient declined - - - - -    Tobacco Social History   Tobacco Use  Smoking Status Current Every Day Smoker  . Packs/day: 0.50  . Years: 62.00  . Pack years: 31.00  . Types: Cigarettes  Smokeless Tobacco Never Used     Ready to quit: No Counseling given: No  Past Medical History:  Diagnosis Date  . Abdominal aortic aneurysm ([place])    followed by Dr. Sherren Mocha Early  . Adenomatous colon polyp 1998  .  Arthritis   . Blind left eye   . CAD (coronary artery disease) 08/1995   50-55% second diag dz.; occluded OM  . Complication of anesthesia    "Blood pressure went down" with retinal surgery - no problems with previous surgeries  . Echocardiogram abnormal 03/2012   mild to mod AS, valve area1.58 cm2, mild concentric LVH, grade I diastolic dusfunction  . Gout   . Heart murmur   . Hyperlipidemia   . Hypertension   . MI (myocardial infarction) ([place]) 01/25/2018  . Myocardial infarction [facility])    mild/ age 86  . PAD (peripheral artery disease) ([facility])    mild to mod Rt ICA stenosis followed by doppler-02/05/12  . Pelvic mass   . Stroke [facility]) 2001   no residual problems  . Tobacco abuse   . Ulcer    Past Surgical History:  Procedure Laterality Date  . ABDOMINAL HYSTERECTOMY    . APPENDECTOMY    . CARDIAC CATHETERIZATION  08/1995   50-55% second diag lesion, occluded OM  . CATARACT EXTRACTION W/ INTRAOCULAR LENS  IMPLANT, BILATERAL    . COLONOSCOPY    . EYE SURGERY     lense implant removed/ blind in left eye  . LAPAROTOMY N/A 04/26/2014   Procedure: EXPLORATORY LAPAROTOMY/RESECTION OF PELVIC MASS;  Surgeon: Alvino Chapel, MD;  Location: [facility] ORS;  Service: Gynecology;  Laterality: N/A;  . OOPHORECTOMY     and fallopian tube  . POLYPECTOMY    . RETINAL DETACHMENT SURGERY  2006  .  TONSILLECTOMY AND ADENOIDECTOMY    . TUBAL LIGATION    . UPPER GASTROINTESTINAL ENDOSCOPY     treated for h-pylori   Family History  Problem Relation Age of Onset  . Aneurysm Mother        AAA  . Hypertension Mother   . Hyperlipidemia Mother   . Heart disease Father        CAD, MI x 3  . Heart attack Father   . Cancer Maternal Aunt        breast cancer  . Diabetes Sister   . Hyperlipidemia Sister   . Hypertension Sister   . Colon cancer Neg Hx   . Stomach cancer Neg Hx    Social History   Socioeconomic History  . Marital status: Widowed    Spouse name: Not on file  . Number of  children: 5  . Years of education: 9  . Highest education level: 9th grade  Occupational History  . Occupation: retired    Comment: retired  Scientific laboratory technician  . Financial resource strain: Not hard at all  . Food insecurity    Worry: Never true    Inability: Never true  . Transportation needs    Medical: No    Non-medical: No  Tobacco Use  . Smoking status: Current Every Day Smoker    Packs/day: 0.50    Years: 62.00    Pack years: 31.00    Types: Cigarettes  . Smokeless tobacco: Never Used  Substance and Sexual Activity  . Alcohol use: Yes    Alcohol/week: 0.0 standard drinks    Comment: occasionally - few drinks/month  . Drug use: No  . Sexual activity: Never    Partners: Male    Birth control/protection: None, Surgical  Lifestyle  . Physical activity    Days per week: 0 days    Minutes per session: 0 min  . Stress: Not at all  Relationships  . Social connections    Talks on phone: More than three times a week    Gets together: More than three times a week    Attends religious service: Not on file    Active member of club or organization: Not on file    Attends meetings of clubs or organizations: Not on file    Relationship status: Widowed  Other Topics Concern  . Not on file  Social History Narrative   9th grade. married '54- widowed Dec 10th'08. 4 sons - '55, '58, '60, '69; 1 daughter - '63; 12 grandchildren; 9 great-grandchildren. 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 Other children are close and attentive. ACP/End-of-life: No CPR, no mechanical ventilation, no futile/heroic measures.    Outpatient Encounter Medications as of 01/06/2019  Medication Sig  . allopurinol (ZYLOPRIM) 100 MG tablet Take 1 tablet (100 mg total) by mouth daily.  Marland Kitchen amLODipine (NORVASC) 5 MG tablet Take 1 tablet (5 mg total) by mouth daily.  Marland Kitchen atorvastatin (LIPITOR) 40 MG tablet Take 2 tablets (80 mg total) by mouth daily. (Patient taking  differently: Take 80 mg by mouth at bedtime. )  . cholecalciferol (VITAMIN D) 1000 units tablet Take 1,000 Units by mouth daily.  . clopidogrel (PLAVIX) 75 MG tablet Take 1 tablet (75 mg total) by mouth daily.  . colchicine 0.6 MG tablet Take 1 tablet (0.6 mg total) by mouth 4 (four) times daily as needed. (Patient taking differently: Take 0.6 mg by mouth 4 (four) times daily as needed (  for gout flares). )  . furosemide (LASIX) 20 MG tablet TAKE 1-2 TABLETS BY MOUTH  DAILY AS NEEDED FOR EDEMA. (Patient taking differently: Take 20-40 mg by mouth daily as needed for edema. )  . isosorbide mononitrate (IMDUR) 30 MG 24 hr tablet TAKE 1 TABLET BY MOUTH  DAILY  . levothyroxine (SYNTHROID) 50 MCG tablet TAKE 1 TABLET BY MOUTH  DAILY  . losartan (COZAAR) 25 MG tablet Take 1 tablet (25 mg total) by mouth daily.  . metoprolol tartrate (LOPRESSOR) 25 MG tablet Take 0.5 tablets (12.5 mg total) by mouth 2 (two) times daily.  . Multiple Vitamins-Minerals (PRESERVISION/LUTEIN) CAPS Take 1 capsule by mouth daily with breakfast.  . naproxen sodium (ALEVE) 220 MG tablet Take 440 mg by mouth 2 (two) times daily as needed (for back pain).   . nitroGLYCERIN (NITROSTAT) 0.4 MG SL tablet Place 0.4 mg under the tongue every 5 (five) minutes as needed for chest pain.  Marland Kitchen tiZANidine (ZANAFLEX) 4 MG tablet Take 1 tablet (4 mg total) by mouth every 8 (eight) hours as needed for muscle spasms.  . traMADol (ULTRAM) 50 MG tablet Take 1 tablet (50 mg total) by mouth every 8 (eight) hours as needed. (Patient taking differently: Take 50 mg by mouth every 8 (eight) hours as needed (FOR PAIN). )   No facility-administered encounter medications on file as of 01/06/2019.     Activities of Daily Living In your present state of health, do you have any difficulty performing the following activities: 01/06/2019  Hearing? N  Vision? N  Difficulty concentrating or making decisions? N  Walking or climbing stairs? N  Dressing or bathing? N   Doing errands, shopping? N  Preparing Food and eating ? N  Using the Toilet? N  In the past six months, have you accidently leaked urine? N  Do you have problems with loss of bowel control? N  Managing your Medications? N  Managing your Finances? N  Housekeeping or managing your Housekeeping? N  Some recent data might be hidden    Patient Care Team: Plotnikov, Evie Lacks, MD as PCP - General (Internal Medicine) Lorretta Harp, MD as PCP - Cardiology (Cardiology) Bond, Tracie Harrier, MD as Referring Physician (Ophthalmology) Gerarda Fraction, MD as Referring Physician (Ophthalmology) Marti Sleigh, MD as Attending Physician (Gynecology) Early, Arvilla Meres, MD as Consulting Physician (Vascular Surgery) Ralene Ok, MD as Consulting Physician (General Surgery)    Assessment:   This is a routine wellness examination for Mekayla. Physical assessment deferred to PCP.  Exercise Activities and Dietary recommendations Current Exercise Habits: The patient does not participate in regular exercise at present, Exercise limited by: None identified  Diet (meal preparation, eat out, water intake, caffeinated beverages, dairy products, fruits and vegetables): in general, a "healthy" diet  , well balanced   Reviewed heart healthy diet. Encouraged patient to increase daily water and healthy fluid intake.  Goals    . Enjoy life, shop, travel, spend time with family and grandchildren    . Patient Stated     Stay as healthy and as independent as possible. Maintain my current healthy status.        Fall Risk Fall Risk  01/06/2019 01/13/2018 02/04/2017 01/30/2016 08/30/2014  Falls in the past year? 0 No No No No  Number falls in past yr: 0 - - - -  Injury with Fall? 0 - - - -   Depression Screen PHQ 2/9 Scores 01/06/2019 02/04/2017 01/30/2016 08/30/2014  PHQ - 2 Score 0  0 0 0  PHQ- 9 Score - 0 - -     Cognitive Function MMSE - Mini Mental State Exam 02/04/2017  Orientation to time 5  Orientation  to Place 5  Registration 3  Attention/ Calculation 4  Recall 1  Language- name 2 objects 2  Language- repeat 1  Language- follow 3 step command 3  Language- read & follow direction 1  Write a sentence 1  Copy design 1  Total score 27     6CIT Screen 01/06/2019  What Year? 0 points  What month? 0 points  What time? 0 points  Count back from 20 0 points  Months in reverse 2 points  Repeat phrase 0 points  Total Score 2    Immunization History  Administered Date(s) Administered  . Influenza Split 03/18/2012  . Influenza Whole 03/04/2009  . Influenza, High Dose Seasonal PF 02/04/2017, 03/11/2018  . Influenza,inj,Quad PF,6+ Mos 04/13/2014, 01/30/2016  . Influenza-Unspecified 03/03/2013, 05/09/2015  . Pneumococcal Conjugate-13 08/30/2014  . Pneumococcal Polysaccharide-23 11/17/2008, 08/21/2017  . Td 11/17/2008  . Tdap 12/30/2017  . Zoster 11/21/2009   Screening Tests Health Maintenance  Topic Date Due  . DEXA SCAN  10/02/2000  . INFLUENZA VACCINE  01/02/2019  . TETANUS/TDAP  12/31/2027  . PNA vac Low Risk Adult  Completed      Plan:    Reviewed health maintenance screenings with patient today and relevant education, vaccines, and/or referrals were provided.   I have personally reviewed and noted the following in the patient's chart:   . Medical and social history . Use of alcohol, tobacco or illicit drugs  . Current medications and supplements . Functional ability and status . Nutritional status . Physical activity . Advanced directives . List of other physicians . Vitals . Screenings to include cognitive, depression, and falls . Referrals and appointments  In addition, I have reviewed and discussed with patient certain preventive protocols, quality metrics, and best practice recommendations. A written personalized care plan for preventive services as well as general preventive health recommendations were provided to patient.     Michiel Cowboy, RN  01/06/2019    Medical screening examination/treatment/procedure(s) were performed by non-physician practitioner and as supervising physician I was immediately available for consultation/collaboration. I agree with above. [name], MD

## 2019-01-07 ENCOUNTER — Other Ambulatory Visit (INDEPENDENT_AMBULATORY_CARE_PROVIDER_SITE_OTHER): Payer: Medicare Other

## 2019-01-07 DIAGNOSIS — E039 Hypothyroidism, unspecified: Secondary | ICD-10-CM | POA: Diagnosis not present

## 2019-01-07 LAB — T4, FREE: Free T4: 1.04 ng/dL (ref 0.60–1.60)

## 2019-01-12 ENCOUNTER — Telehealth: Payer: Self-pay | Admitting: *Deleted

## 2019-01-12 NOTE — Telephone Encounter (Signed)
Virtual Visit Pre-Appointment Phone Call  Today, I spoke with Tammy Brooks and performed the following actions:  1. I explained that we are currently trying to limit exposure to the COVID-19 virus by seeing patients at home rather than in the office.  I explained that the visits are best done by video, but can be done by telephone.  I asked the patient if a virtual visit that the patient would like to try instead of coming into the office. Tammy Brooks agreed to proceed with the virtual visit scheduled with Tammy Brooks on 01/13/19.    2. I confirmed the BEST phone number to call the day of the visit and- I included this in appointment notes.  3. I asked if the patient had access to (through a family member/friend) a smartphone with video capability to be used for her visit?"  The patient said yes -  4.  5. I confirmed consent by  a. sending through [place] or by email the [place] as written at the end of this message or  b. verbally as listed below. i. This visit is being performed in the setting of COVID-19. ii. All virtual visits are billed to your insurance company just like a normal visit would be.   iii. We'd like you to understand that the technology does not allow for your provider to perform an examination, and thus may limit your provider's ability to fully assess your condition.  iv. If your provider identifies any concerns that need to be evaluated in person, we will make arrangements to do so.   v. Finally, though the technology is pretty good, we cannot assure that it will always work on either your or our end, and in the setting of a video visit, we may have to convert it to a phone-only visit.  In either situation, we cannot ensure that we have a secure connection.   vi. Are you willing to proceed?"  STAFF: Did the patient verbally acknowledge consent to telehealth visit? Document YES/NO here: YES  2. I advised the patient to be prepared  - I asked that the patient, on the day of her visit, record any information possible with the equipment at her home, such as blood pressure, pulse, oxygen saturation, and your weight and write them all down. I asked the patient to have a pen and paper handy nearby the day of the visit as well.  3. If the patient was scheduled for a video visit, I informed the patient that the visit with the doctor would start with a text to the smartphone # given to [place] by the patient.         If the patient was scheduled for a telephone call, I informed the patient that the visit with the doctor would start with a call to the telephone # given to [place] by the patient.  4. I Informed patient they will receive a phone call 15 minutes prior to their appointment time from a [place] or nurse to review medications, allergies, etc. to prepare for the visit.    TELEPHONE CALL NOTE  Tammy Brooks has been deemed a candidate for a follow-up tele-health visit to limit community exposure during the Covid-19 pandemic. I spoke with the patient via phone to ensure availability of phone/video source, confirm preferred email & phone number, and discuss instructions and expectations.  I reminded Tammy Brooks to be prepared with any vital sign and/or heart  rhythm information that could potentially be obtained via home monitoring, at the time of her visit. I reminded Tammy Brooks to expect a phone call prior to her visit.  [name], NT 01/12/2019 4:19 PM     FULL LENGTH CONSENT FOR TELE-HEALTH VISIT   I hereby voluntarily request, consent and authorize [facility] and its employed or contracted physicians, physician assistants, nurse practitioners or other licensed health care professionals (the Practitioner), to provide me with telemedicine health care services (the "Services") as deemed necessary by the treating Practitioner. I acknowledge and consent to receive the Services by the Practitioner via telemedicine. I understand  that the telemedicine visit will involve communicating with the Practitioner through live audiovisual communication technology and the disclosure of certain medical information by electronic transmission. I acknowledge that I have been given the opportunity to request an in-person assessment or other available alternative prior to the telemedicine visit and am voluntarily participating in the telemedicine visit.  I understand that I have the right to withhold or withdraw my consent to the use of telemedicine in the course of my care at any time, without affecting my right to future care or treatment, and that the Practitioner or I may terminate the telemedicine visit at any time. I understand that I have the right to inspect all information obtained and/or recorded in the course of the telemedicine visit and may receive copies of available information for a reasonable fee.  I understand that some of the potential risks of receiving the Services via telemedicine include:  Marland Kitchen Delay or interruption in medical evaluation due to technological equipment failure or disruption; . Information transmitted may not be sufficient (e.g. poor resolution of images) to allow for appropriate medical decision making by the Practitioner; and/or  . In rare instances, security protocols could fail, causing a breach of personal health information.  Furthermore, I acknowledge that it is my responsibility to provide information about my medical history, conditions and care that is complete and accurate to the best of my ability. I acknowledge that Practitioner's advice, recommendations, and/or decision may be based on factors not within their control, such as incomplete or inaccurate data provided by me or distortions of diagnostic images or specimens that may result from electronic transmissions. I understand that the practice of medicine is not an exact science and that Practitioner makes no warranties or guarantees regarding  treatment outcomes. I acknowledge that I will receive a copy of this consent concurrently upon execution via email to the email address I last provided but may also request a printed copy by calling the office of [place].    I understand that my insurance will be billed for this visit.   I have read or had this consent read to me. . I understand the contents of this consent, which adequately explains the benefits and risks of the Services being provided via telemedicine.  . I have been provided ample opportunity to ask questions regarding this consent and the Services and have had my questions answered to my satisfaction. . I give my informed consent for the services to be provided through the use of telemedicine in my medical care  By participating in this telemedicine visit I agree to the above.

## 2019-01-12 NOTE — Telephone Encounter (Signed)
Copied from [place] [id number]. Topic: Referral - Request for Referral >> Jan 08, 2019  3:45 PM Nils Flack wrote: Has patient seen PCP for this complaint? Yes.   *If NO, is insurance requiring patient see PCP for this issue before PCP can refer them? Referral for which specialty: mammogram  Preferred provider/office:  Reason for referral: needs mammogram orders faxed to [facility] so she can get mammogram done there Pt did not have phone or fax

## 2019-01-12 NOTE — Telephone Encounter (Signed)
This was ordered on 01/06/19.

## 2019-01-13 ENCOUNTER — Other Ambulatory Visit: Payer: Self-pay

## 2019-01-13 ENCOUNTER — Encounter: Payer: Self-pay | Admitting: Family

## 2019-01-13 ENCOUNTER — Ambulatory Visit (INDEPENDENT_AMBULATORY_CARE_PROVIDER_SITE_OTHER): Payer: Medicare Other | Admitting: Family

## 2019-01-13 DIAGNOSIS — F17200 Nicotine dependence, unspecified, uncomplicated: Secondary | ICD-10-CM

## 2019-01-13 DIAGNOSIS — I714 Abdominal aortic aneurysm, without rupture, unspecified: Secondary | ICD-10-CM

## 2019-01-13 DIAGNOSIS — Z8249 Family history of ischemic heart disease and other diseases of the circulatory system: Secondary | ICD-10-CM | POA: Diagnosis not present

## 2019-01-13 NOTE — Progress Notes (Signed)
Virtual Visit via Telephone Note   I connected with Tammy Brooks on 01/13/2019 using the [facility] by telephone and verified that I was speaking with the correct person using two identifiers. Patient was located at her home and accompanied by herself. I am located at the [facility] office[facility]clinic.   The limitations of evaluation and management by telemedicine and the availability of in person appointments have been previously discussed with the patient and are documented in the patients chart. The patient expressed understanding and consented to proceed.  PCP: Plotnikov, Evie Lacks, MD  Chief Complaint: follow up AAA   History of Present Illness: Tammy Brooks is a 85 y.o. female who returns for follow up of her AAA.   She reports that she had a myocardial infarction in August 2019.  Had stenting of her coronary arteries at [facility]. She has no symptoms referable to her aneurysm.   Dr. Donnetta Hutching last evaluated pt on 07-07-18. At that time ultrasound showed maximal diameter of 5.0 cm which is unchanged from her CT scan 6 months prior Stable infrarenal abdominal aortic aneurysm.  Her CT showed that this went up to her renal arteries making her not a candidate for stent graft. Dr. Donnetta Hutching indicated that her risk for rupture is less than her risk for open abdominal surgery at her age and coronary status.  Recommend serial 76-month follow-up with ultrasound to rule out enlargement.  Dr. Donnetta Hutching again reviewed symptoms of leaking aneurysm and she knows to call 911 to present immediately to the hospital should this occur.  She has a known lower abdominal hernia since the above surgery and this is being followed by one of her providers, this is mildly painful. Pt states she has inflammation in her low back and both hips,has pain in these areas, denies radiculopathy type pain. Pt states her mother and maternal aunt had a AAA.  She denies chronic cough or chronic constipation.  She denies chest pain.    The patient denies claudicationsymptomsin legs with walking. She reports a history of stroke in 2000 as manifested by syncope, from this event denies any residual neurological deficit. Pt states Dr. Gwenlyn Found regularly checks her carotid arteries by ultrasound; December 2016 carotid duplex showed <40% bilateral ICA stenosis (review of records). She is blind in her left eye which she attributes as related to cataract surgery.  Pt states her gynecologist, Dr. Adan Sis who performed the BSO and tumor removal asked pt to ask our practice for a referal to a surgeon for repair of an abdominal hernia below the mid abdominal incision. Pt was referred to [place] [facility] for repair of LLQ abdominal hernia; pt states the surgeon told her that when she quitssmoking he would repair this hernia.   Diabetic: No Tobacco use: smoker (1/4 ppd, started at age 66 yrs). Pt states she is trying to quit   Past Medical History:  Diagnosis Date  . Abdominal aortic aneurysm ([place])    followed by Dr. Sherren Mocha [facility]  . Adenomatous colon polyp 1998  . Arthritis   . Blind left eye   . CAD (coronary artery disease) 08/1995   50-55% second diag dz.; occluded OM  . Complication of anesthesia    "Blood pressure went down" with retinal surgery - no problems with previous surgeries  . Echocardiogram abnormal 03/2012   mild to mod AS, valve area1.58 cm2, mild concentric LVH, grade I diastolic dusfunction  . Gout   . Heart murmur   . Hyperlipidemia   .  Hypertension   . MI (myocardial infarction) ([place]) 01/25/2018  . Myocardial infarction [facility])    mild/ age 78  . PAD (peripheral artery disease) ([facility])    mild to mod Rt ICA stenosis followed by doppler-02/05/12  . Pelvic mass   . Stroke [facility]) 2001   no residual problems  . Tobacco abuse   . Ulcer     Past Surgical History:  Procedure Laterality Date  . ABDOMINAL HYSTERECTOMY    . APPENDECTOMY    . CARDIAC CATHETERIZATION  08/1995   50-55% second diag lesion,  occluded OM  . CATARACT EXTRACTION W/ INTRAOCULAR LENS  IMPLANT, BILATERAL    . COLONOSCOPY    . EYE SURGERY     lense implant removed/ blind in left eye  . LAPAROTOMY N/A 04/26/2014   Procedure: EXPLORATORY LAPAROTOMY/RESECTION OF PELVIC MASS;  Surgeon: Alvino Chapel, MD;  Location: [facility];  Service: Gynecology;  Laterality: N/A;  . OOPHORECTOMY     and fallopian tube  . POLYPECTOMY    . RETINAL DETACHMENT SURGERY  2006  . TONSILLECTOMY AND ADENOIDECTOMY    . TUBAL LIGATION    . UPPER GASTROINTESTINAL ENDOSCOPY     treated for h-pylori    Family History  Problem Relation Age of Onset  . Aneurysm Mother        AAA  . Hypertension Mother   . Hyperlipidemia Mother   . Heart disease Father        CAD, MI x 3  . Heart attack Father   . Cancer Maternal Aunt        breast cancer  . Diabetes Sister   . Hyperlipidemia Sister   . Hypertension Sister   . Colon cancer Neg Hx   . Stomach cancer Neg Hx      Current Meds  Medication Sig  . allopurinol (ZYLOPRIM) 100 MG tablet Take 1 tablet (100 mg total) by mouth daily.  Marland Kitchen amLODipine (NORVASC) 5 MG tablet Take 1 tablet (5 mg total) by mouth daily.  Marland Kitchen atorvastatin (LIPITOR) 40 MG tablet Take 2 tablets (80 mg total) by mouth daily. (Patient taking differently: Take 80 mg by mouth at bedtime. )  . cholecalciferol (VITAMIN D) 1000 units tablet Take 1,000 Units by mouth daily.  . clopidogrel (PLAVIX) 75 MG tablet Take 1 tablet (75 mg total) by mouth daily.  . colchicine 0.6 MG tablet Take 1 tablet (0.6 mg total) by mouth 4 (four) times daily as needed. (Patient taking differently: Take 0.6 mg by mouth 4 (four) times daily as needed (for gout flares). )  . furosemide (LASIX) 20 MG tablet TAKE 1-2 TABLETS BY MOUTH  DAILY AS NEEDED FOR EDEMA. (Patient taking differently: Take 20-40 mg by mouth daily as needed for edema. )  . isosorbide mononitrate (IMDUR) 30 MG 24 hr tablet TAKE 1 TABLET BY MOUTH  DAILY  . levothyroxine (SYNTHROID)  50 MCG tablet TAKE 1 TABLET BY MOUTH  DAILY  . losartan (COZAAR) 25 MG tablet Take 1 tablet (25 mg total) by mouth daily.  . metoprolol tartrate (LOPRESSOR) 25 MG tablet Take 0.5 tablets (12.5 mg total) by mouth 2 (two) times daily.  . Multiple Vitamins-Minerals (PRESERVISION/LUTEIN) CAPS Take 1 capsule by mouth daily with breakfast.  . naproxen sodium (ALEVE) 220 MG tablet Take 440 mg by mouth 2 (two) times daily as needed (for back pain).   . nitroGLYCERIN (NITROSTAT) 0.4 MG SL tablet Place 0.4 mg under the tongue every 5 (five) minutes as needed for chest  pain.  . tiZANidine (ZANAFLEX) 4 MG tablet Take 1 tablet (4 mg total) by mouth every 8 (eight) hours as needed for muscle spasms.  . traMADol (ULTRAM) 50 MG tablet Take 1 tablet (50 mg total) by mouth every 8 (eight) hours as needed. (Patient taking differently: Take 50 mg by mouth every 8 (eight) hours as needed (FOR PAIN). )    12 system ROS was negative unless otherwise noted in HPI   Observations/Objective:  AAA Duplex (01-05-19): Abdominal Aorta Findings: +-----------+-------+----------+----------+----------+--------+--------+ Location   AP (cm)Trans (cm)PSV (cm/s)Waveform  ThrombusComments +-----------+-------+----------+----------+----------+--------+--------+ Proximal   2.43   2.40      56        monophasic                 +-----------+-------+----------+----------+----------+--------+--------+ Mid to dst 5.12   5.00      33        biphasic  Present          +-----------+-------+----------+----------+----------+--------+--------+ Distal     4.40   4.69      17        biphasic                   +-----------+-------+----------+----------+----------+--------+--------+ RT CIA Prox1.2    1.0       111       triphasic                  +-----------+-------+----------+----------+----------+--------+--------+ LT CIA Prox1.4    1.4       68        triphasic                   +-----------+-------+----------+----------+----------+--------+--------+ Summary: Abdominal Aorta: The largest aortic diameter has increased (5.12 x 5.00 cm ) compared to prior exam. Previous diameter measurement was 4.5 x 4.94 cm obtained on 07/03/2017. The bilateral proximal iliac arteries not well visualized due to bowel gas.    Assessment and Plan: Asymptomatic stable AAA at 5.1cm.   Her risk factors for expansion of her AAA include smoking since age 89, CAD, and family history of AAA in her mother and maternal aunt. Over 3 minutes was spent counseling patient re smoking cessation, and patient was given several free resources re smoking cessation.   Follow Up Instructions:   Follow up in 6 months with AAA duplex.   I discussed the assessment and treatment plan with the patient. The patient was provided an opportunity to ask questions and all were answered. The patient agreed with the plan and demonstrated an understanding of the instructions.   The patient was advised to call back or seek an in-person evaluation if the symptoms worsen or if the condition fails to improve as anticipated.  I spent 10 minutes with the patient via telephone encounter.   Gabrielle Dare  [facility] Office: [phone]  01/13/2019, 10:16 AM

## 2019-01-13 NOTE — Patient Instructions (Signed)
Before your next abdominal ultrasound:  Avoid gas forming foods and beverages the day before the test.   Take two Extra-Strength Gas-X capsules at bedtime the night before the test. Take another two Extra-Strength Gas-X capsules in the middle of the night if you get up to the restroom, if not, first thing in the morning with water.  Do not chew gum.     Abdominal Aortic Aneurysm  An aneurysm is a bulge in one of the blood vessels that carry blood away from the heart (artery). It happens when blood pushes up against a weak or damaged place in the wall of an artery. An abdominal aortic aneurysm happens in the main artery of the body (aorta). Some aneurysms may not cause problems. If it grows, it can burst or tear, causing bleeding inside the body. This is an emergency. It needs to be treated right away. What are the causes? The exact cause of this condition is not known. What increases the risk? The following may make you more likely to get this condition:  Being a female who is 83 years of age or older.  Being white (Caucasian).  Using tobacco.  Having a family history of aneurysms.  Having the following conditions: ? Hardening of the arteries (arteriosclerosis). ? Inflammation of the walls of an artery (arteritis). ? Certain genetic conditions. ? Being very overweight (obesity). ? An infection in the wall of the aorta (infectious aortitis). ? High cholesterol. ? High blood pressure (hypertension). What are the signs or symptoms? Symptoms depend on the size of the aneurysm and how fast it is growing. Most grow slowly and do not cause any symptoms. If symptoms do occur, they may include:  Pain in the belly (abdomen), side, or back.  Feeling full after eating only small amounts of food.  Feeling a throbbing lump in the belly. Symptoms that the aneurysm has burst (ruptured) include:  Sudden, very bad pain in the belly, side, or back.  Feeling sick to your stomach (nauseous).   Throwing up (vomiting).  Feeling light-headed or passing out. How is this treated? Treatment for this condition depends on:  The size of the aneurysm.  How fast it is growing.  Your age.  Your risk of having it burst. If your aneurysm is smaller than 2 inches (5 cm), your doctor may manage it by:  Checking it often to see if it is getting bigger. You may have an imaging test (ultrasound) to check it every 3-6 months, every year, or every few years.  Giving you medicines to: ? Control blood pressure. ? Treat pain. ? Fight infection. If your aneurysm is larger than 2 inches (5 cm), you may need surgery to fix it. Follow these instructions at home: Lifestyle  Do not use any products that have nicotine or tobacco in them. This includes cigarettes, e-cigarettes, and chewing tobacco. If you need help quitting, ask your doctor.  Get regular exercise. Ask your doctor what types of exercise are best for you. Eating and drinking  Eat a heart-healthy diet. This includes eating plenty of: ? Fresh fruits and vegetables. ? Whole grains. ? Low-fat (lean) protein. ? Low-fat dairy products.  Avoid foods that are high in saturated fat and cholesterol. These foods include red meat and some dairy products.  Do not drink alcohol if: ? Your doctor tells you not to drink. ? You are pregnant, may be pregnant, or are planning to become pregnant.  If you drink alcohol: ? Limit how much you use  to:  0-1 drink a day for women.  0-2 drinks a day for men. ? Be aware of how much alcohol is in your drink. In the [place] one drink equals any of these:  One typical bottle of beer (12 oz).  One-half glass of wine (5 oz).  One shot of hard liquor (1 oz). General instructions  Take over-the-counter and prescription medicines only as told by your doctor.  Keep your blood pressure within normal limits. Ask your doctor what your blood pressure should be.  Have your blood sugar (glucose) level  and cholesterol levels checked regularly. Keep your blood sugar level and cholesterol levels within normal limits.  Avoid heavy lifting and activities that take a lot of effort. Ask your doctor what activities are safe for you.  Keep all follow-up visits as told by your doctor. This is important. ? Talk to your doctor about regular screenings to see if the aneurysm is getting bigger. Contact a doctor if you:  Have pain in your belly, side, or back.  Have a throbbing feeling in your belly.  Have a family history of aneurysms. Get help right away if you:  Have sudden, bad pain in your belly, side, or back.  Feel sick to your stomach.  Throw up.  Have trouble pooping (constipation).  Have trouble peeing (urinating).  Feel light-headed.  Have a fast heart rate when you stand.  Have sweaty skin that is cold to the touch (clammy).  Have shortness of breath.  Have a fever. These symptoms may be an emergency. Do not wait to see if the symptoms will go away. Get medical help right away. Call your local emergency services (911 in the [place] Do not drive yourself to the hospital. Summary  An aneurysm is a bulge in one of the blood vessels that carry blood away from the heart (artery). Some aneurysms may not cause problems.  You may need to have yours checked often. If it grows, it can burst or tear. This causes bleeding inside the body. It needs to be treated right away.  Follow instructions from your doctor about healthy lifestyle changes.  Keep all follow-up visits as told by your doctor. This is important. This information is not intended to replace advice given to you by your health care provider. Make sure you discuss any questions you have with your health care provider. Document Released: 09/14/2012 Document Revised: 09/07/2018 Document Reviewed: 12/27/2017 Elsevier Patient Education  2020 [employer].     Steps to Quit Smoking Smoking tobacco is the leading cause of  preventable death. It can affect almost every organ in the body. Smoking puts you and people around you at risk for many serious, long-lasting (chronic) diseases. Quitting smoking can be hard, but it is one of the best things that you can do for your health. It is never too late to quit. How do I get ready to quit? When you decide to quit smoking, make a plan to help you succeed. Before you quit:  Pick a date to quit. Set a date within the next 2 weeks to give you time to prepare.  Write down the reasons why you are quitting. Keep this list in places where you will see it often.  Tell your family, friends, and co-workers that you are quitting. Their support is important.  Talk with your doctor about the choices that may help you quit.  Find out if your health insurance will pay for these treatments.  Know the people, places,  things, and activities that make you want to smoke (triggers). Avoid them. What first steps can I take to quit smoking?  Throw away all cigarettes at home, at work, and in your car.  Throw away the things that you use when you smoke, such as ashtrays and lighters.  Clean your car. Make sure to empty the ashtray.  Clean your home, including curtains and carpets. What can I do to help me quit smoking? Talk with your doctor about taking medicines and seeing a counselor at the same time. You are more likely to succeed when you do both.  If you are pregnant or breastfeeding, talk with your doctor about counseling or other ways to quit smoking. Do not take medicine to help you quit smoking unless your doctor tells you to do so. To quit smoking: Quit right away  Quit smoking totally, instead of slowly cutting back on how much you smoke over a period of time.  Go to counseling. You are more likely to quit if you go to counseling sessions regularly. Take medicine You may take medicines to help you quit. Some medicines need a prescription, and some you can buy  over-the-counter. Some medicines may contain a drug called nicotine to replace the nicotine in cigarettes. Medicines may:  Help you to stop having the desire to smoke (cravings).  Help to stop the problems that come when you stop smoking (withdrawal symptoms). Your doctor may ask you to use:  Nicotine patches, gum, or lozenges.  Nicotine inhalers or sprays.  Non-nicotine medicine that is taken by mouth. Find resources Find resources and other ways to help you quit smoking and remain smoke-free after you quit. These resources are most helpful when you use them often. They include:  Online chats with a Social worker.  Phone quitlines.  Printed Furniture conservator/restorer.  Support groups or group counseling.  Text messaging programs.  Mobile phone apps. Use apps on your mobile phone or tablet that can help you stick to your quit plan. There are many free apps for mobile phones and tablets as well as websites. Examples include Quit Guide from the [employer] and smokefree.gov  What things can I do to make it easier to quit?   Talk to your family and friends. Ask them to support and encourage you.  Call a phone quitline (1-800-QUIT-NOW), reach out to support groups, or work with a Social worker.  Ask people who smoke to not smoke around you.  Avoid places that make you want to smoke, such as: ? Bars. ? Parties. ? Smoke-break areas at work.  Spend time with people who do not smoke.  Lower the stress in your life. Stress can make you want to smoke. Try these things to help your stress: ? Getting regular exercise. ? Doing deep-breathing exercises. ? Doing yoga. ? Meditating. ? Doing a body scan. To do this, close your eyes, focus on one area of your body at a time from head to toe. Notice which parts of your body are tense. Try to relax the muscles in those areas. How will I feel when I quit smoking? Day 1 to 3 weeks Within the first 24 hours, you may start to have some problems that come from  quitting tobacco. These problems are very bad 2-3 days after you quit, but they do not often last for more than 2-3 weeks. You may get these symptoms:  Mood swings.  Feeling restless, nervous, angry, or annoyed.  Trouble concentrating.  Dizziness.  Strong desire for high-sugar  foods and nicotine.  Weight gain.  Trouble pooping (constipation).  Feeling like you may vomit (nausea).  Coughing or a sore throat.  Changes in how the medicines that you take for other issues work in your body.  Depression.  Trouble sleeping (insomnia). Week 3 and afterward After the first 2-3 weeks of quitting, you may start to notice more positive results, such as:  Better sense of smell and taste.  Less coughing and sore throat.  Slower heart rate.  Lower blood pressure.  Clearer skin.  Better breathing.  Fewer sick days. Quitting smoking can be hard. Do not give up if you fail the first time. Some people need to try a few times before they succeed. Do your best to stick to your quit plan, and talk with your doctor if you have any questions or concerns. Summary  Smoking tobacco is the leading cause of preventable death. Quitting smoking can be hard, but it is one of the best things that you can do for your health.  When you decide to quit smoking, make a plan to help you succeed.  Quit smoking right away, not slowly over a period of time.  When you start quitting, seek help from your doctor, family, or friends. This information is not intended to replace advice given to you by your health care provider. Make sure you discuss any questions you have with your health care provider. Document Released: 03/16/2009 Document Revised: 08/07/2018 Document Reviewed: 08/08/2018 Elsevier Patient Education  2020 [employer].

## 2019-01-26 ENCOUNTER — Other Ambulatory Visit: Payer: Self-pay | Admitting: Internal Medicine

## 2019-01-29 ENCOUNTER — Encounter: Payer: Self-pay | Admitting: Cardiovascular Disease

## 2019-01-29 ENCOUNTER — Other Ambulatory Visit: Payer: Self-pay

## 2019-01-29 ENCOUNTER — Ambulatory Visit: Admission: RE | Admit: 2019-01-29 | Payer: Medicare Other | Source: Ambulatory Visit

## 2019-01-29 ENCOUNTER — Ambulatory Visit (INDEPENDENT_AMBULATORY_CARE_PROVIDER_SITE_OTHER): Payer: Medicare Other | Admitting: Cardiovascular Disease

## 2019-01-29 DIAGNOSIS — Z8679 Personal history of other diseases of the circulatory system: Secondary | ICD-10-CM | POA: Diagnosis not present

## 2019-01-29 DIAGNOSIS — Z9861 Coronary angioplasty status: Secondary | ICD-10-CM

## 2019-01-29 DIAGNOSIS — I10 Essential (primary) hypertension: Secondary | ICD-10-CM

## 2019-01-29 DIAGNOSIS — F17200 Nicotine dependence, unspecified, uncomplicated: Secondary | ICD-10-CM

## 2019-01-29 DIAGNOSIS — E785 Hyperlipidemia, unspecified: Secondary | ICD-10-CM

## 2019-01-29 DIAGNOSIS — I714 Abdominal aortic aneurysm, without rupture, unspecified: Secondary | ICD-10-CM

## 2019-01-29 DIAGNOSIS — I350 Nonrheumatic aortic (valve) stenosis: Secondary | ICD-10-CM

## 2019-01-29 DIAGNOSIS — I2510 Atherosclerotic heart disease of native coronary artery without angina pectoris: Secondary | ICD-10-CM

## 2019-01-29 NOTE — Assessment & Plan Note (Signed)
History of dyslipidemia on statin therapy with lipid profile performed 01/06/2019 revealing cholesterol 130, LDL 75 and HDL 35.

## 2019-01-29 NOTE — Assessment & Plan Note (Signed)
History of essential hypertension with blood pressure measured today at 150/80.  She is on amlodipine, losartan and metoprolol.

## 2019-01-29 NOTE — Assessment & Plan Note (Signed)
History of mild right ICA stenosis by duplex ultrasound performed 01/17/2017.  I believe her bruits are related to transmission of her aortic stenosis murmur.

## 2019-01-29 NOTE — Patient Instructions (Signed)
Medication Instructions:  Your physician recommends that you continue on your current medications as directed. Please refer to the Current Medication list given to you today.  If you need a refill on your cardiac medications before your next appointment, please call your pharmacy.   Lab work: NONE If you have labs (blood work) drawn today and your tests are completely normal, you will receive your results only by: Marland Kitchen MyChart Message (if you have MyChart) OR . A paper copy in the mail If you have any lab test that is abnormal or we need to change your treatment, we will call you to review the results.  Testing/Procedures: Your physician has requested that you have an echocardiogram. Echocardiography is a painless test that uses sound waves to create images of your heart. It provides your doctor with information about the size and shape of your heart and how well your heart's chambers and valves are working. This procedure takes approximately one hour. There are no restrictions for this procedure.LOCATION: [facility]: [address], [place], [place] [address]  TO BE SCHEDULED FOR 12 MONTHS   Follow-Up: At [facility], you and your health needs are our priority.  As part of our continuing mission to provide you with exceptional heart care, we have created designated Provider Care Teams.  These Care Teams include your primary Cardiologist (physician) and Advanced Practice Providers (APPs -  Physician Assistants and Nurse Practitioners) who all work together to provide you with the care you need, when you need it. . You will need a follow up appointment in 6 months WITH AN APP AND IN 12 MONTHS with Dr. Quay Burow.  Please call our office 2 months in advance to schedule each appointment.  You may see Dr. Gwenlyn Found or one of the following Advanced Practice Providers on your designated Care Team:   . Kerin Ransom, PA-C . Daleen Snook Kroeger, PA-C . Sande Rives, PA-C . Almyra Deforest,  PA-C . Fabian Sharp, PA-C . Jory Sims, DNP . Rosaria Ferries, PA-C

## 2019-01-29 NOTE — Assessment & Plan Note (Signed)
History of abdominal aortic aneurysm measuring approximately 5 cm followed by Dr. Donnetta Hutching.

## 2019-01-29 NOTE — Progress Notes (Signed)
01/29/2019 Tammy Brooks   27-Sep-1933  [id number]  Primary Physician Plotnikov, Evie Lacks, MD Primary Cardiologist: Lorretta Harp MD FACP, [place], [place], [place]  HPI:  Tammy Brooks is a 85 y.o.   mildly overweight widowed Caucasian female mother of 31, grandmother of 79 grandchildren well last saw  virtually 09/09/2018. She has a history of mild CAD by cath in 1997. Her lipid problems include continued tobacco abuse one pack per day, hypertension, and hyperlipidemia all medically treated. She does have mild to moderate aortic stenosis with valve area of 1 cm2 by 2-D echo one year ago.. She also has moderate right internal carotid artery stenosis by duplex ultrasound. She was recently diagnosed with a small abdominal aortic and iliac aneurysm followed by Dr. Donnetta Hutching. She denies chest pain or shortness of breath. She was found to have a pelvic mass which was surgically excised last year and found to be benign. She does have a hernia as result of this. She has been seen in the emergency room once for chest pain and rule out PE. CTA was negative for this although it did show coronary calcification consistent with atherosclerosis. She was scheduled for a Myoview stress test which has been delayed until September because of fiscalconstraints. She does continue to smoke a pack a day. She currently denies chest pain or shortness of breath.  Since I saw her 3 months ago a follow-up echo shown stable moderate aortic stenosis with normal LV function.  She saw Kerin Ransom in the office 02/11/2018 who began her on indoor which it has resulted in marked improvement in her chest pain.  She also has a moderate abdominal aortic aneurysm followed by Dr. Donnetta Hutching measuring 5 cm.  Since I saw her 4 months ago she is remained stable.  She denies chest pain or shortness of breath.  Her 2D echocardiogram performed 12/02/2018 showed continued neck narrowing of her aortic valve now measuring 0.69 cm with a peak gradient of 53  mmHg although she continues to be asymptomatic from this.   Current Meds  Medication Sig  . allopurinol (ZYLOPRIM) 100 MG tablet Take 1 tablet (100 mg total) by mouth daily.  Marland Kitchen amLODipine (NORVASC) 5 MG tablet Take 1 tablet (5 mg total) by mouth daily.  Marland Kitchen atorvastatin (LIPITOR) 40 MG tablet TAKE 2 TABLETS BY MOUTH  DAILY  . cholecalciferol (VITAMIN D) 1000 units tablet Take 1,000 Units by mouth daily.  . clopidogrel (PLAVIX) 75 MG tablet Take 1 tablet (75 mg total) by mouth daily.  . colchicine 0.6 MG tablet Take 1 tablet (0.6 mg total) by mouth 4 (four) times daily as needed. (Patient taking differently: Take 0.6 mg by mouth 4 (four) times daily as needed (for gout flares). )  . furosemide (LASIX) 20 MG tablet TAKE 1-2 TABLETS BY MOUTH  DAILY AS NEEDED FOR EDEMA. (Patient taking differently: Take 20-40 mg by mouth daily as needed for edema. )  . isosorbide mononitrate (IMDUR) 30 MG 24 hr tablet TAKE 1 TABLET BY MOUTH  DAILY  . levothyroxine (SYNTHROID) 50 MCG tablet TAKE 1 TABLET BY MOUTH  DAILY  . losartan (COZAAR) 25 MG tablet Take 1 tablet (25 mg total) by mouth daily.  . metoprolol tartrate (LOPRESSOR) 25 MG tablet TAKE ONE-HALF TABLET BY  MOUTH TWO TIMES DAILY  . Multiple Vitamins-Minerals (PRESERVISION/LUTEIN) CAPS Take 1 capsule by mouth daily with breakfast.  . naproxen sodium (ALEVE) 220 MG tablet Take 440 mg by mouth 2 (two) times daily  as needed (for back pain).   . nitroGLYCERIN (NITROSTAT) 0.4 MG SL tablet Place 0.4 mg under the tongue every 5 (five) minutes as needed for chest pain.  Marland Kitchen tiZANidine (ZANAFLEX) 4 MG tablet Take 1 tablet (4 mg total) by mouth every 8 (eight) hours as needed for muscle spasms.  . traMADol (ULTRAM) 50 MG tablet Take 1 tablet (50 mg total) by mouth every 8 (eight) hours as needed. (Patient taking differently: Take 50 mg by mouth every 8 (eight) hours as needed (FOR PAIN). )     Allergies  Allergen Reactions  . Nabumetone Anaphylaxis, Swelling and  Other (See Comments)    Causes inflammation of airways  . Tape Other (See Comments)    SKIN IS VERY THIN AND TEARS EASILY- PLEASE USE AN ALTERNATIVE TO TAPE, IF POSSIBLE!!    Social History   Socioeconomic History  . Marital status: Widowed    Spouse name: Not on file  . Number of children: 5  . Years of education: 9  . Highest education level: 9th grade  Occupational History  . Occupation: retired    Comment: retired  Scientific laboratory technician  . Financial resource strain: Not hard at all  . Food insecurity    Worry: Never true    Inability: Never true  . Transportation needs    Medical: No    Non-medical: No  Tobacco Use  . Smoking status: Current Every Day Smoker    Packs/day: 0.50    Years: 62.00    Pack years: 31.00    Types: Cigarettes  . Smokeless tobacco: Never Used  Substance and Sexual Activity  . Alcohol use: Yes    Alcohol/week: 0.0 standard drinks    Comment: occasionally - few drinks/month  . Drug use: No  . Sexual activity: Never    Partners: Male    Birth control/protection: None, Surgical  Lifestyle  . Physical activity    Days per week: 0 days    Minutes per session: 0 min  . Stress: Not at all  Relationships  . Social connections    Talks on phone: More than three times a week    Gets together: More than three times a week    Attends religious service: Not on file    Active member of club or organization: Not on file    Attends meetings of clubs or organizations: Not on file    Relationship status: Widowed  . Intimate partner violence    Fear of current or ex partner: Not on file    Emotionally abused: Not on file    Physically abused: Not on file    Forced sexual activity: Not on file  Other Topics Concern  . Not on file  Social History Narrative   9th grade. married '54- widowed Dec 10th'08. 4 sons - '55, '58, '60, '69; 1 daughter - '63; 12 grandchildren; 9 great-grandchildren. 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 Live - own home, one son  lives with her. Other children are close and attentive. ACP/End-of-life: No CPR, no mechanical ventilation, no futile/heroic measures.     Review of Systems: General: negative for chills, fever, night sweats or weight changes.  Cardiovascular: negative for chest pain, dyspnea on exertion, edema, orthopnea, palpitations, paroxysmal nocturnal dyspnea or shortness of breath Dermatological: negative for rash Respiratory: negative for cough or wheezing Urologic: negative for hematuria Abdominal: negative for nausea, vomiting, diarrhea, bright red blood per rectum, melena, or hematemesis Neurologic: negative for visual changes, syncope, or dizziness All  other systems reviewed and are otherwise negative except as noted above.    Blood pressure (!) 150/80, pulse 60, temperature (!) 96.3 F (35.7 C), height 5\' 3"  (1.6 m), weight 133 lb (60.3 kg).  General appearance: alert and no distress Neck: no adenopathy, no JVD, supple, symmetrical, trachea midline, thyroid not enlarged, symmetric, no tenderness/mass/nodules and Soft bilateral carotid bruits Lungs: clear to auscultation bilaterally Heart: 2/6 outflow tract murmur consistent with aortic stenosis Extremities: extremities normal, atraumatic, no cyanosis or edema Pulses: 2+ and symmetric Skin: Skin color, texture, turgor normal. No rashes or lesions Neurologic: Alert and oriented X 3, normal strength and tone. Normal symmetric reflexes. Normal coordination and gait  EKG not performed today  ASSESSMENT AND PLAN:   Hx of carotid artery stenosis, RT. -moderate, followed by Dr. Gwenlyn Found, Last dopplers 02/2012 History of mild right ICA stenosis by duplex ultrasound performed 01/17/2017.  I believe her bruits are related to transmission of her aortic stenosis murmur.  Dyslipidemia History of dyslipidemia on statin therapy with lipid profile performed 01/06/2019 revealing cholesterol 130, LDL 75 and HDL 35.  Essential hypertension History of essential  hypertension with blood pressure measured today at 150/80.  She is on amlodipine, losartan and metoprolol.  AAA (abdominal aortic aneurysm) without rupture History of abdominal aortic aneurysm measuring approximately 5 cm followed by Dr. Donnetta Hutching.  Tobacco dependence History of ongoing tobacco abuse smoking 1/2 pack/day recalcitrant to respect modification  CAD S/P percutaneous coronary angioplasty History of CAD status post STEMI at [facility] 01/25/2018 with placement of a stent in the circumflex and obtuse marginal branch by Dr. Sherrian Divers.  She had moderate diagonal and RCA disease as well which were treated medically.  She denies chest pain or shortness of breath.  Moderate to severe aortic stenosis History of moderate aortic stenosis which is somewhat progressed by her recent 2D echocardiogram performed 12/02/2018.  Her EF was 50 to 55% with a valve area which decreased 2.69 cm from 0.96 cm.  She had a peak aortic gradient of 53 mmHg.  She is asymptomatic from this and we will recheck this in 1 year.      Lorretta Harp MD FACP,FACC,FAHA, [facility] 01/29/2019 10:25 AM

## 2019-01-29 NOTE — Assessment & Plan Note (Signed)
History of moderate aortic stenosis which is somewhat progressed by her recent 2D echocardiogram performed 12/02/2018.  Her EF was 50 to 55% with a valve area which decreased 2.69 cm from 0.96 cm.  She had a peak aortic gradient of 53 mmHg.  She is asymptomatic from this and we will recheck this in 1 year.

## 2019-01-29 NOTE — Assessment & Plan Note (Signed)
History of ongoing tobacco abuse smoking 1/2 pack/day recalcitrant to respect modification

## 2019-01-29 NOTE — Assessment & Plan Note (Signed)
History of CAD status post STEMI at [facility] 01/25/2018 with placement of a stent in the circumflex and obtuse marginal branch by Dr. Sherrian Divers.  She had moderate diagonal and RCA disease as well which were treated medically.  She denies chest pain or shortness of breath.

## 2019-02-05 ENCOUNTER — Telehealth: Payer: Self-pay | Admitting: Internal Medicine

## 2019-02-05 DIAGNOSIS — Z1231 Encounter for screening mammogram for malignant neoplasm of breast: Secondary | ICD-10-CM | POA: Diagnosis not present

## 2019-02-05 NOTE — Telephone Encounter (Signed)
What alternative do they have? Do other pharmacies have allopurinol? Thanks

## 2019-02-05 NOTE — Telephone Encounter (Signed)
Copied from [place] [id number]. Topic: Quick Communication - Rx Refill/Question >> Feb 05, 2019 11:09 AM Rainey Pines A wrote: Medication: allopurinol (ZYLOPRIM) 100 MG tablet (Patient stated that pharmacy informed her that medication is out of stock and they do not know when it will be back in stock and that provider would need to prescribe an alternative. Patient would like a callback from nurse once alternative is sent in.)  Has the patient contacted their pharmacy? Yes (Agent: If no, request that the patient contact the pharmacy for the refill.) (Agent: If yes, when and what did the pharmacy advise?)Contact PCP  Preferred Pharmacy (with phone number or street name): [place], [place] [phone] (Phone) [phone] (Fax)    Agent: Please be advised that RX refills may take up to 3 business days. We ask that you follow-up with your pharmacy.

## 2019-02-05 NOTE — Telephone Encounter (Signed)
allopurinol (ZYLOPRIM) 100 MG tablet (Patient stated that pharmacy informed her that medication is out of stock and they do not know when it will be back in stock and that provider would need to prescribe an alternative. Patient would like a callback from nurse once alternative is sent in.)

## 2019-02-09 MED ORDER — ALLOPURINOL 100 MG PO TABS: 100.0000 mg | ORAL_TABLET | Freq: Every day | ORAL | 0 refills | Status: DC

## 2019-02-09 NOTE — Telephone Encounter (Signed)
I called pt- she does not know any details. 90 day supply sent to local [facility] per pt's request.

## 2019-02-15 ENCOUNTER — Telehealth: Payer: Self-pay | Admitting: Internal Medicine

## 2019-02-15 NOTE — Telephone Encounter (Signed)
Patient called stating that she is having a bone density on 02/23/2019 at 2:00pm and would like to have her flu shot at the same time.  An appointment has been made on the NURSE scheduled for this. FYI

## 2019-02-23 ENCOUNTER — Other Ambulatory Visit: Payer: Self-pay

## 2019-02-23 ENCOUNTER — Ambulatory Visit (INDEPENDENT_AMBULATORY_CARE_PROVIDER_SITE_OTHER): Payer: Medicare Other

## 2019-02-23 ENCOUNTER — Ambulatory Visit (INDEPENDENT_AMBULATORY_CARE_PROVIDER_SITE_OTHER)
Admission: RE | Admit: 2019-02-23 | Discharge: 2019-02-23 | Disposition: A | Discharge status: Home / Self Care | Payer: Medicare Other | Source: Ambulatory Visit | Attending: Internal Medicine | Admitting: Internal Medicine

## 2019-02-23 DIAGNOSIS — N959 Unspecified menopausal and perimenopausal disorder: Secondary | ICD-10-CM | POA: Diagnosis not present

## 2019-02-23 DIAGNOSIS — Z23 Encounter for immunization: Secondary | ICD-10-CM | POA: Diagnosis not present

## 2019-03-04 DIAGNOSIS — H353131 Nonexudative age-related macular degeneration, bilateral, early dry stage: Secondary | ICD-10-CM | POA: Diagnosis not present

## 2019-03-11 ENCOUNTER — Other Ambulatory Visit: Payer: Self-pay | Admitting: Cardiovascular Disease

## 2019-07-08 ENCOUNTER — Other Ambulatory Visit: Payer: Self-pay | Admitting: Cardiovascular Disease

## 2019-07-12 ENCOUNTER — Ambulatory Visit (INDEPENDENT_AMBULATORY_CARE_PROVIDER_SITE_OTHER): Payer: Medicare Other | Admitting: Internal Medicine

## 2019-07-12 ENCOUNTER — Other Ambulatory Visit: Payer: Self-pay

## 2019-07-12 ENCOUNTER — Encounter: Payer: Self-pay | Admitting: Internal Medicine

## 2019-07-12 VITALS — BP 146/84 | HR 70 | Temp 97.8°F | Ht 63.0 in | Wt 130.0 lb

## 2019-07-12 DIAGNOSIS — N182 Chronic kidney disease, stage 2 (mild): Secondary | ICD-10-CM

## 2019-07-12 DIAGNOSIS — I10 Essential (primary) hypertension: Secondary | ICD-10-CM

## 2019-07-12 DIAGNOSIS — E559 Vitamin D deficiency, unspecified: Secondary | ICD-10-CM | POA: Diagnosis not present

## 2019-07-12 DIAGNOSIS — I2510 Atherosclerotic heart disease of native coronary artery without angina pectoris: Secondary | ICD-10-CM

## 2019-07-12 DIAGNOSIS — E785 Hyperlipidemia, unspecified: Secondary | ICD-10-CM

## 2019-07-12 DIAGNOSIS — I350 Nonrheumatic aortic (valve) stenosis: Secondary | ICD-10-CM | POA: Diagnosis not present

## 2019-07-12 DIAGNOSIS — Z9861 Coronary angioplasty status: Secondary | ICD-10-CM

## 2019-07-12 LAB — BASIC METABOLIC PANEL
BUN: 15 mg/dL (ref 6–23)
CO2: 27 mEq/L (ref 19–32)
Calcium: 9.3 mg/dL (ref 8.4–10.5)
Chloride: 106 mEq/L (ref 96–112)
Creatinine, Ser: 1.04 mg/dL (ref 0.40–1.20)
GFR: 50.51 mL/min — ABNORMAL LOW (ref >60.00)
Glucose, Bld: 92 mg/dL (ref 70–99)
Potassium: 4 mEq/L (ref 3.5–5.1)
Sodium: 140 mEq/L (ref 135–145)

## 2019-07-12 LAB — VITAMIN D 25 HYDROXY (VIT D DEFICIENCY, FRACTURES): VITD: 20.51 ng/mL — ABNORMAL LOW (ref 30.00–100.00)

## 2019-07-12 LAB — HEPATIC FUNCTION PANEL
ALT: 9 U/L (ref 0–35)
AST: 16 U/L (ref 0–37)
Albumin: 3.8 g/dL (ref 3.5–5.2)
Alkaline Phosphatase: 60 U/L (ref 39–117)
Bilirubin, Direct: 0.1 mg/dL (ref 0.0–0.3)
Total Bilirubin: 0.5 mg/dL (ref 0.2–1.2)
Total Protein: 6.5 g/dL (ref 6.0–8.3)

## 2019-07-12 LAB — TSH: TSH: 7.68 u[IU]/mL — ABNORMAL HIGH (ref 0.35–4.50)

## 2019-07-12 LAB — LIPID PANEL
Cholesterol: 122 mg/dL (ref 0–200)
HDL: 32.2 mg/dL — ABNORMAL LOW (ref >39.00)
LDL Cholesterol: 71 mg/dL (ref 0–99)
NonHDL: 89.73
Total CHOL/HDL Ratio: 4
Triglycerides: 94 mg/dL (ref 0.0–149.0)
VLDL: 18.8 mg/dL (ref 0.0–40.0)

## 2019-07-12 LAB — T4, FREE: Free T4: 1.24 ng/dL (ref 0.60–1.60)

## 2019-07-12 MED ORDER — METOPROLOL TARTRATE 25 MG PO TABS: ORAL_TABLET | ORAL | 3 refills | Status: DC

## 2019-07-12 NOTE — Assessment & Plan Note (Signed)
On Toprol, Plavix, Lipitor, Amlodipine, Imdur No angina

## 2019-07-12 NOTE — Assessment & Plan Note (Signed)
Labs

## 2019-07-12 NOTE — Assessment & Plan Note (Signed)
F/u w/Cardiology 

## 2019-07-12 NOTE — Progress Notes (Signed)
Subjective:  Patient ID: Tammy Brooks, female    DOB: [birth year]  Age: 86 y.o. MRN: [id number]  CC: No chief complaint on file.   HPI DESHAWNA MCNEECE presents for HTN, AS, CAD, COPD BP ok at home  Outpatient Medications Prior to Visit  Medication Sig Dispense Refill  . allopurinol (ZYLOPRIM) 100 MG tablet Take 1 tablet (100 mg total) by mouth daily. 90 tablet 0  . amLODipine (NORVASC) 5 MG tablet TAKE 1 TABLET BY MOUTH  DAILY 90 tablet 1  . atorvastatin (LIPITOR) 40 MG tablet TAKE 2 TABLETS BY MOUTH  DAILY 180 tablet 3  . cholecalciferol (VITAMIN D) 1000 units tablet Take 1,000 Units by mouth daily.    . clopidogrel (PLAVIX) 75 MG tablet Take 1 tablet (75 mg total) by mouth daily. 90 tablet 3  . colchicine 0.6 MG tablet Take 1 tablet (0.6 mg total) by mouth 4 (four) times daily as needed. (Patient taking differently: Take 0.6 mg by mouth 4 (four) times daily as needed (for gout flares). ) 360 tablet 2  . furosemide (LASIX) 20 MG tablet TAKE 1-2 TABLETS BY MOUTH  DAILY AS NEEDED FOR EDEMA. (Patient taking differently: Take 20-40 mg by mouth daily as needed for edema. ) 180 tablet 1  . isosorbide mononitrate (IMDUR) 30 MG 24 hr tablet TAKE 1 TABLET BY MOUTH  DAILY 90 tablet 3  . levothyroxine (SYNTHROID) 50 MCG tablet TAKE 1 TABLET BY MOUTH  DAILY 90 tablet 3  . losartan (COZAAR) 25 MG tablet TAKE 1 TABLET BY MOUTH  DAILY 90 tablet 1  . metoprolol tartrate (LOPRESSOR) 25 MG tablet TAKE ONE-HALF TABLET BY  MOUTH TWO TIMES DAILY 90 tablet 3  . Multiple Vitamins-Minerals (PRESERVISION/LUTEIN) CAPS Take 1 capsule by mouth daily with breakfast.    . naproxen sodium (ALEVE) 220 MG tablet Take 440 mg by mouth 2 (two) times daily as needed (for back pain).     . nitroGLYCERIN (NITROSTAT) 0.4 MG SL tablet Place 0.4 mg under the tongue every 5 (five) minutes as needed for chest pain.    Marland Kitchen tiZANidine (ZANAFLEX) 4 MG tablet Take 1 tablet (4 mg total) by mouth every 8 (eight) hours as needed for muscle  spasms. 90 tablet 2  . traMADol (ULTRAM) 50 MG tablet Take 1 tablet (50 mg total) by mouth every 8 (eight) hours as needed. (Patient taking differently: Take 50 mg by mouth every 8 (eight) hours as needed (FOR PAIN). ) 100 tablet 2   No facility-administered medications prior to visit.    ROS: Review of Systems  Constitutional: Negative for activity change, appetite change, chills, fatigue and unexpected weight change.  HENT: Negative for congestion, mouth sores and sinus pressure.   Eyes: Negative for visual disturbance.  Respiratory: Negative for cough and chest tightness.   Gastrointestinal: Negative for abdominal pain and nausea.  Genitourinary: Negative for difficulty urinating, frequency and vaginal pain.  Musculoskeletal: Positive for arthralgias. Negative for back pain and gait problem.  Skin: Negative for pallor and rash.  Neurological: Negative for dizziness, tremors, weakness, numbness and headaches.  Psychiatric/Behavioral: Negative for confusion and sleep disturbance.    Objective:  BP (!) 146/84 (BP Location: Left Arm, Patient Position: Sitting, Cuff Size: Normal)   Pulse 70   Temp 97.8 F (36.6 C) (Oral)   Ht 5\' 3"  (1.6 m)   Wt 130 lb (59 kg)   SpO2 97%   BMI 23.03 kg/m   BP Readings from Last 3 Encounters:  07/12/19 Marland Kitchen)  146/84  01/29/19 (!) 150/80  01/06/19 140/90    Wt Readings from Last 3 Encounters:  07/12/19 130 lb (59 kg)  01/29/19 133 lb (60.3 kg)  01/06/19 138 lb (62.6 kg)    Physical Exam Constitutional:      General: She is not in acute distress.    Appearance: She is well-developed.  HENT:     Head: Normocephalic.     Right Ear: External ear normal.     Left Ear: External ear normal.     Nose: Nose normal.  Eyes:     General:        Right eye: No discharge.        Left eye: No discharge.     Conjunctiva/sclera: Conjunctivae normal.     Pupils: Pupils are equal, round, and reactive to light.  Neck:     Thyroid: No thyromegaly.      Vascular: No JVD.     Trachea: No tracheal deviation.  Cardiovascular:     Rate and Rhythm: Normal rate and regular rhythm.     Heart sounds: Normal heart sounds.  Pulmonary:     Effort: No respiratory distress.     Breath sounds: No stridor. No wheezing.  Abdominal:     General: Bowel sounds are normal. There is no distension.     Palpations: Abdomen is soft. There is no mass.     Tenderness: There is no abdominal tenderness. There is no guarding or rebound.  Musculoskeletal:        General: No tenderness.     Cervical back: Normal range of motion and neck supple.  Lymphadenopathy:     Cervical: No cervical adenopathy.  Skin:    Findings: No erythema or rash.  Neurological:     Cranial Nerves: No cranial nerve deficit.     Motor: No abnormal muscle tone.     Coordination: Coordination normal.     Deep Tendon Reflexes: Reflexes normal.  Psychiatric:        Behavior: Behavior normal.        Thought Content: Thought content normal.        Judgment: Judgment normal.     Lab Results  Component Value Date   WBC 7.5 01/06/2019   HGB 14.0 01/06/2019   HCT 43.3 01/06/2019   PLT 207.0 01/06/2019   GLUCOSE 99 01/06/2019   CHOL 130 01/06/2019   TRIG 100.0 01/06/2019   HDL 35.10 (L) 01/06/2019   LDLDIRECT 76.7 11/17/2008   LDLCALC 75 01/06/2019   ALT 11 01/06/2019   AST 17 01/06/2019   NA 141 01/06/2019   K 4.4 01/06/2019   CL 106 01/06/2019   CREATININE 0.84 01/06/2019   BUN 15 01/06/2019   CO2 29 01/06/2019   TSH 6.52 (H) 01/06/2019    DG Bone Density  Result Date: 02/28/2019 Date of study: 02/23/19 Exam: DUAL X-RAY ABSORPTIOMETRY (DXA) FOR BONE MINERAL DENSITY (BMD) Instrument: [employer] Provider: PCP Indication: follow up for low BMD Comparison: none (please note that it is not possible to compare data from different instruments) Clinical data: Pt is a 86 y.o. female without  previous history of fracture. On calcium and vitamin D.  She is a smoker.  Results:  Lumbar spine L1-L4 Femoral neck (FN) 33% distal radius T-score 3.1 RFN: -2.0 LFN: -1.9 n/a Change in BMD from previous DXA test (%) n/a n/a n/a (*) statistically significant Assessment: the BMD is low according to the [facility] classification for osteoporosis (see below). Fracture  risk: moderate FRAX score: 10 year major osteoporotic risk: 16.3%. 10 year hip fracture risk: 7.5%. The thresholds for treatment are 20% and 3%, respectively. Comments: the technical quality of the study is good.  L3 was excluded due to degenerative change. Evaluation for secondary causes should be considered if clinically indicated. Recommend optimizing calcium (1200 mg/day) and vitamin D (800 IU/day) intake. Followup: Repeat BMD is appropriate after 2 years or after 1-2 years if starting treatment. WHO criteria for diagnosis of osteoporosis in postmenopausal women and in men 65 y/o or older: - normal: T-score -1.0 to + 1.0 - osteopenia/low bone density: T-score between -2.5 and -1.0 - osteoporosis: T-score below -2.5 - severe osteoporosis: T-score below -2.5 with history of fragility fracture Note: although not part of the WHO classification, the presence of a fragility fracture, regardless of the T-score, should be considered diagnostic of osteoporosis, provided other causes for the fracture have been excluded. Treatment: The National Osteoporosis Foundation recommends that treatment be considered in postmenopausal women and men age 49 or older with: 1. Hip or vertebral (clinical or morphometric) fracture 2. T-score of - 2.5 or lower at the spine or hip 3. 10-year fracture probability by FRAX of at least 20% for a major osteoporotic fracture and 3% for a hip fracture Loura Pardon MD    Assessment & Plan:    Walker Kehr, MD

## 2019-07-12 NOTE — Assessment & Plan Note (Signed)
Amlodipine, Losartan, Toprol BP ok at home

## 2019-07-12 NOTE — Assessment & Plan Note (Signed)
Lipitor 

## 2019-07-13 ENCOUNTER — Other Ambulatory Visit: Payer: Self-pay | Admitting: Internal Medicine

## 2019-07-13 MED ORDER — VITAMIN D3 50 MCG (2000 UT) PO CAPS: 2000.0000 [IU] | ORAL_CAPSULE | Freq: Every day | ORAL | 3 refills | Status: DC

## 2019-07-13 MED ORDER — VITAMIN D3 1.25 MG (50000 UT) PO CAPS: 1.0000 | ORAL_CAPSULE | ORAL | 0 refills | Status: DC

## 2019-08-02 ENCOUNTER — Other Ambulatory Visit: Payer: Self-pay

## 2019-08-02 ENCOUNTER — Ambulatory Visit: Payer: Medicare Other | Admitting: Cardiology

## 2019-08-02 ENCOUNTER — Encounter: Payer: Self-pay | Admitting: Cardiology

## 2019-08-02 VITALS — BP 130/78 | HR 65 | Temp 97.0°F | Ht 63.0 in | Wt 128.8 lb

## 2019-08-02 DIAGNOSIS — I252 Old myocardial infarction: Secondary | ICD-10-CM

## 2019-08-02 DIAGNOSIS — I10 Essential (primary) hypertension: Secondary | ICD-10-CM

## 2019-08-02 DIAGNOSIS — I2510 Atherosclerotic heart disease of native coronary artery without angina pectoris: Secondary | ICD-10-CM

## 2019-08-02 DIAGNOSIS — Z9861 Coronary angioplasty status: Secondary | ICD-10-CM | POA: Diagnosis not present

## 2019-08-02 DIAGNOSIS — I350 Nonrheumatic aortic (valve) stenosis: Secondary | ICD-10-CM | POA: Diagnosis not present

## 2019-08-02 NOTE — Patient Instructions (Addendum)
Medication Instructions:  No Changes *If you need a refill on your cardiac medications before your next appointment, please call your pharmacy*  Lab Work: None  Testing/Procedures: Echo in August  Follow-Up: At [facility], you and your health needs are our priority.  As part of our continuing mission to provide you with exceptional heart care, we have created designated Provider Care Teams.  These Care Teams include your primary Cardiologist (physician) and Advanced Practice Providers (APPs -  Physician Assistants and Nurse Practitioners) who all work together to provide you with the care you need, when you need it.  We recommend signing up for the patient portal called "MyChart".  Sign up information is provided on this After Visit Summary.  MyChart is used to connect with patients for Virtual Visits (Telemedicine).  Patients are able to view lab/test results, encounter notes, upcoming appointments, etc.  Non-urgent messages can be sent to your provider as well.   To learn more about what you can do with MyChart, go to [email or web address].    Your next appointment:   6 month(s) after Echocardiogram  The format for your next appointment:   In Person  Provider:   Quay Burow, MD

## 2019-08-02 NOTE — Progress Notes (Signed)
Cardiology Office Note:    Date:  08/02/2019   ID:  Tammy Brooks, DOB 1933-10-05, MRN [id number]  PCP:  Cassandria Anger, MD  Cardiologist:  Quay Burow, MD  Electrophysiologist:  None   Referring MD: Cassandria Anger, MD   No chief complaint on file.   History of Present Illness:    Tammy Brooks is a 86 y.o. female with a hx of remote MI-cath in '99 showed only mild CAD. The ptwas admitted8/25/19 to [facility] with chest pain and was diagnosed with a posterior STEMI. She was transferred to [facility] and underwent cath and PCI by Dr Wyline Copas. She had a mCFX and an OM1 stent placed. She had moderate RCA and Dx disease and initially an EF of 40-45% but her EF wasnormal by post PCI echo.  In addition to CAD she has severeAS with normal LVF by echo July 2020.  She has PVD with mild carotid disease and a 4.5 x 4.9 sm AAA by [place] Aug 2020. Dr Donnetta Hutching has noted an increase in her AAA and is following her Q 6 months now. Apparently she is not a candidate for a stent graft.  She has COPD and smokes1/2 pack a day.   She is in the office today for a follow up.  She has done well since she saw [place] last. She denies any history of chest pain, increased DOE, near syncope, or syncope.   Past Medical History:  Diagnosis Date  . Abdominal aortic aneurysm ([place])    followed by Dr. Sherren Mocha Early  . Adenomatous colon polyp 1998  . Arthritis   . Blind left eye   . CAD (coronary artery disease) 08/1995   50-55% second diag dz.; occluded OM  . Complication of anesthesia    "Blood pressure went down" with retinal surgery - no problems with previous surgeries  . Echocardiogram abnormal 03/2012   mild to mod AS, valve area1.58 cm2, mild concentric LVH, grade I diastolic dusfunction  . Gout   . Heart murmur   . Hyperlipidemia   . Hypertension   . MI (myocardial infarction) ([place]) 01/25/2018  . Myocardial infarction [facility])    mild/ age 63  . PAD (peripheral artery disease) ([facility])     mild to mod Rt ICA stenosis followed by doppler-02/05/12  . Pelvic mass   . Stroke [facility]) 2001   no residual problems  . Tobacco abuse   . Ulcer     Past Surgical History:  Procedure Laterality Date  . ABDOMINAL HYSTERECTOMY    . APPENDECTOMY    . CARDIAC CATHETERIZATION  08/1995   50-55% second diag lesion, occluded OM  . CATARACT EXTRACTION W/ INTRAOCULAR LENS  IMPLANT, BILATERAL    . COLONOSCOPY    . EYE SURGERY     lense implant removed/ blind in left eye  . LAPAROTOMY N/A 04/26/2014   Procedure: EXPLORATORY LAPAROTOMY/RESECTION OF PELVIC MASS;  Surgeon: Alvino Chapel, MD;  Location: [facility] ORS;  Service: Gynecology;  Laterality: N/A;  . OOPHORECTOMY     and fallopian tube  . POLYPECTOMY    . RETINAL DETACHMENT SURGERY  2006  . TONSILLECTOMY AND ADENOIDECTOMY    . TUBAL LIGATION    . UPPER GASTROINTESTINAL ENDOSCOPY     treated for h-pylori    Current Medications: Current Meds  Medication Sig  . allopurinol (ZYLOPRIM) 100 MG tablet Take 1 tablet (100 mg total) by mouth daily.  Marland Kitchen amLODipine (NORVASC) 5 MG tablet TAKE 1 TABLET  BY MOUTH  DAILY  . atorvastatin (LIPITOR) 40 MG tablet TAKE 2 TABLETS BY MOUTH  DAILY  . Cholecalciferol (VITAMIN D3) 1.25 MG (50000 UT) CAPS Take 1 capsule by mouth once a week.  . Cholecalciferol (VITAMIN D3) 50 MCG (2000 UT) capsule Take 1 capsule (2,000 Units total) by mouth daily.  . clopidogrel (PLAVIX) 75 MG tablet Take 1 tablet (75 mg total) by mouth daily.  . colchicine 0.6 MG tablet Take 1 tablet (0.6 mg total) by mouth 4 (four) times daily as needed. (Patient taking differently: Take 0.6 mg by mouth 4 (four) times daily as needed (for gout flares). )  . furosemide (LASIX) 20 MG tablet TAKE 1-2 TABLETS BY MOUTH  DAILY AS NEEDED FOR EDEMA. (Patient taking differently: Take 20-40 mg by mouth daily as needed for edema. )  . isosorbide mononitrate (IMDUR) 30 MG 24 hr tablet TAKE 1 TABLET BY MOUTH  DAILY  . levothyroxine (SYNTHROID) 50 MCG  tablet TAKE 1 TABLET BY MOUTH  DAILY  . losartan (COZAAR) 25 MG tablet TAKE 1 TABLET BY MOUTH  DAILY  . metoprolol tartrate (LOPRESSOR) 25 MG tablet TAKE ONE-HALF TABLET BY  MOUTH TWO TIMES DAILY  . Multiple Vitamins-Minerals (PRESERVISION/LUTEIN) CAPS Take 1 capsule by mouth daily with breakfast.  . naproxen sodium (ALEVE) 220 MG tablet Take 440 mg by mouth 2 (two) times daily as needed (for back pain).   . nitroGLYCERIN (NITROSTAT) 0.4 MG SL tablet Place 0.4 mg under the tongue every 5 (five) minutes as needed for chest pain.  Marland Kitchen tiZANidine (ZANAFLEX) 4 MG tablet Take 1 tablet (4 mg total) by mouth every 8 (eight) hours as needed for muscle spasms.  . traMADol (ULTRAM) 50 MG tablet Take 1 tablet (50 mg total) by mouth every 8 (eight) hours as needed. (Patient taking differently: Take 50 mg by mouth every 8 (eight) hours as needed (FOR PAIN). )     Allergies:   Nabumetone and Tape   Social History   Socioeconomic History  . Marital status: Widowed    Spouse name: Not on file  . Number of children: 5  . Years of education: 9  . Highest education level: 9th grade  Occupational History  . Occupation: retired    Comment: retired  Tobacco Use  . Smoking status: Current Every Day Smoker    Packs/day: 0.50    Years: 62.00    Pack years: 31.00    Types: Cigarettes  . Smokeless tobacco: Never Used  Substance and Sexual Activity  . Alcohol use: Yes    Alcohol/week: 0.0 standard drinks    Comment: occasionally - few drinks/month  . Drug use: No  . Sexual activity: Never    Partners: Male    Birth control/protection: None, Surgical  Other Topics Concern  . Not on file  Social History Narrative   9th grade. married '54- widowed Dec 10th'08. 4 sons - '55, '58, '60, '69; 1 daughter - '63; 12 grandchildren; 9 great-grandchildren. 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 Other children are close and attentive. ACP/End-of-life: No CPR, no mechanical  ventilation, no futile/heroic measures.   Social Determinants of Health   Financial Resource Strain: Low Risk   . Difficulty of Paying Living Expenses: Not hard at all  Food Insecurity: No Food Insecurity  . Worried About Charity fundraiser in the Last Year: Never true  . Ran Out of Food in the Last Year: Never true  Transportation Needs: No Transportation Needs  . Lack of Transportation (Medical): No  . Lack of Transportation (Non-Medical): No  Physical Activity: Inactive  . Days of Exercise per Week: 0 days  . Minutes of Exercise per Session: 0 min  Stress: No Stress Concern Present  . Feeling of Stress : Not at all  Social Connections: Unknown  . Frequency of Communication with Friends and Family: More than three times a week  . Frequency of Social Gatherings with Friends and Family: More than three times a week  . Attends Religious Services: Not on file  . Active Member of Clubs or Organizations: Not on file  . Attends Archivist Meetings: Not on file  . Marital Status: Widowed     Family History: The patient's family history includes Aneurysm in her mother; Cancer in her maternal aunt; Diabetes in her sister; Heart attack in her father; Heart disease in her father; Hyperlipidemia in her mother and sister; Hypertension in her mother and sister. There is no history of Colon cancer or Stomach cancer.  ROS:   Please see the history of present illness.     All other systems reviewed and are negative.  EKGs/Labs/Other Studies Reviewed:    The following studies were reviewed today: Echo Aug 2020  EKG:  EKG is ordered today.  The ekg ordered today demonstrates NSR, HR 65, LAD, prior anterior MI.  TWI in V5 and V6 seen on previous studies was not seen on today's EKG.  Recent Labs: 01/06/2019: Hemoglobin 14.0; Platelets 207.0 07/12/2019: ALT 9; BUN 15; Creatinine, Ser 1.04; Potassium 4.0; Sodium 140; TSH 7.68  Recent Lipid Panel    Component Value Date/Time   CHOL 122  07/12/2019 1023   CHOL 130 03/10/2013 0849   TRIG 94.0 07/12/2019 1023   HDL 32.20 (L) 07/12/2019 1023   HDL 30 (L) 03/10/2013 0849   CHOLHDL 4 07/12/2019 1023   VLDL 18.8 07/12/2019 1023   LDLCALC 71 07/12/2019 1023   LDLCALC 66 03/10/2013 0849   LDLDIRECT 76.7 11/17/2008 1533    Physical Exam:    VS:  BP 130/78   Pulse 65   Temp (!) 97 F (36.1 C)   Ht 5\' 3"  (1.6 m)   Wt 128 lb 12.8 oz (58.4 kg)   SpO2 98%   BMI 22.82 kg/m     Wt Readings from Last 3 Encounters:  08/02/19 128 lb 12.8 oz (58.4 kg)  07/12/19 130 lb (59 kg)  01/29/19 133 lb (60.3 kg)     GEN:  Elderly Caucasian female, well developed in no acute distress HEENT: Normal NECK: No JVD; transmitted murmur CARDIAC: RRR, 2/0/ systolic murmur AOV and LSB, preserved s2, no rubs, gallops RESPIRATORY:  Clear to auscultation without rales, wheezing or rhonchi  ABDOMEN: Soft, non-tender, non-distended MUSCULOSKELETAL:  No edema; No deformity  SKIN: Warm and dry NEUROLOGIC:  Alert and oriented x 3 PSYCHIATRIC:  Normal affect   ASSESSMENT:    Severeaortic stenosis Severe AS in July [facility]), 2020. She is asytmptomatic  Essential hypertension Better control with the addition of Amlodipine 5 mg and Losartan 25 mg-  H/OSTEMI (ST elevation myocardial infarction) ([place]) Pt had STEMI 01/25/18-went to [facility], then transferred to [facility] and had PCI. No recent angina  CAD S/P percutaneous coronary angioplasty CFX and OM1 PCI with DES 01/25/18- [place], Dr Wyline Copas  AAA (abdominal aortic aneurysm) without rupture Followed by Dr Donnetta Hutching, apparently not a stent graft candidate. CT scan Q 6 months  COPD  by history 1/2 PPD smoker  HLD- LDL 71 on Lipitor 80 mg  PLAN:    No change in Rx-f/u with [name] after her echo in August.    Medication Adjustments/Labs and Tests Ordered: Current medicines are reviewed at length with the patient today.  Concerns regarding medicines are outlined above.   Orders Placed This Encounter  Procedures  . EKG 12-Lead   No orders of the defined types were placed in this encounter.   Patient Instructions  Medication Instructions:  No Changes *If you need a refill on your cardiac medications before your next appointment, please call your pharmacy*  Lab Work: None  Testing/Procedures: Echo in August  Follow-Up: At [facility], you and your health needs are our priority.  As part of our continuing mission to provide you with exceptional heart care, we have created designated Provider Care Teams.  These Care Teams include your primary Cardiologist (physician) and Advanced Practice Providers (APPs -  Physician Assistants and Nurse Practitioners) who all work together to provide you with the care you need, when you need it.  We recommend signing up for the patient portal called "MyChart".  Sign up information is provided on this After Visit Summary.  MyChart is used to connect with patients for Virtual Visits (Telemedicine).  Patients are able to view lab/test results, encounter notes, upcoming appointments, etc.  Non-urgent messages can be sent to your provider as well.   To learn more about what you can do with MyChart, go to [email or web address].    Your next appointment:   6 month(s) after Echocardiogram  The format for your next appointment:   In Person  Provider:   Quay Burow, MD     Signed, Kerin Ransom, PA-C  08/02/2019 1:57 PM    [place]

## 2019-08-09 NOTE — Progress Notes (Addendum)
HISTORY AND PHYSICAL     CC:  follow up. Requesting Provider:  Plotnikov, Evie Lacks, MD  HPI: This is a 86 y.o. female pt of Dr. Donnetta Hutching who is here today for follow up.  She has a hx of AAA that measured 5.12cm in August 2020.  She was last seen 01/13/2019 and at that time, she was doing well.  Her CT scan revealed that she is not a stent graft candidate.  Dr. Donnetta Hutching had discussed with the pt that her risk of rupture was less than her risk for open AAA repair at her age and coronary status.    She has a hx of coronary stenting in August 2019 when she had a STEMI.  She also has severe aortic stenosis by echo in July 2020.  She was recently seen by cardiology and is asymptomatic from this.  There is plans for f/u echo in August. HTN better control with Amlodipine and Losartan.  It was recommended she f/u in 6 months for AAA.  She has family hx as her mother and maternal aunt had a AAA.    Her carotid artery stenosis is followed by Dr. Gwenlyn Found and in 2018 she had 1-39% bilateral carotid artery stenosis.    She has a known lower abdominal hernia after gynecological surgery.  She was referred to general surgery and told they could repair it when she quits smoking.   The pt returns today for follow up for AAA.  She has not had anything new medically since her last visit. She has been feeling well. She continues her activities of daily living. She is not having any symptoms related to her aneurysm. She denies any lower extremity claudication, weakness or numbness. She does have some abdominal pain related to incisional hernias but denies any new or constant abdominal pain. She denies any changes in bowel habits. She denies any back pain.  The pt is on a statin for cholesterol management.    The pt is not on an aspirin.    Other AC:  Plavix The pt is on CCB, ARB, BB for hypertension.  The pt does not have diabetes. Tobacco hx:  Current:  ~ 1/2 ppd   Past Medical History:  Diagnosis Date  . Abdominal  aortic aneurysm ([place])    followed by Dr. Sherren Mocha Early  . Adenomatous colon polyp 1998  . Arthritis   . Blind left eye   . CAD (coronary artery disease) 08/1995   50-55% second diag dz.; occluded OM  . Complication of anesthesia    "Blood pressure went down" with retinal surgery - no problems with previous surgeries  . Echocardiogram abnormal 03/2012   mild to mod AS, valve area1.58 cm2, mild concentric LVH, grade I diastolic dusfunction  . Gout   . Heart murmur   . Hyperlipidemia   . Hypertension   . MI (myocardial infarction) ([place]) 01/25/2018  . Myocardial infarction [facility])    mild/ age 49  . PAD (peripheral artery disease) ([facility])    mild to mod Rt ICA stenosis followed by doppler-02/05/12  . Pelvic mass   . Stroke [facility]) 2001   no residual problems  . Tobacco abuse   . Ulcer     Past Surgical History:  Procedure Laterality Date  . ABDOMINAL HYSTERECTOMY    . APPENDECTOMY    . CARDIAC CATHETERIZATION  08/1995   50-55% second diag lesion, occluded OM  . CATARACT EXTRACTION W/ INTRAOCULAR LENS  IMPLANT, BILATERAL    . COLONOSCOPY    .  EYE SURGERY     lense implant removed/ blind in left eye  . LAPAROTOMY N/A 04/26/2014   Procedure: EXPLORATORY LAPAROTOMY/RESECTION OF PELVIC MASS;  Surgeon: Alvino Chapel, MD;  Location: [facility];  Service: Gynecology;  Laterality: N/A;  . OOPHORECTOMY     and fallopian tube  . POLYPECTOMY    . RETINAL DETACHMENT SURGERY  2006  . TONSILLECTOMY AND ADENOIDECTOMY    . TUBAL LIGATION    . UPPER GASTROINTESTINAL ENDOSCOPY     treated for h-pylori    Allergies  Allergen Reactions  . Nabumetone Anaphylaxis, Swelling and Other (See Comments)    Causes inflammation of airways  . Tape Other (See Comments)    SKIN IS VERY THIN AND TEARS EASILY- PLEASE USE AN ALTERNATIVE TO TAPE, IF POSSIBLE!!    Current Outpatient Medications  Medication Sig Dispense Refill  . allopurinol (ZYLOPRIM) 100 MG tablet Take 1 tablet (100 mg total) by mouth  daily. 90 tablet 0  . amLODipine (NORVASC) 5 MG tablet TAKE 1 TABLET BY MOUTH  DAILY 90 tablet 1  . atorvastatin (LIPITOR) 40 MG tablet TAKE 2 TABLETS BY MOUTH  DAILY 180 tablet 3  . Cholecalciferol (VITAMIN D3) 1.25 MG (50000 UT) CAPS Take 1 capsule by mouth once a week. 9 capsule 0  . Cholecalciferol (VITAMIN D3) 50 MCG (2000 UT) capsule Take 1 capsule (2,000 Units total) by mouth daily. 100 capsule 3  . clopidogrel (PLAVIX) 75 MG tablet Take 1 tablet (75 mg total) by mouth daily. 90 tablet 3  . colchicine 0.6 MG tablet Take 1 tablet (0.6 mg total) by mouth 4 (four) times daily as needed. (Patient taking differently: Take 0.6 mg by mouth 4 (four) times daily as needed (for gout flares). ) 360 tablet 2  . furosemide (LASIX) 20 MG tablet TAKE 1-2 TABLETS BY MOUTH  DAILY AS NEEDED FOR EDEMA. (Patient taking differently: Take 20-40 mg by mouth daily as needed for edema. ) 180 tablet 1  . isosorbide mononitrate (IMDUR) 30 MG 24 hr tablet TAKE 1 TABLET BY MOUTH  DAILY 90 tablet 3  . levothyroxine (SYNTHROID) 50 MCG tablet TAKE 1 TABLET BY MOUTH  DAILY 90 tablet 3  . losartan (COZAAR) 25 MG tablet TAKE 1 TABLET BY MOUTH  DAILY 90 tablet 1  . metoprolol tartrate (LOPRESSOR) 25 MG tablet TAKE ONE-HALF TABLET BY  MOUTH TWO TIMES DAILY 90 tablet 3  . Multiple Vitamins-Minerals (PRESERVISION/LUTEIN) CAPS Take 1 capsule by mouth daily with breakfast.    . naproxen sodium (ALEVE) 220 MG tablet Take 440 mg by mouth 2 (two) times daily as needed (for back pain).     . nitroGLYCERIN (NITROSTAT) 0.4 MG SL tablet Place 0.4 mg under the tongue every 5 (five) minutes as needed for chest pain.    Marland Kitchen tiZANidine (ZANAFLEX) 4 MG tablet Take 1 tablet (4 mg total) by mouth every 8 (eight) hours as needed for muscle spasms. 90 tablet 2  . traMADol (ULTRAM) 50 MG tablet Take 1 tablet (50 mg total) by mouth every 8 (eight) hours as needed. (Patient taking differently: Take 50 mg by mouth every 8 (eight) hours as needed (FOR  PAIN). ) 100 tablet 2   No current facility-administered medications for this visit.    Family History  Problem Relation Age of Onset  . Aneurysm Mother        AAA  . Hypertension Mother   . Hyperlipidemia Mother   . Heart disease Father  CAD, MI x 3  . Heart attack Father   . Cancer Maternal Aunt        breast cancer  . Diabetes Sister   . Hyperlipidemia Sister   . Hypertension Sister   . Colon cancer Neg Hx   . Stomach cancer Neg Hx     Social History   Socioeconomic History  . Marital status: Widowed    Spouse name: Not on file  . Number of children: 5  . Years of education: 9  . Highest education level: 9th grade  Occupational History  . Occupation: retired    Comment: retired  Tobacco Use  . Smoking status: Current Every Day Smoker    Packs/day: 0.50    Years: 62.00    Pack years: 31.00    Types: Cigarettes  . Smokeless tobacco: Never Used  Substance and Sexual Activity  . Alcohol use: Yes    Alcohol/week: 0.0 standard drinks    Comment: occasionally - few drinks/month  . Drug use: No  . Sexual activity: Never    Partners: Male    Birth control/protection: None, Surgical  Other Topics Concern  . Not on file  Social History Narrative   9th grade. married '54- widowed Dec 10th'08. 4 sons - '55, '58, '60, '69; 1 daughter - '63; 12 grandchildren; 9 great-grandchildren. 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 Other children are close and attentive. ACP/End-of-life: No CPR, no mechanical ventilation, no futile/heroic measures.   Social Determinants of Health   Financial Resource Strain: Low Risk   . Difficulty of Paying Living Expenses: Not hard at all  Food Insecurity: No Food Insecurity  . Worried About Charity fundraiser in the Last Year: Never true  . Ran Out of Food in the Last Year: Never true  Transportation Needs: No Transportation Needs  . Lack of Transportation (Medical): No  . Lack of  Transportation (Non-Medical): No  Physical Activity: Inactive  . Days of Exercise per Week: 0 days  . Minutes of Exercise per Session: 0 min  Stress: No Stress Concern Present  . Feeling of Stress : Not at all  Social Connections: Unknown  . Frequency of Communication with Friends and Family: More than three times a week  . Frequency of Social Gatherings with Friends and Family: More than three times a week  . Attends Religious Services: Not on file  . Active Member of Clubs or Organizations: Not on file  . Attends Archivist Meetings: Not on file  . Marital Status: Widowed  Intimate Partner Violence:   . Fear of Current or Ex-Partner: Not on file  . Emotionally Abused: Not on file  . Physically Abused: Not on file  . Sexually Abused: Not on file     REVIEW OF SYSTEMS:   [X]  denotes positive finding, [ ]  denotes negative finding Cardiac  Comments:  Chest pain or chest pressure:    Shortness of breath upon exertion:    Short of breath when lying flat:    Irregular heart rhythm:        Vascular    Pain in calf, thigh, or hip brought on by ambulation:    Pain in feet at night that wakes you up from your sleep:     Blood clot in your veins:    Leg swelling:         Pulmonary    Oxygen at home:    Productive cough:  Wheezing:         Neurologic    Sudden weakness in arms or legs:     Sudden numbness in arms or legs:     Sudden onset of difficulty speaking or slurred speech:    Temporary loss of vision in one eye:     Problems with dizziness:         Gastrointestinal    Blood in stool:     Vomited blood:         Genitourinary    Burning when urinating:     Blood in urine:        Psychiatric    Major depression:         Hematologic    Bleeding problems:    Problems with blood clotting too easily:        Skin    Rashes or ulcers:        Constitutional    Fever or chills:     Vitals:   08/11/19 0945  BP: (!) 143/71  Pulse: (!) 58  Resp: 16    Temp: 97.7 F (36.5 C)  SpO2: 94%    PHYSICAL EXAMINATION: General:  WDWN in NAD; vital signs documented above Gait: Normal HENT: WNL, normocephalic Pulmonary: clear to auscultation, non-labored breathing , without wheezing Cardiac: regular HR, without  Murmurs; L carotid bruit Abdomen: soft, NT, no masses. Reducible midline infraumbilical incisional hernia, non tender. Super pubic lower abdominal large hernia, reducible, no tender. Not able to palpate AAA Skin: without rashes Vascular Exam/Pulses:  Right Left  Radial 2+ (normal) 2+ (normal)  Ulnar 2+ (normal) 2+ (normal)  Femoral 2+ (normal) 2+ (normal)  Popliteal Not palpable Not palpable  DP 2+ (normal) 2+ (normal)  PT 1+ (weak) 1+ (weak)   Extremities: without ischemic changes, without Gangrene , without cellulitis; without open wounds;  Musculoskeletal: no muscle wasting or atrophy  Neurologic: A&O X 3;  No focal weakness or paresthesias are detected Psychiatric:  The pt has Normal affect.   Non-Invasive Vascular Imaging:    AAA duplex on 08/11/2019:  Abdominal Aorta Findings:  +-----------+-------+----------+----------+----------+--------+--------+  Location  AP (cm)Trans (cm)PSV (cm/s)Waveform ThrombusComments  +-----------+-------+----------+----------+----------+--------+--------+  Proximal  2.57  2.73   65                   +-----------+-------+----------+----------+----------+--------+--------+  Mid    5.00  5.12   30                   +-----------+-------+----------+----------+----------+--------+--------+  Distal   4.85  5.01   26                   +-----------+-------+----------+----------+----------+--------+--------+  RT CIA Prox2.2  2.5    148    monophasic          +-----------+-------+----------+----------+----------+--------+--------+  LT CIA Prox1.5  1.4    78     biphasic           +-----------+-------+----------+----------+----------+--------+--------+  Summary:  Abdominal Aorta: There is evidence of abnormal dilatation of the mid and  distal Abdominal aorta. There is evidence of abnormal dilation of the  Right Common Iliac artery. The largest aortic diameter remains essentially  unchanged compared to prior exam. Previous diameter measurement was 5.1 cm obtained on 01/05/2019.   Previous AAA duplex on 01/05/2019 Abdominal Aorta Findings:  +-----------+-------+----------+----------+----------+--------+--------+  Location  AP (cm)Trans (cm)PSV (cm/s)Waveform ThrombusComments  +-----------+-------+----------+----------+----------+--------+--------+  Proximal  2.43  2.40   56    monophasic          +-----------+-------+----------+----------+----------+--------+--------+  Mid to dst 5.12  5.00   33    biphasic Present       +-----------+-------+----------+----------+----------+--------+--------+  Distal   4.40  4.69   17    biphasic           +-----------+-------+----------+----------+----------+--------+--------+  RT CIA Prox1.2  1.0    111    triphasic           +-----------+-------+----------+----------+----------+--------+--------+  LT CIA Prox1.4  1.4    68    triphasic           +-----------+-------+----------+----------+----------+--------+--------+    ASSESSMENT/PLAN:: 86 y.o. female here for follow up for known AAA. The AAA remains essentially unchanged compared to prior exam. There is a slight increase in the distal Aorta as well as the right common iliac artery compared to last duplex. However the iliacs previously were not well visualized. She remains asymptomatic.   - She continues to smoke daily - she will continue her Plavix and Statin - discussed importance of adequate blood pressure control.  She checks her BP regularly at home and states her SBP usually runs in the 120's-140's - reviewed with her symptoms of leaking aneurysm/ rupture that should warrant presenting immediately to the emergency room - she will follow up again in 6 months with ultrasound    Paulo Fruit, PA-C [facility] [phone]  Clinic MD:   Scot Dock

## 2019-08-10 ENCOUNTER — Telehealth (HOSPITAL_COMMUNITY): Payer: Self-pay

## 2019-08-10 ENCOUNTER — Other Ambulatory Visit: Payer: Self-pay | Admitting: *Deleted

## 2019-08-10 DIAGNOSIS — I714 Abdominal aortic aneurysm, without rupture, unspecified: Secondary | ICD-10-CM

## 2019-08-10 NOTE — Telephone Encounter (Signed)
The above patient or their representative was contacted and gave the following answers to these questions:         Do you have any of the following symptoms?    NO  Fever                    Cough                   Shortness of breath  Do  you have any of the following other symptoms?    muscle pain         vomiting,        diarrhea        rash         weakness        red eye        abdominal pain         bruising          bruising or bleeding              joint pain           severe headache    Have you been in contact with someone who was or has been sick in the past 2 weeks?  NO  Yes                 Unsure                         Unable to assess   Does the person that you were in contact with have any of the following symptoms?   Cough         shortness of breath           muscle pain         vomiting,            diarrhea            rash            weakness           fever            red eye           abdominal pain           bruising  or  bleeding                joint pain                severe headache                 COMMENTS OR ACTION PLAN FOR THIS PATIENT:        ALL QUESTIONS WERE ANSWERED/[facility] PT HAS HAD HER 1ST COVID SHOT

## 2019-08-11 ENCOUNTER — Encounter: Payer: Self-pay | Admitting: Physician Assistant

## 2019-08-11 ENCOUNTER — Other Ambulatory Visit: Payer: Self-pay

## 2019-08-11 ENCOUNTER — Ambulatory Visit (HOSPITAL_COMMUNITY)
Admission: RE | Admit: 2019-08-11 | Discharge: 2019-08-11 | Disposition: A | Discharge status: Home / Self Care | Payer: Medicare Other | Source: Ambulatory Visit | Attending: Vascular Surgery | Admitting: Vascular Surgery

## 2019-08-11 ENCOUNTER — Ambulatory Visit: Payer: Medicare Other | Admitting: Physician Assistant

## 2019-08-11 VITALS — BP 143/71 | HR 58 | Temp 97.7°F | Resp 16 | Ht 63.0 in | Wt 129.0 lb

## 2019-08-11 DIAGNOSIS — I714 Abdominal aortic aneurysm, without rupture, unspecified: Secondary | ICD-10-CM

## 2019-08-12 ENCOUNTER — Other Ambulatory Visit: Payer: Self-pay | Admitting: *Deleted

## 2019-08-12 DIAGNOSIS — I714 Abdominal aortic aneurysm, without rupture, unspecified: Secondary | ICD-10-CM

## 2019-09-12 IMAGING — CT CT ANGIO CHEST
2 of 7 series · 19 of 46 positions shown · IV contrast (APPLIED)
Comparison: Chest two-view 10/03/2015. CT abdomen pelvis
12/16/2016, CT angio chest 03/14/2012

CLINICAL DATA: Chest pain, rule out pulmonary embolism

EXAM:
CT ANGIOGRAPHY CHEST WITH CONTRAST
TECHNIQUE: Multidetector CT imaging of the chest was performed using the
standard protocol during bolus administration of intravenous
contrast. Multiplanar CT image reconstructions and MIPs were
obtained to evaluate the vascular anatomy.
CONTRAST:  50 mL [id number]-[id number] IOPAMIDOL ([id number]) INJECTION 76%

[Series 7: thins · axial · 0.63mm/px · z∈[+1084,+1359]mm · 16 of 443 slices shown]
[im 25/443  lung]
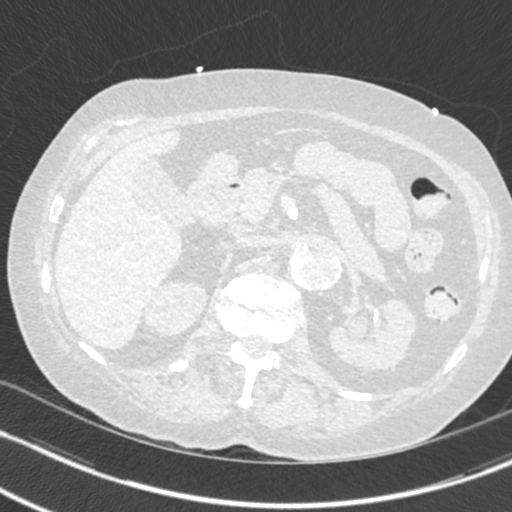
[im 50/443  soft-tissue]
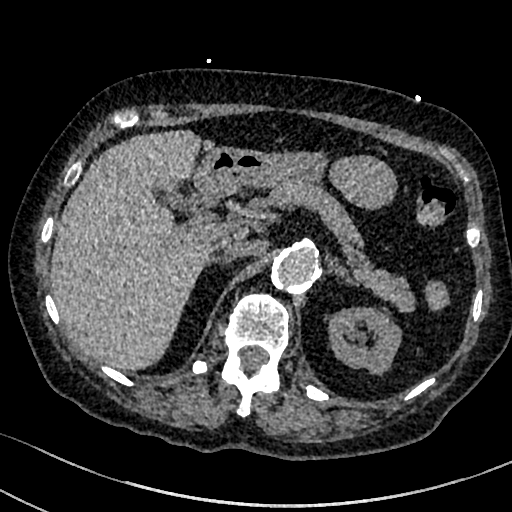
[im 74/443  lung]
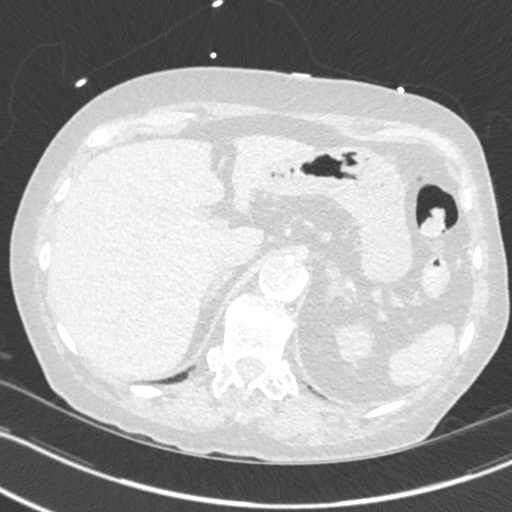
[im 99/443  soft-tissue]
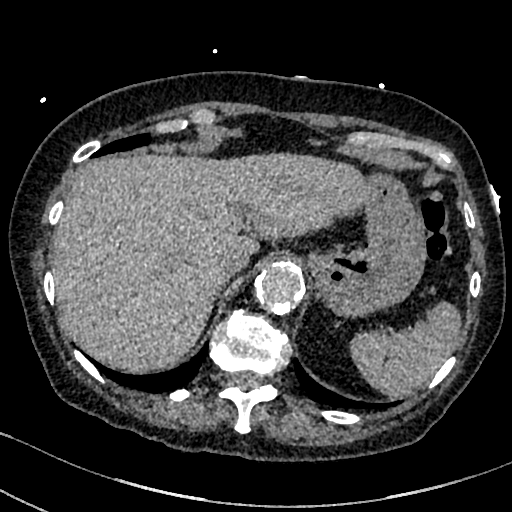
[im 123/443  lung]
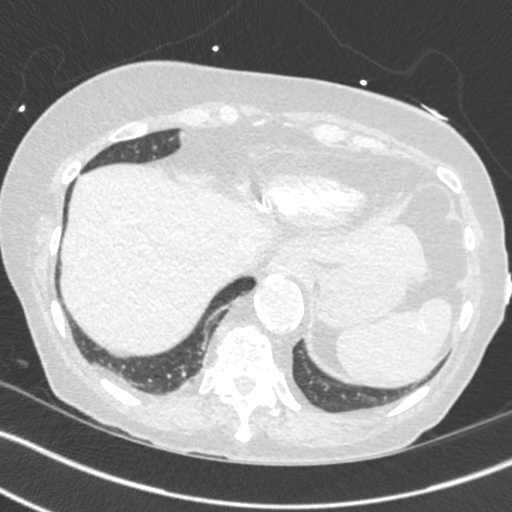
[im 148/443  soft-tissue]
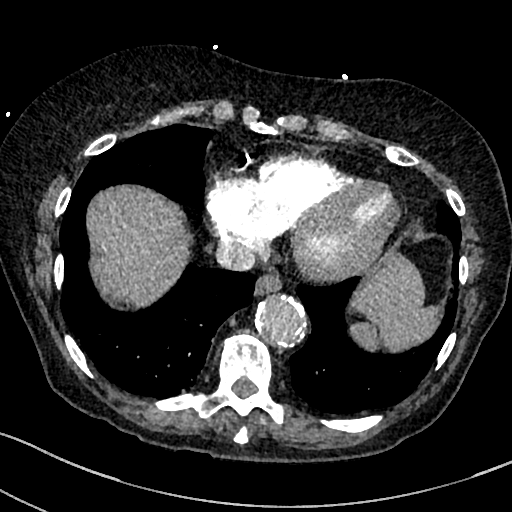
[im 172/443  lung]
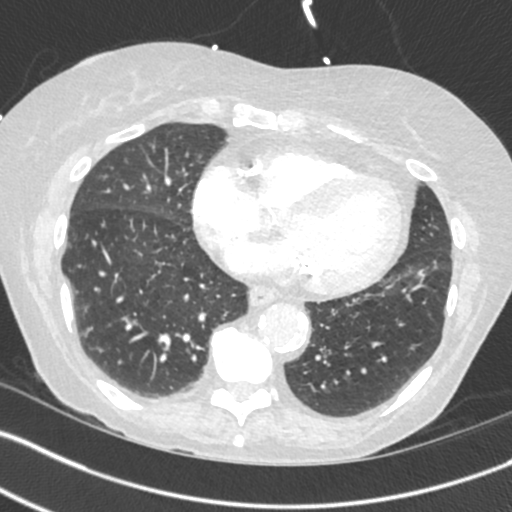
[im 197/443  soft-tissue]
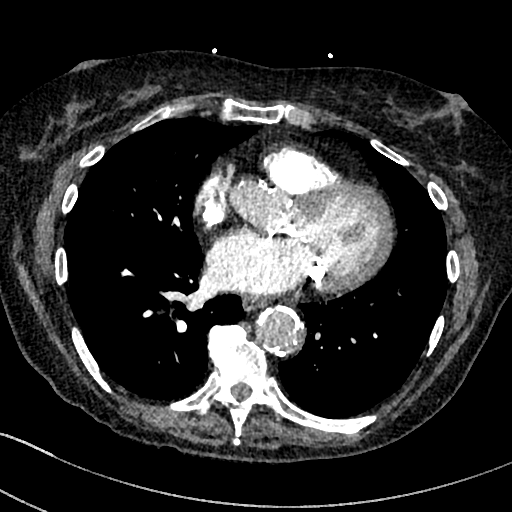
[im 246/443  lung]
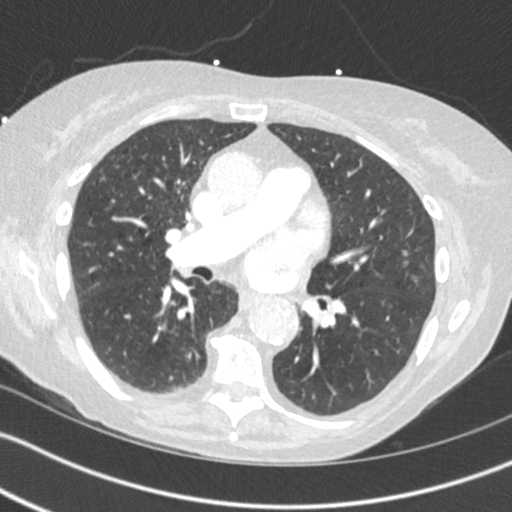
[im 271/443  soft-tissue]
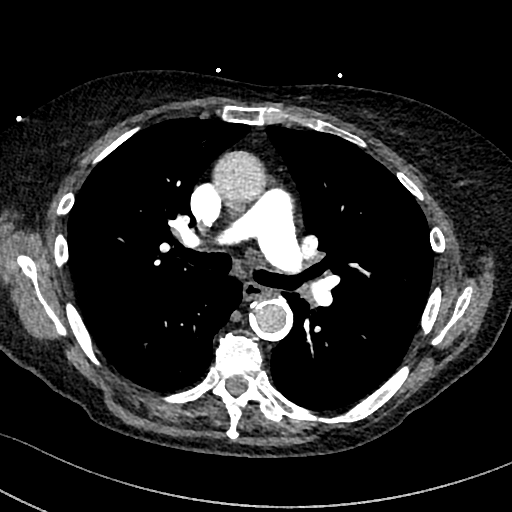
[im 295/443  lung]
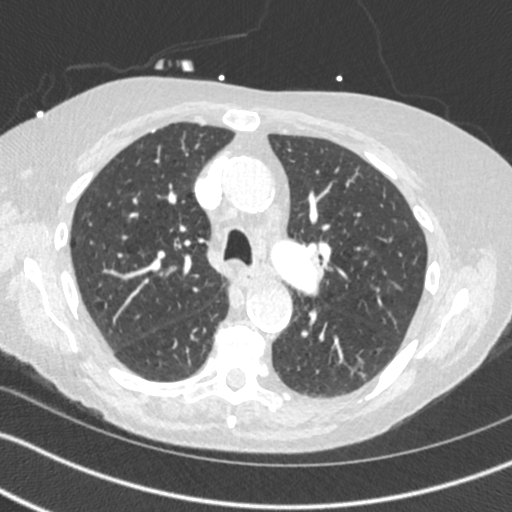
[im 320/443  soft-tissue]
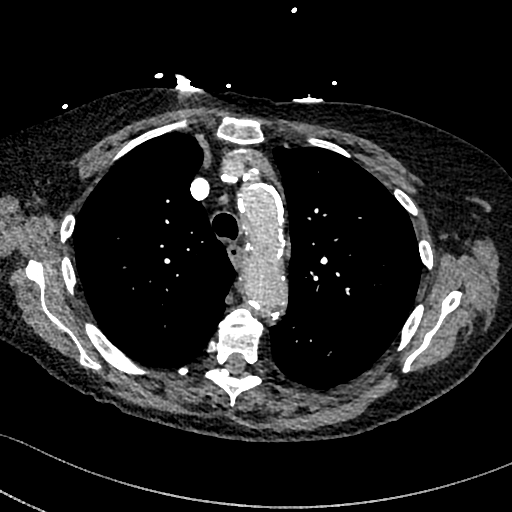
[im 344/443  lung]
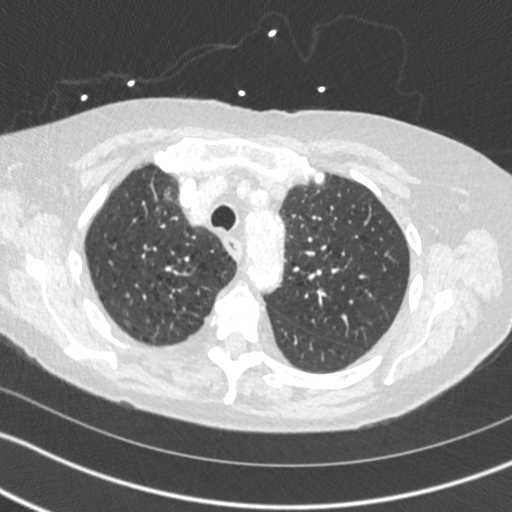
[im 369/443  soft-tissue]
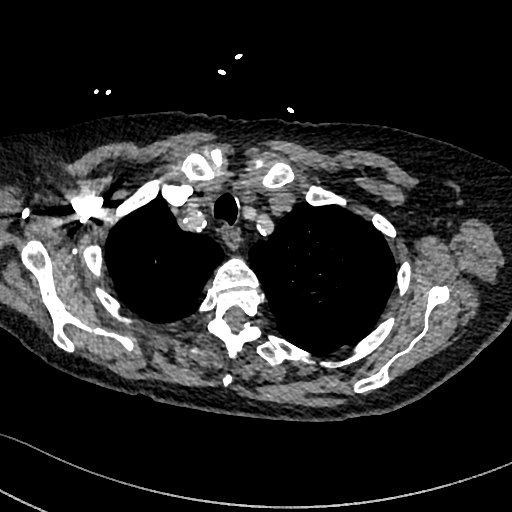
[im 393/443  lung]
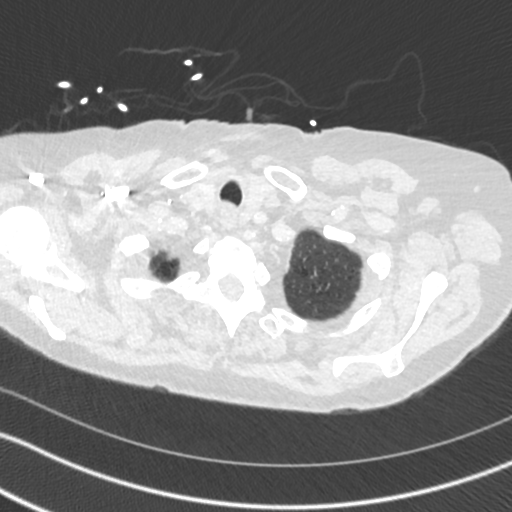
[im 418/443  soft-tissue]
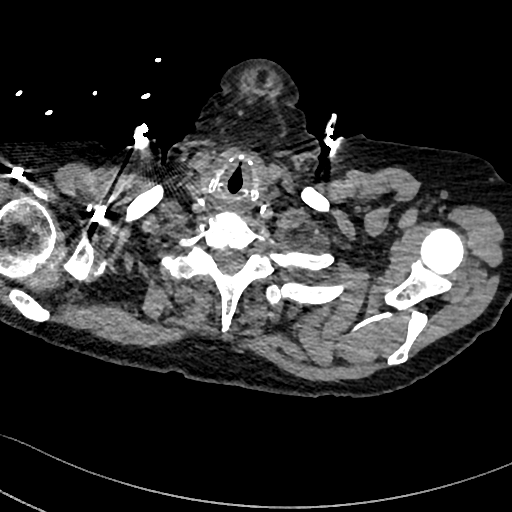

[Series 8: cor · coronal · 0.61mm/px · 3 of 151 slices shown]
[im 38/151  soft-tissue]
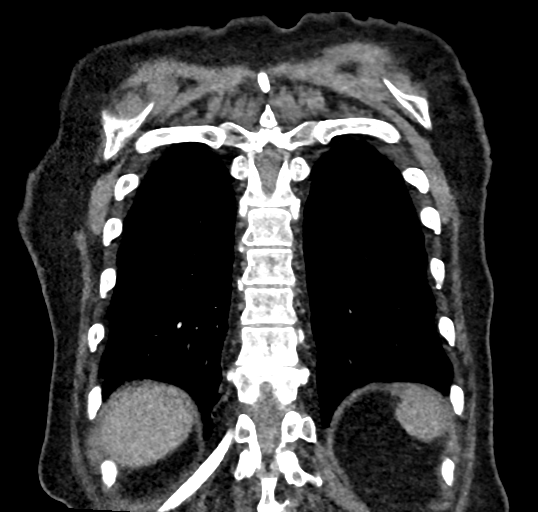
[im 76/151  soft-tissue]
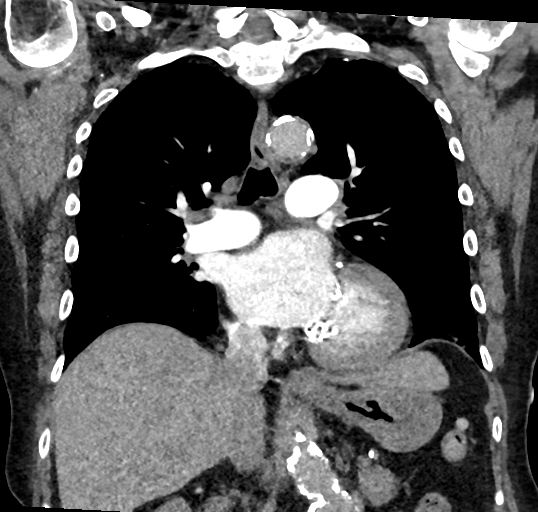
[im 113/151  soft-tissue]
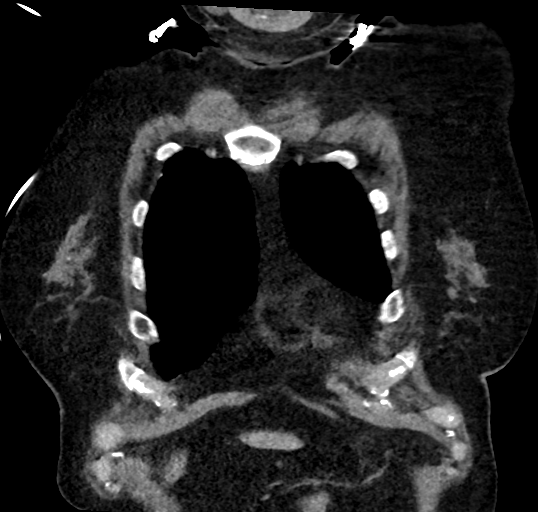

[19 of 46 positions shown; findings below may reference images not displayed]

FINDINGS: Cardiovascular: Negative for pulmonary embolism. Pulmonary arteries
normal in caliber.

Advanced coronary calcification. Heart size mildly enlarged. No
pericardial effusion. Advanced atherosclerotic disease in the
thoracic aorta without aneurysm.

Mediastinum/Nodes: Negative for mass or adenopathy

Lungs/Pleura: Mild apical emphysema. Lungs are clear without
infiltrate effusion or mass.

Upper Abdomen: Advanced atherosclerotic disease in the abdominal
aorta which is heavily calcified and aneurysmal. This is
incompletely evaluated but appears similar to the prior CT abdomen
study. At that time, the maximal AP diameter of the abdominal aortic
aneurysm was 4.6 cm.

Musculoskeletal: Thoracic disc degeneration and spurring.

Review of the MIP images confirms the above findings.
IMPRESSION: Negative for pulmonary embolism.

Advanced atherosclerotic disease in the coronary arteries and aorta.
Aneurysmal dilatation of the abdominal aortic aneurysm, incompletely
evaluated on the study

Apical emphysema.  Lungs are clear.

Aortic Atherosclerosis (R9ZDC-ZGQ.Q) and Emphysema (R9ZDC-[id number].T).

## 2019-09-20 DIAGNOSIS — I10 Essential (primary) hypertension: Secondary | ICD-10-CM | POA: Diagnosis not present

## 2019-09-20 DIAGNOSIS — Z743 Need for continuous supervision: Secondary | ICD-10-CM | POA: Diagnosis not present

## 2019-09-23 ENCOUNTER — Other Ambulatory Visit: Payer: Self-pay | Admitting: Cardiovascular Disease

## 2019-09-23 ENCOUNTER — Other Ambulatory Visit: Payer: Self-pay | Admitting: Internal Medicine

## 2019-10-23 ENCOUNTER — Other Ambulatory Visit: Payer: Self-pay | Admitting: Internal Medicine

## 2019-11-27 ENCOUNTER — Other Ambulatory Visit: Payer: Self-pay | Admitting: Internal Medicine

## 2020-01-12 ENCOUNTER — Ambulatory Visit (INDEPENDENT_AMBULATORY_CARE_PROVIDER_SITE_OTHER): Payer: Medicare Other | Admitting: Internal Medicine

## 2020-01-12 ENCOUNTER — Ambulatory Visit: Payer: Self-pay

## 2020-01-12 ENCOUNTER — Other Ambulatory Visit: Payer: Self-pay | Admitting: Internal Medicine

## 2020-01-12 ENCOUNTER — Encounter: Payer: Self-pay | Admitting: Internal Medicine

## 2020-01-12 ENCOUNTER — Other Ambulatory Visit: Payer: Self-pay

## 2020-01-12 VITALS — BP 160/88 | HR 68 | Temp 98.1°F | Ht 63.0 in | Wt 129.0 lb

## 2020-01-12 DIAGNOSIS — R609 Edema, unspecified: Secondary | ICD-10-CM

## 2020-01-12 DIAGNOSIS — M10079 Idiopathic gout, unspecified ankle and foot: Secondary | ICD-10-CM

## 2020-01-12 DIAGNOSIS — I10 Essential (primary) hypertension: Secondary | ICD-10-CM

## 2020-01-12 DIAGNOSIS — E559 Vitamin D deficiency, unspecified: Secondary | ICD-10-CM | POA: Diagnosis not present

## 2020-01-12 DIAGNOSIS — R202 Paresthesia of skin: Secondary | ICD-10-CM

## 2020-01-12 DIAGNOSIS — M1990 Unspecified osteoarthritis, unspecified site: Secondary | ICD-10-CM

## 2020-01-12 DIAGNOSIS — E785 Hyperlipidemia, unspecified: Secondary | ICD-10-CM

## 2020-01-12 MED ORDER — B COMPLEX PO TABS: 1.0000 | ORAL_TABLET | Freq: Every day | ORAL | 3 refills | Status: DC

## 2020-01-12 MED ORDER — TRAMADOL HCL 50 MG PO TABS: 25.0000 mg | ORAL_TABLET | Freq: Three times a day (TID) | ORAL | 2 refills | Status: DC | PRN

## 2020-01-12 MED ORDER — METHYLPREDNISOLONE ACETATE 80 MG/ML IJ SUSP
80.0000 mg | Freq: Once | INTRAMUSCULAR | Status: AC
  Administered 2020-01-12: 80 mg via INTRAMUSCULAR

## 2020-01-12 NOTE — Assessment & Plan Note (Signed)
Tramadol prn  Potential benefits of a long term opioids use as well as potential risks (i.e. addiction risk, apnea etc) and complications (i.e. Somnolence, constipation and others) were explained to the patient and were aknowledged. Vit D

## 2020-01-12 NOTE — Progress Notes (Signed)
Subjective:  Patient ID: Tammy Brooks, female    DOB: 02/04/1934  Age: 86 y.o. MRN: [id number]  CC: No chief complaint on file.   HPI Tammy Brooks presents for HTN, gout, dyslipidemia, COPD C/o B feet  Outpatient Medications Prior to Visit  Medication Sig Dispense Refill  . allopurinol (ZYLOPRIM) 100 MG tablet TAKE 1 TABLET BY MOUTH  DAILY 90 tablet 3  . amLODipine (NORVASC) 5 MG tablet TAKE 1 TABLET BY MOUTH  DAILY 90 tablet 3  . atorvastatin (LIPITOR) 40 MG tablet TAKE 2 TABLETS BY MOUTH  DAILY 180 tablet 3  . Cholecalciferol (VITAMIN D3) 1.25 MG (50000 UT) CAPS Take 1 capsule by mouth once a week. 9 capsule 0  . Cholecalciferol (VITAMIN D3) 50 MCG (2000 UT) capsule Take 1 capsule (2,000 Units total) by mouth daily. 100 capsule 3  . clopidogrel (PLAVIX) 75 MG tablet TAKE 1 TABLET BY MOUTH  DAILY 90 tablet 3  . colchicine 0.6 MG tablet Take 1 tablet (0.6 mg total) by mouth 4 (four) times daily as needed. (Patient taking differently: Take 0.6 mg by mouth 4 (four) times daily as needed (for gout flares). ) 360 tablet 2  . furosemide (LASIX) 20 MG tablet TAKE 1-2 TABLETS BY MOUTH  DAILY AS NEEDED FOR EDEMA. (Patient taking differently: Take 20-40 mg by mouth daily as needed for edema. ) 180 tablet 1  . isosorbide mononitrate (IMDUR) 30 MG 24 hr tablet TAKE 1 TABLET BY MOUTH  DAILY 90 tablet 3  . levothyroxine (SYNTHROID) 50 MCG tablet TAKE 1 TABLET BY MOUTH  DAILY 90 tablet 2  . losartan (COZAAR) 25 MG tablet TAKE 1 TABLET BY MOUTH  DAILY 90 tablet 3  . metoprolol tartrate (LOPRESSOR) 25 MG tablet TAKE ONE-HALF TABLET BY  MOUTH TWO TIMES DAILY 90 tablet 3  . Multiple Vitamins-Minerals (PRESERVISION/LUTEIN) CAPS Take 1 capsule by mouth daily with breakfast.    . naproxen sodium (ALEVE) 220 MG tablet Take 440 mg by mouth 2 (two) times daily as needed (for back pain).     . nitroGLYCERIN (NITROSTAT) 0.4 MG SL tablet Place 0.4 mg under the tongue every 5 (five) minutes as needed for chest pain.     Marland Kitchen tiZANidine (ZANAFLEX) 4 MG tablet Take 1 tablet (4 mg total) by mouth every 8 (eight) hours as needed for muscle spasms. 90 tablet 2  . traMADol (ULTRAM) 50 MG tablet Take 1 tablet (50 mg total) by mouth every 8 (eight) hours as needed. (Patient taking differently: Take 50 mg by mouth every 8 (eight) hours as needed (FOR PAIN). ) 100 tablet 2   No facility-administered medications prior to visit.    ROS: Review of Systems  Constitutional: Negative for activity change, appetite change, chills, fatigue and unexpected weight change.  HENT: Negative for congestion, mouth sores and sinus pressure.   Eyes: Negative for visual disturbance.  Respiratory: Negative for cough and chest tightness.   Gastrointestinal: Negative for abdominal pain and nausea.  Genitourinary: Negative for difficulty urinating, frequency and vaginal pain.  Musculoskeletal: Positive for arthralgias and gait problem. Negative for back pain.  Skin: Negative for pallor and rash.  Neurological: Negative for dizziness, tremors, weakness, numbness and headaches.  Psychiatric/Behavioral: Negative for confusion and sleep disturbance.    Objective:  BP (!) 160/88 (BP Location: Left Arm, Patient Position: Sitting, Cuff Size: Normal)   Pulse 68   Temp 98.1 F (36.7 C) (Oral)   Ht 5\' 3"  (1.6 m)   Wt 129  lb (58.5 kg)   SpO2 97%   BMI 22.85 kg/m   BP Readings from Last 3 Encounters:  01/12/20 (!) 160/88  08/11/19 (!) 143/71  08/02/19 130/78    Wt Readings from Last 3 Encounters:  01/12/20 129 lb (58.5 kg)  08/11/19 129 lb (58.5 kg)  08/02/19 128 lb 12.8 oz (58.4 kg)    Physical Exam Constitutional:      General: She is not in acute distress.    Appearance: She is well-developed.  HENT:     Head: Normocephalic.     Right Ear: External ear normal.     Left Ear: External ear normal.     Nose: Nose normal.  Eyes:     General:        Right eye: No discharge.        Left eye: No discharge.      Conjunctiva/sclera: Conjunctivae normal.     Pupils: Pupils are equal, round, and reactive to light.  Neck:     Thyroid: No thyromegaly.     Vascular: No JVD.     Trachea: No tracheal deviation.  Cardiovascular:     Rate and Rhythm: Normal rate and regular rhythm.     Heart sounds: Normal heart sounds.  Pulmonary:     Effort: No respiratory distress.     Breath sounds: No stridor. No wheezing.  Abdominal:     General: Bowel sounds are normal. There is no distension.     Palpations: Abdomen is soft. There is no mass.     Tenderness: There is no abdominal tenderness. There is no guarding or rebound.  Musculoskeletal:        General: Tenderness present.     Cervical back: Normal range of motion and neck supple.  Lymphadenopathy:     Cervical: No cervical adenopathy.  Skin:    Findings: No erythema or rash.  Neurological:     Mental Status: She is oriented to person, place, and time.     Cranial Nerves: No cranial nerve deficit.     Motor: No abnormal muscle tone.     Coordination: Coordination normal.     Deep Tendon Reflexes: Reflexes normal.  Psychiatric:        Behavior: Behavior normal.        Thought Content: Thought content normal.        Judgment: Judgment normal.    B feet are sensitive Face w/AKs  Lab Results  Component Value Date   WBC 7.5 01/06/2019   HGB 14.0 01/06/2019   HCT 43.3 01/06/2019   PLT 207.0 01/06/2019   GLUCOSE 92 07/12/2019   CHOL 122 07/12/2019   TRIG 94.0 07/12/2019   HDL 32.20 (L) 07/12/2019   LDLDIRECT 76.7 11/17/2008   LDLCALC 71 07/12/2019   ALT 9 07/12/2019   AST 16 07/12/2019   NA 140 07/12/2019   K 4.0 07/12/2019   CL 106 07/12/2019   CREATININE 1.04 07/12/2019   BUN 15 07/12/2019   CO2 27 07/12/2019   TSH 7.68 (H) 07/12/2019    VAS [place] AAA DUPLEX  Result Date: 08/11/2019 ABDOMINAL AORTA STUDY Indications: Follow up exam for known AAA. Risk Factors: Hypertension, current smoker, prior MI.  Comparison Study: Prior AAA duplex  on 01/05/2019 showed a maximum aortic                   diameter of 5.12 cm. Performing Technologist: Delorise Shiner RVT  Examination Guidelines: A complete evaluation includes B-mode imaging, spectral Doppler,  color Doppler, and power Doppler as needed of all accessible portions of each vessel. Bilateral testing is considered an integral part of a complete examination. Limited examinations for reoccurring indications may be performed as noted.  Abdominal Aorta Findings: +-----------+-------+----------+----------+----------+--------+--------+ Location   AP (cm)Trans (cm)PSV (cm/s)Waveform  ThrombusComments +-----------+-------+----------+----------+----------+--------+--------+ Proximal   2.57   2.73      65                                   +-----------+-------+----------+----------+----------+--------+--------+ Mid        5.00   5.12      30                                   +-----------+-------+----------+----------+----------+--------+--------+ Distal     4.85   5.01      26                                   +-----------+-------+----------+----------+----------+--------+--------+ RT CIA Prox2.2    2.5       148       monophasic                 +-----------+-------+----------+----------+----------+--------+--------+ LT CIA Prox1.5    1.4       78        biphasic                   +-----------+-------+----------+----------+----------+--------+--------+  Summary: Abdominal Aorta: There is evidence of abnormal dilatation of the mid and distal Abdominal aorta. There is evidence of abnormal dilation of the Right Common Iliac artery. The largest aortic diameter remains essentially unchanged compared to prior exam. Previous diameter measurement was 5.1 cm obtained on 01/05/2019.  *See table(s) above for measurements and observations.  Electronically signed by Deitra Mayo MD on 08/11/2019 at 1:41:44 PM.   Final     Assessment & Plan:   There are no diagnoses  linked to this encounter.     Follow-up: No follow-ups on file.  Walker Kehr, MD

## 2020-01-12 NOTE — Assessment & Plan Note (Signed)
Amlodipine, Losartan, Toprol

## 2020-01-12 NOTE — Assessment & Plan Note (Addendum)
Depo-medrol IM Labs Allopurinol

## 2020-01-12 NOTE — Assessment & Plan Note (Signed)
On Vit D Labs 

## 2020-01-12 NOTE — Addendum Note (Signed)
Addended by: Lauralee Evener C on: 01/12/2020 11:48 AM   Modules accepted: Orders

## 2020-01-12 NOTE — Assessment & Plan Note (Signed)
Lipitor LFTs

## 2020-01-12 NOTE — Assessment & Plan Note (Signed)
Resolved

## 2020-01-12 NOTE — Assessment & Plan Note (Signed)
B feet - ?neuropathy Labs B complex

## 2020-01-13 LAB — CBC WITH DIFFERENTIAL/PLATELET
Absolute Monocytes: 623 cells/uL (ref 200–950)
Basophils Absolute: 62 cells/uL (ref 0–200)
Basophils Relative: 0.7 %
Eosinophils Absolute: 125 cells/uL (ref 15–500)
Eosinophils Relative: 1.4 %
HCT: 41.9 % (ref 35.0–45.0)
Hemoglobin: 13.6 g/dL (ref 11.7–15.5)
Lymphs Abs: 1994 cells/uL (ref 850–3900)
MCH: 28 pg (ref 27.0–33.0)
MCHC: 32.5 g/dL (ref 32.0–36.0)
MCV: 86.2 fL (ref 80.0–100.0)
MPV: 12.2 fL (ref 7.5–12.5)
Monocytes Relative: 7 %
Neutro Abs: 6097 cells/uL (ref 1500–7800)
Neutrophils Relative %: 68.5 %
Platelets: 204 10*3/uL (ref 140–400)
RBC: 4.86 10*6/uL (ref 3.80–5.10)
RDW: 12.8 % (ref 11.0–15.0)
Total Lymphocyte: 22.4 %
WBC: 8.9 10*3/uL (ref 3.8–10.8)

## 2020-01-13 LAB — COMPLETE METABOLIC PANEL WITH GFR
AG Ratio: 1.8 (calc) (ref 1.0–2.5)
ALT: 14 U/L (ref 6–29)
AST: 18 U/L (ref 10–35)
Albumin: 4 g/dL (ref 3.6–5.1)
Alkaline phosphatase (APISO): 59 U/L (ref 37–153)
BUN/Creatinine Ratio: 18 (calc) (ref 6–22)
BUN: 16 mg/dL (ref 7–25)
CO2: 28 mmol/L (ref 20–32)
Calcium: 9.6 mg/dL (ref 8.6–10.4)
Chloride: 107 mmol/L (ref 98–110)
Creat: 0.91 mg/dL — ABNORMAL HIGH (ref 0.60–0.88)
GFR, Est African American: 67 mL/min/{1.73_m2} (ref >=60)
GFR, Est Non African American: 58 mL/min/{1.73_m2} — ABNORMAL LOW (ref >=60)
Globulin: 2.2 g/dL (calc) (ref 1.9–3.7)
Glucose, Bld: 97 mg/dL (ref 65–99)
Potassium: 4.5 mmol/L (ref 3.5–5.3)
Sodium: 142 mmol/L (ref 135–146)
Total Bilirubin: 0.5 mg/dL (ref 0.2–1.2)
Total Protein: 6.2 g/dL (ref 6.1–8.1)

## 2020-01-13 LAB — VITAMIN D 25 HYDROXY (VIT D DEFICIENCY, FRACTURES): Vit D, 25-Hydroxy: 43 ng/mL (ref 30–100)

## 2020-01-13 LAB — URIC ACID: Uric Acid, Serum: 4.6 mg/dL (ref 2.5–7.0)

## 2020-01-13 LAB — VITAMIN B12: Vitamin B-12: 258 pg/mL (ref 200–1100)

## 2020-01-21 ENCOUNTER — Ambulatory Visit (HOSPITAL_COMMUNITY): Payer: Medicare Other | Attending: Cardiology

## 2020-01-21 ENCOUNTER — Other Ambulatory Visit: Payer: Self-pay

## 2020-01-21 DIAGNOSIS — I350 Nonrheumatic aortic (valve) stenosis: Secondary | ICD-10-CM

## 2020-01-21 LAB — ECHOCARDIOGRAM COMPLETE
AR max vel: 0.66 cm2
AV Area VTI: 0.74 cm2
AV Area mean vel: 0.72 cm2
AV Mean grad: 34 mmHg
AV Peak grad: 78.9 mmHg
Ao pk vel: 4.44 m/s
Area-P 1/2: 1.83 cm2
P 1/2 time: 433 msec
S' Lateral: 1.9 cm

## 2020-02-01 ENCOUNTER — Other Ambulatory Visit: Payer: Self-pay

## 2020-02-01 ENCOUNTER — Ambulatory Visit: Payer: Medicare Other | Admitting: Cardiovascular Disease

## 2020-02-01 ENCOUNTER — Encounter: Payer: Self-pay | Admitting: Cardiovascular Disease

## 2020-02-01 DIAGNOSIS — I350 Nonrheumatic aortic (valve) stenosis: Secondary | ICD-10-CM

## 2020-02-01 DIAGNOSIS — I2510 Atherosclerotic heart disease of native coronary artery without angina pectoris: Secondary | ICD-10-CM

## 2020-02-01 DIAGNOSIS — F17200 Nicotine dependence, unspecified, uncomplicated: Secondary | ICD-10-CM

## 2020-02-01 DIAGNOSIS — E785 Hyperlipidemia, unspecified: Secondary | ICD-10-CM | POA: Diagnosis not present

## 2020-02-01 DIAGNOSIS — Z9861 Coronary angioplasty status: Secondary | ICD-10-CM

## 2020-02-01 DIAGNOSIS — I10 Essential (primary) hypertension: Secondary | ICD-10-CM

## 2020-02-01 NOTE — Progress Notes (Signed)
02/01/2020 EVANGELA HEFFLER   Sep 16, 1933  [id number]  Primary Physician Plotnikov, Evie Lacks, MD Primary Cardiologist: Lorretta Harp MD Lupe Carney, [place]  HPI:  Tammy Brooks is a 86 y.o.  mildly overweight widowed Caucasian female mother of 29, grandmother of 84 grandchildren .I last saw her in the office 01/29/2019.  She has a history of mild CAD by cath in 1997. Her lipid problems include continued tobacco abuse one pack per day, hypertension, and hyperlipidemia all medically treated. She does have mild to moderate aortic stenosis with valve area of 1 cm2 by 2-D echo one year ago.. She also has moderate right internal carotid artery stenosis by duplex ultrasound. She was recently diagnosed with a small abdominal aortic and iliac aneurysm followed by Dr. Donnetta Hutching. She denies chest pain or shortness of breath. She was found to have a pelvic mass which was surgically excised last year and found to be benign. She does have a hernia as result of this. She has been seen in the emergency room once for chest pain and rule out PE. CTA was negative for this although it did show coronary calcification consistent with atherosclerosis. She was scheduled for a Myoview stress test which has been delayed until September because of fiscalconstraints. She does continue to smoke a pack a day. She currently denies chest pain or shortness of breath. Since I saw her 3 months ago a follow-up echo shown stable moderate aortic stenosis with normal LV function. She saw Kerin Ransom in the office 02/11/2018 who began her on indoor which it has resulted in marked improvement in her chest pain. She also has a moderate abdominal aortic aneurysm followed by Dr. Donnetta Hutching measuring 5 cm.  Since I saw her in the office a year ago she continues to remain stable.  Her peak aortic gradient has increased by 2D echo with a valve area 0.74 cm although she denies chest pain or shortness of breath.  She continues to smoke 1/2 pack of  cigarettes a day.  Her abdominal ultrasound shows stable infrarenal abdominal aortic aneurysm measuring 5 cm followed by Dr. Donnetta Hutching.    Current Meds  Medication Sig  . allopurinol (ZYLOPRIM) 100 MG tablet TAKE 1 TABLET BY MOUTH  DAILY  . amLODipine (NORVASC) 5 MG tablet TAKE 1 TABLET BY MOUTH  DAILY  . atorvastatin (LIPITOR) 40 MG tablet TAKE 2 TABLETS BY MOUTH  DAILY  . b complex vitamins tablet Take 1 tablet by mouth daily.  . Cholecalciferol (VITAMIN D3) 1.25 MG (50000 UT) CAPS Take 1 capsule by mouth once a week.  . Cholecalciferol (VITAMIN D3) 50 MCG (2000 UT) capsule Take 1 capsule (2,000 Units total) by mouth daily.  . clopidogrel (PLAVIX) 75 MG tablet TAKE 1 TABLET BY MOUTH  DAILY  . furosemide (LASIX) 20 MG tablet TAKE 1-2 TABLETS BY MOUTH  DAILY AS NEEDED FOR EDEMA. (Patient taking differently: Take 20-40 mg by mouth daily as needed for edema. )  . isosorbide mononitrate (IMDUR) 30 MG 24 hr tablet TAKE 1 TABLET BY MOUTH  DAILY  . levothyroxine (SYNTHROID) 50 MCG tablet TAKE 1 TABLET BY MOUTH  DAILY  . losartan (COZAAR) 25 MG tablet TAKE 1 TABLET BY MOUTH  DAILY  . metoprolol tartrate (LOPRESSOR) 25 MG tablet TAKE ONE-HALF TABLET BY  MOUTH TWO TIMES DAILY  . Multiple Vitamins-Minerals (PRESERVISION/LUTEIN) CAPS Take 1 capsule by mouth daily with breakfast.  . naproxen sodium (ALEVE) 220 MG tablet Take 440 mg by mouth  2 (two) times daily as needed (for back pain).   . nitroGLYCERIN (NITROSTAT) 0.4 MG SL tablet Place 0.4 mg under the tongue every 5 (five) minutes as needed for chest pain.  Marland Kitchen tiZANidine (ZANAFLEX) 4 MG tablet Take 1 tablet (4 mg total) by mouth every 8 (eight) hours as needed for muscle spasms.  . traMADol (ULTRAM) 50 MG tablet Take 0.5-1 tablets (25-50 mg total) by mouth every 8 (eight) hours as needed.     Allergies  Allergen Reactions  . Nabumetone Anaphylaxis, Swelling and Other (See Comments)    Causes inflammation of airways  . Tape Other (See Comments)     SKIN IS VERY THIN AND TEARS EASILY- PLEASE USE AN ALTERNATIVE TO TAPE, IF POSSIBLE!!    Social History   Socioeconomic History  . Marital status: Widowed    Spouse name: Not on file  . Number of children: 5  . Years of education: 9  . Highest education level: 9th grade  Occupational History  . Occupation: retired    Comment: retired  Tobacco Use  . Smoking status: Current Every Day Smoker    Packs/day: 0.50    Years: 62.00    Pack years: 31.00    Types: Cigarettes  . Smokeless tobacco: Never Used  Vaping Use  . Vaping Use: Never used  Substance and Sexual Activity  . Alcohol use: Yes    Alcohol/week: 0.0 standard drinks    Comment: occasionally - few drinks/month  . Drug use: No  . Sexual activity: Never    Partners: Male    Birth control/protection: None, Surgical  Other Topics Concern  . Not on file  Social History Narrative   9th grade. married '54- widowed Dec 10th'08. 4 sons - '55, '58, '60, '69; 1 daughter - '63; 12 grandchildren; 9 great-grandchildren. 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 Other children are close and attentive. ACP/End-of-life: No CPR, no mechanical ventilation, no futile/heroic measures.   Social Determinants of Health   Financial Resource Strain:   . Difficulty of Paying Living Expenses: Not on file  Food Insecurity:   . Worried About Charity fundraiser in the Last Year: Not on file  . Ran Out of Food in the Last Year: Not on file  Transportation Needs:   . Lack of Transportation (Medical): Not on file  . Lack of Transportation (Non-Medical): Not on file  Physical Activity:   . Days of Exercise per Week: Not on file  . Minutes of Exercise per Session: Not on file  Stress:   . Feeling of Stress : Not on file  Social Connections:   . Frequency of Communication with Friends and Family: Not on file  . Frequency of Social Gatherings with Friends and Family: Not on file  . Attends Religious  Services: Not on file  . Active Member of Clubs or Organizations: Not on file  . Attends Archivist Meetings: Not on file  . Marital Status: Not on file  Intimate Partner Violence:   . Fear of Current or Ex-Partner: Not on file  . Emotionally Abused: Not on file  . Physically Abused: Not on file  . Sexually Abused: Not on file     Review of Systems: General: negative for chills, fever, night sweats or weight changes.  Cardiovascular: negative for chest pain, dyspnea on exertion, edema, orthopnea, palpitations, paroxysmal nocturnal dyspnea or shortness of breath Dermatological: negative for rash Respiratory: negative for cough  or wheezing Urologic: negative for hematuria Abdominal: negative for nausea, vomiting, diarrhea, bright red blood per rectum, melena, or hematemesis Neurologic: negative for visual changes, syncope, or dizziness All other systems reviewed and are otherwise negative except as noted above.    Blood pressure 136/84, pulse 66, height 5\' 3"  (1.6 m), weight 130 lb 3.2 oz (59.1 kg), SpO2 99 %.  General appearance: alert and no distress Neck: no adenopathy, no JVD, supple, symmetrical, trachea midline, thyroid not enlarged, symmetric, no tenderness/mass/nodules and Bilateral carotid bruits versus transmitted murmur.  Her last carotid Doppler studies performed several years ago showed only mild left ICA stenosis. Lungs: clear to auscultation bilaterally Heart: 2/6 systolic ejection murmur at the base consistent with aortic stenosis Extremities: extremities normal, atraumatic, no cyanosis or edema Pulses: 2+ and symmetric Skin: Skin color, texture, turgor normal. No rashes or lesions Neurologic: Alert and oriented X 3, normal strength and tone. Normal symmetric reflexes. Normal coordination and gait  EKG not performed today  ASSESSMENT AND PLAN:   Dyslipidemia History of dyslipidemia on statin therapy with lipid profile performed 07/12/2019 revealing a total  cholesterol of 122, LDL 71 and HDL 32.  Essential hypertension History of essential hypertension a blood pressure measured today 136/84.  She is on amlodipine losartan and metoprolol.  Peripheral vascular disease ([place]) History of PAD with a moderate size infrarenal abdominal aortic aneurysm measuring 5 cm followed by Dr. Donnetta Hutching.  CAD S/P percutaneous coronary angioplasty History of CAD status post cath back in 97 which showed only mild disease.  She does have coronary calcification on chest CTA.  She denies chest pain.  Tobacco dependence Ongoing tobacco use of 1/2 pack/day recalcitrant risk factor modification.  Severe aortic stenosis History of severe aortic stenosis by 2D echocardiogram most recently performed 01/21/2020 with normal LV function, valve area 0.74 cm with a peak gradient of 80 mmHg.  She continues to be asymptomatic however.      Lorretta Harp MD FACP,FACC,FAHA, [facility] 02/01/2020 10:21 AM

## 2020-02-01 NOTE — Assessment & Plan Note (Signed)
History of PAD with a moderate size infrarenal abdominal aortic aneurysm measuring 5 cm followed by Dr. Donnetta Hutching.

## 2020-02-01 NOTE — Assessment & Plan Note (Signed)
History of severe aortic stenosis by 2D echocardiogram most recently performed 01/21/2020 with normal LV function, valve area 0.74 cm with a peak gradient of 80 mmHg.  She continues to be asymptomatic however.

## 2020-02-01 NOTE — Assessment & Plan Note (Signed)
History of dyslipidemia on statin therapy with lipid profile performed 07/12/2019 revealing a total cholesterol of 122, LDL 71 and HDL 32.

## 2020-02-01 NOTE — Assessment & Plan Note (Signed)
Ongoing tobacco use of 1/2 pack/day recalcitrant risk factor modification.

## 2020-02-01 NOTE — Assessment & Plan Note (Signed)
History of CAD status post cath back in 86 which showed only mild disease.  She does have coronary calcification on chest CTA.  She denies chest pain.

## 2020-02-01 NOTE — Assessment & Plan Note (Signed)
History of essential hypertension a blood pressure measured today 136/84.  She is on amlodipine losartan and metoprolol.

## 2020-02-01 NOTE — Patient Instructions (Signed)
Medication Instructions:  The current medical regimen is effective;  continue present plan and medications.  *If you need a refill on your cardiac medications before your next appointment, please call your pharmacy*   Testing/Procedures: Echocardiogram (12 months) - Your physician has requested that you have an echocardiogram. Echocardiography is a painless test that uses sound waves to create images of your heart. It provides your doctor with information about the size and shape of your heart and how well your heart's chambers and valves are working. This procedure takes approximately one hour. There are no restrictions for this procedure. This will be performed at our [facility] location - [address].    Follow-Up: At [facility], you and your health needs are our priority.  As part of our continuing mission to provide you with exceptional heart care, we have created designated Provider Care Teams.  These Care Teams include your primary Cardiologist (physician) and Advanced Practice Providers (APPs -  Physician Assistants and Nurse Practitioners) who all work together to provide you with the care you need, when you need it.  We recommend signing up for the patient portal called "MyChart".  Sign up information is provided on this After Visit Summary.  MyChart is used to connect with patients for Virtual Visits (Telemedicine).  Patients are able to view lab/test results, encounter notes, upcoming appointments, etc.  Non-urgent messages can be sent to your provider as well.   To learn more about what you can do with MyChart, go to [email or web address].    Your next appointment:   12 month(s)  The format for your next appointment:   In Person  Provider:   Quay Burow, MD

## 2020-02-20 ENCOUNTER — Other Ambulatory Visit: Payer: Self-pay | Admitting: Internal Medicine

## 2020-03-06 DIAGNOSIS — H524 Presbyopia: Secondary | ICD-10-CM | POA: Diagnosis not present

## 2020-03-06 DIAGNOSIS — H353131 Nonexudative age-related macular degeneration, bilateral, early dry stage: Secondary | ICD-10-CM | POA: Diagnosis not present

## 2020-03-06 DIAGNOSIS — H44512 Absolute glaucoma, left eye: Secondary | ICD-10-CM | POA: Diagnosis not present

## 2020-03-06 DIAGNOSIS — H5211 Myopia, right eye: Secondary | ICD-10-CM | POA: Diagnosis not present

## 2020-03-06 DIAGNOSIS — H548 Legal blindness, as defined in USA: Secondary | ICD-10-CM | POA: Diagnosis not present

## 2020-03-27 ENCOUNTER — Other Ambulatory Visit: Payer: Self-pay | Admitting: *Deleted

## 2020-03-27 DIAGNOSIS — I714 Abdominal aortic aneurysm, without rupture, unspecified: Secondary | ICD-10-CM

## 2020-04-18 ENCOUNTER — Other Ambulatory Visit: Payer: Self-pay | Admitting: Internal Medicine

## 2020-04-24 ENCOUNTER — Other Ambulatory Visit: Payer: Self-pay

## 2020-04-24 ENCOUNTER — Ambulatory Visit (HOSPITAL_COMMUNITY)
Admission: RE | Admit: 2020-04-24 | Discharge: 2020-04-24 | Disposition: A | Discharge status: Home / Self Care | Payer: Medicare Other | Source: Ambulatory Visit | Attending: Physician Assistant | Admitting: Physician Assistant

## 2020-04-24 ENCOUNTER — Ambulatory Visit (INDEPENDENT_AMBULATORY_CARE_PROVIDER_SITE_OTHER): Payer: Medicare Other | Admitting: Physician Assistant

## 2020-04-24 ENCOUNTER — Ambulatory Visit
Admission: RE | Admit: 2020-04-24 | Discharge: 2020-04-24 | Disposition: A | Discharge status: Home / Self Care | Payer: Medicare Other | Source: Ambulatory Visit | Attending: Vascular Surgery | Admitting: Vascular Surgery

## 2020-04-24 VITALS — BP 115/67 | HR 57 | Temp 97.1°F | Resp 14 | Ht 63.0 in | Wt 127.0 lb

## 2020-04-24 DIAGNOSIS — I714 Abdominal aortic aneurysm, without rupture, unspecified: Secondary | ICD-10-CM

## 2020-04-24 MED ORDER — IOPAMIDOL (ISOVUE-370) INJECTION 76%
75.0000 mL | Freq: Once | INTRAVENOUS | Status: AC | PRN
  Administered 2020-04-24: 75 mL via INTRAVENOUS

## 2020-04-24 NOTE — Progress Notes (Signed)
[facility] OF [place] HISTORY AND PHYSICAL   History of Present Illness:  Patient is a 86 y.o. year old female who presents for evaluation of abdominal aortic aneurysm.  The AAA has been followed by Dr. Donnetta Hutching since 2015.  She underwent a lifeline screening and this did show 4-1/2 cm aneurysm.  The AAA has been stable.  Her last duplex has been read at 5.1cm without change in the last year.   Her CT scan revealed that she is not a stent graft candidate.  Dr. Donnetta Hutching had discussed with the pt that her risk of rupture was less than her risk for open AAA repair at her age and coronary status.    She has a hx of coronary stenting in August 2019 when she had a STEMI.  She also has severe aortic stenosis by echo in July 2020.  She was recently seen by cardiology and is asymptomatic from this.  There is plans for f/u echo in August. HTN better control with Amlodipine and Losartan.  Her carotid artery stenosis is followed by Dr. Gwenlyn Found and in 2018 she had 1-39% bilateral carotid artery stenosis.   She states she has abdominal pain related to a hernia and she has lumbar pain.  She can't tell if the pain is any different than 6 months ago.    The pt is on a statin for cholesterol management.    The pt is not on an aspirin.    Other AC:  Plavix The pt is on CCB, ARB, BB for hypertension.  The pt does not have diabetes. Tobacco hx:  Current:  ~ 1/2 ppd   Past Medical History:  Diagnosis Date  . Abdominal aortic aneurysm ([place])    followed by Dr. Sherren Mocha Early  . Adenomatous colon polyp 1998  . Arthritis   . Blind left eye   . CAD (coronary artery disease) 08/1995   50-55% second diag dz.; occluded OM  . Complication of anesthesia    "Blood pressure went down" with retinal surgery - no problems with previous surgeries  . Echocardiogram abnormal 03/2012   mild to mod AS, valve area1.58 cm2, mild concentric LVH, grade I diastolic dusfunction  . Gout   . Heart murmur   . Hyperlipidemia   .  Hypertension   . MI (myocardial infarction) ([place]) 01/25/2018  . Myocardial infarction [facility])    mild/ age 88  . PAD (peripheral artery disease) ([facility])    mild to mod Rt ICA stenosis followed by doppler-02/05/12  . Pelvic mass   . Stroke [facility]) 2001   no residual problems  . Tobacco abuse   . Ulcer     Past Surgical History:  Procedure Laterality Date  . ABDOMINAL HYSTERECTOMY    . APPENDECTOMY    . CARDIAC CATHETERIZATION  08/1995   50-55% second diag lesion, occluded OM  . CATARACT EXTRACTION W/ INTRAOCULAR LENS  IMPLANT, BILATERAL    . COLONOSCOPY    . EYE SURGERY     lense implant removed/ blind in left eye  . LAPAROTOMY N/A 04/26/2014   Procedure: EXPLORATORY LAPAROTOMY/RESECTION OF PELVIC MASS;  Surgeon: Alvino Chapel, MD;  Location: [facility];  Service: Gynecology;  Laterality: N/A;  . OOPHORECTOMY     and fallopian tube  . POLYPECTOMY    . RETINAL DETACHMENT SURGERY  2006  . TONSILLECTOMY AND ADENOIDECTOMY    . TUBAL LIGATION    . UPPER GASTROINTESTINAL ENDOSCOPY     treated for h-pylori  Social History Social History   Tobacco Use  . Smoking status: Current Every Day Smoker    Packs/day: 0.50    Years: 62.00    Pack years: 31.00    Types: Cigarettes  . Smokeless tobacco: Never Used  Vaping Use  . Vaping Use: Never used  Substance Use Topics  . Alcohol use: Yes    Alcohol/week: 0.0 standard drinks    Comment: occasionally - few drinks/month  . Drug use: No    Family History Family History  Problem Relation Age of Onset  . Aneurysm Mother        AAA  . Hypertension Mother   . Hyperlipidemia Mother   . Heart disease Father        CAD, MI x 3  . Heart attack Father   . Cancer Maternal Aunt        breast cancer  . Diabetes Sister   . Hyperlipidemia Sister   . Hypertension Sister   . Colon cancer Neg Hx   . Stomach cancer Neg Hx     Allergies  Allergies  Allergen Reactions  . Nabumetone Anaphylaxis, Swelling and Other (See Comments)     Causes inflammation of airways  . Tape Other (See Comments)    SKIN IS VERY THIN AND TEARS EASILY- PLEASE USE AN ALTERNATIVE TO TAPE, IF POSSIBLE!!     Current Outpatient Medications  Medication Sig Dispense Refill  . allopurinol (ZYLOPRIM) 100 MG tablet TAKE 1 TABLET BY MOUTH  DAILY 90 tablet 3  . amLODipine (NORVASC) 5 MG tablet TAKE 1 TABLET BY MOUTH  DAILY 90 tablet 3  . atorvastatin (LIPITOR) 40 MG tablet TAKE 2 TABLETS BY MOUTH  DAILY 180 tablet 3  . b complex vitamins tablet Take 1 tablet by mouth daily. 100 tablet 3  . Cholecalciferol (VITAMIN D3) 50 MCG (2000 UT) capsule Take 1 capsule (2,000 Units total) by mouth daily. 100 capsule 3  . clopidogrel (PLAVIX) 75 MG tablet TAKE 1 TABLET BY MOUTH  DAILY 90 tablet 3  . furosemide (LASIX) 20 MG tablet TAKE 1-2 TABLETS BY MOUTH  DAILY AS NEEDED FOR EDEMA. (Patient taking differently: Take 20-40 mg by mouth daily as needed for edema. ) 180 tablet 1  . isosorbide mononitrate (IMDUR) 30 MG 24 hr tablet TAKE 1 TABLET BY MOUTH  DAILY 90 tablet 3  . levothyroxine (SYNTHROID) 50 MCG tablet TAKE 1 TABLET BY MOUTH  DAILY 90 tablet 2  . losartan (COZAAR) 25 MG tablet TAKE 1 TABLET BY MOUTH  DAILY 90 tablet 3  . metoprolol tartrate (LOPRESSOR) 25 MG tablet TAKE ONE-HALF TABLET BY  MOUTH TWICE DAILY 90 tablet 3  . Multiple Vitamins-Minerals (PRESERVISION/LUTEIN) CAPS Take 1 capsule by mouth daily with breakfast.    . naproxen sodium (ALEVE) 220 MG tablet Take 440 mg by mouth 2 (two) times daily as needed (for back pain).     . nitroGLYCERIN (NITROSTAT) 0.4 MG SL tablet Place 0.4 mg under the tongue every 5 (five) minutes as needed for chest pain.    Marland Kitchen tiZANidine (ZANAFLEX) 4 MG tablet Take 1 tablet (4 mg total) by mouth every 8 (eight) hours as needed for muscle spasms. 90 tablet 2  . traMADol (ULTRAM) 50 MG tablet Take 0.5-1 tablets (25-50 mg total) by mouth every 8 (eight) hours as needed. 100 tablet 2  . Cholecalciferol (VITAMIN D3) 1.25 MG  (50000 UT) CAPS Take 1 capsule by mouth once a week. (Patient not taking: Reported on 04/24/2020) 9 capsule  0   No current facility-administered medications for this visit.    ROS:   General:  No weight loss, Fever, chills  HEENT: No recent headaches, no nasal bleeding, no visual changes, no sore throat  Neurologic: No dizziness, blackouts, seizures. No recent symptoms of stroke or mini- stroke. No recent episodes of slurred speech, or temporary blindness.  Cardiac: No recent episodes of chest pain/pressure, no shortness of breath at rest.  No shortness of breath with exertion.  Denies history of atrial fibrillation or irregular heartbeat  Vascular: No history of rest pain in feet.  No history of claudication.  No history of non-healing ulcer, No history of DVT   Pulmonary: No home oxygen, no productive cough, no hemoptysis,  No asthma or wheezing  Musculoskeletal:  [ ]  Arthritis, [ x] Low back pain,  [ ]  Joint pain  Hematologic:No history of hypercoagulable state.  No history of easy bleeding.  No history of anemia  Gastrointestinal: No hematochezia or melena,  No gastroesophageal reflux, no trouble swallowing Known abdominal hernia with pain Urinary: [ ]  chronic Kidney disease, [ ]  on HD - [ ]  MWF or [ ]  TTHS, [ ]  Burning with urination, [ ]  Frequent urination, [ ]  Difficulty urinating;   Skin: No rashes  Psychological: No history of anxiety,  No history of depression   Physical Examination  Vitals:   04/24/20 0924  BP: 115/67  Pulse: (!) 57  Resp: 14  Temp: (!) 97.1 F (36.2 C)  TempSrc: Temporal  SpO2: 96%  Weight: 127 lb (57.6 kg)  Height: 5\' 3"  (1.6 m)    Body mass index is 22.5 kg/m.  General:  Alert and oriented, no acute distress HEENT: Normal Neck: No bruit or JVD Pulmonary: Clear to auscultation bilaterally Cardiac: Regular Rate and Rhythm without murmur Gastrointestinal: Soft, non-tender, non-distended, no mass, no scars Skin: No rash Extremity  Pulses:  2+ radial, brachial, femoral, dorsalis pedis pulses bilaterally Musculoskeletal: No deformity or edema  Neurologic: Upper and lower extremity motor 5/5 and symmetric  DATA:  Abdominal Aorta Findings:  +-----------+-------+----------+----------+--------+--------+--------+  Location  AP (cm)Trans (cm)PSV (cm/s)WaveformThrombusComments  +-----------+-------+----------+----------+--------+--------+--------+  Proximal  2.00  2.16   81                  +-----------+-------+----------+----------+--------+--------+--------+  Mid    5.46  5.55   28                  +-----------+-------+----------+----------+--------+--------+--------+  Distal   5.08  5.46   29                  +-----------+-------+----------+----------+--------+--------+--------+  RT CIA Prox1.6  1.4    138                  +-----------+-------+----------+----------+--------+--------+--------+  LT CIA Prox1.7  1.4    82                  +-----------+-------+----------+----------+--------+--------+--------+    Summary:  Abdominal Aorta: There is evidence of abnormal dilatation of the mid and  distal Abdominal aorta. The largest aortic diameter has increased compared  to prior exam. Previous diameter measurement was 5.1 cm obtained on  08/11/19.  CTA 2019:  Essentially no neck, with fusiform dilation of the aorta. Greatest diameter on the parasagittal images measures 5.0 cm (image 123 of series 12). Measurement made on the prior CT at this location was 4.6 cm, with slight increase though less than 5 mm.   ASSESSMENT:  AAA now  measuring 5.55 cm transverse which has changed from 5.12 cm 6 months ago She has symptoms of lumbar and abdominal pain that may or may not have changed.  She has a long history of pain secondary to a hernia.   PLAN: I will order a CTA  Abdomin and pelvis and she will f/u with Dr. Stanford Breed Tomorrow to discuss results.  She lives in [place] and is followed in the past by Dr. Donnetta Hutching.  The CT suggested the need for open repair with risk secondary to her history of comorbities.  If she develops unrelenting pain she will need to call 911.   Roxy Horseman PA-C [facility] [place] Office: [phone]  MD on call Carlis Abbott

## 2020-04-25 ENCOUNTER — Ambulatory Visit: Payer: Medicare Other | Admitting: Vascular Surgery

## 2020-04-25 ENCOUNTER — Encounter: Payer: Self-pay | Admitting: Vascular Surgery

## 2020-04-25 VITALS — BP 128/73 | HR 62 | Temp 97.3°F | Resp 20 | Ht 63.0 in | Wt 127.0 lb

## 2020-04-25 DIAGNOSIS — I714 Abdominal aortic aneurysm, without rupture, unspecified: Secondary | ICD-10-CM

## 2020-04-25 NOTE — Progress Notes (Signed)
[facility]ist of [place]  Patient name: Tammy Brooks MRN: [id number] DOB: Nov 27, 1933 Sex: female  REASON FOR VISIT: Follow-up abdominal aortic aneurysm  HPI: Previously: Tammy Brooks is a 86 y.o. female here today for follow-up.  She reports that she had a myocardial infarction since my last visit with her in August 2019.  Had stenting of her coronaries at [facility].  She has recovered from this.  She has no symptoms referable to her aneurysm.  No peripheral disease.  04/25/20: Patient returns to clinic for discussion of CT angiogram results. She continues to have no symptoms referable to her aneurysm. CT confirms interval growth of aneurysm to 59 mm in greatest orthogonal measurement.  I reviewed the CT scan in detail both personally and with the patient. She has a circumferentially calcified descending thoracic aorta, perivisceral aorta, infrarenal aorta, common iliac arteries bilaterally.  She has a hostile neck to endovascular repair.  Neck is both angulated and has displaced the origin of the renal arteries.  Access for endovascular repair would also be difficult given bilateral common femoral artery plaque and bilateral iliac artery stenosis.  Radiologist noted likely lung malignancy in the left lower lobe. I reviewed this with the patient as well. She is understandably shaken by this news.   Past Medical History:  Diagnosis Date  . Abdominal aortic aneurysm ([place])    followed by Dr. Sherren Mocha Early  . Adenomatous colon polyp 1998  . Arthritis   . Blind left eye   . CAD (coronary artery disease) 08/1995   50-55% second diag dz.; occluded OM  . Complication of anesthesia    "Blood pressure went down" with retinal surgery - no problems with previous surgeries  . Echocardiogram abnormal 03/2012   mild to mod AS, valve area1.58 cm2, mild concentric LVH, grade I diastolic dusfunction  . Gout   . Heart murmur   . Hyperlipidemia     . Hypertension   . MI (myocardial infarction) ([place]) 01/25/2018  . Myocardial infarction [facility])    mild/ age 5  . PAD (peripheral artery disease) ([facility])    mild to mod Rt ICA stenosis followed by doppler-02/05/12  . Pelvic mass   . Stroke [facility]) 2001   no residual problems  . Tobacco abuse   . Ulcer     Family History  Problem Relation Age of Onset  . Aneurysm Mother        AAA  . Hypertension Mother   . Hyperlipidemia Mother   . Heart disease Father        CAD, MI x 3  . Heart attack Father   . Cancer Maternal Aunt        breast cancer  . Diabetes Sister   . Hyperlipidemia Sister   . Hypertension Sister   . Colon cancer Neg Hx   . Stomach cancer Neg Hx     SOCIAL HISTORY: Social History   Tobacco Use  . Smoking status: Current Every Day Smoker    Packs/day: 0.50    Years: 62.00    Pack years: 31.00    Types: Cigarettes  . Smokeless tobacco: Never Used  Substance Use Topics  . Alcohol use: Yes    Alcohol/week: 0.0 standard drinks    Comment: occasionally - few drinks/month    Allergies  Allergen Reactions  . Nabumetone Anaphylaxis, Swelling and Other (See Comments)    Causes inflammation of airways  . Tape Other (See Comments)    SKIN  IS VERY THIN AND TEARS EASILY- PLEASE USE AN ALTERNATIVE TO TAPE, IF POSSIBLE!!    Current Outpatient Medications  Medication Sig Dispense Refill  . allopurinol (ZYLOPRIM) 100 MG tablet TAKE 1 TABLET BY MOUTH  DAILY 90 tablet 3  . amLODipine (NORVASC) 5 MG tablet TAKE 1 TABLET BY MOUTH  DAILY 90 tablet 3  . atorvastatin (LIPITOR) 40 MG tablet TAKE 2 TABLETS BY MOUTH  DAILY 180 tablet 3  . b complex vitamins tablet Take 1 tablet by mouth daily. 100 tablet 3  . Cholecalciferol (VITAMIN D3) 50 MCG (2000 UT) capsule Take 1 capsule (2,000 Units total) by mouth daily. 100 capsule 3  . clopidogrel (PLAVIX) 75 MG tablet TAKE 1 TABLET BY MOUTH  DAILY 90 tablet 3  . furosemide (LASIX) 20 MG tablet TAKE 1-2 TABLETS BY MOUTH  DAILY AS  NEEDED FOR EDEMA. (Patient taking differently: Take 20-40 mg by mouth daily as needed for edema. ) 180 tablet 1  . isosorbide mononitrate (IMDUR) 30 MG 24 hr tablet TAKE 1 TABLET BY MOUTH  DAILY 90 tablet 3  . levothyroxine (SYNTHROID) 50 MCG tablet TAKE 1 TABLET BY MOUTH  DAILY 90 tablet 2  . losartan (COZAAR) 25 MG tablet TAKE 1 TABLET BY MOUTH  DAILY 90 tablet 3  . metoprolol tartrate (LOPRESSOR) 25 MG tablet TAKE ONE-HALF TABLET BY  MOUTH TWICE DAILY 90 tablet 3  . Multiple Vitamins-Minerals (PRESERVISION/LUTEIN) CAPS Take 1 capsule by mouth daily with breakfast.    . naproxen sodium (ALEVE) 220 MG tablet Take 440 mg by mouth 2 (two) times daily as needed (for back pain).     . nitroGLYCERIN (NITROSTAT) 0.4 MG SL tablet Place 0.4 mg under the tongue every 5 (five) minutes as needed for chest pain.    Marland Kitchen tiZANidine (ZANAFLEX) 4 MG tablet Take 1 tablet (4 mg total) by mouth every 8 (eight) hours as needed for muscle spasms. 90 tablet 2  . traMADol (ULTRAM) 50 MG tablet Take 0.5-1 tablets (25-50 mg total) by mouth every 8 (eight) hours as needed. 100 tablet 2  . Cholecalciferol (VITAMIN D3) 1.25 MG (50000 UT) CAPS Take 1 capsule by mouth once a week. (Patient not taking: Reported on 04/25/2020) 9 capsule 0   No current facility-administered medications for this visit.    REVIEW OF SYSTEMS:  [X]  denotes positive finding, [ ]  denotes negative finding Cardiac  Comments:  Chest pain or chest pressure:    Shortness of breath upon exertion:    Short of breath when lying flat:    Irregular heart rhythm:        Vascular    Pain in calf, thigh, or hip brought on by ambulation:    Pain in feet at night that wakes you up from your sleep:     Blood clot in your veins:    Leg swelling:           PHYSICAL EXAM: Vitals:   04/25/20 1044  BP: 128/73  Pulse: 62  Resp: 20  Temp: (!) 97.3 F (36.3 C)  SpO2: 99%  Weight: 127 lb (57.6 kg)  Height: 5\' 3"  (1.6 m)    GENERAL: The patient is a  well-nourished female, in no acute distress. The vital signs are documented above. CARDIOVASCULAR: 2+ radial 2+ femoral and 2+ dorsalis pedis pulses bilaterally.  Abdomen nontender and I do not palpate an aneurysm PULMONARY: There is good air exchange  MUSCULOSKELETAL: There are no major deformities or cyanosis. NEUROLOGIC: No focal weakness or  paresthesias are detected. SKIN: There are no ulcers or rashes noted. PSYCHIATRIC: The patient has a normal affect.  DATA:  CT angiogram 04/24/2020 personally reviewed She has a circumferentially calcified descending thoracic aorta, perivisceral aorta, infrarenal aorta, common iliac arteries bilaterally.  She has a hostile neck to endovascular repair.  Neck is both angulated and has displaced the origin of the renal arteries.  Access for endovascular repair would also be difficult given bilateral common femoral artery plaque and bilateral iliac artery stenosis.  Report copied below.  CLINICAL DATA:  Abdominal aortic aneurysm, surveillance  EXAM: CTA ABDOMEN AND PELVIS WITHOUT AND WITH CONTRAST  TECHNIQUE: Multidetector CT imaging of the abdomen and pelvis was performed using the standard protocol during bolus administration of intravenous contrast. Multiplanar reconstructed images and MIPs were obtained and reviewed to evaluate the vascular anatomy.  CONTRAST:  17mL ISOVUE-370 IOPAMIDOL (ISOVUE-370) INJECTION 76%  COMPARISON:  Prior CTA abdomen/pelvis 01/06/2018  FINDINGS: VASCULAR  Aorta: Enlarging fusiform aneurysm of the highly tortuous abdominal aorta. Measurements are obtained on the coronal and reformatted images with maximal dimensions of 5.7 by 5.9 cm compared to 5.1 by 5.0 cm previously. Extensive wall adherent mural thrombus throughout the aneurysmal portion of the aorta. Extensive atherosclerotic calcifications are also again noted.  Celiac: Calcified atherosclerotic plaque at the origin results in mild stenosis.  SMA:  Calcified atherosclerotic plaque at the origin results in mild stenosis.  Renals: Both renal arteries are patent without evidence of aneurysm, dissection, vasculitis, fibromuscular dysplasia or significant stenosis.  IMA: Occluded at the origin. The vessel opacifies distally from collateral flow.  Inflow: Calcified plaque results in moderate stenosis at the origin of the right common iliac artery. Mild aneurysmal dilation of the left common iliac artery measuring up to 1.8 cm. Both internal iliac arteries are patent. The external iliac arteries are diseased but without dissection, aneurysm or significant stenosis.  Proximal Outflow: Bilateral common femoral and visualized portions of the superficial and profunda femoral arteries are patent without evidence of aneurysm, dissection, vasculitis or significant stenosis.  Veins: No obvious venous abnormality within the limitations of this arterial phase study.  Review of the MIP images confirms the above findings.  NON-VASCULAR  Lower chest: Enlarging lobulated pulmonary nodule in the inferior aspect of the anterior left lower lobe now measures 1.8 x 1.0 cm compared to 0.5 mm previously. There is evidence of spiculation around the margins. Findings are highly concerning for primary bronchogenic carcinoma. The lungs are otherwise clear. The heart is within normal limits for size.  Hepatobiliary: No focal liver abnormality is seen. No gallstones, gallbladder wall thickening, or biliary dilatation.  Pancreas: Unremarkable. No pancreatic ductal dilatation or surrounding inflammatory changes.  Spleen: Normal in size without focal abnormality.  Adrenals/Urinary Tract: Stable left adrenal adenoma. The right adrenal gland is normal. No hydronephrosis, nephrolithiasis or enhancing renal mass. Stable left lower pole renal cyst. The ureters and bladder are unremarkable.  Stomach/Bowel: Colonic diverticular disease without  CT evidence of active inflammation. No focal bowel wall thickening or evidence of obstruction.  Lymphatic: No suspicious lymphadenopathy.  Reproductive: Status post hysterectomy. No adnexal masses.  Other: Multifocal fat containing ventral abdominal wall hernias. No evidence of ascites.  Musculoskeletal: No acute or significant osseous findings. Multilevel degenerative disc disease. Grade 1 anterolisthesis of L4 on L5.  IMPRESSION: VASCULAR  1. Enlarging fusiform aneurysmal dilation of the infrarenal abdominal aorta now with maximal dimensions of 5.9 x 5.7 cm compared to 5.1 x 5.0 cm in August of 2019. Recommend referral  to a vascular specialist. This recommendation follows ACR consensus guidelines: White Paper of the ACR Incidental Findings Committee II on Vascular Findings. J Am Coll Radiol 2013; 10:789-794. Aortic aneurysm NOS (ICD10-I71.9); Aortic Atherosclerosis (ICD10-I70.0). 2. Mild aneurysmal dilation of the left common iliac artery measuring up to 1.8 cm. 3. Extensive atherosclerotic calcifications as detailed above.  NON-VASCULAR  1. Enlarging left lower lobe lobular pulmonary nodule with peripheral spiculations highly concerning for primary bronchogenic carcinoma. Recommend PET-CT and referral to multidisciplinary thoracic oncology conference [facility]). 2. Additional ancillary findings as above without significant interval change.  These results will be called to the ordering clinician or representative by the Radiologist Assistant, and communication documented in the PACS or [employer].  Signed,  Criselda Peaches, MD, [place]  [facility]ventional Radiology Specialists  [facility] Radiology   Electronically Signed   By: Jacqulynn Cadet M.D.   On: 04/24/2020 12:54   MEDICAL ISSUES: 86 y.o. female with enlarging pararenal aortic aneurysm which is now at the threshold for intervention (69mm). Given her coronary artery  disease, advanced age, likely lung malignancy, and circumferential aortic calcification, I think an open reconstruction is not the best option for her.   On my review, the angulation of the neck appears too great to permit a fenestrated repair within a Zenith device's IFU.  Parallel stent grafting could be an option for her, but would be challenging given the location and angulation of the origins of the SMA / LRA / RRA.  I think it would be best if she saw Dr. Sammuel Hines to discuss advanced endovascular aortic reconstruction. Will refer her today.  New diagnosis of lung cancer in left lower lobe.  I think this currently presents the greatest risk to her life. I will refer her to oncology ASAP. She understands she may need to delay treatment of her AAA to treat her lung cancer.   Recommend best medical therapy including:  Complete cessation from Brooks tobacco products. Blood glucose control with goal A1c < 7%. Blood pressure control with goal blood pressure < 140/90 mmHg. Lipid reduction therapy with goal LDL-C <100 mg/dL (<70 if symptomatic from PAD).  Aspirin 81mg  PO QD.  Atorvastatin 40-80mg  PO QD (or other "high intensity" statin therapy).  Yevonne Aline. Stanford Breed, MD [facility] of [facility] Phone Number: [phone] 04/25/2020 11:54 AM

## 2020-04-26 ENCOUNTER — Telehealth: Payer: Self-pay | Admitting: *Deleted

## 2020-04-26 NOTE — Telephone Encounter (Signed)
I called Tammy Brooks today to see if she was contacted by [facility] and set up with med onc. She states not yet and would like to be seen there.  I told her I will contact [facility] and let them know.  She was thankful for the call.

## 2020-05-08 ENCOUNTER — Telehealth: Payer: Self-pay | Admitting: *Deleted

## 2020-05-08 NOTE — Telephone Encounter (Signed)
I called Tammy Brooks to see if she had an appt in [place].  She explained that no one had called her with an appt. I reached out to new patient scheduler to call and schedule patient.

## 2020-05-15 ENCOUNTER — Other Ambulatory Visit: Payer: Self-pay | Admitting: Cardiovascular Disease

## 2020-05-16 ENCOUNTER — Telehealth: Payer: Self-pay | Admitting: Hematology and Oncology

## 2020-05-16 NOTE — Telephone Encounter (Signed)
Patient referred by Dr Jamelle Haring for Abnormal CT Scan.  Appt made for 05/18/2020 Labs 10:30 am - Consult 11:00 am

## 2020-05-17 ENCOUNTER — Telehealth: Payer: Self-pay | Admitting: Hematology and Oncology

## 2020-05-17 NOTE — Telephone Encounter (Signed)
Patient called to Confirm 12/16 Appt's

## 2020-05-18 ENCOUNTER — Other Ambulatory Visit: Payer: Self-pay

## 2020-05-18 ENCOUNTER — Other Ambulatory Visit: Payer: Self-pay | Admitting: Hematology and Oncology

## 2020-05-18 ENCOUNTER — Inpatient Hospital Stay: Payer: Medicare Other | Attending: Hematology and Oncology | Admitting: Hematology and Oncology

## 2020-05-18 ENCOUNTER — Inpatient Hospital Stay: Payer: Medicare Other

## 2020-05-18 ENCOUNTER — Encounter: Payer: Self-pay | Admitting: Hematology and Oncology

## 2020-05-18 ENCOUNTER — Telehealth: Payer: Self-pay | Admitting: Oncology

## 2020-05-18 DIAGNOSIS — Z79899 Other long term (current) drug therapy: Secondary | ICD-10-CM | POA: Insufficient documentation

## 2020-05-18 DIAGNOSIS — D649 Anemia, unspecified: Secondary | ICD-10-CM | POA: Diagnosis not present

## 2020-05-18 DIAGNOSIS — Z85038 Personal history of other malignant neoplasm of large intestine: Secondary | ICD-10-CM

## 2020-05-18 DIAGNOSIS — R918 Other nonspecific abnormal finding of lung field: Secondary | ICD-10-CM

## 2020-05-18 DIAGNOSIS — Z0001 Encounter for general adult medical examination with abnormal findings: Secondary | ICD-10-CM | POA: Diagnosis not present

## 2020-05-18 DIAGNOSIS — F17210 Nicotine dependence, cigarettes, uncomplicated: Secondary | ICD-10-CM | POA: Diagnosis not present

## 2020-05-18 DIAGNOSIS — R911 Solitary pulmonary nodule: Secondary | ICD-10-CM | POA: Insufficient documentation

## 2020-05-18 DIAGNOSIS — Z90710 Acquired absence of both cervix and uterus: Secondary | ICD-10-CM | POA: Diagnosis not present

## 2020-05-18 DIAGNOSIS — I252 Old myocardial infarction: Secondary | ICD-10-CM | POA: Diagnosis not present

## 2020-05-18 DIAGNOSIS — Z7901 Long term (current) use of anticoagulants: Secondary | ICD-10-CM | POA: Diagnosis not present

## 2020-05-18 LAB — CBC AND DIFFERENTIAL
HCT: 42 (ref 36–46)
Hemoglobin: 13.6 (ref 12.0–16.0)
Neutrophils Absolute: 5.85
Platelets: 217 (ref 150–399)
WBC: 8.6

## 2020-05-18 LAB — HEPATIC FUNCTION PANEL
ALT: 14 (ref 7–35)
AST: 24 (ref 13–35)
Alkaline Phosphatase: 68 (ref 25–125)
Bilirubin, Total: 0.5

## 2020-05-18 LAB — COMPREHENSIVE METABOLIC PANEL
Albumin: 4 (ref 3.5–5.0)
Calcium: 9.6 (ref 8.7–10.7)

## 2020-05-18 LAB — BASIC METABOLIC PANEL
BUN: 15 (ref 4–21)
CO2: 26 — AB (ref 13–22)
Chloride: 108 (ref 99–108)
Creatinine: 1 (ref 0.5–1.1)
Glucose: 104
Potassium: 4.2 (ref 3.4–5.3)
Sodium: 142 (ref 137–147)

## 2020-05-18 LAB — PROTIME-INR: Protime: 10.9 (ref 10.0–13.8)

## 2020-05-18 LAB — CBC
MCV: 88 (ref 81–99)
RBC: 4.77 (ref 3.87–5.11)

## 2020-05-18 NOTE — Telephone Encounter (Signed)
Per 12/16 scheduled patient for PET Scan.  Waiting for Pre - Authorizaiton

## 2020-05-18 NOTE — Progress Notes (Addendum)
[facility]  [address] [place],  [place]  [address] [phone]  Clinic Day:  05/18/2020  Referring physician: Cherre Robins, MD   CHIEF COMPLAINT:  CC: Enlarging left lower lobe lobular pulmonary nodule with peripheral spiculations highly concerning for primary bronchogenic carcinoma.  Current Treatment:  Diagnostic   HISTORY OF PRESENT ILLNESS:  Tammy Brooks is a 86 y.o. female with a history of abdominal aortic aneurysm  who is referred in consultation with Dr. Jamelle Haring for assessment and management. She was most recently followed by Dr. Sherren Mocha Early and now with Dr. Stanford Breed. CT angiogram findings revealed interval growth of her aneurysm most likely requiring complicated repair. However, other CT findings revealed an enlarging lobulated pulmonary nodule in the inferior aspect of the anterior left lower lobe now measures 1.8 x 1.0 cm compared to 0.5 mm previously. There is evidence of spiculation around the margins. Findings are highly concerning for primary bronchogenic carcinoma. The patient is here today to discuss the implications of these findings. She denies chest pain, shortness of breath or cough. She denies issue with bowel or bladder. She denies fever, chills, nausea or vomiting. She is understandably anxious regarding these new findings and is agreeable to further diagnostic measures. We discussed the benefits of a PET scan and she understands this will be the next step. Her son is present during today's discussion as well. CBC and CMP are unremarkable.   REVIEW OF SYSTEMS:  Review of Systems  Constitutional: Negative for appetite change, chills, diaphoresis, fatigue, fever and unexpected weight change.  HENT:   Negative for hearing loss, lump/mass, mouth sores, nosebleeds, sore throat, tinnitus, trouble swallowing and voice change.   Eyes: Negative for eye problems and icterus.  Respiratory: Negative for chest tightness, cough,  hemoptysis, shortness of breath and wheezing.   Cardiovascular: Negative for chest pain, leg swelling and palpitations.  Gastrointestinal: Negative for abdominal distention, abdominal pain, blood in stool, constipation, diarrhea, nausea, rectal pain and vomiting.  Endocrine: Negative for hot flashes.  Genitourinary: Negative for bladder incontinence, difficulty urinating, dyspareunia, dysuria, frequency, hematuria and nocturia.   Musculoskeletal: Negative for arthralgias, back pain, flank pain, gait problem, myalgias, neck pain and neck stiffness.  Skin: Negative for itching, rash and wound.  Neurological: Negative for dizziness, extremity weakness, gait problem, headaches, light-headedness, numbness, seizures and speech difficulty.  Hematological: Negative for adenopathy. Does not bruise/bleed easily.  Psychiatric/Behavioral: Negative for confusion, decreased concentration, depression, sleep disturbance and suicidal ideas. The patient is not nervous/anxious.      VITALS:  There were no vitals taken for this visit.  Wt Readings from Last 3 Encounters:  05/18/20 129 lb 8 oz (58.7 kg)  04/25/20 127 lb (57.6 kg)  04/24/20 127 lb (57.6 kg)    There is no height or weight on file to calculate BMI.  Performance status (ECOG): 1 - Symptomatic but completely ambulatory  PHYSICAL EXAM:  Physical Exam Constitutional:      General: She is not in acute distress.    Appearance: Normal appearance. She is normal weight. She is not ill-appearing, toxic-appearing or diaphoretic.  HENT:     Head: Normocephalic and atraumatic.     Right Ear: Tympanic membrane normal.     Left Ear: Tympanic membrane normal.     Nose: Nose normal. No congestion or rhinorrhea.     Mouth/Throat:     Mouth: Mucous membranes are moist.     Pharynx: Oropharynx is clear. No oropharyngeal exudate or posterior  oropharyngeal erythema.  Eyes:     General: No scleral icterus.       Right eye: No discharge.        Left eye: No  discharge.     Extraocular Movements: Extraocular movements intact.     Conjunctiva/sclera: Conjunctivae normal.     Pupils: Pupils are equal, round, and reactive to light.  Neck:     Vascular: No carotid bruit.  Cardiovascular:     Rate and Rhythm: Normal rate and regular rhythm.     Heart sounds: No murmur heard. No friction rub. No gallop.   Pulmonary:     Effort: Pulmonary effort is normal. No respiratory distress.     Breath sounds: Normal breath sounds. No stridor. No wheezing, rhonchi or rales.  Chest:     Chest wall: No tenderness.  Abdominal:     General: Abdomen is flat. Bowel sounds are normal. There is no distension.     Palpations: There is no mass.     Tenderness: There is no abdominal tenderness. There is no right CVA tenderness, left CVA tenderness, guarding or rebound.     Hernia: No hernia is present.  Musculoskeletal:        General: No swelling, tenderness, deformity or signs of injury. Normal range of motion.     Cervical back: Normal range of motion and neck supple. No rigidity or tenderness.     Right lower leg: No edema.     Left lower leg: No edema.  Lymphadenopathy:     Cervical: No cervical adenopathy.  Skin:    General: Skin is warm and dry.     Capillary Refill: Capillary refill takes less than 2 seconds.     Coloration: Skin is not jaundiced or pale.     Findings: No bruising, erythema, lesion or rash.  Neurological:     General: No focal deficit present.     Mental Status: She is alert and oriented to person, place, and time. Mental status is at baseline.     Cranial Nerves: No cranial nerve deficit.     Sensory: No sensory deficit.     Motor: No weakness.     Coordination: Coordination normal.     Gait: Gait normal.     Deep Tendon Reflexes: Reflexes normal.  Psychiatric:        Mood and Affect: Mood normal.        Behavior: Behavior normal.        Thought Content: Thought content normal.        Judgment: Judgment normal.    Lymph nodes:    There is no cervical, clavicular, axillary or lymphadenopathy.  LABS:   CBC Latest Ref Rng & Units 05/18/2020 01/12/2020 01/06/2019  WBC - 8.6 8.9 7.5  Hemoglobin 12.0 - 16.0 13.6 13.6 14.0  Hematocrit 36 - 46 42 41.9 43.3  Platelets 150 - 399 217 204 207.0   CMP Latest Ref Rng & Units 05/18/2020 01/12/2020 07/12/2019  Glucose 65 - 99 mg/dL - 97 92  BUN 4 - 21 15 16 15   Creatinine 0.5 - 1.1 1.0 0.91(H) 1.04  Sodium 137 - 147 142 142 140  Potassium 3.4 - 5.3 4.2 4.5 4.0  Chloride 99 - 108 108 107 106  CO2 13 - 22 26(A) 28 27  Calcium 8.7 - 10.7 9.6 9.6 9.3  Total Protein 6.1 - 8.1 g/dL - 6.2 6.5  Total Bilirubin 0.2 - 1.2 mg/dL - 0.5 0.5  Alkaline Phos 25 - 125  68 - 60  AST 13 - 35 24 18 16   ALT 7 - 35 14 14 9      No results found for: CEA1 / No results found for: CEA1 No results found for: PSA1 No results found for: TKZ601 No results found for: CAN125  No results found for: TOTALPROTELP, ALBUMINELP, A1GS, A2GS, BETS, BETA2SER, GAMS, MSPIKE, SPEI No results found for: TIBC, FERRITIN, IRONPCTSAT No results found for: LDH  STUDIES:  VAS [place] AAA DUPLEX  Result Date: 04/24/2020 ABDOMINAL AORTA STUDY Indications: Follow up exam for known AAA.  Comparison Study: 08/11/19 Largest AAA diameter 5.12 cm Performing Technologist: June Leap RDMS, RVT  Examination Guidelines: A complete evaluation includes B-mode imaging, spectral Doppler, color Doppler, and power Doppler as needed of all accessible portions of each vessel. Bilateral testing is considered an integral part of a complete examination. Limited examinations for reoccurring indications may be performed as noted.  Abdominal Aorta Findings: +-----------+-------+----------+----------+--------+--------+--------+ Location   AP (cm)Trans (cm)PSV (cm/s)WaveformThrombusComments +-----------+-------+----------+----------+--------+--------+--------+ Proximal   2.00   2.16      81                                  +-----------+-------+----------+----------+--------+--------+--------+ Mid        5.46   5.55      28                                 +-----------+-------+----------+----------+--------+--------+--------+ Distal     5.08   5.46      29                                 +-----------+-------+----------+----------+--------+--------+--------+ RT CIA Prox1.6    1.4       138                                +-----------+-------+----------+----------+--------+--------+--------+ LT CIA Prox1.7    1.4       82                                 +-----------+-------+----------+----------+--------+--------+--------+  Summary: Abdominal Aorta: There is evidence of abnormal dilatation of the mid and distal Abdominal aorta. The largest aortic diameter has increased compared to prior exam. Previous diameter measurement was 5.1 cm obtained on 08/11/19.  *See table(s) above for measurements and observations.  Electronically signed by Monica Martinez MD on 04/24/2020 at 5:11:31 PM.   Final    CT ANGIO ABDOMEN PELVIS  W &/OR WO CONTRAST  Result Date: 04/24/2020 CLINICAL DATA:  Abdominal aortic aneurysm, surveillance EXAM: CTA ABDOMEN AND PELVIS WITHOUT AND WITH CONTRAST TECHNIQUE: Multidetector CT imaging of the abdomen and pelvis was performed using the standard protocol during bolus administration of intravenous contrast. Multiplanar reconstructed images and MIPs were obtained and reviewed to evaluate the vascular anatomy. CONTRAST:  80mL ISOVUE-370 IOPAMIDOL (ISOVUE-370) INJECTION 76% COMPARISON:  Prior CTA abdomen/pelvis 01/06/2018 FINDINGS: VASCULAR Aorta: Enlarging fusiform aneurysm of the highly tortuous abdominal aorta. Measurements are obtained on the coronal and reformatted images with maximal dimensions of 5.7 by 5.9 cm compared to 5.1 by 5.0 cm previously. Extensive wall adherent mural thrombus throughout the aneurysmal portion of the aorta. Extensive atherosclerotic calcifications  are also  again noted. Celiac: Calcified atherosclerotic plaque at the origin results in mild stenosis. SMA: Calcified atherosclerotic plaque at the origin results in mild stenosis. Renals: Both renal arteries are patent without evidence of aneurysm, dissection, vasculitis, fibromuscular dysplasia or significant stenosis. IMA: Occluded at the origin. The vessel opacifies distally from collateral flow. Inflow: Calcified plaque results in moderate stenosis at the origin of the right common iliac artery. Mild aneurysmal dilation of the left common iliac artery measuring up to 1.8 cm. Both internal iliac arteries are patent. The external iliac arteries are diseased but without dissection, aneurysm or significant stenosis. Proximal Outflow: Bilateral common femoral and visualized portions of the superficial and profunda femoral arteries are patent without evidence of aneurysm, dissection, vasculitis or significant stenosis. Veins: No obvious venous abnormality within the limitations of this arterial phase study. Review of the MIP images confirms the above findings. NON-VASCULAR Lower chest: Enlarging lobulated pulmonary nodule in the inferior aspect of the anterior left lower lobe now measures 1.8 x 1.0 cm compared to 0.5 mm previously. There is evidence of spiculation around the margins. Findings are highly concerning for primary bronchogenic carcinoma. The lungs are otherwise clear. The heart is within normal limits for size. Hepatobiliary: No focal liver abnormality is seen. No gallstones, gallbladder wall thickening, or biliary dilatation. Pancreas: Unremarkable. No pancreatic ductal dilatation or surrounding inflammatory changes. Spleen: Normal in size without focal abnormality. Adrenals/Urinary Tract: Stable left adrenal adenoma. The right adrenal gland is normal. No hydronephrosis, nephrolithiasis or enhancing renal mass. Stable left lower pole renal cyst. The ureters and bladder are unremarkable. Stomach/Bowel: Colonic  diverticular disease without CT evidence of active inflammation. No focal bowel wall thickening or evidence of obstruction. Lymphatic: No suspicious lymphadenopathy. Reproductive: Status post hysterectomy. No adnexal masses. Other: Multifocal fat containing ventral abdominal wall hernias. No evidence of ascites. Musculoskeletal: No acute or significant osseous findings. Multilevel degenerative disc disease. Grade 1 anterolisthesis of L4 on L5. IMPRESSION: VASCULAR 1. Enlarging fusiform aneurysmal dilation of the infrarenal abdominal aorta now with maximal dimensions of 5.9 x 5.7 cm compared to 5.1 x 5.0 cm in August of 2019. Recommend referral to a vascular specialist. This recommendation follows ACR consensus guidelines: White Paper of the ACR Incidental Findings Committee II on Vascular Findings. J Am Coll Radiol 2013; 10:789-794. Aortic aneurysm NOS (ICD10-I71.9); Aortic Atherosclerosis (ICD10-I70.0). 2. Mild aneurysmal dilation of the left common iliac artery measuring up to 1.8 cm. 3. Extensive atherosclerotic calcifications as detailed above. NON-VASCULAR 1. Enlarging left lower lobe lobular pulmonary nodule with peripheral spiculations highly concerning for primary bronchogenic carcinoma. Recommend PET-CT and referral to multidisciplinary thoracic oncology conference [facility]). 2. Additional ancillary findings as above without significant interval change. These results will be called to the ordering clinician or representative by the Radiologist Assistant, and communication documented in the PACS or [employer]. Signed, Criselda Peaches, MD, [facility] Electronically Signed   By: Jacqulynn Cadet M.D.   On: 04/24/2020 12:54      HISTORY:   Past Medical History:  Diagnosis Date   Abdominal aortic aneurysm [facility])    followed by Dr. Sherren Mocha Early   Arthritis    Blind left eye    CAD (coronary artery disease) 08/1995   50-55% second  diag dz.; occluded OM   Cataract    Colon cancer ([place])    Complication of anesthesia    "Blood pressure went down" with retinal surgery - no problems with previous surgeries  Echocardiogram abnormal 03/2012   mild to mod AS, valve area1.58 cm2, mild concentric LVH, grade I diastolic dusfunction   Gout    Heart murmur    Hyperlipidemia    Hypertension    MI (myocardial infarction) ([place]) 01/25/2018   Myocardial infarction [facility])    mild/ age 8   Osteoporosis    PAD (peripheral artery disease) ([place])    mild to mod Rt ICA stenosis followed by doppler-02/05/12   Pelvic mass    Stroke [facility]) 2001   no residual problems   Thyroid disease    Tobacco abuse    Ulcer     Past Surgical History:  Procedure Laterality Date   ABDOMINAL HYSTERECTOMY     APPENDECTOMY     CARDIAC CATHETERIZATION  08/1995   50-55% second diag lesion, occluded OM   CATARACT EXTRACTION W/ INTRAOCULAR LENS  IMPLANT, BILATERAL     COLONOSCOPY     EYE SURGERY     lense implant removed/ blind in left eye   LAPAROTOMY N/A 04/26/2014   Procedure: EXPLORATORY LAPAROTOMY/RESECTION OF PELVIC MASS;  Surgeon: Alvino Chapel, MD;  Location: [facility] ORS;  Service: Gynecology;  Laterality: N/A;   OOPHORECTOMY     and fallopian tube   POLYPECTOMY     RETINAL DETACHMENT SURGERY  2006   TONSILLECTOMY AND ADENOIDECTOMY     TUBAL LIGATION     UPPER GASTROINTESTINAL ENDOSCOPY     treated for h-pylori    Family History  Problem Relation Age of Onset   Aneurysm Mother        AAA   Hypertension Mother    Hyperlipidemia Mother    Heart disease Father        CAD, MI x 3   Heart attack Father    Cancer Maternal Aunt        breast cancer   Diabetes Sister    Hyperlipidemia Sister    Hypertension Sister    Colon cancer Neg Hx    Stomach cancer Neg Hx     Social History:  reports that she has been smoking cigarettes. She has a 31.00 pack-year smoking history. She has never used smokeless tobacco. She reports current  alcohol use. She reports that she does not use drugs.The patient is accompanied by her son today.  Allergies:  Allergies  Allergen Reactions   Nabumetone Anaphylaxis, Swelling and Other (See Comments)    Causes inflammation of airways   Tape Other (See Comments)    SKIN IS VERY THIN AND TEARS EASILY- PLEASE USE AN ALTERNATIVE TO TAPE, IF POSSIBLE!!    Current Medications: Current Outpatient Medications  Medication Sig Dispense Refill   allopurinol (ZYLOPRIM) 100 MG tablet TAKE 1 TABLET BY MOUTH  DAILY 90 tablet 3   amLODipine (NORVASC) 5 MG tablet TAKE 1 TABLET BY MOUTH  DAILY 90 tablet 3   atorvastatin (LIPITOR) 40 MG tablet TAKE 2 TABLETS BY MOUTH  DAILY 180 tablet 3   b complex vitamins tablet Take 1 tablet by mouth daily. 100 tablet 3   Cholecalciferol (VITAMIN D3) 1.25 MG (50000 UT) CAPS Take 1 capsule by mouth once a week. (Patient not taking: Reported on 04/25/2020) 9 capsule 0   Cholecalciferol (VITAMIN D3) 50 MCG (2000 UT) capsule Take 1 capsule (2,000 Units total) by mouth daily. 100 capsule 3   clopidogrel (PLAVIX) 75 MG tablet TAKE 1 TABLET BY MOUTH  DAILY 90 tablet 3   furosemide (LASIX) 20 MG tablet TAKE 1-2 TABLETS BY MOUTH  DAILY  AS NEEDED FOR EDEMA. (Patient taking differently: Take 20-40 mg by mouth daily as needed for edema. ) 180 tablet 1   isosorbide mononitrate (IMDUR) 30 MG 24 hr tablet TAKE 1 TABLET BY MOUTH  DAILY 90 tablet 3   levothyroxine (SYNTHROID) 50 MCG tablet TAKE 1 TABLET BY MOUTH  DAILY 90 tablet 2   losartan (COZAAR) 25 MG tablet TAKE 1 TABLET BY MOUTH  DAILY 90 tablet 3   metoprolol tartrate (LOPRESSOR) 25 MG tablet TAKE ONE-HALF TABLET BY  MOUTH TWICE DAILY 90 tablet 3   Multiple Vitamins-Minerals (PRESERVISION/LUTEIN) CAPS Take 1 capsule by mouth daily with breakfast.     naproxen sodium (ALEVE) 220 MG tablet Take 440 mg by mouth 2 (two) times daily as needed (for back pain).      nitroGLYCERIN (NITROSTAT) 0.4 MG SL tablet Place 0.4 mg under the  tongue every 5 (five) minutes as needed for chest pain.     tiZANidine (ZANAFLEX) 4 MG tablet Take 1 tablet (4 mg total) by mouth every 8 (eight) hours as needed for muscle spasms. 90 tablet 2   traMADol (ULTRAM) 50 MG tablet Take 0.5-1 tablets (25-50 mg total) by mouth every 8 (eight) hours as needed. 100 tablet 2   No current facility-administered medications for this visit.     ASSESSMENT & PLAN:   Assessment:  SHIRLA HODGKISS is a 86 y.o. female with a history of AAA now with new findings of a 1.8 x 1.0 left lower lung nodule increased in size from 0.5 mm. The patient is a current every day smoker and we had a long discussion regarding the concern for malignancy. We reviewed the CT findings and discussed that if PET reveals positive results, a tissue biopsy will be the next step for definitive diagnosis. Both she and her son are agreeable to this plan.   Plan: 1.  We will plan for PET scan imaging and follow up in the office once imaging has been obtained.   I discussed the assessment and treatment plan with the patient.  The patient was provided an opportunity to ask questions and all were answered.  The patient agreed with the plan and demonstrated an understanding of the instructions.  The patient was advised to call back if the symptoms worsen or if the condition fails to improve as anticipated.  Thank you for the opportunity    I provided 30 minutes of face-to-face time during this this encounter and > 50% was spent counseling as documented under my assessment and plan.    Melodye Ped, NP

## 2020-05-19 LAB — CEA: CEA: 2.5 ng/mL (ref 0.0–4.7)

## 2020-05-21 ENCOUNTER — Telehealth: Payer: Self-pay | Admitting: Pulmonary Disease

## 2020-05-21 NOTE — Telephone Encounter (Signed)
-----   Message from Garner Nash, DO sent at 05/19/2020  5:37 PM EST ----- Regarding: Lung nodule Slot... week of 27th  Patrice,   Please call patient and place on my schedule for the week of the 27th in a lung nodule slot hopefully after the PET scan has been complete.  Thanks  Fuller Canada, DO [facility] 05/19/2020 5:40 PM       ----- Message ----- From: Cherre Robins, MD Sent: 05/19/2020   7:28 AM EST To: Garner Nash, DO  This is the lady I was talking to you about. Enlarging LLL ?bronchogenic ca. Thanks for all your help!  Gershon Mussel [phone]

## 2020-05-21 NOTE — Telephone Encounter (Signed)
Called and left message on pt vm to call back to schedule appt with Dr. Valeta Harms - week of 05/29/2020 in a lung nodule slot -pr

## 2020-05-29 NOTE — Telephone Encounter (Signed)
Pt scheduled to see Dr. Valeta Harms on 12.30.21. Will keep message open to assure appt is kept.

## 2020-06-01 ENCOUNTER — Other Ambulatory Visit: Payer: Self-pay

## 2020-06-01 ENCOUNTER — Encounter: Payer: Self-pay | Admitting: Pulmonary Disease

## 2020-06-01 ENCOUNTER — Ambulatory Visit: Payer: Medicare Other | Admitting: Pulmonary Disease

## 2020-06-01 VITALS — BP 120/70 | HR 60 | Temp 97.0°F | Ht 63.0 in | Wt 129.0 lb

## 2020-06-01 DIAGNOSIS — F17200 Nicotine dependence, unspecified, uncomplicated: Secondary | ICD-10-CM | POA: Diagnosis not present

## 2020-06-01 DIAGNOSIS — I350 Nonrheumatic aortic (valve) stenosis: Secondary | ICD-10-CM | POA: Diagnosis not present

## 2020-06-01 DIAGNOSIS — I252 Old myocardial infarction: Secondary | ICD-10-CM

## 2020-06-01 DIAGNOSIS — I2510 Atherosclerotic heart disease of native coronary artery without angina pectoris: Secondary | ICD-10-CM | POA: Diagnosis not present

## 2020-06-01 DIAGNOSIS — Z9861 Coronary angioplasty status: Secondary | ICD-10-CM

## 2020-06-01 DIAGNOSIS — R911 Solitary pulmonary nodule: Secondary | ICD-10-CM

## 2020-06-01 DIAGNOSIS — Z7902 Long term (current) use of antithrombotics/antiplatelets: Secondary | ICD-10-CM

## 2020-06-01 NOTE — Progress Notes (Signed)
Synopsis: Referred in December 2021 for abnormal CT chest by Plotnikov, Evie Lacks, MD  Subjective:   PATIENT ID: Tammy Brooks GENDER: female DOB: Feb 17, 1934, MRN: [id number]  Chief Complaint  Patient presents with  . Consult    Pt has not had her PET scan.  Lung nodule     A 86 year old female abdominal aortic aneurysm, coronary artery disease, aortic stenosis, hyperlipidemia, hypertension history of stroke history of MI history of peripheral arterial disease and tobacco abuse.  Patient had a CTA by vascular surgery which revealed a anterior left upper lobe lobulated pulmonary nodule of 1.8 x 1 cm compared to 0.5 mm on previous imaging concerning now for a primary bronchogenic carcinoma.  Patient was evaluated by oncology and a PET scan was ordered.  Patient was referred here for evaluation by vascular surgery.  She is a current smoker for the past 60+ years currently at about 1/2 pack/day.  06/01/2020 OV: Overall from a respiratory standpoint patient doing well.  She is on Plavix, history of stents placed greater than 2 years ago.  Cardiologist is Dr. Gwenlyn Found.  Sees Dr. Stanford Breed from vascular surgery.  Unfortunately she is still smoking.  She has been put to sleep before in the past for EGD.  She has not had imaging of the chest recently.  This was caught on the abdominal CT.  Patient denies fever chills weight loss or night sweats.   Past Medical History:  Diagnosis Date  . Abdominal aortic aneurysm ([place])    followed by Dr. Sherren Mocha Early  . Arthritis   . Blind left eye   . CAD (coronary artery disease) 08/1995   50-55% second diag dz.; occluded OM  . Cataract   . Colon cancer ([place])   . Complication of anesthesia    "Blood pressure went down" with retinal surgery - no problems with previous surgeries  . Echocardiogram abnormal 03/2012   mild to mod AS, valve area1.58 cm2, mild concentric LVH, grade I diastolic dusfunction  . Gout   . Heart murmur   . Hyperlipidemia   . Hypertension   .  MI (myocardial infarction) ([place]) 01/25/2018  . Myocardial infarction [facility])    mild/ age 67  . Osteoporosis   . PAD (peripheral artery disease) ([facility])    mild to mod Rt ICA stenosis followed by doppler-02/05/12  . Pelvic mass   . Stroke [facility]) 2001   no residual problems  . Thyroid disease   . Tobacco abuse   . Ulcer      Family History  Problem Relation Age of Onset  . Aneurysm Mother        AAA  . Hypertension Mother   . Hyperlipidemia Mother   . Heart disease Father        CAD, MI x 3  . Heart attack Father   . Cancer Maternal Aunt        breast cancer  . Diabetes Sister   . Hyperlipidemia Sister   . Hypertension Sister   . Colon cancer Neg Hx   . Stomach cancer Neg Hx      Past Surgical History:  Procedure Laterality Date  . ABDOMINAL HYSTERECTOMY    . APPENDECTOMY    . CARDIAC CATHETERIZATION  08/1995   50-55% second diag lesion, occluded OM  . CATARACT EXTRACTION W/ INTRAOCULAR LENS  IMPLANT, BILATERAL    . COLONOSCOPY    . EYE SURGERY     lense implant removed/ blind in left eye  . LAPAROTOMY  N/A 04/26/2014   Procedure: EXPLORATORY LAPAROTOMY/RESECTION OF PELVIC MASS;  Surgeon: Alvino Chapel, MD;  Location: [facility];  Service: Gynecology;  Laterality: N/A;  . OOPHORECTOMY     and fallopian tube  . POLYPECTOMY    . RETINAL DETACHMENT SURGERY  2006  . TONSILLECTOMY AND ADENOIDECTOMY    . TUBAL LIGATION    . UPPER GASTROINTESTINAL ENDOSCOPY     treated for h-pylori    Social History   Socioeconomic History  . Marital status: Widowed    Spouse name: Not on file  . Number of children: 5  . Years of education: 9  . Highest education level: 9th grade  Occupational History  . Occupation: retired    Comment: retired  Tobacco Use  . Smoking status: Current Every Day Smoker    Packs/day: 0.50    Years: 62.00    Pack years: 31.00    Types: Cigarettes  . Smokeless tobacco: Never Used  Vaping Use  . Vaping Use: Never used  Substance and Sexual  Activity  . Alcohol use: Yes    Alcohol/week: 0.0 standard drinks    Comment: occasionally - few drinks/month  . Drug use: No  . Sexual activity: Never    Partners: Male    Birth control/protection: None, Surgical  Other Topics Concern  . Not on file  Social History Narrative   9th grade. married '54- widowed Dec 10th'08. 4 sons - '55, '58, '60, '69; 1 daughter - '63; 12 grandchildren; 9 great-grandchildren. 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 Other children are close and attentive. ACP/End-of-life: No CPR, no mechanical ventilation, no futile/heroic measures.   Social Determinants of Health   Financial Resource Strain: Not on file  Food Insecurity: Not on file  Transportation Needs: Not on file  Physical Activity: Not on file  Stress: Not on file  Social Connections: Not on file  Intimate Partner Violence: Not on file     Allergies  Allergen Reactions  . Nabumetone Anaphylaxis, Swelling and Other (See Comments)    Causes inflammation of airways  . Tape Other (See Comments)    SKIN IS VERY THIN AND TEARS EASILY- PLEASE USE AN ALTERNATIVE TO TAPE, IF POSSIBLE!!     Outpatient Medications Prior to Visit  Medication Sig Dispense Refill  . allopurinol (ZYLOPRIM) 100 MG tablet TAKE 1 TABLET BY MOUTH  DAILY 90 tablet 3  . amLODipine (NORVASC) 5 MG tablet TAKE 1 TABLET BY MOUTH  DAILY 90 tablet 3  . atorvastatin (LIPITOR) 40 MG tablet TAKE 2 TABLETS BY MOUTH  DAILY 180 tablet 3  . Cholecalciferol (VITAMIN D3) 1.25 MG (50000 UT) CAPS Take 1 capsule by mouth once a week. 9 capsule 0  . clopidogrel (PLAVIX) 75 MG tablet TAKE 1 TABLET BY MOUTH  DAILY 90 tablet 3  . furosemide (LASIX) 20 MG tablet TAKE 1-2 TABLETS BY MOUTH  DAILY AS NEEDED FOR EDEMA. (Patient taking differently: Take 20-40 mg by mouth daily as needed for edema.) 180 tablet 1  . isosorbide mononitrate (IMDUR) 30 MG 24 hr tablet TAKE 1 TABLET BY MOUTH  DAILY 90 tablet 3  .  levothyroxine (SYNTHROID) 50 MCG tablet TAKE 1 TABLET BY MOUTH  DAILY 90 tablet 2  . losartan (COZAAR) 25 MG tablet TAKE 1 TABLET BY MOUTH  DAILY 90 tablet 3  . metoprolol tartrate (LOPRESSOR) 25 MG tablet TAKE ONE-HALF TABLET BY  MOUTH TWICE DAILY 90 tablet 3  .  Multiple Vitamins-Minerals (PRESERVISION/LUTEIN) CAPS Take 1 capsule by mouth daily with breakfast.    . naproxen sodium (ALEVE) 220 MG tablet Take 440 mg by mouth 2 (two) times daily as needed (for back pain).     . nitroGLYCERIN (NITROSTAT) 0.4 MG SL tablet Place 0.4 mg under the tongue every 5 (five) minutes as needed for chest pain.    Marland Kitchen tiZANidine (ZANAFLEX) 4 MG tablet Take 1 tablet (4 mg total) by mouth every 8 (eight) hours as needed for muscle spasms. 90 tablet 2  . traMADol (ULTRAM) 50 MG tablet Take 0.5-1 tablets (25-50 mg total) by mouth every 8 (eight) hours as needed. 100 tablet 2  . b complex vitamins tablet Take 1 tablet by mouth daily. (Patient not taking: Reported on 06/01/2020) 100 tablet 3  . Cholecalciferol (VITAMIN D3) 50 MCG (2000 UT) capsule Take 1 capsule (2,000 Units total) by mouth daily. (Patient not taking: Reported on 06/01/2020) 100 capsule 3   No facility-administered medications prior to visit.    Review of Systems  Constitutional: Negative for chills, fever, malaise/fatigue and weight loss.  HENT: Negative for hearing loss, sore throat and tinnitus.   Eyes: Negative for blurred vision and double vision.  Respiratory: Positive for cough. Negative for hemoptysis, sputum production, shortness of breath, wheezing and stridor.   Cardiovascular: Negative for chest pain, palpitations, orthopnea, leg swelling and PND.  Gastrointestinal: Negative for abdominal pain, constipation, diarrhea, heartburn, nausea and vomiting.  Genitourinary: Negative for dysuria, hematuria and urgency.  Musculoskeletal: Negative for joint pain and myalgias.  Skin: Negative for itching and rash.  Neurological: Negative for  dizziness, tingling, weakness and headaches.  Endo/Heme/Allergies: Negative for environmental allergies. Does not bruise/bleed easily.  Psychiatric/Behavioral: Negative for depression. The patient is not nervous/anxious and does not have insomnia.   All other systems reviewed and are negative.    Objective:  Physical Exam Vitals reviewed.  Constitutional:      General: She is not in acute distress.    Appearance: She is well-developed.  HENT:     Head: Normocephalic and atraumatic.     Mouth/Throat:     Mouth: Oropharynx is clear and moist.     Pharynx: No oropharyngeal exudate.  Eyes:     Extraocular Movements: EOM normal.     Conjunctiva/sclera: Conjunctivae normal.     Pupils: Pupils are equal, round, and reactive to light.  Neck:     Vascular: No JVD.     Trachea: No tracheal deviation.     Comments: Loss of supraclavicular fat Cardiovascular:     Rate and Rhythm: Normal rate and regular rhythm.     Pulses: Intact distal pulses.     Heart sounds: S1 normal and S2 normal.     Comments: Distant heart tones Pulmonary:     Effort: No tachypnea or accessory muscle usage.     Breath sounds: No stridor. Decreased breath sounds (throughout all lung fields) present. No wheezing, rhonchi or rales.     Comments: Increased AP chest diameter Abdominal:     General: Bowel sounds are normal. There is no distension.     Palpations: Abdomen is soft.     Tenderness: There is no abdominal tenderness.  Musculoskeletal:        General: Deformity (muscle wasting ) present. No edema.  Skin:    General: Skin is warm and dry.     Capillary Refill: Capillary refill takes less than 2 seconds.     Findings: No rash.  Neurological:  Mental Status: She is alert and oriented to person, place, and time.  Psychiatric:        Mood and Affect: Mood and affect normal.        Behavior: Behavior normal.      Vitals:   06/01/20 1044  BP: 120/70  Pulse: 60  Temp: (!) 97 F (36.1 C)   TempSrc: Tympanic  SpO2: 96%  Weight: 129 lb (58.5 kg)  Height: 5\' 3"  (1.6 m)   96% on RA BMI Readings from Last 3 Encounters:  06/01/20 22.85 kg/m  05/18/20 22.94 kg/m  04/25/20 22.50 kg/m   Wt Readings from Last 3 Encounters:  06/01/20 129 lb (58.5 kg)  05/18/20 129 lb 8 oz (58.7 kg)  04/25/20 127 lb (57.6 kg)     CBC    Component Value Date/Time   WBC 8.6 05/18/2020 0000   WBC 8.9 01/12/2020 1130   RBC 4.77 05/18/2020 0000   HGB 13.6 05/18/2020 0000   HCT 42 05/18/2020 0000   PLT 217 05/18/2020 0000   MCV 88 05/18/2020 0000   MCH 28.0 01/12/2020 1130   MCHC 32.5 01/12/2020 1130   RDW 12.8 01/12/2020 1130   LYMPHSABS 1,994 01/12/2020 1130   MONOABS 0.4 01/06/2019 1135   EOSABS 125 01/12/2020 1130   BASOSABS 62 01/12/2020 1130     Chest Imaging: CT abdomen: As a large infrarenal abdominal aortic aneurysm.  Additionally has a enlarging left lower lobe lobular pulmonary nodule concerning for a primary bronchogenic carcinoma.  In comparison to previous imaging that was completed in 2019 which shows a small nodule at the time has significantly enlarged. The patient's images have been independently reviewed by me.    Pulmonary Functions Testing Results: No flowsheet data found.  FeNO: no  Pathology: no  Echocardiogram: no  Heart Catheterization: no    Assessment & Plan:     ICD-10-CM   1. Lung nodule  R91.1 CT Super D Chest Wo Contrast    NM PET Image Initial (PI) Skull Base To Thigh    Ambulatory referral to Pulmonology  2. Current smoker  F17.200   3. Severe aortic stenosis  I35.0   4. History of ST elevation myocardial infarction (STEMI)  I25.2   5. CAD S/P percutaneous coronary angioplasty  I25.10    Z98.61   6. Long term current use of antithrombotics/antiplatelets  Z30.02     Discussion:  86 year old female with longstanding history of coronary artery disease, peripheral vascular disease, now with abdominal aortic aneurysm followed by  vascular surgery.  During this work-up she had a lung nodule found within the left anterior lower lobe measuring 1.8 x 1.0 cm compared to a 5 mm lesion noticed in 2019.  Subsequent follow-up suggest that has enlarged and therefore concerning for primary bronchogenic carcinoma.  Plan: She will need to hold her Plavix prior to the procedure. This has been discussed with the patient today and I have sent a message to vascular surgery, primary care as well as her cardiologist. We discussed the risk benefits and alternatives of proceeding with tissue biopsy to include video bronchoscopy with endobronchial navigation and likely fiducial placement. With her ongoing medical comorbidities I do not believe that she be a good candidate for a lobectomy. We also discussed this in detail today and the patient is not interested in undergoing a significant procedure like this at the age of 56.  I agree with her decision regarding this. Therefore we can plan for hopeful tissue biopsy plus fiducial  placement for consideration of SBRT.  Patient will be tentatively scheduled for 06/13/2020 Patient to be n.p.o. at midnight the night before  A PET scan and super D CT have been ordered.    Current Outpatient Medications:  .  allopurinol (ZYLOPRIM) 100 MG tablet, TAKE 1 TABLET BY MOUTH  DAILY, Disp: 90 tablet, Rfl: 3 .  amLODipine (NORVASC) 5 MG tablet, TAKE 1 TABLET BY MOUTH  DAILY, Disp: 90 tablet, Rfl: 3 .  atorvastatin (LIPITOR) 40 MG tablet, TAKE 2 TABLETS BY MOUTH  DAILY, Disp: 180 tablet, Rfl: 3 .  Cholecalciferol (VITAMIN D3) 1.25 MG (50000 UT) CAPS, Take 1 capsule by mouth once a week., Disp: 9 capsule, Rfl: 0 .  clopidogrel (PLAVIX) 75 MG tablet, TAKE 1 TABLET BY MOUTH  DAILY, Disp: 90 tablet, Rfl: 3 .  furosemide (LASIX) 20 MG tablet, TAKE 1-2 TABLETS BY MOUTH  DAILY AS NEEDED FOR EDEMA. (Patient taking differently: Take 20-40 mg by mouth daily as needed for edema.), Disp: 180 tablet, Rfl: 1 .  isosorbide  mononitrate (IMDUR) 30 MG 24 hr tablet, TAKE 1 TABLET BY MOUTH  DAILY, Disp: 90 tablet, Rfl: 3 .  levothyroxine (SYNTHROID) 50 MCG tablet, TAKE 1 TABLET BY MOUTH  DAILY, Disp: 90 tablet, Rfl: 2 .  losartan (COZAAR) 25 MG tablet, TAKE 1 TABLET BY MOUTH  DAILY, Disp: 90 tablet, Rfl: 3 .  metoprolol tartrate (LOPRESSOR) 25 MG tablet, TAKE ONE-HALF TABLET BY  MOUTH TWICE DAILY, Disp: 90 tablet, Rfl: 3 .  Multiple Vitamins-Minerals (PRESERVISION/LUTEIN) CAPS, Take 1 capsule by mouth daily with breakfast., Disp: , Rfl:  .  naproxen sodium (ALEVE) 220 MG tablet, Take 440 mg by mouth 2 (two) times daily as needed (for back pain). , Disp: , Rfl:  .  nitroGLYCERIN (NITROSTAT) 0.4 MG SL tablet, Place 0.4 mg under the tongue every 5 (five) minutes as needed for chest pain., Disp: , Rfl:  .  tiZANidine (ZANAFLEX) 4 MG tablet, Take 1 tablet (4 mg total) by mouth every 8 (eight) hours as needed for muscle spasms., Disp: 90 tablet, Rfl: 2 .  traMADol (ULTRAM) 50 MG tablet, Take 0.5-1 tablets (25-50 mg total) by mouth every 8 (eight) hours as needed., Disp: 100 tablet, Rfl: 2 .  b complex vitamins tablet, Take 1 tablet by mouth daily. (Patient not taking: Reported on 06/01/2020), Disp: 100 tablet, Rfl: 3 .  Cholecalciferol (VITAMIN D3) 50 MCG (2000 UT) capsule, Take 1 capsule (2,000 Units total) by mouth daily. (Patient not taking: Reported on 06/01/2020), Disp: 100 capsule, Rfl: 3  I spent 62 minutes dedicated to the care of this patient on the date of this encounter to include pre-visit review of records, face-to-face time with the patient discussing conditions above, post visit ordering of testing, clinical documentation with the electronic health record, making appropriate referrals as documented, and communicating necessary findings to members of the patients care team.   Garner Nash, [place] [facility] 06/01/2020 11:14 AM

## 2020-06-01 NOTE — H&P (View-Only) (Signed)
Synopsis: Referred in December 2021 for abnormal CT chest by Plotnikov, Evie Lacks, MD  Subjective:   PATIENT ID: Tammy Brooks GENDER: female DOB: 1933-10-29, MRN: [id number]  Chief Complaint  Patient presents with  . Consult    Pt has not had her PET scan.  Lung nodule     A 86 year old female abdominal aortic aneurysm, coronary artery disease, aortic stenosis, hyperlipidemia, hypertension history of stroke history of MI history of peripheral arterial disease and tobacco abuse.  Patient had a CTA by vascular surgery which revealed a anterior left upper lobe lobulated pulmonary nodule of 1.8 x 1 cm compared to 0.5 mm on previous imaging concerning now for a primary bronchogenic carcinoma.  Patient was evaluated by oncology and a PET scan was ordered.  Patient was referred here for evaluation by vascular surgery.  She is a current smoker for the past 60+ years currently at about 1/2 pack/day.  06/01/2020 OV: Overall from a respiratory standpoint patient doing well.  She is on Plavix, history of stents placed greater than 2 years ago.  Cardiologist is Dr. Gwenlyn Found.  Sees Dr. Stanford Breed from vascular surgery.  Unfortunately she is still smoking.  She has been put to sleep before in the past for EGD.  She has not had imaging of the chest recently.  This was caught on the abdominal CT.  Patient denies fever chills weight loss or night sweats.   Past Medical History:  Diagnosis Date  . Abdominal aortic aneurysm ([place])    followed by Dr. Sherren Mocha Early  . Arthritis   . Blind left eye   . CAD (coronary artery disease) 08/1995   50-55% second diag dz.; occluded OM  . Cataract   . Colon cancer ([place])   . Complication of anesthesia    "Blood pressure went down" with retinal surgery - no problems with previous surgeries  . Echocardiogram abnormal 03/2012   mild to mod AS, valve area1.58 cm2, mild concentric LVH, grade I diastolic dusfunction  . Gout   . Heart murmur   . Hyperlipidemia   . Hypertension   .  MI (myocardial infarction) ([place]) 01/25/2018  . Myocardial infarction [facility])    mild/ age 83  . Osteoporosis   . PAD (peripheral artery disease) ([facility])    mild to mod Rt ICA stenosis followed by doppler-02/05/12  . Pelvic mass   . Stroke [facility]) 2001   no residual problems  . Thyroid disease   . Tobacco abuse   . Ulcer      Family History  Problem Relation Age of Onset  . Aneurysm Mother        AAA  . Hypertension Mother   . Hyperlipidemia Mother   . Heart disease Father        CAD, MI x 3  . Heart attack Father   . Cancer Maternal Aunt        breast cancer  . Diabetes Sister   . Hyperlipidemia Sister   . Hypertension Sister   . Colon cancer Neg Hx   . Stomach cancer Neg Hx      Past Surgical History:  Procedure Laterality Date  . ABDOMINAL HYSTERECTOMY    . APPENDECTOMY    . CARDIAC CATHETERIZATION  08/1995   50-55% second diag lesion, occluded OM  . CATARACT EXTRACTION W/ INTRAOCULAR LENS  IMPLANT, BILATERAL    . COLONOSCOPY    . EYE SURGERY     lense implant removed/ blind in left eye  . LAPAROTOMY  N/A 04/26/2014   Procedure: EXPLORATORY LAPAROTOMY/RESECTION OF PELVIC MASS;  Surgeon: Alvino Chapel, MD;  Location: [facility];  Service: Gynecology;  Laterality: N/A;  . OOPHORECTOMY     and fallopian tube  . POLYPECTOMY    . RETINAL DETACHMENT SURGERY  2006  . TONSILLECTOMY AND ADENOIDECTOMY    . TUBAL LIGATION    . UPPER GASTROINTESTINAL ENDOSCOPY     treated for h-pylori    Social History   Socioeconomic History  . Marital status: Widowed    Spouse name: Not on file  . Number of children: 5  . Years of education: 9  . Highest education level: 9th grade  Occupational History  . Occupation: retired    Comment: retired  Tobacco Use  . Smoking status: Current Every Day Smoker    Packs/day: 0.50    Years: 62.00    Pack years: 31.00    Types: Cigarettes  . Smokeless tobacco: Never Used  Vaping Use  . Vaping Use: Never used  Substance and Sexual  Activity  . Alcohol use: Yes    Alcohol/week: 0.0 standard drinks    Comment: occasionally - few drinks/month  . Drug use: No  . Sexual activity: Never    Partners: Male    Birth control/protection: None, Surgical  Other Topics Concern  . Not on file  Social History Narrative   9th grade. married '54- widowed Dec 10th'08. 4 sons - '55, '58, '60, '69; 1 daughter - '63; 12 grandchildren; 9 great-grandchildren. 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 Other children are close and attentive. ACP/End-of-life: No CPR, no mechanical ventilation, no futile/heroic measures.   Social Determinants of Health   Financial Resource Strain: Not on file  Food Insecurity: Not on file  Transportation Needs: Not on file  Physical Activity: Not on file  Stress: Not on file  Social Connections: Not on file  Intimate Partner Violence: Not on file     Allergies  Allergen Reactions  . Nabumetone Anaphylaxis, Swelling and Other (See Comments)    Causes inflammation of airways  . Tape Other (See Comments)    SKIN IS VERY THIN AND TEARS EASILY- PLEASE USE AN ALTERNATIVE TO TAPE, IF POSSIBLE!!     Outpatient Medications Prior to Visit  Medication Sig Dispense Refill  . allopurinol (ZYLOPRIM) 100 MG tablet TAKE 1 TABLET BY MOUTH  DAILY 90 tablet 3  . amLODipine (NORVASC) 5 MG tablet TAKE 1 TABLET BY MOUTH  DAILY 90 tablet 3  . atorvastatin (LIPITOR) 40 MG tablet TAKE 2 TABLETS BY MOUTH  DAILY 180 tablet 3  . Cholecalciferol (VITAMIN D3) 1.25 MG (50000 UT) CAPS Take 1 capsule by mouth once a week. 9 capsule 0  . clopidogrel (PLAVIX) 75 MG tablet TAKE 1 TABLET BY MOUTH  DAILY 90 tablet 3  . furosemide (LASIX) 20 MG tablet TAKE 1-2 TABLETS BY MOUTH  DAILY AS NEEDED FOR EDEMA. (Patient taking differently: Take 20-40 mg by mouth daily as needed for edema.) 180 tablet 1  . isosorbide mononitrate (IMDUR) 30 MG 24 hr tablet TAKE 1 TABLET BY MOUTH  DAILY 90 tablet 3  .  levothyroxine (SYNTHROID) 50 MCG tablet TAKE 1 TABLET BY MOUTH  DAILY 90 tablet 2  . losartan (COZAAR) 25 MG tablet TAKE 1 TABLET BY MOUTH  DAILY 90 tablet 3  . metoprolol tartrate (LOPRESSOR) 25 MG tablet TAKE ONE-HALF TABLET BY  MOUTH TWICE DAILY 90 tablet 3  .  Multiple Vitamins-Minerals (PRESERVISION/LUTEIN) CAPS Take 1 capsule by mouth daily with breakfast.    . naproxen sodium (ALEVE) 220 MG tablet Take 440 mg by mouth 2 (two) times daily as needed (for back pain).     . nitroGLYCERIN (NITROSTAT) 0.4 MG SL tablet Place 0.4 mg under the tongue every 5 (five) minutes as needed for chest pain.    Marland Kitchen tiZANidine (ZANAFLEX) 4 MG tablet Take 1 tablet (4 mg total) by mouth every 8 (eight) hours as needed for muscle spasms. 90 tablet 2  . traMADol (ULTRAM) 50 MG tablet Take 0.5-1 tablets (25-50 mg total) by mouth every 8 (eight) hours as needed. 100 tablet 2  . b complex vitamins tablet Take 1 tablet by mouth daily. (Patient not taking: Reported on 06/01/2020) 100 tablet 3  . Cholecalciferol (VITAMIN D3) 50 MCG (2000 UT) capsule Take 1 capsule (2,000 Units total) by mouth daily. (Patient not taking: Reported on 06/01/2020) 100 capsule 3   No facility-administered medications prior to visit.    Review of Systems  Constitutional: Negative for chills, fever, malaise/fatigue and weight loss.  HENT: Negative for hearing loss, sore throat and tinnitus.   Eyes: Negative for blurred vision and double vision.  Respiratory: Positive for cough. Negative for hemoptysis, sputum production, shortness of breath, wheezing and stridor.   Cardiovascular: Negative for chest pain, palpitations, orthopnea, leg swelling and PND.  Gastrointestinal: Negative for abdominal pain, constipation, diarrhea, heartburn, nausea and vomiting.  Genitourinary: Negative for dysuria, hematuria and urgency.  Musculoskeletal: Negative for joint pain and myalgias.  Skin: Negative for itching and rash.  Neurological: Negative for  dizziness, tingling, weakness and headaches.  Endo/Heme/Allergies: Negative for environmental allergies. Does not bruise/bleed easily.  Psychiatric/Behavioral: Negative for depression. The patient is not nervous/anxious and does not have insomnia.   All other systems reviewed and are negative.    Objective:  Physical Exam Vitals reviewed.  Constitutional:      General: She is not in acute distress.    Appearance: She is well-developed.  HENT:     Head: Normocephalic and atraumatic.     Mouth/Throat:     Mouth: Oropharynx is clear and moist.     Pharynx: No oropharyngeal exudate.  Eyes:     Extraocular Movements: EOM normal.     Conjunctiva/sclera: Conjunctivae normal.     Pupils: Pupils are equal, round, and reactive to light.  Neck:     Vascular: No JVD.     Trachea: No tracheal deviation.     Comments: Loss of supraclavicular fat Cardiovascular:     Rate and Rhythm: Normal rate and regular rhythm.     Pulses: Intact distal pulses.     Heart sounds: S1 normal and S2 normal.     Comments: Distant heart tones Pulmonary:     Effort: No tachypnea or accessory muscle usage.     Breath sounds: No stridor. Decreased breath sounds (throughout all lung fields) present. No wheezing, rhonchi or rales.     Comments: Increased AP chest diameter Abdominal:     General: Bowel sounds are normal. There is no distension.     Palpations: Abdomen is soft.     Tenderness: There is no abdominal tenderness.  Musculoskeletal:        General: Deformity (muscle wasting ) present. No edema.  Skin:    General: Skin is warm and dry.     Capillary Refill: Capillary refill takes less than 2 seconds.     Findings: No rash.  Neurological:  Mental Status: She is alert and oriented to person, place, and time.  Psychiatric:        Mood and Affect: Mood and affect normal.        Behavior: Behavior normal.      Vitals:   06/01/20 1044  BP: 120/70  Pulse: 60  Temp: (!) 97 F (36.1 C)   TempSrc: Tympanic  SpO2: 96%  Weight: 129 lb (58.5 kg)  Height: 5\' 3"  (1.6 m)   96% on RA BMI Readings from Last 3 Encounters:  06/01/20 22.85 kg/m  05/18/20 22.94 kg/m  04/25/20 22.50 kg/m   Wt Readings from Last 3 Encounters:  06/01/20 129 lb (58.5 kg)  05/18/20 129 lb 8 oz (58.7 kg)  04/25/20 127 lb (57.6 kg)     CBC    Component Value Date/Time   WBC 8.6 05/18/2020 0000   WBC 8.9 01/12/2020 1130   RBC 4.77 05/18/2020 0000   HGB 13.6 05/18/2020 0000   HCT 42 05/18/2020 0000   PLT 217 05/18/2020 0000   MCV 88 05/18/2020 0000   MCH 28.0 01/12/2020 1130   MCHC 32.5 01/12/2020 1130   RDW 12.8 01/12/2020 1130   LYMPHSABS 1,994 01/12/2020 1130   MONOABS 0.4 01/06/2019 1135   EOSABS 125 01/12/2020 1130   BASOSABS 62 01/12/2020 1130     Chest Imaging: CT abdomen: As a large infrarenal abdominal aortic aneurysm.  Additionally has a enlarging left lower lobe lobular pulmonary nodule concerning for a primary bronchogenic carcinoma.  In comparison to previous imaging that was completed in 2019 which shows a small nodule at the time has significantly enlarged. The patient's images have been independently reviewed by me.    Pulmonary Functions Testing Results: No flowsheet data found.  FeNO: no  Pathology: no  Echocardiogram: no  Heart Catheterization: no    Assessment & Plan:     ICD-10-CM   1. Lung nodule  R91.1 CT Super D Chest Wo Contrast    NM PET Image Initial (PI) Skull Base To Thigh    Ambulatory referral to Pulmonology  2. Current smoker  F17.200   3. Severe aortic stenosis  I35.0   4. History of ST elevation myocardial infarction (STEMI)  I25.2   5. CAD S/P percutaneous coronary angioplasty  I25.10    Z98.61   6. Long term current use of antithrombotics/antiplatelets  Z50.02     Discussion:  86 year old female with longstanding history of coronary artery disease, peripheral vascular disease, now with abdominal aortic aneurysm followed by  vascular surgery.  During this work-up she had a lung nodule found within the left anterior lower lobe measuring 1.8 x 1.0 cm compared to a 5 mm lesion noticed in 2019.  Subsequent follow-up suggest that has enlarged and therefore concerning for primary bronchogenic carcinoma.  Plan: She will need to hold her Plavix prior to the procedure. This has been discussed with the patient today and I have sent a message to vascular surgery, primary care as well as her cardiologist. We discussed the risk benefits and alternatives of proceeding with tissue biopsy to include video bronchoscopy with endobronchial navigation and likely fiducial placement. With her ongoing medical comorbidities I do not believe that she be a good candidate for a lobectomy. We also discussed this in detail today and the patient is not interested in undergoing a significant procedure like this at the age of 39.  I agree with her decision regarding this. Therefore we can plan for hopeful tissue biopsy plus fiducial  placement for consideration of SBRT.  Patient will be tentatively scheduled for 06/13/2020 Patient to be n.p.o. at midnight the night before  A PET scan and super D CT have been ordered.    Current Outpatient Medications:  .  allopurinol (ZYLOPRIM) 100 MG tablet, TAKE 1 TABLET BY MOUTH  DAILY, Disp: 90 tablet, Rfl: 3 .  amLODipine (NORVASC) 5 MG tablet, TAKE 1 TABLET BY MOUTH  DAILY, Disp: 90 tablet, Rfl: 3 .  atorvastatin (LIPITOR) 40 MG tablet, TAKE 2 TABLETS BY MOUTH  DAILY, Disp: 180 tablet, Rfl: 3 .  Cholecalciferol (VITAMIN D3) 1.25 MG (50000 UT) CAPS, Take 1 capsule by mouth once a week., Disp: 9 capsule, Rfl: 0 .  clopidogrel (PLAVIX) 75 MG tablet, TAKE 1 TABLET BY MOUTH  DAILY, Disp: 90 tablet, Rfl: 3 .  furosemide (LASIX) 20 MG tablet, TAKE 1-2 TABLETS BY MOUTH  DAILY AS NEEDED FOR EDEMA. (Patient taking differently: Take 20-40 mg by mouth daily as needed for edema.), Disp: 180 tablet, Rfl: 1 .  isosorbide  mononitrate (IMDUR) 30 MG 24 hr tablet, TAKE 1 TABLET BY MOUTH  DAILY, Disp: 90 tablet, Rfl: 3 .  levothyroxine (SYNTHROID) 50 MCG tablet, TAKE 1 TABLET BY MOUTH  DAILY, Disp: 90 tablet, Rfl: 2 .  losartan (COZAAR) 25 MG tablet, TAKE 1 TABLET BY MOUTH  DAILY, Disp: 90 tablet, Rfl: 3 .  metoprolol tartrate (LOPRESSOR) 25 MG tablet, TAKE ONE-HALF TABLET BY  MOUTH TWICE DAILY, Disp: 90 tablet, Rfl: 3 .  Multiple Vitamins-Minerals (PRESERVISION/LUTEIN) CAPS, Take 1 capsule by mouth daily with breakfast., Disp: , Rfl:  .  naproxen sodium (ALEVE) 220 MG tablet, Take 440 mg by mouth 2 (two) times daily as needed (for back pain). , Disp: , Rfl:  .  nitroGLYCERIN (NITROSTAT) 0.4 MG SL tablet, Place 0.4 mg under the tongue every 5 (five) minutes as needed for chest pain., Disp: , Rfl:  .  tiZANidine (ZANAFLEX) 4 MG tablet, Take 1 tablet (4 mg total) by mouth every 8 (eight) hours as needed for muscle spasms., Disp: 90 tablet, Rfl: 2 .  traMADol (ULTRAM) 50 MG tablet, Take 0.5-1 tablets (25-50 mg total) by mouth every 8 (eight) hours as needed., Disp: 100 tablet, Rfl: 2 .  b complex vitamins tablet, Take 1 tablet by mouth daily. (Patient not taking: Reported on 06/01/2020), Disp: 100 tablet, Rfl: 3 .  Cholecalciferol (VITAMIN D3) 50 MCG (2000 UT) capsule, Take 1 capsule (2,000 Units total) by mouth daily. (Patient not taking: Reported on 06/01/2020), Disp: 100 capsule, Rfl: 3  I spent 62 minutes dedicated to the care of this patient on the date of this encounter to include pre-visit review of records, face-to-face time with the patient discussing conditions above, post visit ordering of testing, clinical documentation with the electronic health record, making appropriate referrals as documented, and communicating necessary findings to members of the patients care team.   Garner Nash, [place] 06/01/2020 11:14 AM

## 2020-06-01 NOTE — Patient Instructions (Addendum)
Thank you for visiting Dr. Valeta Harms at [facility]. Today we recommend the following:  Orders Placed This Encounter  Procedures  . CT Super D Chest Wo Contrast  . NM PET Image Initial (PI) Skull Base To Thigh  . Ambulatory referral to Pulmonology    Return in about 6 weeks (around 07/13/2020) for with APP or Dr. Valeta Harms.    Please do your part to reduce the spread of COVID-19.

## 2020-06-03 DIAGNOSIS — C3490 Malignant neoplasm of unspecified part of unspecified bronchus or lung: Secondary | ICD-10-CM

## 2020-06-03 HISTORY — DX: Malignant neoplasm of unspecified part of unspecified bronchus or lung: C34.90

## 2020-06-08 ENCOUNTER — Encounter (INDEPENDENT_AMBULATORY_CARE_PROVIDER_SITE_OTHER): Payer: Self-pay

## 2020-06-08 ENCOUNTER — Other Ambulatory Visit: Payer: Self-pay

## 2020-06-08 ENCOUNTER — Ambulatory Visit (HOSPITAL_COMMUNITY)
Admission: RE | Admit: 2020-06-08 | Discharge: 2020-06-08 | Disposition: A | Discharge status: Home / Self Care | Payer: Medicare Other | Source: Ambulatory Visit | Attending: Pulmonary Disease | Admitting: Pulmonary Disease

## 2020-06-08 DIAGNOSIS — M47814 Spondylosis without myelopathy or radiculopathy, thoracic region: Secondary | ICD-10-CM | POA: Diagnosis not present

## 2020-06-08 DIAGNOSIS — I714 Abdominal aortic aneurysm, without rupture: Secondary | ICD-10-CM | POA: Diagnosis not present

## 2020-06-08 DIAGNOSIS — I2510 Atherosclerotic heart disease of native coronary artery without angina pectoris: Secondary | ICD-10-CM | POA: Diagnosis not present

## 2020-06-08 DIAGNOSIS — R911 Solitary pulmonary nodule: Secondary | ICD-10-CM | POA: Diagnosis not present

## 2020-06-08 DIAGNOSIS — J432 Centrilobular emphysema: Secondary | ICD-10-CM | POA: Diagnosis not present

## 2020-06-09 ENCOUNTER — Encounter (HOSPITAL_COMMUNITY): Payer: Self-pay | Admitting: Pulmonary Disease

## 2020-06-09 NOTE — Progress Notes (Signed)
PCP - Dr Alain Marion Cardiologist - [name]  Chest x-ray - CT  Chest 06/08/20 EKG - 08/02/19 Stress Test - 04/21/14 ECHO - 02/01/20 Cardiac Cath - 01/25/18 CE  Anesthesia review: Yes  STOP now taking any Aspirin (unless otherwise instructed by your surgeon), Aleve, Naproxen, Ibuprofen, Motrin, Advil, Goody's, BC's, all herbal medications, fish oil, and all vitamins.   Coronavirus Screening Covid test scheduled on 06/12/20. Do you have any of the following symptoms:  Cough yes/no: No Fever (>100.72F)  yes/no: No Runny nose yes/no: No Sore throat yes/no: No Difficulty breathing/shortness of breath  yes/no: No  Have you traveled in the last 14 days and where? yes/no: No  Patient verbalized understanding of instructions that were given via phone.

## 2020-06-12 ENCOUNTER — Other Ambulatory Visit (HOSPITAL_COMMUNITY)
Admission: RE | Admit: 2020-06-12 | Discharge: 2020-06-12 | Disposition: A | Discharge status: Home / Self Care | Payer: Medicare Other | Source: Ambulatory Visit | Attending: Pulmonary Disease | Admitting: Pulmonary Disease

## 2020-06-12 ENCOUNTER — Other Ambulatory Visit: Payer: Self-pay

## 2020-06-12 ENCOUNTER — Encounter (HOSPITAL_COMMUNITY): Payer: Self-pay | Admitting: Pulmonary Disease

## 2020-06-12 ENCOUNTER — Ambulatory Visit (HOSPITAL_COMMUNITY): Payer: Medicare Other

## 2020-06-12 ENCOUNTER — Ambulatory Visit (HOSPITAL_COMMUNITY)
Admission: RE | Admit: 2020-06-12 | Discharge: 2020-06-12 | Disposition: A | Discharge status: Home / Self Care | Payer: Medicare Other | Source: Ambulatory Visit | Attending: Hematology and Oncology | Admitting: Hematology and Oncology

## 2020-06-12 DIAGNOSIS — K5730 Diverticulosis of large intestine without perforation or abscess without bleeding: Secondary | ICD-10-CM | POA: Diagnosis not present

## 2020-06-12 DIAGNOSIS — I2510 Atherosclerotic heart disease of native coronary artery without angina pectoris: Secondary | ICD-10-CM | POA: Diagnosis not present

## 2020-06-12 DIAGNOSIS — Z20822 Contact with and (suspected) exposure to covid-19: Secondary | ICD-10-CM | POA: Insufficient documentation

## 2020-06-12 DIAGNOSIS — I700 Atherosclerosis of aorta: Secondary | ICD-10-CM | POA: Diagnosis not present

## 2020-06-12 DIAGNOSIS — R591 Generalized enlarged lymph nodes: Secondary | ICD-10-CM | POA: Diagnosis not present

## 2020-06-12 DIAGNOSIS — J439 Emphysema, unspecified: Secondary | ICD-10-CM | POA: Diagnosis not present

## 2020-06-12 DIAGNOSIS — R911 Solitary pulmonary nodule: Secondary | ICD-10-CM | POA: Insufficient documentation

## 2020-06-12 DIAGNOSIS — I714 Abdominal aortic aneurysm, without rupture: Secondary | ICD-10-CM | POA: Diagnosis not present

## 2020-06-12 DIAGNOSIS — Z01812 Encounter for preprocedural laboratory examination: Secondary | ICD-10-CM | POA: Insufficient documentation

## 2020-06-12 DIAGNOSIS — E042 Nontoxic multinodular goiter: Secondary | ICD-10-CM | POA: Diagnosis not present

## 2020-06-12 LAB — GLUCOSE, CAPILLARY: Glucose-Capillary: 93 mg/dL (ref 70–99)

## 2020-06-12 LAB — SARS CORONAVIRUS 2 (TAT 6-24 HRS): SARS Coronavirus 2: NEGATIVE

## 2020-06-12 MED ORDER — FLUDEOXYGLUCOSE F - 18 (FDG) INJECTION
6.2000 | Freq: Once | INTRAVENOUS | Status: AC | PRN
  Administered 2020-06-12: 6.2 via INTRAVENOUS

## 2020-06-12 NOTE — Progress Notes (Signed)
Anesthesia Chart Review: SAME DAY WORK-UP    Case: [id number] Date/Time: 06/13/20 1300   Procedure: VIDEO BRONCHOSCOPY WITH ENDOBRONCHIAL NAVIGATION (N/A )   Anesthesia type: General   Pre-op diagnosis: lung nodule   Location: [facility] ROOM 2 / [place] [facility]   Surgeons: Garner Nash, DO      DISCUSSION: Patient is an 87 year old female scheduled for the above procedure. She has known severe AS and recent enlargement of AAA to > 5 cm. 04/24/20 CTA showed AAA measured 5.9 x 5.7 cm compared to 5.1 x 5.0 cm in August of 2019. There was an incidental finding of an enlarging LLL lung nodule. She has now seen oncology and pulmonology. Currently, she is not felt to be a candidate for lobectomy, so depending tissue diagnosis may be considered for future SBRT. Dr. Valeta Harms has communicated with vascular surgery and cardiology about procedure plans.     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 Reported hypotension with retinal surgery.   As of November 2021, Dr. Roselie Awkward felt lung cancer evaluation should take precedence over AAA repair. He referred patient to oncology and also to [facility] vascular surgeon Dr. Sammuel Hines for consideration of future, more complex endovascular repair of AAA.  Last cardiology visit with Dr. Gwenlyn Found on 02/01/20. Known severe AS and CAD, but asymptomatic. By 01/21/20 echo AVA 0.74 cm with a peak gradient of 80 mmHg. He recommended follow-up with echo in one year (scheduled for 01/10/21). Dr. Valeta Harms has communicated procedure plans and need to temporarily hold Plavix with Dr. Gwenlyn Found.  06/12/2020 presurgical COVID-19 test in process. Anesthesia team to evaluate on the day of surgery.    VS:  BP Readings from Last 3 Encounters:  06/01/20 120/70  05/18/20 (!) 128/28   04/25/20 128/73   Pulse Readings from Last 3 Encounters:  06/01/20 60  05/18/20 66  04/25/20 62    PROVIDERS: Plotnikov, Evie Lacks, MD is PCP  Quay Burow, MD is cardiologist Jamelle Haring, MD is vascular surgeon (previously Curt Jews, MD). On 04/25/20, Dr. Roselie Awkward referred her to vascular surgeon Vinnie Level, MD with [facility] to discuss advanced endovascular arotic reconstruction given her anatomy and co-morbidities and to oncology for enlarging lung nodule on CT imaging. He felt possible new lung cancer "currently presents the greatest risk to her life...may need to delay treatment of her AAA to treat her lung cancer." Metta Clines, DO is neurologist HEM-ONC is through [facility]. Last visit 05/18/20 with Dayton Scrape, NP.   LABS: For day of surgery. Currently, last lab results include: Lab Results  Component Value Date   WBC 8.6 05/18/2020   HGB 13.6 05/18/2020   HCT 42 05/18/2020   PLT 217 05/18/2020   GLUCOSE 97 01/12/2020   ALT 14 05/18/2020   AST 24 05/18/2020   NA 142 05/18/2020   K 4.2 05/18/2020   CL 108 05/18/2020   CREATININE 1.0 05/18/2020   BUN 15 05/18/2020   CO2 26 (A) 05/18/2020   TSH 7.68 (H) 07/12/2019     IMAGES: PET Scan 06/12/20: IMPRESSION: 1. Mildly hypermetabolic spiculated 2.0 cm right upper lobe pulmonary nodule, compatible with primary bronchogenic carcinoma. Hypermetabolic 2.2 cm irregular anterior left lower lobe pulmonary nodule, compatible with synchronous primary bronchogenic carcinoma. 2. Several subcentimeter scattered bilateral solid pulmonary nodules, largest 0.8  cm in the left lower lobe with abnormal low level FDG uptake, suspicious for pulmonary metastases. 3. Mildly hypermetabolic bilateral paratracheal lymphadenopathy, cannot exclude nodal metastases. 4. No hypermetabolic extrathoracic metastatic disease. 5. Intense focal hypermetabolism associated with a mildly thick walled short segment of distal transverse colon  in the left abdomen, cannot exclude primary colonic neoplasm. Colonoscopy correlation suggested. 6. Infrarenal long irregular fusiform 6.3 cm abdominal aortic aneurysm. Recommend referral to a vascular specialist. This recommendation follows ACR consensus guidelines: White Paper of the ACR Incidental Findings Committee II on Vascular Findings. J Am Coll Radiol 2013; 10:789-794. 7. Chronic findings include: Aortic Atherosclerosis (ICD10-I70.0) and Emphysema (ICD10-J43.9). Coronary atherosclerosis. Mild sigmoid diverticulosis.   CT Super D Chest 06/08/20: IMPRESSION: 1. Irregular spiculated 2.2 cm peripheral right upper lobe pulmonary nodule, increased in size since 2019 chest CT, suspicious for primary bronchogenic carcinoma. 2. Irregular solid 2.3 cm anterior left lower lobe pulmonary nodule, increased in size since 2019 chest CT, suspicious for synchronous primary bronchogenic carcinoma. PET-CT and multidisciplinary thoracic oncology consultation suggested. 3. Additional new subcentimeter solid pulmonary nodules in the bilateral lower lobes, cannot exclude pulmonary metastases. 4. Mild right paratracheal adenopathy is stable since 10/02/2017 chest CT, more likely benign. 5. Partially visualized infrarenal abdominal aortic aneurysm. Please see the 04/24/2020 CT angiogram of the abdomen and pelvis report for details. 6. Aortic Atherosclerosis (ICD10-I70.0) and Emphysema (ICD10-J43.9).   CTA abd/pelvis 04/24/20: IMPRESSION: VASCULAR 1. Enlarging fusiform aneurysmal dilation of the infrarenal abdominal aorta now with maximal dimensions of 5.9 x 5.7 cm compared to 5.1 x 5.0 cm in August of 2019. Recommend referral to a vascular specialist. This recommendation follows ACR consensus guidelines: White Paper of the ACR Incidental Findings Committee II on Vascular Findings. J Am Coll Radiol 2013; 10:789-794. Aortic aneurysm NOS (ICD10-I71.9); Aortic Atherosclerosis (ICD10-I70.0). 2.  Mild aneurysmal dilation of the left common iliac artery measuring up to 1.8 cm. 3. Extensive atherosclerotic calcifications as detailed above. NON-VASCULAR 1. Enlarging left lower lobe lobular pulmonary nodule with peripheral spiculations highly concerning for primary bronchogenic carcinoma. Recommend PET-CT and referral to multidisciplinary thoracic oncology conference [facility]). 2. Additional ancillary findings as above without significant interval change.   EKG: 08/02/2019: Normal sinus rhythm Left axis deviation Anterior infarct, age undetermined   CV: Echo 01/21/20 IMPRESSIONS  1. Akinesis of the basal inferolateral wall with overall low normal LV  function; severe LVH; grade 1 diastolic dysfunction; severe AS; trace AI;  mild MR; severe LAE.  2. Left ventricular ejection fraction, by estimation, is 50 to 55%. The  left ventricle has low normal function. The left ventricle demonstrates  regional wall motion abnormalities (see scoring diagram/findings for  description). There is severe left  ventricular hypertrophy. Left ventricular diastolic parameters are  consistent with Grade I diastolic dysfunction (impaired relaxation).  Elevated left atrial pressure.  3. Right ventricular systolic function is normal. The right ventricular  size is normal. Tricuspid regurgitation signal is inadequate for assessing  PA pressure.  4. Left atrial size was severely dilated.  5. The mitral valve is normal in structure. Mild mitral valve  regurgitation. No evidence of mitral stenosis.  6. The aortic valve has an indeterminant number of cusps. Aortic valve  regurgitation is trivial. Severe aortic valve stenosis. Aortic valve mean gradient  measures 34.0 mmHg. Aortic valve peak  gradient measures 78.9 mmHg. Aortic valve area, by VTI measures 0.74 cm.  7. The inferior vena cava is normal in size with greater than 50%  respiratory variability, suggesting right atrial pressure of  3 mmHg.   (Comparison 12/02/18: LVEF 50-55%, inferior basal hypokinesis, mild AI, severe AS, gradients stable since July 2019 but AVA decreased to 0.69 cm2 from  0.96 cm2 and DVI is 0.20 indicating svere AS.)   Cardiac cath 01/25/18 ([facility], [facility] Care Everywhere): Diagnostic Summary  1. Acute occlusion of Mid Cir (STEMI) bifurcating with OM  2. Moderate lesions of Diag and RCA.  3. Moderate LV dysfunction with infroapical wall hypokinesis, LVEF 40-45%  4. Aortic valve mean gradient 25 mmHg  Diagnostic Recommendations  PCI to Cir.  Interventional Summary  Access switched to right femoral artery after diagnostic angiogram due to  severe radial artery spasm, unable to advance the Guide catheter  1. Successful PCI / Resolute Onyx Drug Eluting Stent of the prox and mid  Circumflex Coronary Artery.  2. Successful angioplasty to OM1 ostial.  Interventional Recommendations  Dual anti-platelet therapy with Ticagrelor and Aspirin 81 mg for at least 12  months is recommended.    Past Medical History:  Diagnosis Date  . Abdominal aortic aneurysm ([place])    followed by Dr. Sherren Mocha Early  . Arthritis   . Blind left eye   . CAD (coronary artery disease) 08/1995   50-55% second diag dz.; occluded OM  . Cataract   . Colon cancer ([place])   . Complication of anesthesia    "Blood pressure went down" with retinal surgery - no problems with previous surgeries  . Echocardiogram abnormal 03/2012   mild to mod AS, valve area1.58 cm2, mild concentric LVH, grade I diastolic dusfunction  . Gout   . Heart murmur   . Hyperlipidemia   . Hypertension   . MI (myocardial infarction) ([place]) 01/25/2018  . Myocardial infarction [facility])    mild/ age 27  . Osteoporosis   . PAD (peripheral artery disease) ([facility])    mild to mod Rt ICA stenosis followed by doppler-02/05/12  . Pelvic mass   . Stroke [facility]) 2001   no residual problems  . Thyroid disease   . Tobacco abuse   . Ulcer     Past Surgical History:  Procedure Laterality  Date  . ABDOMINAL HYSTERECTOMY    . APPENDECTOMY    . CARDIAC CATHETERIZATION  08/1995   50-55% second diag lesion, occluded OM  . CARDIAC CATHETERIZATION  01/25/2018   results located in [place]  . CATARACT EXTRACTION W/ INTRAOCULAR LENS  IMPLANT, BILATERAL    . COLONOSCOPY    . EYE SURGERY     lense implant removed/ blind in left eye  . LAPAROTOMY N/A 04/26/2014   Procedure: EXPLORATORY LAPAROTOMY/RESECTION OF PELVIC MASS;  Surgeon: Alvino Chapel, MD;  Location: [facility] ORS;  Service: Gynecology;  Laterality: N/A;  . OOPHORECTOMY     and fallopian tube  . POLYPECTOMY    . RETINAL DETACHMENT SURGERY  2006  . TONSILLECTOMY AND ADENOIDECTOMY    . TUBAL LIGATION    . UPPER GASTROINTESTINAL ENDOSCOPY     treated for h-pylori    MEDICATIONS: No current facility-administered medications for this encounter.   Marland Kitchen acetaminophen (TYLENOL) 325 MG tablet  . allopurinol (ZYLOPRIM) 100 MG tablet  . amLODipine (NORVASC) 5 MG tablet  . atorvastatin (LIPITOR) 40 MG tablet  . Cholecalciferol (VITAMIN D3) 50 MCG (2000 UT) TABS  . furosemide (LASIX) 20 MG tablet  . isosorbide mononitrate (IMDUR) 30 MG 24 hr tablet  . levothyroxine (SYNTHROID) 50 MCG tablet  . losartan (COZAAR) 25 MG tablet  . metoprolol tartrate (LOPRESSOR) 25 MG tablet  .  Multiple Vitamins-Minerals (PRESERVISION/LUTEIN) CAPS  . nitroGLYCERIN (NITROSTAT) 0.4 MG SL tablet  . tiZANidine (ZANAFLEX) 4 MG tablet  . traMADol (ULTRAM) 50 MG tablet  . b complex vitamins tablet  . clopidogrel (PLAVIX) 75 MG tablet  Plavix is documented as being on hold for procedure.   Myra Gianotti, PA-C Surgical Short Stay/Anesthesiology [facility] Phone [phone] [facility] Phone [phone] 06/12/2020 2:41 PM

## 2020-06-12 NOTE — Anesthesia Preprocedure Evaluation (Addendum)
Anesthesia Evaluation  Patient identified by MRN, date of birth, ID band Patient awake    Reviewed: Allergy & Precautions, NPO status , Patient's Chart, lab work & pertinent test results, reviewed documented beta blocker date and time   History of Anesthesia Complications (+) history of anesthetic complications  Airway Mallampati: II  TM Distance: >3 FB Neck ROM: Full    Dental  (+) Edentulous Upper, Edentulous Lower   Pulmonary Current Smoker and Patient abstained from smoking.,    Pulmonary exam normal breath sounds clear to auscultation + decreased breath sounds      Cardiovascular hypertension, Pt. on medications and Pt. on home beta blockers + CAD, + Past MI, + Cardiac Stents and + Peripheral Vascular Disease  + Valvular Problems/Murmurs AS  Rhythm:Regular Rate:Normal + Systolic murmurs and + Carotid Bruit Echo 01/21/20 - LVEF ~ 50-55%, Akinesis of the basal inferolateral wall with overall low normal LV function; severe LVH; grade 1 diastolic dysfunction; elevated LA pressure, severe LAE; RV systolic pressure normal; mild MR; trace AI, severe AS, Aortic valve mean gradient measures 34.0 mmHg. Aortic valve peak gradient measures 78.9 mmHg. Aortic valve area, by VTI measures 0.74 cm.  - (Comparison 12/02/18: LVEF 50-55%, inferior basal hypokinesis, mild AI, severe AS, gradients stable since July 2019 but AVA decreased to 0.69 cm2 from  0.96 cm2 and DVI is 0.20 indicating svere AS.)   Cardiac cath 01/25/18: 1. Acute occlusion of Mid Cir (STEMI) bifurcating with OM  2. Moderate lesions of Diag and RCA.  3. Moderate LV dysfunction with infroapical wall hypokinesis, LVEF 40-45%  4. Aortic valve mean gradient 25 mmHg  PCI: Successful PCI / Resolute Onyx Drug Eluting Stent of the prox and mid Circumflex Coronary Artery.  2. Successful angioplasty to OM1 ostial.   MI 01/25/18 STEMI S/P Stents x 3  AAA 5.9cm followed by Dr.  Donnetta Hutching   Bilateral carotid stenosis   Neuro/Psych Blind OS CVA, No Residual Symptoms negative psych ROS   GI/Hepatic Neg liver ROS, Hx/o Colon Ca    Endo/Other  Hypothyroidism Hyperlipidemia Gout Osteoporosis  Renal/GU Renal InsufficiencyRenal disease  negative genitourinary   Musculoskeletal  (+) Arthritis , Osteoarthritis,    Abdominal (+) - obese,   Peds  Hematology Plavix therapy- last dose 06/06/20   Anesthesia Other Findings   Reproductive/Obstetrics                          Anesthesia Physical Anesthesia Plan  ASA: III  Anesthesia Plan: General   Post-op Pain Management:    Induction: Intravenous  PONV Risk Score and Plan: 3 and Treatment may vary due to age or medical condition and Ondansetron  Airway Management Planned: Oral ETT  Additional Equipment: Arterial line  Intra-op Plan:   Post-operative Plan: Extubation in OR  Informed Consent: I have reviewed the patients History and Physical, chart, labs and discussed the procedure including the risks, benefits and alternatives for the proposed anesthesia with the patient or authorized representative who has indicated his/her understanding and acceptance.     Dental advisory given  Plan Discussed with: Anesthesiologist and CRNA  Anesthesia Plan Comments: (See PAT note written 06/12/2020 by Myra Gianotti, PA-C. Dr. Valeta Harms has communicated with Dr. Roselie Awkward and Dr. Gwenlyn Found. She has 5.9 cm AAA and known CAD and severe AS. Severe AS is being monitored since she was asymptomatic.    Etomidate for induction.)      Anesthesia Quick Evaluation

## 2020-06-13 ENCOUNTER — Ambulatory Visit (HOSPITAL_COMMUNITY)
Admission: RE | Admit: 2020-06-13 | Discharge: 2020-06-13 | Disposition: A | Discharge status: Home / Self Care | Payer: Medicare Other | Attending: Pulmonary Disease | Admitting: Pulmonary Disease

## 2020-06-13 ENCOUNTER — Ambulatory Visit (HOSPITAL_COMMUNITY): Payer: Medicare Other | Admitting: Vascular Surgery

## 2020-06-13 ENCOUNTER — Ambulatory Visit (HOSPITAL_COMMUNITY): Payer: Medicare Other

## 2020-06-13 ENCOUNTER — Encounter (HOSPITAL_COMMUNITY)
Admission: RE | Disposition: A | Discharge status: Home / Self Care | Payer: Self-pay | Source: Home / Self Care | Attending: Pulmonary Disease

## 2020-06-13 ENCOUNTER — Other Ambulatory Visit: Payer: Self-pay

## 2020-06-13 ENCOUNTER — Encounter (HOSPITAL_COMMUNITY): Payer: Self-pay | Admitting: Pulmonary Disease

## 2020-06-13 DIAGNOSIS — Z7902 Long term (current) use of antithrombotics/antiplatelets: Secondary | ICD-10-CM | POA: Insufficient documentation

## 2020-06-13 DIAGNOSIS — I714 Abdominal aortic aneurysm, without rupture: Secondary | ICD-10-CM | POA: Diagnosis not present

## 2020-06-13 DIAGNOSIS — Z8249 Family history of ischemic heart disease and other diseases of the circulatory system: Secondary | ICD-10-CM | POA: Insufficient documentation

## 2020-06-13 DIAGNOSIS — Z803 Family history of malignant neoplasm of breast: Secondary | ICD-10-CM | POA: Diagnosis not present

## 2020-06-13 DIAGNOSIS — Z91048 Other nonmedicinal substance allergy status: Secondary | ICD-10-CM | POA: Insufficient documentation

## 2020-06-13 DIAGNOSIS — I700 Atherosclerosis of aorta: Secondary | ICD-10-CM | POA: Insufficient documentation

## 2020-06-13 DIAGNOSIS — Z9889 Other specified postprocedural states: Secondary | ICD-10-CM

## 2020-06-13 DIAGNOSIS — Z85038 Personal history of other malignant neoplasm of large intestine: Secondary | ICD-10-CM | POA: Diagnosis not present

## 2020-06-13 DIAGNOSIS — C3411 Malignant neoplasm of upper lobe, right bronchus or lung: Secondary | ICD-10-CM | POA: Insufficient documentation

## 2020-06-13 DIAGNOSIS — I2510 Atherosclerotic heart disease of native coronary artery without angina pectoris: Secondary | ICD-10-CM | POA: Diagnosis not present

## 2020-06-13 DIAGNOSIS — Z888 Allergy status to other drugs, medicaments and biological substances status: Secondary | ICD-10-CM | POA: Insufficient documentation

## 2020-06-13 DIAGNOSIS — E559 Vitamin D deficiency, unspecified: Secondary | ICD-10-CM | POA: Diagnosis not present

## 2020-06-13 DIAGNOSIS — I739 Peripheral vascular disease, unspecified: Secondary | ICD-10-CM | POA: Diagnosis not present

## 2020-06-13 DIAGNOSIS — I119 Hypertensive heart disease without heart failure: Secondary | ICD-10-CM | POA: Diagnosis not present

## 2020-06-13 DIAGNOSIS — Z79899 Other long term (current) drug therapy: Secondary | ICD-10-CM | POA: Diagnosis not present

## 2020-06-13 DIAGNOSIS — R918 Other nonspecific abnormal finding of lung field: Secondary | ICD-10-CM | POA: Diagnosis not present

## 2020-06-13 DIAGNOSIS — Z8673 Personal history of transient ischemic attack (TIA), and cerebral infarction without residual deficits: Secondary | ICD-10-CM | POA: Diagnosis not present

## 2020-06-13 DIAGNOSIS — I723 Aneurysm of iliac artery: Secondary | ICD-10-CM | POA: Diagnosis not present

## 2020-06-13 DIAGNOSIS — R911 Solitary pulmonary nodule: Secondary | ICD-10-CM | POA: Diagnosis not present

## 2020-06-13 DIAGNOSIS — I252 Old myocardial infarction: Secondary | ICD-10-CM | POA: Insufficient documentation

## 2020-06-13 DIAGNOSIS — E785 Hyperlipidemia, unspecified: Secondary | ICD-10-CM | POA: Diagnosis not present

## 2020-06-13 DIAGNOSIS — R590 Localized enlarged lymph nodes: Secondary | ICD-10-CM | POA: Diagnosis not present

## 2020-06-13 DIAGNOSIS — F17210 Nicotine dependence, cigarettes, uncomplicated: Secondary | ICD-10-CM | POA: Insufficient documentation

## 2020-06-13 DIAGNOSIS — R846 Abnormal cytological findings in specimens from respiratory organs and thorax: Secondary | ICD-10-CM | POA: Diagnosis not present

## 2020-06-13 DIAGNOSIS — J439 Emphysema, unspecified: Secondary | ICD-10-CM | POA: Insufficient documentation

## 2020-06-13 DIAGNOSIS — Z833 Family history of diabetes mellitus: Secondary | ICD-10-CM | POA: Insufficient documentation

## 2020-06-13 DIAGNOSIS — C3432 Malignant neoplasm of lower lobe, left bronchus or lung: Secondary | ICD-10-CM | POA: Insufficient documentation

## 2020-06-13 HISTORY — DX: Hypothyroidism, unspecified: E03.9

## 2020-06-13 HISTORY — PX: BRONCHIAL NEEDLE ASPIRATION BIOPSY: SHX5106

## 2020-06-13 HISTORY — PX: VIDEO BRONCHOSCOPY WITH ENDOBRONCHIAL ULTRASOUND: SHX6177

## 2020-06-13 HISTORY — PX: FIDUCIAL MARKER PLACEMENT: SHX6858

## 2020-06-13 HISTORY — PX: BRONCHIAL WASHINGS: SHX5105

## 2020-06-13 HISTORY — PX: BRONCHIAL BRUSHINGS: SHX5108

## 2020-06-13 HISTORY — PX: BRONCHIAL BIOPSY: SHX5109

## 2020-06-13 HISTORY — PX: VIDEO BRONCHOSCOPY WITH ENDOBRONCHIAL NAVIGATION: SHX6175

## 2020-06-13 LAB — BASIC METABOLIC PANEL
Anion gap: 9 (ref 5–15)
BUN: 15 mg/dL (ref 8–23)
CO2: 24 mmol/L (ref 22–32)
Calcium: 9.2 mg/dL (ref 8.9–10.3)
Chloride: 106 mmol/L (ref 98–111)
Creatinine, Ser: 1 mg/dL (ref 0.44–1.00)
GFR, Estimated: 56 mL/min — ABNORMAL LOW (ref >60)
Glucose, Bld: 80 mg/dL (ref 70–99)
Potassium: 3.8 mmol/L (ref 3.5–5.1)
Sodium: 139 mmol/L (ref 135–145)

## 2020-06-13 LAB — CBC
HCT: 43.8 % (ref 36.0–46.0)
Hemoglobin: 13.1 g/dL (ref 12.0–15.0)
MCH: 27.1 pg (ref 26.0–34.0)
MCHC: 29.9 g/dL — ABNORMAL LOW (ref 30.0–36.0)
MCV: 90.7 fL (ref 80.0–100.0)
Platelets: 220 10*3/uL (ref 150–400)
RBC: 4.83 MIL/uL (ref 3.87–5.11)
RDW: 13.6 % (ref 11.5–15.5)
WBC: 7.2 10*3/uL (ref 4.0–10.5)
nRBC: 0 % (ref 0.0–0.2)

## 2020-06-13 SURGERY — VIDEO BRONCHOSCOPY WITH ENDOBRONCHIAL NAVIGATION
Anesthesia: General

## 2020-06-13 MED ORDER — DEXAMETHASONE SODIUM PHOSPHATE 10 MG/ML IJ SOLN
INTRAMUSCULAR | Status: DC | PRN
  Administered 2020-06-13: 5 mg via INTRAVENOUS

## 2020-06-13 MED ORDER — ROCURONIUM BROMIDE 10 MG/ML (PF) SYRINGE
PREFILLED_SYRINGE | INTRAVENOUS | Status: DC | PRN
  Administered 2020-06-13: 50 mg via INTRAVENOUS

## 2020-06-13 MED ORDER — FENTANYL CITRATE (PF) 100 MCG/2ML IJ SOLN
INTRAMUSCULAR | Status: DC | PRN
  Administered 2020-06-13: 25 ug via INTRAVENOUS
  Administered 2020-06-13: 50 ug via INTRAVENOUS
  Administered 2020-06-13: 25 ug via INTRAVENOUS

## 2020-06-13 MED ORDER — PROPOFOL 10 MG/ML IV BOLUS
INTRAVENOUS | Status: DC | PRN
  Administered 2020-06-13: 30 mg via INTRAVENOUS
  Administered 2020-06-13: 20 mg via INTRAVENOUS
  Administered 2020-06-13: 50 mg via INTRAVENOUS

## 2020-06-13 MED ORDER — LIDOCAINE 2% (20 MG/ML) 5 ML SYRINGE
INTRAMUSCULAR | Status: DC | PRN
  Administered 2020-06-13: 40 mg via INTRAVENOUS

## 2020-06-13 MED ORDER — ONDANSETRON HCL 4 MG/2ML IJ SOLN
INTRAMUSCULAR | Status: DC | PRN
  Administered 2020-06-13: 4 mg via INTRAVENOUS

## 2020-06-13 MED ORDER — PHENYLEPHRINE HCL-NACL 10-0.9 MG/250ML-% IV SOLN
INTRAVENOUS | Status: DC | PRN
  Administered 2020-06-13: 20 ug/min via INTRAVENOUS

## 2020-06-13 MED ORDER — LACTATED RINGERS IV SOLN: INTRAVENOUS | Status: DC

## 2020-06-13 SURGICAL SUPPLY — 47 items

## 2020-06-13 NOTE — Anesthesia Procedure Notes (Signed)
Arterial Line Insertion Start/End1/04/2021 12:44 PM, 06/13/2020 12:47 PM Performed by: Annye Asa, MD, Genelle Bal, CRNA, CRNA  Patient location: Pre-op. Preanesthetic checklist: patient identified, IV checked, site marked, risks and benefits discussed, surgical consent, monitors and equipment checked, pre-op evaluation, timeout performed and anesthesia consent Lidocaine 1% used for infiltration Left, radial was placed Catheter size: 20 Fr Hand hygiene performed  and maximum sterile barriers used   Attempts: 1 Procedure performed without using ultrasound guided technique. Following insertion, dressing applied. Post procedure assessment: normal and unchanged  Patient tolerated the procedure well with no immediate complications.

## 2020-06-13 NOTE — Anesthesia Postprocedure Evaluation (Signed)
Anesthesia Post Note  Patient: Tammy Brooks  Procedure(s) Performed: VIDEO BRONCHOSCOPY WITH ENDOBRONCHIAL NAVIGATION (N/A ) BRONCHIAL BRUSHINGS BRONCHIAL NEEDLE ASPIRATION BIOPSIES BRONCHIAL BIOPSIES FIDUCIAL MARKER PLACEMENT BRONCHIAL WASHINGS VIDEO BRONCHOSCOPY WITH ENDOBRONCHIAL ULTRASOUND     Patient location during evaluation: PACU Anesthesia Type: General Level of consciousness: sedated Pain management: pain level controlled Vital Signs Assessment: post-procedure vital signs reviewed and stable Respiratory status: spontaneous breathing and respiratory function stable Cardiovascular status: stable Postop Assessment: no apparent nausea or vomiting Anesthetic complications: no   No complications documented.  Last Vitals:  Vitals:   06/13/20 1555 06/13/20 1600  BP: (!) 110/59   Pulse: 64 67  Resp: (!) 21 18  Temp:    SpO2: 94% 90%    Last Pain:  Vitals:   06/13/20 1555  TempSrc:   PainSc: 0-No pain                 [place] DANIEL

## 2020-06-13 NOTE — Transfer of Care (Signed)
Immediate Anesthesia Transfer of Care Note  Patient: Tammy Brooks  Procedure(s) Performed: VIDEO BRONCHOSCOPY WITH ENDOBRONCHIAL NAVIGATION (N/A ) BRONCHIAL BRUSHINGS BRONCHIAL NEEDLE ASPIRATION BIOPSIES BRONCHIAL BIOPSIES FIDUCIAL MARKER PLACEMENT BRONCHIAL WASHINGS VIDEO BRONCHOSCOPY WITH ENDOBRONCHIAL ULTRASOUND  Patient Location: Endoscopy Unit  Anesthesia Type:General  Level of Consciousness: awake and alert   Airway & Oxygen Therapy: Patient Spontanous Breathing and Patient connected to face mask oxygen  Post-op Assessment: Report given to RN and Post -op Vital signs reviewed and stable  Post vital signs: Reviewed and stable  Last Vitals:  Vitals Value Taken Time  BP    Temp    Pulse    Resp    SpO2      Last Pain:  Vitals:   06/13/20 1104  TempSrc:   PainSc: 0-No pain      Patients Stated Pain Goal: 3 (70/92/95 [id number])  Complications: No complications documented.

## 2020-06-13 NOTE — Anesthesia Procedure Notes (Signed)
Procedure Name: Intubation Date/Time: 06/13/2020 12:52 PM Performed by: Genelle Bal, CRNA Pre-anesthesia Checklist: Patient identified, Emergency Drugs available, Suction available and Patient being monitored Patient Re-evaluated:Patient Re-evaluated prior to induction Oxygen Delivery Method: Circle system utilized Preoxygenation: Pre-oxygenation with 100% oxygen Induction Type: IV induction Ventilation: Mask ventilation without difficulty Laryngoscope Size: Miller and 2 Grade View: Grade I Tube type: Oral Tube size: 8.5 mm Number of attempts: 1 Airway Equipment and Method: Stylet and Oral airway Placement Confirmation: ETT inserted through vocal cords under direct vision,  positive ETCO2 and breath sounds checked- equal and bilateral Secured at: 21 cm Tube secured with: Tape Dental Injury: Teeth and Oropharynx as per pre-operative assessment

## 2020-06-13 NOTE — Op Note (Signed)
Video Bronchoscopy with Electromagnetic Navigation Procedure Note Video bronchoscopy with fluoroscopic guided fiducial placement Video Bronchoscopy with Endobronchial Ultrasound Procedure Note  Date of Operation: 06/13/2020  Pre-op Diagnosis: Bilateral lung nodules, mediastinal adenopathy  Post-op Diagnosis: Bilateral lung nodules, mediastinal adenopathy  Surgeon: Garner Nash, DO  Assistants: None   Anesthesia: General endotracheal anesthesia  Operation: Flexible video fiberoptic bronchoscopy with electromagnetic navigation and biopsies.  Estimated Blood Loss: Minimal  Complications: None   Indications and History: Tammy Brooks is a 87 y.o. female with bilateral nodules, small mediastinal adenopathy.  The risks, benefits, complications, treatment options and expected outcomes were discussed with the patient.  The possibilities of pneumothorax, pneumonia, reaction to medication, pulmonary aspiration, perforation of a viscus, bleeding, failure to diagnose a condition and creating a complication requiring transfusion or operation were discussed with the patient who freely signed the consent.    Description of Procedure: The patient was seen in the Preoperative Area, was examined and was deemed appropriate to proceed.  The patient was taken to [facility] endoscopy room 2, identified as Tammy Brooks and the procedure verified as Flexible Video Fiberoptic Bronchoscopy.  A Time Out was held and the above information confirmed.   Prior to the date of the procedure a high-resolution CT scan of the chest was performed. Utilizing [place] a virtual tracheobronchial tree was generated to allow the creation of distinct navigation pathways to the patient's parenchymal abnormalities. After being taken to the operating room general anesthesia was initiated and the patient  was orally intubated. The video fiberoptic bronchoscope was introduced via the endotracheal tube and a general inspection  was performed which showed normal right and left lung anatomy with no evidence of endobronchial lesion.   Target #1 left lower lobe The extendable working channel and locator guide were introduced into the bronchoscope.  A full fluoroscopic sleep was obtained from RAO 25 degrees to LAO 25 degrees with inspiratory breath-hold APL at 20 cm of water. The distinct navigation pathways prepared prior to this procedure were then utilized to navigate to within 0.8cm of patient's lesion(s) identified on CT scan. The extendable working channel was secured into place and the locator guide was withdrawn. Under fluoroscopic guidance transbronchial needle brushings, transbronchial Wang needle biopsies, and transbronchial forceps biopsies were performed to be sent for cytology and pathology.  At the conclusion of tissue sampling 1 solitary fiducial was placed as the lesion is wedged in between a fissure line and the pericardium.  Fiducial was placed under continuous fluoroscopy with a fiducial guide sheath and wire delivery method.  A bronchioalveolar lavage was performed in the left lower lobe and sent for cytology and microbiology (bacterial, fungal, AFB smears and cultures).  Target #2 right upper lobe: The extendable working channel and locator guide were introduced into the bronchoscope.  A full fluoroscopic sweep was obtained from RAO 25 degrees daily to 25 degrees with inspiratory breath-hold APL 20 cm water.  The distinct navigation pathways prepared prior to this procedure were then utilized to navigate to within 0.8cm of patient's lesion(s) identified on CT scan. The extendable working channel was secured into place and the locator guide was withdrawn. Under fluoroscopic guidance transbronchial needle brushings, transbronchial Wang needle biopsies, and transbronchial forceps biopsies were performed to be sent for cytology and pathology.  Following tissue sampling 3 fiducial markers were placed within approximately  1.8 to 2 cm from lesion center within 3 separate planes.  Fiducials were placed with the fiducial guide sheath and wire delivery  method. A bronchioalveolar lavage was performed in the right upper lobe and sent for cytology and microbiology (bacterial, fungal, AFB smears and cultures).  Endobronchial ultrasound description of Procedure: The standard scope was then withdrawn and the endobronchial ultrasound was used to identify and characterize the peritracheal, hilar and bronchial lymph nodes. Inspection showed a small 4L node, left paratracheal region was identified.  It was approximately 0.75 cm in largest cross-section and approximately 1 cm wide.  We were able to access the space with a 21-gauge Olympus needle.  We also looked in the right paratracheal region and there was no visible nodal structures to stick.  Possibly some in between 2 vessels that did not offer a clear path for sampling. Using real-time ultrasound guidance Wang needle biopsies were take from Station 4L, left paratracheal region, nodes and were sent for cytology. The patient tolerated the procedure well without apparent complications.   At the end of the procedure a general airway inspection was performed and there was no evidence of active bleeding.  Standard scope was reinserted into the airway and there was therapeutic aspiration of the bilateral mainstem's with removal of blood clots.  We removed all blood clots and secretions in all distal subsegment airways were patent at the termination of the procedure with no evidence of active bleeding.  The bronchoscope was brought just above the main carina for observation.  The bronchoscope was removed.  The patient tolerated the procedure well. There was no significant blood loss and there were no obvious complications. A post-procedural chest x-ray is pending.  Samples Target #1: 1. Transbronchial needle brushings from left lower lobe 2. Transbronchial Wang needle biopsies from left  lower lobe 3. Transbronchial forceps biopsies from left lower lobe 4. Bronchoalveolar lavage from the lower lobe  Samples target #2: 1. Transbronchial needle brushings from right upper lobe 2. Transbronchial Wang needle biopsies from right upper lobe 3. Transbronchial forceps biopsies from right upper lobe 4. Bronchoalveolar lavage from right upper lobe  Samples endobronchial ultrasound, left paratracheal, 4L:  1. Wang needle biopsies from station 4L node  Plans:  The patient will be discharged from the PACU to home when recovered from anesthesia and after chest x-ray is reviewed. We will review the cytology, pathology and microbiology results with the patient when they become available. Outpatient followup will be with Garner Nash, DO.   Garner Nash, DO [facility] 06/13/2020 3:15 PM

## 2020-06-13 NOTE — Discharge Instructions (Signed)
Flexible Bronchoscopy, Care After This sheet gives you information about how to care for yourself after your test. Your doctor may also give you more specific instructions. If you have problems or questions, contact your doctor. Follow these instructions at home: Eating and drinking  Do not eat or drink anything (not even water) for 2 hours after your test, or until your numbing medicine (local anesthetic) wears off.  When your numbness is gone and your cough and gag reflexes have come back, you may: ? Eat only soft foods. ? Slowly drink liquids.  The day after the test, go back to your normal diet. Driving  Do not drive for 24 hours if you were given a medicine to help you relax (sedative).  Do not drive or use heavy machinery while taking prescription pain medicine. General instructions   Take over-the-counter and prescription medicines only as told by your doctor.  Return to your normal activities as told. Ask what activities are safe for you.  Do not use any products that have nicotine or tobacco in them. This includes cigarettes and e-cigarettes. If you need help quitting, ask your doctor.  Keep all follow-up visits as told by your doctor. This is important. It is very important if you had a tissue sample (biopsy) taken. Get help right away if:  You have shortness of breath that gets worse.  You get light-headed.  You feel like you are going to pass out (faint).  You have chest pain.  You cough up: ? More than a little blood. ? More blood than before. Summary  Do not eat or drink anything (not even water) for 2 hours after your test, or until your numbing medicine wears off.  Do not use cigarettes. Do not use e-cigarettes.  Get help right away if you have chest pain.   This information is not intended to replace advice given to you by your health care provider. Make sure you discuss any questions you have with your health care provider. Document Released:  03/17/2009 Document Revised: 05/02/2017 Document Reviewed: 06/07/2016 Elsevier Patient Education  2020 [employer].

## 2020-06-13 NOTE — Interval H&P Note (Signed)
History and Physical Interval Note:  06/13/2020 12:27 PM  Tammy Brooks  has presented today for surgery, with the diagnosis of lung nodule.  The various methods of treatment have been discussed with the patient and family. After consideration of risks, benefits and other options for treatment, the patient has consented to  Procedure(s): [place] (N/A) as a surgical intervention.  The patient's history has been reviewed, patient examined, no change in status, stable for surgery.  I have reviewed the patient's chart and labs.  Questions were answered to the patient's satisfaction.    Discussed CT and pet imaging. We will plan for bilateral navigation to both nodules and endobronchial ultrasound to the lymph nodes.   [place]

## 2020-06-14 ENCOUNTER — Encounter (HOSPITAL_COMMUNITY): Payer: Self-pay | Admitting: Pulmonary Disease

## 2020-06-14 ENCOUNTER — Encounter: Payer: Self-pay | Admitting: *Deleted

## 2020-06-14 DIAGNOSIS — R918 Other nonspecific abnormal finding of lung field: Secondary | ICD-10-CM

## 2020-06-14 NOTE — Progress Notes (Signed)
I received a message from Dr. Valeta Harms patient needs to be set up with Rad Onc. I will complete referral and update their team of referral.

## 2020-06-15 LAB — ACID FAST SMEAR (AFB, MYCOBACTERIA)
Acid Fast Smear: NEGATIVE
Acid Fast Smear: NEGATIVE

## 2020-06-15 LAB — CYTOLOGY - NON PAP

## 2020-06-16 LAB — CULTURE, BAL-QUANTITATIVE W GRAM STAIN
Culture: NO GROWTH
Culture: NO GROWTH

## 2020-06-18 LAB — ANAEROBIC CULTURE

## 2020-06-20 ENCOUNTER — Telehealth: Payer: Self-pay | Admitting: Pulmonary Disease

## 2020-06-20 LAB — CYTOLOGY - NON PAP

## 2020-06-20 NOTE — Telephone Encounter (Signed)
PCCM:  I tried calling the numbers provided in the chart to inform patient of final pathology results.  Both locations within the right lung and left lung were consistent with non-small cell lung cancer.  Lymph node was negative.  Patient has an appointment to see radiation oncology scheduled for next week.  We will try to reach out to her again in the future.  Garner Nash, DO [facility] 06/20/2020 3:19 PM

## 2020-06-22 NOTE — Progress Notes (Signed)
Thoracic Location of Tumor / Histology: bilateral Lung cancer  Patient presented  months ago with symptoms of: Patient was having scans to monitor AAA and a nodule was noticed on the left side but nothing was done about it at that time and then at a later scan she was found to have a nodule on the right side.    Biopses:  06/13/2020  FINAL MICROSCOPIC DIAGNOSIS:   A. LUNG, LEFT LOWER LOBE, BRUSHING:  - Atypical cells present.   B. LUNG, LEFT LOWER LOBE, FINE NEEDLE ASPIRATION:  - Malignant cells present consistent with non-small cell carcinoma.   COMMENT:   B. P40 is strong and diffusely positive. TTF-1 demonstrates scattered  weak nonspecific staining. CK7, CK20, CDX-2, PAX 8 and GATA-3 are  negative. The morphologic and immunophenotypic characteristics are  compatible with squamous cell carcinoma. Cellblock material is limited.  Dr. Melina Copa reviewed.   06/13/2020 FINAL MICROSCOPIC DIAGNOSIS:   D. LUNG, RIGHT UPPER LOBE, BRUSHING:  - Malignant cells present consistent with non-small cell carcinoma.   E. LUNG, RIGHT UPPER LOBE, FINE NEEDLE ASPIRATION:  - Malignant cells present consistent with non-small cell carcinoma.   Tobacco/Marijuana/Snuff/ETOH use: Current every day smoker (0.5 ppd)  Past/Anticipated interventions by cardiothoracic surgery, if any:  06/13/20 VIDEO BRONCHOSCOPY WITH ENDOBRONCHIAL NAVIGATION Dr.Bradley Icard   Past/Anticipated interventions by medical oncology, if any: no  Signs/Symptoms  Weight changes, if any: no  Respiratory complaints, if any: denies  Hemoptysis, if any: no  Pain issues, if any:  no  SAFETY ISSUES:  Prior radiation? no  Pacemaker/ICD? no  Possible current pregnancy? no  Is the patient on methotrexate? no  Current Complaints / other details: Patient lives with her son Quillian Quince.    Vitals:   06/29/20 1340  BP: (!) 147/70  Pulse: (!) 58  Resp: 20  Temp: 97.8 F (36.6 C)  SpO2: 100%  Weight: 128 lb 3.2 oz (58.2  kg)  Height: 5\' 3"  (1.6 m)   Rhae Hammock, RN BSN

## 2020-06-28 NOTE — Progress Notes (Signed)
Radiation Oncology         [phone] ________________________________  Initial Outpatient Consultation  Name: Tammy Brooks MRN: [id number]  Date: 06/29/2020  DOB: August 16, 1933  OE:UMPNTIRWE, Evie Lacks, MD  Garner Nash, DO   REFERRING PHYSICIAN: Garner Nash, DO  DIAGNOSIS: The encounter diagnosis was Lung nodules.  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 female who is seen as a courtesy of Dr. Valeta Harms for an opinion concerning radiation therapy as part of management for her recently diagnosed lung cancer. Today, she is accompanied by her son. The patient underwent a CTA of the abdomen and pelvis on 04/24/2020 for AAA surveillance. Results showed an enlarging left lower lobe pulmonary nodule with peripheral spiculations that were highly concerning for primary bronchogenic carcinoma.  Super D chest CT scan was performed on 06/08/2020 and showed an irregular spiculated 2.2 cm peripheral right upper lobe pulmonary nodule that had increased in size and was suspicious for primary bronchogenic carcinoma. There was also noted to be an irregular solid 2.3 cm anterior left lower lobe pulmonary nodule that had also increased in size and was suspicious for synchronous primary bronchogenic carcinoma. Finally, there were additional new sub-centimeter solid pulmonary nodules in the bilateral lower lobes; pulmonary metastases could not be excluded.  PET scan on 06/12/2020 showed a mildly hypermetabolic spiculated 2.0 cm right upper lobe pulmonary nodule that was compatible with primary bronchogenic carcinoma. There was also noted to be a hypermetabolic 2.2 cm irregular anterior left lower lobe pulmonary nodule that was compatible with synchronous primary bronchogenic carcinoma. Additionally, there were several sub-centimeter scattered bilateral solid pulmonary nodules, the largest of which was 0.8 cm in the  left lower lobe and had an abnormal low level FDG uptake, suspicious for pulmonary metastases. Finally, there were mildly hypermetabolic bilateral paratracheal lymphadenopathy, nodal metastases could not be excluded, and intense focal hypermetabolism associated with a mildly thick-walled short segment of distal transverse colon in the left abdomen, primary colonic neoplasm could not be excluded. There was no hypermetabolic extrathoracic metastatic disease.  Subsequently, the patient was seen by Dr. Valeta Harms, who performed a flexible video fiberoptic bronchoscopy with electromagnetic navigation and biopsies on 06/13/2020. Cytology from the procedure revealed atypical cells in the left lower lobe brushing and malignant cells consistent with non-small cell carcinoma of the left lower lobe fine needle aspiration. There were also malignant cells present consistent with non-small cell carcinoma of the right upper lobe brushing and fine needle aspiration. Lymph node 4L was aspirated and no carcinoma was identified.   PREVIOUS RADIATION THERAPY: No  PAST MEDICAL HISTORY:  Past Medical History:  Diagnosis Date  . Abdominal aortic aneurysm ([place])    followed by Dr. Sherren Mocha Early  . Arthritis    hands  . Blind left eye   . CAD (coronary artery disease) 08/1995   50-55% second diag dz.; occluded OM  . Cataract   . Complication of anesthesia    "Blood pressure went down" with retinal surgery - no problems with previous surgeries  . Echocardiogram abnormal 03/2012   mild to mod AS, valve area1.58 cm2, mild concentric LVH, grade I diastolic dusfunction  . Gout   . Heart murmur   . Hyperlipidemia   . Hypertension   . Hypothyroidism   . MI (myocardial infarction) ([place]) 01/25/2018  . Myocardial infarction [facility])    mild/ age 49  . Osteoporosis   . PAD (  peripheral artery disease) ([facility])    mild to mod Rt ICA stenosis followed by doppler-02/05/12  . Pelvic mass   . Stroke [facility]) 2001   no residual problems  .  Thyroid disease   . Tobacco abuse   . Ulcer     PAST SURGICAL HISTORY: Past Surgical History:  Procedure Laterality Date  . ABDOMINAL HYSTERECTOMY    . APPENDECTOMY    . BRONCHIAL BIOPSY  06/13/2020   Procedure: BRONCHIAL BIOPSIES;  Surgeon: Garner Nash, DO;  Location: [place] [facility];  Service: Pulmonary;;  . BRONCHIAL BRUSHINGS  06/13/2020   Procedure: BRONCHIAL BRUSHINGS;  Surgeon: Garner Nash, DO;  Location: [place];  Service: Pulmonary;;  . BRONCHIAL NEEDLE ASPIRATION BIOPSY  06/13/2020   Procedure: BRONCHIAL NEEDLE ASPIRATION BIOPSIES;  Surgeon: Garner Nash, DO;  Location: [place] [facility];  Service: Pulmonary;;  . BRONCHIAL WASHINGS  06/13/2020   Procedure: BRONCHIAL WASHINGS;  Surgeon: Garner Nash, DO;  Location: [place];  Service: Pulmonary;;  . CARDIAC CATHETERIZATION  08/1995   50-55% second diag lesion, occluded OM  . CARDIAC CATHETERIZATION  01/25/2018   results located in [place]  . CATARACT EXTRACTION W/ INTRAOCULAR LENS  IMPLANT, BILATERAL    . COLONOSCOPY    . EYE SURGERY Left    lense implant removed/ blind in left eye  . FIDUCIAL MARKER PLACEMENT  06/13/2020   Procedure: FIDUCIAL MARKER PLACEMENT;  Surgeon: Garner Nash, DO;  Location: [place] [facility];  Service: Pulmonary;;  . LAPAROTOMY N/A 04/26/2014   Procedure: EXPLORATORY LAPAROTOMY/RESECTION OF PELVIC MASS;  Surgeon: Alvino Chapel, MD;  Location: [facility];  Service: Gynecology;  Laterality: N/A;  . OOPHORECTOMY     and fallopian tube  . POLYPECTOMY    . RETINAL DETACHMENT SURGERY  2006  . TONSILLECTOMY AND ADENOIDECTOMY    . TUBAL LIGATION    . UPPER GASTROINTESTINAL ENDOSCOPY     treated for h-pylori  . VIDEO BRONCHOSCOPY WITH ENDOBRONCHIAL NAVIGATION N/A 06/13/2020   Procedure: VIDEO BRONCHOSCOPY WITH ENDOBRONCHIAL NAVIGATION;  Surgeon: Garner Nash, DO;  Location: [place];  Service: Pulmonary;  Laterality: N/A;  . VIDEO BRONCHOSCOPY WITH ENDOBRONCHIAL ULTRASOUND   06/13/2020   Procedure: VIDEO BRONCHOSCOPY WITH ENDOBRONCHIAL ULTRASOUND;  Surgeon: Garner Nash, DO;  Location: [facility];  Service: Pulmonary;;    FAMILY HISTORY:  Family History  Problem Relation Age of Onset  . Aneurysm Mother        AAA  . Hypertension Mother   . Hyperlipidemia Mother   . Heart disease Father        CAD, MI x 3  . Heart attack Father   . Cancer Maternal Aunt        breast cancer  . Diabetes Sister   . Hyperlipidemia Sister   . Hypertension Sister   . Colon cancer Neg Hx   . Stomach cancer Neg Hx     SOCIAL HISTORY:  Social History   Tobacco Use  . Smoking status: Current Every Day Smoker    Packs/day: 0.50    Years: 68.00    Pack years: 34.00    Types: Cigarettes  . Smokeless tobacco: Never Used  Vaping Use  . Vaping Use: Never used  Substance Use Topics  . Alcohol use: Not Currently    Alcohol/week: 0.0 standard drinks    Comment: occasionally - few drinks/month  . Drug use: Never    ALLERGIES:  Allergies  Allergen Reactions  . Nabumetone Anaphylaxis, Swelling and Other (See Comments)  Causes inflammation of airways  . Tape Other (See Comments)    SKIN IS VERY THIN AND TEARS EASILY- PLEASE USE AN ALTERNATIVE TO TAPE, IF POSSIBLE!!    MEDICATIONS:  Current Outpatient Medications  Medication Sig Dispense Refill  . allopurinol (ZYLOPRIM) 100 MG tablet TAKE 1 TABLET BY MOUTH  DAILY (Patient taking differently: Take 100 mg by mouth daily.) 90 tablet 3  . amLODipine (NORVASC) 5 MG tablet TAKE 1 TABLET BY MOUTH  DAILY (Patient taking differently: Take 5 mg by mouth daily.) 90 tablet 3  . atorvastatin (LIPITOR) 40 MG tablet TAKE 2 TABLETS BY MOUTH  DAILY (Patient taking differently: Take 80 mg by mouth at bedtime.) 180 tablet 3  . Cholecalciferol (VITAMIN D3) 50 MCG (2000 UT) TABS Take 2,000 Units by mouth daily.    . clopidogrel (PLAVIX) 75 MG tablet TAKE 1 TABLET BY MOUTH  DAILY (Patient taking differently: Take 75 mg by mouth daily.)  90 tablet 3  . isosorbide mononitrate (IMDUR) 30 MG 24 hr tablet TAKE 1 TABLET BY MOUTH  DAILY (Patient taking differently: Take 30 mg by mouth daily.) 90 tablet 3  . levothyroxine (SYNTHROID) 50 MCG tablet TAKE 1 TABLET BY MOUTH  DAILY (Patient taking differently: Take 50 mcg by mouth daily before breakfast.) 90 tablet 2  . losartan (COZAAR) 25 MG tablet TAKE 1 TABLET BY MOUTH  DAILY (Patient taking differently: Take 25 mg by mouth daily.) 90 tablet 3  . metoprolol tartrate (LOPRESSOR) 25 MG tablet TAKE ONE-HALF TABLET BY  MOUTH TWICE DAILY (Patient taking differently: Take 12.5 mg by mouth 2 (two) times daily.) 90 tablet 3  . Multiple Vitamins-Minerals (PRESERVISION/LUTEIN) CAPS Take 1 capsule by mouth 3 (three) times a week. In the morning    . Naproxen Sodium (ALEVE PO) Take by mouth.    Marland Kitchen acetaminophen (TYLENOL) 325 MG tablet Take 325 mg by mouth every 6 (six) hours as needed (pain.). (Patient not taking: Reported on 06/29/2020)    . furosemide (LASIX) 20 MG tablet TAKE 1-2 TABLETS BY MOUTH  DAILY AS NEEDED FOR EDEMA. (Patient not taking: Reported on 06/29/2020) 180 tablet 1  . nitroGLYCERIN (NITROSTAT) 0.4 MG SL tablet Place 0.4 mg under the tongue every 5 (five) minutes x 3 doses as needed for chest pain. (Patient not taking: Reported on 06/29/2020)    . tiZANidine (ZANAFLEX) 4 MG tablet Take 1 tablet (4 mg total) by mouth every 8 (eight) hours as needed for muscle spasms. (Patient not taking: Reported on 06/29/2020) 90 tablet 2  . traMADol (ULTRAM) 50 MG tablet Take 0.5-1 tablets (25-50 mg total) by mouth every 8 (eight) hours as needed. (Patient not taking: Reported on 06/29/2020) 100 tablet 2   No current facility-administered medications for this encounter.    REVIEW OF SYSTEMS:  A 10+ POINT REVIEW OF SYSTEMS WAS OBTAINED including neurology, dermatology, psychiatry, cardiac, respiratory, lymph, extremities, GI, GU, musculoskeletal, constitutional, reproductive, HEENT.  The patient denies any  pain within the chest area significant cough or hemoptysis.  She denies any significant breathing problems.  She denies being on supplemental oxygen.  No reports of headache or blurred vision.   PHYSICAL EXAM:  height is 5\' 3"  (1.6 m) and weight is 128 lb 3.2 oz (58.2 kg). Her temperature is 97.8 F (36.6 C). Her blood pressure is 147/70 (abnormal) and her pulse is 58 (abnormal). Her respiration is 20 and oxygen saturation is 100%.   General: Alert and oriented, in no acute distress HEENT: Head is normocephalic. Extraocular  movements are intact.  Neck: Neck is supple, no palpable cervical or supraclavicular lymphadenopathy. Heart: Regular in rate and rhythm with no murmurs, rubs, or gallops. Chest: Clear to auscultation bilaterally, with no rhonchi, wheezes, or rales. Abdomen: Soft, nontender, nondistended, with no rigidity or guarding. Extremities: No cyanosis or edema. Lymphatics: see Neck Exam Skin: No concerning lesions. Musculoskeletal: symmetric strength and muscle tone throughout. Neurologic: Cranial nerves II through XII are grossly intact. No obvious focalities. Speech is fluent. Coordination is intact. Psychiatric: Judgment and insight are intact. Affect is appropriate.   ECOG = 1  0 - Asymptomatic (Fully active, able to carry on all predisease activities without restriction)  1 - Symptomatic but completely ambulatory (Restricted in physically strenuous activity but ambulatory and able to carry out work of a light or sedentary nature. For example, light housework, office work)  2 - Symptomatic, <50% in bed during the day (Ambulatory and capable of all self care but unable to carry out any work activities. Up and about more than 50% of waking hours)  3 - Symptomatic, >50% in bed, but not bedbound (Capable of only limited self-care, confined to bed or chair 50% or more of waking hours)  4 - Bedbound (Completely disabled. Cannot carry on any self-care. Totally confined to bed or  chair)  5 - Death   Eustace Pen MM, Creech RH, Tormey DC, et al. [phone]). "Toxicity and response criteria of the [facility]". [place] Oncol. 5 (6): 649-55  LABORATORY DATA:  Lab Results  Component Value Date   WBC 7.2 06/13/2020   HGB 13.1 06/13/2020   HCT 43.8 06/13/2020   MCV 90.7 06/13/2020   PLT 220 06/13/2020   NEUTROABS 5.85 05/18/2020   Lab Results  Component Value Date   NA 139 06/13/2020   K 3.8 06/13/2020   CL 106 06/13/2020   CO2 24 06/13/2020   GLUCOSE 80 06/13/2020   CREATININE 1.00 06/13/2020   CALCIUM 9.2 06/13/2020      RADIOGRAPHY: NM PET Image Initial (PI) Skull Base To Thigh  Result Date: 06/12/2020 CLINICAL DATA:  Initial treatment strategy for enlarging right upper lobe and left lower lobe pulmonary nodules. EXAM: NUCLEAR MEDICINE PET SKULL BASE TO THIGH TECHNIQUE: 6.2 mCi F-18 FDG was injected intravenously. Full-ring PET imaging was performed from the skull base to thigh after the radiotracer. CT data was obtained and used for attenuation correction and anatomic localization. Fasting blood glucose: 93 mg/dl COMPARISON:  06/08/2020 chest CT. 04/24/2020 CT angiogram of the abdomen and pelvis. FINDINGS: Mediastinal blood pool activity: SUV max 2.7 Liver activity: SUV max NA NECK: No hypermetabolic lymph nodes in the neck. Incidental CT findings: none CHEST: Mildly hypermetabolic spiculated 2.0 x 1.1 cm right upper lobe pulmonary nodule with max SUV 2.8 (series 4/image 62). Hypermetabolic irregular 2.2 x 1.1 cm anterior left lower lobe pulmonary nodule with max SUV 4.6 (series 8/image 53). Low level uptake associated with 0.8 cm left lower lobe pulmonary nodule with max SUV 1.4 (series 8/image 47). Several (greater than 5) additional small bilateral solid scattered pulmonary nodules, largest 0.5 cm in the superior segment right lower lobe (series 8/image 32), below PET resolution and without significant uptake. Mildly enlarged and mildly  hypermetabolic 1.1 cm right paratracheal node with max SUV 3.3 (series 4/image 64). Mildly hypermetabolic nonenlarged 0.7 cm left paratracheal node with max SUV 3.0 (series 4/image 63). No discrete hypermetabolic hilar or axial lymph nodes. Incidental CT findings: Coronary atherosclerosis. Borderline mild cardiomegaly. Atherosclerotic nonaneurysmal thoracic  aorta. Moderate centrilobular and mild paraseptal emphysema with diffuse bronchial wall thickening. ABDOMEN/PELVIS: No abnormal hypermetabolic activity within the liver, pancreas, adrenal glands, or spleen. No hypermetabolic lymph nodes in the abdomen or pelvis. Intense focal hypermetabolism (max SUV 19.1) associated with a short segment of distal transverse colon with associated mild segmental wall thickening on the CT images (series 4/image 134). Incidental CT findings: Atherosclerotic abdominal aorta with irregular long fusiform infrarenal abdominal aortic aneurysm measuring 6.3 cm. Simple 2.5 cm lower left renal cyst. Scattered calcifications in the renal sinuses bilaterally, favor vascular calcifications. Hysterectomy. Mild sigmoid diverticulosis. SKELETON: No focal hypermetabolic activity to suggest skeletal metastasis. Incidental CT findings: none IMPRESSION: 1. Mildly hypermetabolic spiculated 2.0 cm right upper lobe pulmonary nodule, compatible with primary bronchogenic carcinoma. Hypermetabolic 2.2 cm irregular anterior left lower lobe pulmonary nodule, compatible with synchronous primary bronchogenic carcinoma. 2. Several subcentimeter scattered bilateral solid pulmonary nodules, largest 0.8 cm in the left lower lobe with abnormal low level FDG uptake, suspicious for pulmonary metastases. 3. Mildly hypermetabolic bilateral paratracheal lymphadenopathy, cannot exclude nodal metastases. 4. No hypermetabolic extrathoracic metastatic disease. 5. Intense focal hypermetabolism associated with a mildly thick walled short segment of distal transverse colon in  the left abdomen, cannot exclude primary colonic neoplasm. Colonoscopy correlation suggested. 6. Infrarenal long irregular fusiform 6.3 cm abdominal aortic aneurysm. Recommend referral to a vascular specialist. This recommendation follows ACR consensus guidelines: White Paper of the ACR Incidental Findings Committee II on Vascular Findings. J Am Coll Radiol 2013; 10:789-794. 7. Chronic findings include: Aortic Atherosclerosis (ICD10-I70.0) and Emphysema (ICD10-J43.9). Coronary atherosclerosis. Mild sigmoid diverticulosis. Electronically Signed   By: Ilona Sorrel M.D.   On: 06/12/2020 08:54   DG CHEST PORT 1 VIEW  Result Date: 06/13/2020 CLINICAL DATA:  Lung nodules.  Post bronchoscopy EXAM: PORTABLE CHEST 1 VIEW COMPARISON:  CT chest 06/08/2020 FINDINGS: Right upper lobe density with surrounding infiltrate likely related to post biopsy change of right upper lobe nodule. Three metal clips are seen in the region of the nodule. Ill-defined nodule in the left lower lobe anteriorly, not well seen on current x-ray. There is a metal clip in this region presumably related to biopsy. Negative for pneumothorax post biopsy. Cardiac enlargement without heart failure or effusion. Atherosclerotic calcification aortic arch. IMPRESSION: No complication post bronchoscopy and biopsy. Electronically Signed   By: Franchot Gallo M.D.   On: 06/13/2020 15:54   CT Super D Chest Wo Contrast  Result Date: 06/09/2020 CLINICAL DATA:  History of enlarging left lower lobe pulmonary nodule and AAA. EXAM: CT CHEST WITHOUT CONTRAST TECHNIQUE: Multidetector CT imaging of the chest was performed using thin slice collimation for electromagnetic bronchoscopy planning purposes, without intravenous contrast. COMPARISON:  04/24/2020 CT angiogram of the abdomen and pelvis. 10/02/2017 chest CT angiogram. FINDINGS: Cardiovascular: Normal heart size. No significant pericardial effusion/thickening. Three-vessel coronary atherosclerosis. Atherosclerotic  nonaneurysmal thoracic aorta. Top-normal caliber main pulmonary artery (3.1 cm diameter). Mediastinum/Nodes: No discrete thyroid nodules. Unremarkable esophagus. No axillary adenopathy. Mildly enlarged 1.0 cm right paratracheal node (series 2/image 22), stable since 10/02/2017 chest CT. No new pathologically enlarged mediastinal nodes. No discrete hilar adenopathy on these noncontrast images. Lungs/Pleura: No pneumothorax. No pleural effusion. Moderate centrilobular and paraseptal emphysema with mild diffuse bronchial wall thickening. Irregular spiculated 2.2 x 1.4 cm predominantly solid peripheral right upper lobe pulmonary nodule (series 7/image 53), increased from 1.0 x 0.7 cm on 10/02/2017 chest CT. New solid 0.4 cm superior segment right lower lobe pulmonary nodule (series 7/image 55). Irregular solid 2.3 x 1.3  cm anterior left lower lobe pulmonary nodule (series 7/image 97), increased from 0.5 x 0.4 cm on 10/02/2017 chest CT. Two additional new left lower lobe pulmonary nodules, largest 0.6 cm (series 7/image 85). Upper abdomen: Partially visualized infrarenal abdominal aortic aneurysm. Musculoskeletal: No aggressive appearing focal osseous lesions. Marked thoracic spondylosis. IMPRESSION: 1. Irregular spiculated 2.2 cm peripheral right upper lobe pulmonary nodule, increased in size since 2019 chest CT, suspicious for primary bronchogenic carcinoma. 2. Irregular solid 2.3 cm anterior left lower lobe pulmonary nodule, increased in size since 2019 chest CT, suspicious for synchronous primary bronchogenic carcinoma. PET-CT and multidisciplinary thoracic oncology consultation suggested. 3. Additional new subcentimeter solid pulmonary nodules in the bilateral lower lobes, cannot exclude pulmonary metastases. 4. Mild right paratracheal adenopathy is stable since 10/02/2017 chest CT, more likely benign. 5. Partially visualized infrarenal abdominal aortic aneurysm. Please see the 04/24/2020 CT angiogram of the abdomen  and pelvis report for details. 6. Aortic Atherosclerosis (ICD10-I70.0) and Emphysema (ICD10-J43.9). Electronically Signed   By: Ilona Sorrel M.D.   On: 06/09/2020 08:00   DG C-ARM BRONCHOSCOPY  Result Date: 06/13/2020 C-ARM BRONCHOSCOPY: Fluoroscopy was utilized by the requesting physician.  No radiographic interpretation.      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  The lesion along the left side is in close proximity to the heart and may not be able to do SBRT on this lesion if.  If not hypofractionated accelerated radiation therapy over approximately 10 treatments would be feasible.  The lesion in the right upper lung area to be treated with 3 fractions of SBRT.  Dr. Valeta Harms has kindly placed fiducial markers around both lesions to help aid in planning and treatment of her SBRT.  Today, I talked to the patient and son about the findings and work-up thus far.  We discussed the natural history of lung cancer and general treatment, highlighting the role of radiotherapy (SBRT) in the management.  We discussed the available radiation techniques, and focused on the details of logistics and delivery.  We reviewed the anticipated acute and late sequelae associated with radiation in this setting.  The patient was encouraged to ask questions that I answered to the best of my ability.  A patient consent form was discussed and signed.  We retained a copy for our records.  The patient would like to proceed with radiation and will be scheduled for CT simulation.  PLAN: The patient will return on February 3 for SBRT simulation with treatments to begin approximately a week later.  Anticipate 3 SBRT treatments directed at the right upper lung lesion.  Depending on planning 5-10 treatments of radiation therapy for the left lower lobe lesion which is adjacent to the  heart border  Total time spent in this encounter was 60 minutes which included reviewing the patient's most recent CT scans, PET scan, bronchoscopy, biopsies, cytology reports, consultations, physical examination, and documentation.   ------------------------------------------------  Blair Promise, PhD, MD  This document serves as a record of services personally performed by Gery Pray, MD. It was created on his behalf by Clerance Lav, a trained medical scribe. The creation of this record is based on the scribe's personal observations and the provider's statements to them. This document has been checked and approved by the attending provider.

## 2020-06-29 ENCOUNTER — Ambulatory Visit
Admission: RE | Admit: 2020-06-29 | Discharge: 2020-06-29 | Disposition: A | Discharge status: Home / Self Care | Payer: Medicare Other | Source: Ambulatory Visit | Attending: Radiation Oncology | Admitting: Radiation Oncology

## 2020-06-29 ENCOUNTER — Other Ambulatory Visit: Payer: Self-pay

## 2020-06-29 ENCOUNTER — Other Ambulatory Visit: Payer: Self-pay | Admitting: Internal Medicine

## 2020-06-29 ENCOUNTER — Encounter: Payer: Self-pay | Admitting: Radiation Oncology

## 2020-06-29 VITALS — BP 147/70 | HR 58 | Temp 97.8°F | Resp 20 | Ht 63.0 in | Wt 128.2 lb

## 2020-06-29 DIAGNOSIS — Z803 Family history of malignant neoplasm of breast: Secondary | ICD-10-CM | POA: Insufficient documentation

## 2020-06-29 DIAGNOSIS — C3411 Malignant neoplasm of upper lobe, right bronchus or lung: Secondary | ICD-10-CM | POA: Diagnosis not present

## 2020-06-29 DIAGNOSIS — J439 Emphysema, unspecified: Secondary | ICD-10-CM | POA: Diagnosis not present

## 2020-06-29 DIAGNOSIS — I10 Essential (primary) hypertension: Secondary | ICD-10-CM | POA: Insufficient documentation

## 2020-06-29 DIAGNOSIS — I714 Abdominal aortic aneurysm, without rupture: Secondary | ICD-10-CM | POA: Diagnosis not present

## 2020-06-29 DIAGNOSIS — Z8673 Personal history of transient ischemic attack (TIA), and cerebral infarction without residual deficits: Secondary | ICD-10-CM | POA: Insufficient documentation

## 2020-06-29 DIAGNOSIS — R918 Other nonspecific abnormal finding of lung field: Secondary | ICD-10-CM | POA: Diagnosis not present

## 2020-06-29 DIAGNOSIS — E039 Hypothyroidism, unspecified: Secondary | ICD-10-CM | POA: Diagnosis not present

## 2020-06-29 DIAGNOSIS — I739 Peripheral vascular disease, unspecified: Secondary | ICD-10-CM | POA: Insufficient documentation

## 2020-06-29 DIAGNOSIS — K5730 Diverticulosis of large intestine without perforation or abscess without bleeding: Secondary | ICD-10-CM | POA: Diagnosis not present

## 2020-06-29 DIAGNOSIS — I252 Old myocardial infarction: Secondary | ICD-10-CM | POA: Insufficient documentation

## 2020-06-29 DIAGNOSIS — E785 Hyperlipidemia, unspecified: Secondary | ICD-10-CM | POA: Diagnosis not present

## 2020-06-29 DIAGNOSIS — F17210 Nicotine dependence, cigarettes, uncomplicated: Secondary | ICD-10-CM | POA: Insufficient documentation

## 2020-06-29 DIAGNOSIS — C3432 Malignant neoplasm of lower lobe, left bronchus or lung: Secondary | ICD-10-CM | POA: Diagnosis not present

## 2020-06-29 DIAGNOSIS — C3412 Malignant neoplasm of upper lobe, left bronchus or lung: Secondary | ICD-10-CM | POA: Insufficient documentation

## 2020-06-29 DIAGNOSIS — I2510 Atherosclerotic heart disease of native coronary artery without angina pectoris: Secondary | ICD-10-CM | POA: Diagnosis not present

## 2020-06-29 DIAGNOSIS — M810 Age-related osteoporosis without current pathological fracture: Secondary | ICD-10-CM | POA: Diagnosis not present

## 2020-06-29 DIAGNOSIS — Z79899 Other long term (current) drug therapy: Secondary | ICD-10-CM | POA: Insufficient documentation

## 2020-06-29 DIAGNOSIS — C3490 Malignant neoplasm of unspecified part of unspecified bronchus or lung: Secondary | ICD-10-CM

## 2020-06-29 NOTE — Progress Notes (Signed)
See md visit for nursing note

## 2020-07-03 ENCOUNTER — Other Ambulatory Visit: Payer: Self-pay | Admitting: *Deleted

## 2020-07-03 NOTE — Progress Notes (Signed)
The proposed treatment discussed in cancer conference is for discussion purpose only and is not a binding recommendation. The patient was not physically examined nor present for their treatment options. Therefore, final treatment plans cannot be decided.  ?

## 2020-07-04 DIAGNOSIS — C801 Malignant (primary) neoplasm, unspecified: Secondary | ICD-10-CM

## 2020-07-04 HISTORY — DX: Malignant (primary) neoplasm, unspecified: C80.1

## 2020-07-06 ENCOUNTER — Ambulatory Visit
Admission: RE | Admit: 2020-07-06 | Discharge: 2020-07-06 | Disposition: A | Discharge status: Home / Self Care | Payer: Medicare Other | Source: Ambulatory Visit | Attending: Radiation Oncology | Admitting: Radiation Oncology

## 2020-07-06 ENCOUNTER — Other Ambulatory Visit: Payer: Self-pay

## 2020-07-06 DIAGNOSIS — C3411 Malignant neoplasm of upper lobe, right bronchus or lung: Secondary | ICD-10-CM | POA: Diagnosis not present

## 2020-07-06 DIAGNOSIS — Z510 Encounter for antineoplastic radiation therapy: Secondary | ICD-10-CM | POA: Insufficient documentation

## 2020-07-06 DIAGNOSIS — F17210 Nicotine dependence, cigarettes, uncomplicated: Secondary | ICD-10-CM | POA: Diagnosis not present

## 2020-07-06 DIAGNOSIS — C3432 Malignant neoplasm of lower lobe, left bronchus or lung: Secondary | ICD-10-CM | POA: Insufficient documentation

## 2020-07-06 NOTE — Progress Notes (Signed)
Patient dropped off copy of Healthcare Power of Attorney and Scientist, physiological.  Copy made and was given to appropriate staff to get scanned into Epic.

## 2020-07-09 DIAGNOSIS — C3411 Malignant neoplasm of upper lobe, right bronchus or lung: Secondary | ICD-10-CM | POA: Insufficient documentation

## 2020-07-11 DIAGNOSIS — C3432 Malignant neoplasm of lower lobe, left bronchus or lung: Secondary | ICD-10-CM | POA: Diagnosis not present

## 2020-07-11 DIAGNOSIS — F17210 Nicotine dependence, cigarettes, uncomplicated: Secondary | ICD-10-CM | POA: Diagnosis not present

## 2020-07-11 DIAGNOSIS — C3411 Malignant neoplasm of upper lobe, right bronchus or lung: Secondary | ICD-10-CM | POA: Diagnosis not present

## 2020-07-11 DIAGNOSIS — Z510 Encounter for antineoplastic radiation therapy: Secondary | ICD-10-CM | POA: Diagnosis not present

## 2020-07-12 LAB — FUNGAL ORGANISM REFLEX

## 2020-07-12 LAB — FUNGUS CULTURE WITH STAIN

## 2020-07-12 LAB — FUNGUS CULTURE RESULT

## 2020-07-13 ENCOUNTER — Other Ambulatory Visit: Payer: Self-pay

## 2020-07-13 ENCOUNTER — Ambulatory Visit: Payer: Medicare Other | Admitting: Pulmonary Disease

## 2020-07-13 ENCOUNTER — Encounter: Payer: Self-pay | Admitting: Pulmonary Disease

## 2020-07-13 VITALS — BP 124/64 | HR 69 | Temp 97.5°F | Ht 63.0 in | Wt 128.0 lb

## 2020-07-13 DIAGNOSIS — C3492 Malignant neoplasm of unspecified part of left bronchus or lung: Secondary | ICD-10-CM | POA: Diagnosis not present

## 2020-07-13 DIAGNOSIS — C3491 Malignant neoplasm of unspecified part of right bronchus or lung: Secondary | ICD-10-CM | POA: Diagnosis not present

## 2020-07-13 DIAGNOSIS — R918 Other nonspecific abnormal finding of lung field: Secondary | ICD-10-CM | POA: Diagnosis not present

## 2020-07-13 DIAGNOSIS — I714 Abdominal aortic aneurysm, without rupture, unspecified: Secondary | ICD-10-CM

## 2020-07-13 DIAGNOSIS — I350 Nonrheumatic aortic (valve) stenosis: Secondary | ICD-10-CM | POA: Diagnosis not present

## 2020-07-13 NOTE — Progress Notes (Signed)
Synopsis: Referred in December 2021 for abnormal CT chest by Plotnikov, Evie Lacks, MD  Subjective:   PATIENT ID: Tammy Brooks GENDER: female DOB: 1933-10-29, MRN: [id number]  Chief Complaint  Patient presents with  . Follow-up    A 87 year old female abdominal aortic aneurysm, coronary artery disease, aortic stenosis, hyperlipidemia, hypertension history of stroke history of MI history of peripheral arterial disease and tobacco abuse.  Patient had a CTA by vascular surgery which revealed a anterior left upper lobe lobulated pulmonary nodule of 1.8 x 1 cm compared to 0.5 mm on previous imaging concerning now for a primary bronchogenic carcinoma.  Patient was evaluated by oncology and a PET scan was ordered.  Patient was referred here for evaluation by vascular surgery.  She is a current smoker for the past 60+ years currently at about 1/2 pack/day.  06/01/2020 OV: Overall from a respiratory standpoint patient doing well.  She is on Plavix, history of stents placed greater than 2 years ago.  Cardiologist is Dr. Gwenlyn Found.  Sees Dr. Stanford Breed from vascular surgery.  Unfortunately she is still smoking.  She has been put to sleep before in the past for EGD.  She has not had imaging of the chest recently.  This was caught on the abdominal CT.  Patient denies fever chills weight loss or night sweats.  07/13/2020: Here today for follow-up after bronchoscopy.  Patient had navigational bronchoscopy to both right and left lung.  Diagnosed lung nodules as non-small cell lung cancer.  Both stage I disease.  Reviewed path today in the office with patient.  Also reviewed planning as she is already been seen in consultation by radiation oncology.  Patient has been set up for SBRT treatments which should start next week.  From respiratory standpoint she has remained stable.  No significant cough or shortness of breath.  She is still not sure what they are going to do about her abdominal aneurysm.   Past Medical History:   Diagnosis Date  . Abdominal aortic aneurysm ([place])    followed by Dr. Sherren Mocha Early  . Arthritis    hands  . Blind left eye   . CAD (coronary artery disease) 08/1995   50-55% second diag dz.; occluded OM  . Cataract   . Complication of anesthesia    "Blood pressure went down" with retinal surgery - no problems with previous surgeries  . Echocardiogram abnormal 03/2012   mild to mod AS, valve area1.58 cm2, mild concentric LVH, grade I diastolic dusfunction  . Gout   . Heart murmur   . Hyperlipidemia   . Hypertension   . Hypothyroidism   . MI (myocardial infarction) ([place]) 01/25/2018  . Myocardial infarction [facility])    mild/ age 58  . Osteoporosis   . PAD (peripheral artery disease) ([facility])    mild to mod Rt ICA stenosis followed by doppler-02/05/12  . Pelvic mass   . Stroke [facility]) 2001   no residual problems  . Thyroid disease   . Tobacco abuse   . Ulcer      Family History  Problem Relation Age of Onset  . Aneurysm Mother        AAA  . Hypertension Mother   . Hyperlipidemia Mother   . Heart disease Father        CAD, MI x 3  . Heart attack Father   . Cancer Maternal Aunt        breast cancer  . Diabetes Sister   . Hyperlipidemia Sister   .  Hypertension Sister   . Colon cancer Neg Hx   . Stomach cancer Neg Hx      Past Surgical History:  Procedure Laterality Date  . ABDOMINAL HYSTERECTOMY    . APPENDECTOMY    . BRONCHIAL BIOPSY  06/13/2020   Procedure: BRONCHIAL BIOPSIES;  Surgeon: Garner Nash, DO;  Location: [place] [facility];  Service: Pulmonary;;  . BRONCHIAL BRUSHINGS  06/13/2020   Procedure: BRONCHIAL BRUSHINGS;  Surgeon: Garner Nash, DO;  Location: [place];  Service: Pulmonary;;  . BRONCHIAL NEEDLE ASPIRATION BIOPSY  06/13/2020   Procedure: BRONCHIAL NEEDLE ASPIRATION BIOPSIES;  Surgeon: Garner Nash, DO;  Location: [place] [facility];  Service: Pulmonary;;  . BRONCHIAL WASHINGS  06/13/2020   Procedure: BRONCHIAL WASHINGS;  Surgeon: Garner Nash, DO;   Location: [place];  Service: Pulmonary;;  . CARDIAC CATHETERIZATION  09/09/1995   50-55% second diag lesion, occluded OM  . CARDIAC CATHETERIZATION  01/25/2018   results located in [place]  . CATARACT EXTRACTION W/ INTRAOCULAR LENS  IMPLANT, BILATERAL    . COLONOSCOPY    . EYE SURGERY Left    lense implant removed/ blind in left eye  . FIDUCIAL MARKER PLACEMENT  06/13/2020   Procedure: FIDUCIAL MARKER PLACEMENT;  Surgeon: Garner Nash, DO;  Location: [place] [facility];  Service: Pulmonary;;  . LAPAROTOMY N/A 04/26/2014   Procedure: EXPLORATORY LAPAROTOMY/RESECTION OF PELVIC MASS;  Surgeon: Alvino Chapel, MD;  Location: [facility];  Service: Gynecology;  Laterality: N/A;  . OOPHORECTOMY     and fallopian tube  . POLYPECTOMY    . RETINAL DETACHMENT SURGERY  2004-09-08  . TONSILLECTOMY AND ADENOIDECTOMY    . TUBAL LIGATION    . UPPER GASTROINTESTINAL ENDOSCOPY     treated for h-pylori  . VIDEO BRONCHOSCOPY WITH ENDOBRONCHIAL NAVIGATION N/A 06/13/2020   Procedure: VIDEO BRONCHOSCOPY WITH ENDOBRONCHIAL NAVIGATION;  Surgeon: Garner Nash, DO;  Location: [place];  Service: Pulmonary;  Laterality: N/A;  . VIDEO BRONCHOSCOPY WITH ENDOBRONCHIAL ULTRASOUND  06/13/2020   Procedure: VIDEO BRONCHOSCOPY WITH ENDOBRONCHIAL ULTRASOUND;  Surgeon: Garner Nash, DO;  Location: [place] [facility];  Service: Pulmonary;;    Social History   Socioeconomic History  . Marital status: Widowed    Spouse name: Not on file  . Number of children: 5  . Years of education: 9  . Highest education level: 9th grade  Occupational History  . Occupation: retired    Comment: retired  Tobacco Use  . Smoking status: Current Every Day Smoker    Packs/day: 0.50    Years: 68.00    Pack years: 34.00    Types: Cigarettes  . Smokeless tobacco: Never Used  Vaping Use  . Vaping Use: Never used  Substance and Sexual Activity  . Alcohol use: Not Currently    Alcohol/week: 0.0 standard drinks    Comment: occasionally -  few drinks/month  . Drug use: Never  . Sexual activity: Not Currently    Partners: Male    Birth control/protection: Surgical    Comment: Hysterectomy  Other Topics Concern  . Not on file  Social History Narrative   9th grade. married 09/08/52- widowed Dec 10th'08. 4 sons - '55, '58, '60, '69 (two of these have passes away); 1 daughter - 09/08/61 (deceased); 12 grandchildren; 78 great-grandchildren. 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 Other children are close and attentive. ACP/End-of-life: No CPR, no mechanical ventilation, no futile/heroic measures.   Social Determinants of Health  Financial Resource Strain: Not on file  Food Insecurity: Not on file  Transportation Needs: Not on file  Physical Activity: Not on file  Stress: Not on file  Social Connections: Not on file  Intimate Partner Violence: Not on file     Allergies  Allergen Reactions  . Nabumetone Anaphylaxis, Swelling and Other (See Comments)    Causes inflammation of airways  . Tape Other (See Comments)    SKIN IS VERY THIN AND TEARS EASILY- PLEASE USE AN ALTERNATIVE TO TAPE, IF POSSIBLE!!     Outpatient Medications Prior to Visit  Medication Sig Dispense Refill  . acetaminophen (TYLENOL) 325 MG tablet Take 325 mg by mouth every 6 (six) hours as needed (pain.).    Marland Kitchen allopurinol (ZYLOPRIM) 100 MG tablet TAKE 1 TABLET BY MOUTH  DAILY (Patient taking differently: Take 100 mg by mouth daily.) 90 tablet 3  . amLODipine (NORVASC) 5 MG tablet TAKE 1 TABLET BY MOUTH  DAILY (Patient taking differently: Take 5 mg by mouth daily.) 90 tablet 3  . atorvastatin (LIPITOR) 40 MG tablet TAKE 2 TABLETS BY MOUTH  DAILY (Patient taking differently: Take 80 mg by mouth at bedtime.) 180 tablet 3  . Cholecalciferol (VITAMIN D3) 50 MCG (2000 UT) TABS Take 2,000 Units by mouth daily.    . clopidogrel (PLAVIX) 75 MG tablet TAKE 1 TABLET BY MOUTH  DAILY (Patient taking differently: Take 75 mg by mouth  daily.) 90 tablet 3  . furosemide (LASIX) 20 MG tablet TAKE 1-2 TABLETS BY MOUTH  DAILY AS NEEDED FOR EDEMA. 180 tablet 1  . isosorbide mononitrate (IMDUR) 30 MG 24 hr tablet TAKE 1 TABLET BY MOUTH  DAILY (Patient taking differently: Take 30 mg by mouth daily.) 90 tablet 3  . levothyroxine (SYNTHROID) 50 MCG tablet TAKE 1 TABLET BY MOUTH  DAILY 90 tablet 0  . losartan (COZAAR) 25 MG tablet TAKE 1 TABLET BY MOUTH  DAILY (Patient taking differently: Take 25 mg by mouth daily.) 90 tablet 3  . metoprolol tartrate (LOPRESSOR) 25 MG tablet TAKE ONE-HALF TABLET BY  MOUTH TWICE DAILY (Patient taking differently: Take 12.5 mg by mouth 2 (two) times daily.) 90 tablet 3  . Multiple Vitamins-Minerals (PRESERVISION/LUTEIN) CAPS Take 1 capsule by mouth 3 (three) times a week. In the morning    . Naproxen Sodium (ALEVE PO) Take by mouth.    . nitroGLYCERIN (NITROSTAT) 0.4 MG SL tablet Place 0.4 mg under the tongue every 5 (five) minutes x 3 doses as needed for chest pain.    Marland Kitchen tiZANidine (ZANAFLEX) 4 MG tablet Take 1 tablet (4 mg total) by mouth every 8 (eight) hours as needed for muscle spasms. 90 tablet 2  . traMADol (ULTRAM) 50 MG tablet Take 0.5-1 tablets (25-50 mg total) by mouth every 8 (eight) hours as needed. 100 tablet 2   No facility-administered medications prior to visit.    Review of Systems  Constitutional: Negative for chills, fever, malaise/fatigue and weight loss.  HENT: Negative for hearing loss, sore throat and tinnitus.   Eyes: Negative for blurred vision and double vision.  Respiratory: Positive for cough and shortness of breath. Negative for hemoptysis, sputum production, wheezing and stridor.   Cardiovascular: Negative for chest pain, palpitations, orthopnea, leg swelling and PND.  Gastrointestinal: Negative for abdominal pain, constipation, diarrhea, heartburn, nausea and vomiting.  Genitourinary: Negative for dysuria, hematuria and urgency.  Musculoskeletal: Negative for joint pain  and myalgias.  Skin: Negative for itching and rash.  Neurological: Negative for dizziness, tingling,  weakness and headaches.  Endo/Heme/Allergies: Negative for environmental allergies. Does not bruise/bleed easily.  Psychiatric/Behavioral: Negative for depression. The patient is not nervous/anxious and does not have insomnia.   All other systems reviewed and are negative.    Objective:  Physical Exam Vitals reviewed.  Constitutional:      General: She is not in acute distress.    Appearance: She is well-developed.  HENT:     Head: Normocephalic and atraumatic.     Mouth/Throat:     Mouth: Oropharynx is clear and moist.     Pharynx: No oropharyngeal exudate.  Eyes:     Extraocular Movements: EOM normal.     Conjunctiva/sclera: Conjunctivae normal.     Pupils: Pupils are equal, round, and reactive to light.  Neck:     Vascular: No JVD.     Trachea: No tracheal deviation.     Comments: Loss of supraclavicular fat Cardiovascular:     Rate and Rhythm: Normal rate and regular rhythm.     Pulses: Intact distal pulses.     Heart sounds: S1 normal and S2 normal. Murmur heard.      Comments: Distant heart tones Pulmonary:     Effort: No tachypnea or accessory muscle usage.     Breath sounds: No stridor. Decreased breath sounds (throughout all lung fields) present. No wheezing, rhonchi or rales.     Comments: Increased AP chest diameter Abdominal:     General: Bowel sounds are normal. There is no distension.     Palpations: Abdomen is soft.     Tenderness: There is no abdominal tenderness.  Musculoskeletal:        General: Deformity (muscle wasting ) present. No edema.  Skin:    General: Skin is warm and dry.     Capillary Refill: Capillary refill takes less than 2 seconds.     Findings: No rash.  Neurological:     Mental Status: She is alert and oriented to person, place, and time.  Psychiatric:        Mood and Affect: Mood and affect normal.        Behavior: Behavior  normal.      Vitals:   07/13/20 1109  BP: 124/64  Pulse: 69  Temp: (!) 97.5 F (36.4 C)  TempSrc: Temporal  SpO2: 96%  Weight: 128 lb (58.1 kg)  Height: 5\' 3"  (1.6 m)   96% on RA BMI Readings from Last 3 Encounters:  07/13/20 22.67 kg/m  06/29/20 22.71 kg/m  06/13/20 23.41 kg/m   Wt Readings from Last 3 Encounters:  07/13/20 128 lb (58.1 kg)  06/29/20 128 lb 3.2 oz (58.2 kg)  06/13/20 128 lb (58.1 kg)     CBC    Component Value Date/Time   WBC 7.2 06/13/2020 1036   RBC 4.83 06/13/2020 1036   HGB 13.1 06/13/2020 1036   HCT 43.8 06/13/2020 1036   PLT 220 06/13/2020 1036   MCV 90.7 06/13/2020 1036   MCV 88 05/18/2020 0000   MCH 27.1 06/13/2020 1036   MCHC 29.9 (L) 06/13/2020 1036   RDW 13.6 06/13/2020 1036   LYMPHSABS 1,994 01/12/2020 1130   MONOABS 0.4 01/06/2019 1135   EOSABS 125 01/12/2020 1130   BASOSABS 62 01/12/2020 1130     Chest Imaging: CT abdomen: As a large infrarenal abdominal aortic aneurysm.  Additionally has a enlarging left lower lobe lobular pulmonary nodule concerning for a primary bronchogenic carcinoma.  In comparison to previous imaging that was completed in 2019 which shows a  small nodule at the time has significantly enlarged. The patient's images have been independently reviewed by me.    Pulmonary Functions Testing Results: No flowsheet data found.  FeNO: no  Pathology: no   LYMPH NODE, 4L, FINE NEEDLE ASPIRATION:  - No carcinoma identified.   D. LUNG, RIGHT UPPER LOBE, BRUSHING:  - Malignant cells present consistent with non-small cell carcinoma.   E. LUNG, RIGHT UPPER LOBE, FINE NEEDLE ASPIRATION:  - Malignant cells present consistent with non-small cell carcinoma.   A. LUNG, LEFT LOWER LOBE, BRUSHING:  - Atypical cells present.   B. LUNG, LEFT LOWER LOBE, FINE NEEDLE ASPIRATION:  - Malignant cells present consistent with non-small cell carcinoma.   Echocardiogram: no  Heart Catheterization: no    Assessment &  Plan:     ICD-10-CM   1. NSCLC of lung, stage 1, right (HCC)  C34.91   2. NSCLC of lung, stage 1, left (HCC)  C34.92   3. Lung nodules  R91.8   4. AAA (abdominal aortic aneurysm) without rupture (HCC)  I71.4   5. Severe aortic stenosis  I35.0     Discussion:  This is an 87 year old female, longstanding history of coronary disease, peripheral vascular disease, abdominal aortic aneurysm.  Patient was found to have a lung nodule during work-up of the abdominal aneurysm.  This was within the left lower lobe of the lung.  She also during CT imaging of the chest found to have a right upper lobe lung nodule.  Both were diagnosed as non-small cell lung cancers via navigational bronchoscopy and fiducial placement.  Patient has subsequently been referred to radiation oncology for SBRT.  Plan: Reviewed path today in the office. From a respiratory standpoint she is breathing well. Not currently on any inhalers. She would prefer not to be on them if at all possible. She has lots of surgeries planned potentially coming up as well as a second opinion at [facility] for her abdominal aneurysm per patient.  At this time she can follow-up with [place] in 6 months or as needed based on symptoms.  We appreciate the opportunity to help participate in the care of this patient.   Current Outpatient Medications:  .  acetaminophen (TYLENOL) 325 MG tablet, Take 325 mg by mouth every 6 (six) hours as needed (pain.)., Disp: , Rfl:  .  allopurinol (ZYLOPRIM) 100 MG tablet, TAKE 1 TABLET BY MOUTH  DAILY (Patient taking differently: Take 100 mg by mouth daily.), Disp: 90 tablet, Rfl: 3 .  amLODipine (NORVASC) 5 MG tablet, TAKE 1 TABLET BY MOUTH  DAILY (Patient taking differently: Take 5 mg by mouth daily.), Disp: 90 tablet, Rfl: 3 .  atorvastatin (LIPITOR) 40 MG tablet, TAKE 2 TABLETS BY MOUTH  DAILY (Patient taking differently: Take 80 mg by mouth at bedtime.), Disp: 180 tablet, Rfl: 3 .  Cholecalciferol (VITAMIN D3) 50 MCG  (2000 UT) TABS, Take 2,000 Units by mouth daily., Disp: , Rfl:  .  clopidogrel (PLAVIX) 75 MG tablet, TAKE 1 TABLET BY MOUTH  DAILY (Patient taking differently: Take 75 mg by mouth daily.), Disp: 90 tablet, Rfl: 3 .  furosemide (LASIX) 20 MG tablet, TAKE 1-2 TABLETS BY MOUTH  DAILY AS NEEDED FOR EDEMA., Disp: 180 tablet, Rfl: 1 .  isosorbide mononitrate (IMDUR) 30 MG 24 hr tablet, TAKE 1 TABLET BY MOUTH  DAILY (Patient taking differently: Take 30 mg by mouth daily.), Disp: 90 tablet, Rfl: 3 .  levothyroxine (SYNTHROID) 50 MCG tablet, TAKE 1 TABLET BY MOUTH  DAILY,  Disp: 90 tablet, Rfl: 0 .  losartan (COZAAR) 25 MG tablet, TAKE 1 TABLET BY MOUTH  DAILY (Patient taking differently: Take 25 mg by mouth daily.), Disp: 90 tablet, Rfl: 3 .  metoprolol tartrate (LOPRESSOR) 25 MG tablet, TAKE ONE-HALF TABLET BY  MOUTH TWICE DAILY (Patient taking differently: Take 12.5 mg by mouth 2 (two) times daily.), Disp: 90 tablet, Rfl: 3 .  Multiple Vitamins-Minerals (PRESERVISION/LUTEIN) CAPS, Take 1 capsule by mouth 3 (three) times a week. In the morning, Disp: , Rfl:  .  Naproxen Sodium (ALEVE PO), Take by mouth., Disp: , Rfl:  .  nitroGLYCERIN (NITROSTAT) 0.4 MG SL tablet, Place 0.4 mg under the tongue every 5 (five) minutes x 3 doses as needed for chest pain., Disp: , Rfl:  .  tiZANidine (ZANAFLEX) 4 MG tablet, Take 1 tablet (4 mg total) by mouth every 8 (eight) hours as needed for muscle spasms., Disp: 90 tablet, Rfl: 2 .  traMADol (ULTRAM) 50 MG tablet, Take 0.5-1 tablets (25-50 mg total) by mouth every 8 (eight) hours as needed., Disp: 100 tablet, Rfl: 2    Garner Nash, DO [facility] 07/13/2020 11:36 AM

## 2020-07-13 NOTE — Patient Instructions (Signed)
Thank you for visiting Dr. Valeta Harms at [facility]. Today we recommend the following:  Radiation oncology follow up already planned with SBRT next week.   Call us if your symptoms change or have increased breathing problems.   Return in about 6 months (around 01/10/2021) for with APP or Dr. Valeta Harms.    Please do your part to reduce the spread of COVID-19.

## 2020-07-17 ENCOUNTER — Ambulatory Visit
Admission: RE | Admit: 2020-07-17 | Discharge: 2020-07-17 | Disposition: A | Discharge status: Home / Self Care | Payer: Medicare Other | Source: Ambulatory Visit | Attending: Radiation Oncology | Admitting: Radiation Oncology

## 2020-07-17 ENCOUNTER — Other Ambulatory Visit: Payer: Self-pay

## 2020-07-17 DIAGNOSIS — Z510 Encounter for antineoplastic radiation therapy: Secondary | ICD-10-CM | POA: Diagnosis not present

## 2020-07-17 DIAGNOSIS — C3432 Malignant neoplasm of lower lobe, left bronchus or lung: Secondary | ICD-10-CM | POA: Diagnosis not present

## 2020-07-17 DIAGNOSIS — C3411 Malignant neoplasm of upper lobe, right bronchus or lung: Secondary | ICD-10-CM | POA: Diagnosis not present

## 2020-07-18 ENCOUNTER — Ambulatory Visit: Payer: Medicare Other | Admitting: Radiation Oncology

## 2020-07-19 ENCOUNTER — Other Ambulatory Visit: Payer: Self-pay

## 2020-07-19 ENCOUNTER — Ambulatory Visit
Admission: RE | Admit: 2020-07-19 | Discharge: 2020-07-19 | Disposition: A | Discharge status: Home / Self Care | Payer: Medicare Other | Source: Ambulatory Visit | Attending: Radiation Oncology | Admitting: Radiation Oncology

## 2020-07-19 DIAGNOSIS — Z510 Encounter for antineoplastic radiation therapy: Secondary | ICD-10-CM | POA: Diagnosis not present

## 2020-07-19 DIAGNOSIS — C3411 Malignant neoplasm of upper lobe, right bronchus or lung: Secondary | ICD-10-CM

## 2020-07-19 DIAGNOSIS — C3432 Malignant neoplasm of lower lobe, left bronchus or lung: Secondary | ICD-10-CM | POA: Diagnosis not present

## 2020-07-20 ENCOUNTER — Ambulatory Visit: Payer: Medicare Other | Admitting: Radiation Oncology

## 2020-07-21 ENCOUNTER — Other Ambulatory Visit: Payer: Self-pay

## 2020-07-21 ENCOUNTER — Ambulatory Visit
Admission: RE | Admit: 2020-07-21 | Discharge: 2020-07-21 | Disposition: A | Discharge status: Home / Self Care | Payer: Medicare Other | Source: Ambulatory Visit | Attending: Radiation Oncology | Admitting: Radiation Oncology

## 2020-07-21 DIAGNOSIS — C3411 Malignant neoplasm of upper lobe, right bronchus or lung: Secondary | ICD-10-CM | POA: Diagnosis not present

## 2020-07-21 DIAGNOSIS — Z510 Encounter for antineoplastic radiation therapy: Secondary | ICD-10-CM | POA: Diagnosis not present

## 2020-07-21 DIAGNOSIS — C3432 Malignant neoplasm of lower lobe, left bronchus or lung: Secondary | ICD-10-CM | POA: Diagnosis not present

## 2020-07-24 ENCOUNTER — Other Ambulatory Visit: Payer: Self-pay

## 2020-07-24 ENCOUNTER — Ambulatory Visit (INDEPENDENT_AMBULATORY_CARE_PROVIDER_SITE_OTHER): Payer: Medicare Other | Admitting: Internal Medicine

## 2020-07-24 ENCOUNTER — Encounter: Payer: Self-pay | Admitting: Internal Medicine

## 2020-07-24 VITALS — BP 142/76 | HR 67 | Temp 98.1°F | Ht 63.0 in | Wt 130.6 lb

## 2020-07-24 DIAGNOSIS — C3432 Malignant neoplasm of lower lobe, left bronchus or lung: Secondary | ICD-10-CM

## 2020-07-24 DIAGNOSIS — N182 Chronic kidney disease, stage 2 (mild): Secondary | ICD-10-CM

## 2020-07-24 DIAGNOSIS — Z0000 Encounter for general adult medical examination without abnormal findings: Secondary | ICD-10-CM

## 2020-07-24 DIAGNOSIS — F17200 Nicotine dependence, unspecified, uncomplicated: Secondary | ICD-10-CM

## 2020-07-24 DIAGNOSIS — E785 Hyperlipidemia, unspecified: Secondary | ICD-10-CM

## 2020-07-24 DIAGNOSIS — C3411 Malignant neoplasm of upper lobe, right bronchus or lung: Secondary | ICD-10-CM

## 2020-07-24 NOTE — Assessment & Plan Note (Signed)
CMET

## 2020-07-24 NOTE — Assessment & Plan Note (Signed)
Patient had navigational bronchoscopy to both right and left lung.  Diagnosed lung nodules as non-small cell lung cancer.  Both stage I disease.   Patient has been under SBRT treatments at present. No significant cough or shortness of breath. XRT smoker - 1/2 ppd

## 2020-07-24 NOTE — Assessment & Plan Note (Signed)

## 2020-07-24 NOTE — Progress Notes (Signed)
Subjective:  Patient ID: Tammy Brooks, female    DOB: 1934/04/17  Age: 87 y.o. MRN: [id number]  CC: Annual Exam   HPI ENSLEY BLAS presents for lung cancer - Patient had navigational bronchoscopy to both right and left lung.  Diagnosed lung nodules as non-small cell lung cancer.  Both stage I disease.   Patient has been under SBRT treatments at present. No significant cough or shortness of breath.  F/u COPD, smoker - 1/2 ppd; HTN  Well exam  Outpatient Medications Prior to Visit  Medication Sig Dispense Refill  . acetaminophen (TYLENOL) 325 MG tablet Take 325 mg by mouth every 6 (six) hours as needed (pain.).    Marland Kitchen allopurinol (ZYLOPRIM) 100 MG tablet TAKE 1 TABLET BY MOUTH  DAILY (Patient taking differently: Take 100 mg by mouth daily.) 90 tablet 3  . amLODipine (NORVASC) 5 MG tablet TAKE 1 TABLET BY MOUTH  DAILY (Patient taking differently: Take 5 mg by mouth daily.) 90 tablet 3  . atorvastatin (LIPITOR) 40 MG tablet TAKE 2 TABLETS BY MOUTH  DAILY (Patient taking differently: Take 80 mg by mouth at bedtime.) 180 tablet 3  . Cholecalciferol (VITAMIN D3) 50 MCG (2000 UT) TABS Take 2,000 Units by mouth daily.    . clopidogrel (PLAVIX) 75 MG tablet TAKE 1 TABLET BY MOUTH  DAILY (Patient taking differently: Take 75 mg by mouth daily.) 90 tablet 3  . furosemide (LASIX) 20 MG tablet TAKE 1-2 TABLETS BY MOUTH  DAILY AS NEEDED FOR EDEMA. 180 tablet 1  . isosorbide mononitrate (IMDUR) 30 MG 24 hr tablet TAKE 1 TABLET BY MOUTH  DAILY (Patient taking differently: Take 30 mg by mouth daily.) 90 tablet 3  . levothyroxine (SYNTHROID) 50 MCG tablet TAKE 1 TABLET BY MOUTH  DAILY 90 tablet 0  . losartan (COZAAR) 25 MG tablet TAKE 1 TABLET BY MOUTH  DAILY (Patient taking differently: Take 25 mg by mouth daily.) 90 tablet 3  . metoprolol tartrate (LOPRESSOR) 25 MG tablet TAKE ONE-HALF TABLET BY  MOUTH TWICE DAILY (Patient taking differently: Take 12.5 mg by mouth 2 (two) times daily.) 90 tablet 3  . Multiple  Vitamins-Minerals (PRESERVISION/LUTEIN) CAPS Take 1 capsule by mouth 3 (three) times a week. In the morning    . Naproxen Sodium (ALEVE PO) Take by mouth.    . nitroGLYCERIN (NITROSTAT) 0.4 MG SL tablet Place 0.4 mg under the tongue every 5 (five) minutes x 3 doses as needed for chest pain.    Marland Kitchen tiZANidine (ZANAFLEX) 4 MG tablet Take 1 tablet (4 mg total) by mouth every 8 (eight) hours as needed for muscle spasms. 90 tablet 2  . traMADol (ULTRAM) 50 MG tablet Take 0.5-1 tablets (25-50 mg total) by mouth every 8 (eight) hours as needed. 100 tablet 2   No facility-administered medications prior to visit.    ROS: Review of Systems  Constitutional: Negative for activity change, appetite change, chills, fatigue and unexpected weight change.  HENT: Negative for congestion, mouth sores and sinus pressure.   Eyes: Negative for visual disturbance.  Respiratory: Negative for cough, chest tightness and shortness of breath.   Gastrointestinal: Negative for abdominal pain and nausea.  Genitourinary: Negative for difficulty urinating, frequency and vaginal pain.  Musculoskeletal: Negative for back pain and gait problem.  Skin: Negative for pallor and rash.  Neurological: Negative for dizziness, tremors, weakness, numbness and headaches.  Hematological: Bruises/bleeds easily.  Psychiatric/Behavioral: Negative for confusion and sleep disturbance.    Objective:  BP (!) 142/76 (  BP Location: Left Arm)   Pulse 67   Temp 98.1 F (36.7 C) (Oral)   Ht 5\' 3"  (1.6 m)   Wt 130 lb 9.6 oz (59.2 kg)   LMP  (LMP Unknown)   SpO2 97%   BMI 23.13 kg/m   BP Readings from Last 3 Encounters:  07/24/20 (!) 142/76  07/13/20 124/64  06/29/20 (!) 147/70    Wt Readings from Last 3 Encounters:  07/24/20 130 lb 9.6 oz (59.2 kg)  07/13/20 128 lb (58.1 kg)  06/29/20 128 lb 3.2 oz (58.2 kg)    Physical Exam Constitutional:      General: She is not in acute distress.    Appearance: She is well-developed.  HENT:      Head: Normocephalic.     Right Ear: External ear normal.     Left Ear: External ear normal.     Nose: Nose normal.     Mouth/Throat:     Mouth: Oropharynx is clear and moist.  Eyes:     General:        Right eye: No discharge.        Left eye: No discharge.     Conjunctiva/sclera: Conjunctivae normal.     Pupils: Pupils are equal, round, and reactive to light.  Neck:     Thyroid: No thyromegaly.     Vascular: No JVD.     Trachea: No tracheal deviation.  Cardiovascular:     Rate and Rhythm: Normal rate and regular rhythm.     Heart sounds: Normal heart sounds.  Pulmonary:     Effort: No respiratory distress.     Breath sounds: No stridor. No wheezing.  Abdominal:     General: Bowel sounds are normal. There is no distension.     Palpations: Abdomen is soft. There is no mass.     Tenderness: There is no abdominal tenderness. There is no guarding or rebound.  Musculoskeletal:        General: No tenderness or edema.     Cervical back: Normal range of motion and neck supple.  Lymphadenopathy:     Cervical: No cervical adenopathy.  Skin:    Findings: Bruising present. No erythema or rash.  Neurological:     Cranial Nerves: No cranial nerve deficit.     Motor: No abnormal muscle tone.     Coordination: Coordination normal.     Deep Tendon Reflexes: Reflexes normal.  Psychiatric:        Mood and Affect: Mood and affect normal.        Behavior: Behavior normal.        Thought Content: Thought content normal.        Judgment: Judgment normal.     Lab Results  Component Value Date   WBC 7.2 06/13/2020   HGB 13.1 06/13/2020   HCT 43.8 06/13/2020   PLT 220 06/13/2020   GLUCOSE 80 06/13/2020   CHOL 122 07/12/2019   TRIG 94.0 07/12/2019   HDL 32.20 (L) 07/12/2019   LDLDIRECT 76.7 11/17/2008   LDLCALC 71 07/12/2019   ALT 14 05/18/2020   AST 24 05/18/2020   NA 139 06/13/2020   K 3.8 06/13/2020   CL 106 06/13/2020   CREATININE 1.00 06/13/2020   BUN 15 06/13/2020    CO2 24 06/13/2020   TSH 7.68 (H) 07/12/2019    No results found.  Assessment & Plan:   Walker Kehr, MD

## 2020-07-24 NOTE — Assessment & Plan Note (Addendum)
Patient had navigational bronchoscopy to both right and left lung.  Diagnosed lung nodules as non-small cell lung cancer.  Both stage I disease.   Patient has been under SBRT treatments at present. No significant cough or shortness of breath. XRT smoker - 1/2 ppd: discussed

## 2020-07-25 ENCOUNTER — Other Ambulatory Visit: Payer: Self-pay

## 2020-07-25 ENCOUNTER — Ambulatory Visit
Admission: RE | Admit: 2020-07-25 | Discharge: 2020-07-25 | Disposition: A | Discharge status: Home / Self Care | Payer: Medicare Other | Source: Ambulatory Visit | Attending: Radiation Oncology | Admitting: Radiation Oncology

## 2020-07-25 DIAGNOSIS — C3432 Malignant neoplasm of lower lobe, left bronchus or lung: Secondary | ICD-10-CM | POA: Diagnosis not present

## 2020-07-25 DIAGNOSIS — C3411 Malignant neoplasm of upper lobe, right bronchus or lung: Secondary | ICD-10-CM | POA: Diagnosis not present

## 2020-07-25 DIAGNOSIS — Z510 Encounter for antineoplastic radiation therapy: Secondary | ICD-10-CM | POA: Diagnosis not present

## 2020-07-27 ENCOUNTER — Encounter: Payer: Self-pay | Admitting: Radiation Oncology

## 2020-07-27 ENCOUNTER — Ambulatory Visit
Admission: RE | Admit: 2020-07-27 | Discharge: 2020-07-27 | Disposition: A | Discharge status: Home / Self Care | Payer: Medicare Other | Source: Ambulatory Visit | Attending: Radiation Oncology | Admitting: Radiation Oncology

## 2020-07-27 DIAGNOSIS — C3411 Malignant neoplasm of upper lobe, right bronchus or lung: Secondary | ICD-10-CM

## 2020-07-27 DIAGNOSIS — F17210 Nicotine dependence, cigarettes, uncomplicated: Secondary | ICD-10-CM | POA: Diagnosis not present

## 2020-07-27 DIAGNOSIS — Z510 Encounter for antineoplastic radiation therapy: Secondary | ICD-10-CM | POA: Diagnosis not present

## 2020-07-27 DIAGNOSIS — C3432 Malignant neoplasm of lower lobe, left bronchus or lung: Secondary | ICD-10-CM | POA: Diagnosis not present

## 2020-07-27 LAB — ACID FAST CULTURE WITH REFLEXED SENSITIVITIES (MYCOBACTERIA)
Acid Fast Culture: NEGATIVE
Acid Fast Culture: NEGATIVE

## 2020-08-03 ENCOUNTER — Ambulatory Visit (INDEPENDENT_AMBULATORY_CARE_PROVIDER_SITE_OTHER): Payer: Medicare Other

## 2020-08-03 DIAGNOSIS — Z0000 Encounter for general adult medical examination without abnormal findings: Secondary | ICD-10-CM

## 2020-08-03 NOTE — Patient Instructions (Signed)
Tammy Brooks , Thank you for taking time to come for your Medicare Wellness Visit. I appreciate your ongoing commitment to your health goals. Please review the following plan we discussed and let me know if I can assist you in the future.   Screening recommendations/referrals: Colonoscopy: 11/29/2010; no repeat due to age 87: 01/28/2019; due every 1-2 years Bone Density: 02/23/2019; due every 2 years Recommended yearly ophthalmology/optometry visit for glaucoma screening and checkup Recommended yearly dental visit for hygiene and checkup  Vaccinations: Influenza vaccine: 04/18/2020 Pneumococcal vaccine: 08/30/2014, 08/21/2017 Tdap vaccine: 12/30/2017 Shingles vaccine: never done   Covid-19: 08/07/2019, 09/04/2019, 04/18/2020  Advanced directives: Documents on file.  Conditions/risks identified: Yes; Reviewed health maintenance screenings with patient today and relevant education, vaccines, and/or referrals were provided. Please continue to do your personal lifestyle choices by: daily care of teeth and gums, regular physical activity (goal should be 5 days a week for 30 minutes), eat a healthy diet, avoid tobacco and drug use, limiting any alcohol intake, taking a low-dose aspirin (if not allergic or have been advised by your provider otherwise) and taking vitamins and minerals as recommended by your provider. Continue doing brain stimulating activities (puzzles, reading, adult coloring books, staying active) to keep memory sharp. Continue to eat heart healthy diet (full of fruits, vegetables, whole grains, lean protein, water--limit salt, fat, and sugar intake) and increase physical activity as tolerated.  Next appointment: Please schedule your next Medicare Wellness Visit with your Nurse Health Advisor in 1 year by calling [phone].   Preventive Care 85 Years and Older, Female Preventive care refers to lifestyle choices and visits with your health care provider that can promote health and  wellness. What does preventive care include?  A yearly physical exam. This is also called an annual well check.  Dental exams once or twice a year.  Routine eye exams. Ask your health care provider how often you should have your eyes checked.  Personal lifestyle choices, including:  Daily care of your teeth and gums.  Regular physical activity.  Eating a healthy diet.  Avoiding tobacco and drug use.  Limiting alcohol use.  Practicing safe sex.  Taking low-dose aspirin every day.  Taking vitamin and mineral supplements as recommended by your health care provider. What happens during an annual well check? The services and screenings done by your health care provider during your annual well check will depend on your age, overall health, lifestyle risk factors, and family history of disease. Counseling  Your health care provider may ask you questions about your:  Alcohol use.  Tobacco use.  Drug use.  Emotional well-being.  Home and relationship well-being.  Sexual activity.  Eating habits.  History of falls.  Memory and ability to understand (cognition).  Work and work Statistician.  Reproductive health. Screening  You may have the following tests or measurements:  Height, weight, and BMI.  Blood pressure.  Lipid and cholesterol levels. These may be checked every 5 years, or more frequently if you are over 55 years old.  Skin check.  Lung cancer screening. You may have this screening every year starting at age 37 if you have a 30-pack-year history of smoking and currently smoke or have quit within the past 15 years.  Fecal occult blood test (FOBT) of the stool. You may have this test every year starting at age 44.  Flexible sigmoidoscopy or colonoscopy. You may have a sigmoidoscopy every 5 years or a colonoscopy every 10 years starting at age 51.  Hepatitis  C blood test.  Hepatitis B blood test.  Sexually transmitted disease (STD)  testing.  Diabetes screening. This is done by checking your blood sugar (glucose) after you have not eaten for a while (fasting). You may have this done every 1-3 years.  Bone density scan. This is done to screen for osteoporosis. You may have this done starting at age 75.  Mammogram. This may be done every 1-2 years. Talk to your health care provider about how often you should have regular mammograms. Talk with your health care provider about your test results, treatment options, and if necessary, the need for more tests. Vaccines  Your health care provider may recommend certain vaccines, such as:  Influenza vaccine. This is recommended every year.  Tetanus, diphtheria, and acellular pertussis (Tdap, Td) vaccine. You may need a Td booster every 10 years.  Zoster vaccine. You may need this after age 59.  Pneumococcal 13-valent conjugate (PCV13) vaccine. One dose is recommended after age 69.  Pneumococcal polysaccharide (PPSV23) vaccine. One dose is recommended after age 11. Talk to your health care provider about which screenings and vaccines you need and how often you need them. This information is not intended to replace advice given to you by your health care provider. Make sure you discuss any questions you have with your health care provider. Document Released: 06/16/2015 Document Revised: 02/07/2016 Document Reviewed: 03/21/2015 Elsevier Interactive Patient Education  2017 [place] Prevention in the Home Falls can cause injuries. They can happen to people of all ages. There are many things you can do to make your home safe and to help prevent falls. What can I do on the outside of my home?  Regularly fix the edges of walkways and driveways and fix any cracks.  Remove anything that might make you trip as you walk through a door, such as a raised step or threshold.  Trim any bushes or trees on the path to your home.  Use bright outdoor lighting.  Clear any walking  paths of anything that might make someone trip, such as rocks or tools.  Regularly check to see if handrails are loose or broken. Make sure that both sides of any steps have handrails.  Any raised decks and porches should have guardrails on the edges.  Have any leaves, snow, or ice cleared regularly.  Use sand or salt on walking paths during winter.  Clean up any spills in your garage right away. This includes oil or grease spills. What can I do in the bathroom?  Use night lights.  Install grab bars by the toilet and in the tub and shower. Do not use towel bars as grab bars.  Use non-skid mats or decals in the tub or shower.  If you need to sit down in the shower, use a plastic, non-slip stool.  Keep the floor dry. Clean up any water that spills on the floor as soon as it happens.  Remove soap buildup in the tub or shower regularly.  Attach bath mats securely with double-sided non-slip rug tape.  Do not have throw rugs and other things on the floor that can make you trip. What can I do in the bedroom?  Use night lights.  Make sure that you have a light by your bed that is easy to reach.  Do not use any sheets or blankets that are too big for your bed. They should not hang down onto the floor.  Have a firm chair that has side arms.  You can use this for support while you get dressed.  Do not have throw rugs and other things on the floor that can make you trip. What can I do in the kitchen?  Clean up any spills right away.  Avoid walking on wet floors.  Keep items that you use a lot in easy-to-reach places.  If you need to reach something above you, use a strong step stool that has a grab bar.  Keep electrical cords out of the way.  Do not use floor polish or wax that makes floors slippery. If you must use wax, use non-skid floor wax.  Do not have throw rugs and other things on the floor that can make you trip. What can I do with my stairs?  Do not leave any items  on the stairs.  Make sure that there are handrails on both sides of the stairs and use them. Fix handrails that are broken or loose. Make sure that handrails are as long as the stairways.  Check any carpeting to make sure that it is firmly attached to the stairs. Fix any carpet that is loose or worn.  Avoid having throw rugs at the top or bottom of the stairs. If you do have throw rugs, attach them to the floor with carpet tape.  Make sure that you have a light switch at the top of the stairs and the bottom of the stairs. If you do not have them, ask someone to add them for you. What else can I do to help prevent falls?  Wear shoes that:  Do not have high heels.  Have rubber bottoms.  Are comfortable and fit you well.  Are closed at the toe. Do not wear sandals.  If you use a stepladder:  Make sure that it is fully opened. Do not climb a closed stepladder.  Make sure that both sides of the stepladder are locked into place.  Ask someone to hold it for you, if possible.  Clearly mark and make sure that you can see:  Any grab bars or handrails.  First and last steps.  Where the edge of each step is.  Use tools that help you move around (mobility aids) if they are needed. These include:  Canes.  Walkers.  Scooters.  Crutches.  Turn on the lights when you go into a dark area. Replace any light bulbs as soon as they burn out.  Set up your furniture so you have a clear path. Avoid moving your furniture around.  If any of your floors are uneven, fix them.  If there are any pets around you, be aware of where they are.  Review your medicines with your doctor. Some medicines can make you feel dizzy. This can increase your chance of falling. Ask your doctor what other things that you can do to help prevent falls. This information is not intended to replace advice given to you by your health care provider. Make sure you discuss any questions you have with your health care  provider. Document Released: 03/16/2009 Document Revised: 10/26/2015 Document Reviewed: 06/24/2014 Elsevier Interactive Patient Education  2017 [employer].

## 2020-08-03 NOTE — Progress Notes (Addendum)
I connected with Tammy Brooks today by telephone and verified that I am speaking with the correct person using two identifiers. Location patient: home Location provider: work Persons participating in the virtual visit: Shatavia Santor and Lisette Abu, LPN.   I discussed the limitations, risks, security and privacy concerns of performing an evaluation and management service by telephone and the availability of in person appointments. I also discussed with the patient that there may be a patient responsible charge related to this service. The patient expressed understanding and verbally consented to this telephonic visit.    Interactive audio and video telecommunications were attempted between this provider and patient, however failed, due to patient having technical difficulties OR patient did not have access to video capability.  We continued and completed visit with audio only.  Some vital signs may be absent or patient reported.   Time Spent with patient on telephone encounter: 30 minutes  Subjective:   Tammy Brooks is a 87 y.o. female who presents for Medicare Annual (Subsequent) preventive examination.  Review of Systems    No ROS. Medicare Wellness Visit. Additional risk factors are reflected in social history. Cardiac Risk Factors include: advanced age (>27men, >38 women);dyslipidemia;family history of premature cardiovascular disease;hypertension     Objective:    There were no vitals filed for this visit. There is no height or weight on file to calculate BMI.  Advanced Directives 08/03/2020 06/29/2020 01/06/2019 08/31/2018 02/10/2018 01/06/2018 07/15/2017  Does Patient Have a Medical Advance Directive? Yes Yes Yes Yes Yes Yes Yes  Type of Advance Directive Living will;Healthcare Power of Attorney Living will [place];Living will Living will;Healthcare Power of [place];Living will [place];Living will [place];Living will  Does patient want to make changes to medical advance directive? No - Patient declined - - - - - No - Patient declined  Copy of [place] in Chart? No - copy requested - No - copy requested - Yes - Yes  Would patient like information on creating a medical advance directive? - - - No - Patient declined - - -    Current Medications (verified) Outpatient Encounter Medications as of 08/03/2020  Medication Sig   acetaminophen (TYLENOL) 325 MG tablet Take 325 mg by mouth every 6 (six) hours as needed (pain.).   allopurinol (ZYLOPRIM) 100 MG tablet TAKE 1 TABLET BY MOUTH  DAILY (Patient taking differently: Take 100 mg by mouth daily.)   amLODipine (NORVASC) 5 MG tablet TAKE 1 TABLET BY MOUTH  DAILY (Patient taking differently: Take 5 mg by mouth daily.)   atorvastatin (LIPITOR) 40 MG tablet TAKE 2 TABLETS BY MOUTH  DAILY (Patient taking differently: Take 80 mg by mouth at bedtime.)   Cholecalciferol (VITAMIN D3) 50 MCG (2000 UT) TABS Take 2,000 Units by mouth daily.   clopidogrel (PLAVIX) 75 MG tablet TAKE 1 TABLET BY MOUTH  DAILY (Patient taking differently: Take 75 mg by mouth daily.)   furosemide (LASIX) 20 MG tablet TAKE 1-2 TABLETS BY MOUTH  DAILY AS NEEDED FOR EDEMA.   isosorbide mononitrate (IMDUR) 30 MG 24 hr tablet TAKE 1 TABLET BY MOUTH  DAILY (Patient taking differently: Take 30 mg by mouth daily.)   levothyroxine (SYNTHROID) 50 MCG tablet TAKE 1 TABLET BY MOUTH  DAILY   losartan (COZAAR) 25 MG tablet TAKE 1 TABLET BY MOUTH  DAILY (Patient taking differently: Take 25 mg by mouth daily.)   metoprolol tartrate (LOPRESSOR) 25 MG  tablet TAKE ONE-HALF TABLET BY  MOUTH TWICE DAILY (Patient taking differently: Take 12.5 mg by mouth 2 (two) times daily.)   Multiple Vitamins-Minerals (PRESERVISION/LUTEIN) CAPS Take 1 capsule by mouth 3 (three) times a week. In the morning   Naproxen Sodium (ALEVE PO) Take by mouth.   nitroGLYCERIN (NITROSTAT) 0.4 MG SL tablet  Place 0.4 mg under the tongue every 5 (five) minutes x 3 doses as needed for chest pain.   tiZANidine (ZANAFLEX) 4 MG tablet Take 1 tablet (4 mg total) by mouth every 8 (eight) hours as needed for muscle spasms.   traMADol (ULTRAM) 50 MG tablet Take 0.5-1 tablets (25-50 mg total) by mouth every 8 (eight) hours as needed.   No facility-administered encounter medications on file as of 08/03/2020.    Allergies (verified) Nabumetone and Tape   History: Past Medical History:  Diagnosis Date   Abdominal aortic aneurysm ([place])    followed by Dr. Sherren Mocha Early   Arthritis    hands   Blind left eye    CAD (coronary artery disease) 08/1995   50-55% second diag dz.; occluded OM   Cataract    Complication of anesthesia    "Blood pressure went down" with retinal surgery - no problems with previous surgeries   Echocardiogram abnormal 03/2012   mild to mod AS, valve area1.58 cm2, mild concentric LVH, grade I diastolic dusfunction   Gout    Heart murmur    Hyperlipidemia    Hypertension    Hypothyroidism    MI (myocardial infarction) ([place]) 01/25/2018   Myocardial infarction [facility])    mild/ age 45   Osteoporosis    PAD (peripheral artery disease) ([place])    mild to mod Rt ICA stenosis followed by doppler-02/05/12   Pelvic mass    Stroke ([place]) 2001   no residual problems   Thyroid disease    Tobacco abuse    Ulcer    Past Surgical History:  Procedure Laterality Date   ABDOMINAL HYSTERECTOMY     APPENDECTOMY     BRONCHIAL BIOPSY  06/13/2020   Procedure: BRONCHIAL BIOPSIES;  Surgeon: Garner Nash, DO;  Location: [place] [facility];  Service: Pulmonary;;   BRONCHIAL BRUSHINGS  06/13/2020   Procedure: BRONCHIAL BRUSHINGS;  Surgeon: Garner Nash, DO;  Location: [place] [facility];  Service: Pulmonary;;   BRONCHIAL NEEDLE ASPIRATION BIOPSY  06/13/2020   Procedure: BRONCHIAL NEEDLE ASPIRATION BIOPSIES;  Surgeon: Garner Nash, DO;  Location: [place] [facility];  Service: Pulmonary;;   BRONCHIAL WASHINGS   06/13/2020   Procedure: BRONCHIAL WASHINGS;  Surgeon: Garner Nash, DO;  Location: [place] [facility];  Service: Pulmonary;;   CARDIAC CATHETERIZATION  08/1995   50-55% second diag lesion, occluded OM   CARDIAC CATHETERIZATION  01/25/2018   results located in CE   CATARACT EXTRACTION W/ INTRAOCULAR LENS  IMPLANT, BILATERAL     COLONOSCOPY     EYE SURGERY Left    lense implant removed/ blind in left eye   FIDUCIAL MARKER PLACEMENT  06/13/2020   Procedure: FIDUCIAL MARKER PLACEMENT;  Surgeon: Garner Nash, DO;  Location: [facility];  Service: Pulmonary;;   LAPAROTOMY N/A 04/26/2014   Procedure: EXPLORATORY LAPAROTOMY/RESECTION OF PELVIC MASS;  Surgeon: Alvino Chapel, MD;  Location: [facility];  Service: Gynecology;  Laterality: N/A;   OOPHORECTOMY     and fallopian tube   POLYPECTOMY     RETINAL DETACHMENT SURGERY  2006   TONSILLECTOMY AND ADENOIDECTOMY     TUBAL LIGATION     UPPER  GASTROINTESTINAL ENDOSCOPY     treated for h-pylori   VIDEO BRONCHOSCOPY WITH ENDOBRONCHIAL NAVIGATION N/A 06/13/2020   Procedure: VIDEO BRONCHOSCOPY WITH ENDOBRONCHIAL NAVIGATION;  Surgeon: Garner Nash, DO;  Location: [facility];  Service: Pulmonary;  Laterality: N/A;   VIDEO BRONCHOSCOPY WITH ENDOBRONCHIAL ULTRASOUND  06/13/2020   Procedure: VIDEO BRONCHOSCOPY WITH ENDOBRONCHIAL ULTRASOUND;  Surgeon: Garner Nash, DO;  Location: [facility];  Service: Pulmonary;;   Family History  Problem Relation Age of Onset   Aneurysm Mother        AAA   Hypertension Mother    Hyperlipidemia Mother    Heart disease Father        CAD, MI x 3   Heart attack Father    Cancer Maternal Aunt        breast cancer   Diabetes Sister    Hyperlipidemia Sister    Hypertension Sister    Colon cancer Neg Hx    Stomach cancer Neg Hx    Social History   Socioeconomic History   Marital status: Widowed    Spouse name: Not on file   Number of children: 5   Years of education: 9   Highest education level:  9th grade  Occupational History   Occupation: retired    Comment: retired  Tobacco Use   Smoking status: Current Every Day Smoker    Packs/day: 0.50    Years: 68.00    Pack years: 34.00    Types: Cigarettes   Smokeless tobacco: Never Used  Scientific laboratory technician Use: Never used  Substance and Sexual Activity   Alcohol use: Not Currently    Alcohol/week: 0.0 standard drinks    Comment: occasionally - few drinks/month   Drug use: Never   Sexual activity: Not Currently    Partners: Male    Birth control/protection: Surgical    Comment: Hysterectomy  Other Topics Concern   Not on file  Social History Narrative   9th grade. married August 27, 2052- widowed Dec 10th'08. 4 sons - '55, '58, '60, '69 (two of these have passes away); 1 daughter - 08/27/2061 (deceased); 12 grandchildren; 30 great-grandchildren. 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 Other children are close and attentive. ACP/End-of-life: No CPR, no mechanical ventilation, no futile/heroic measures.   Social Determinants of Health   Financial Resource Strain: Low Risk    Difficulty of Paying Living Expenses: Not hard at all  Food Insecurity: No Food Insecurity   Worried About Charity fundraiser in the Last Year: Never true   [place] in the Last Year: Never true  Transportation Needs: No Transportation Needs   Lack of Transportation (Medical): No   Lack of Transportation (Non-Medical): No  Physical Activity: Sufficiently Active   Days of Exercise per Week: 5 days   Minutes of Exercise per Session: 30 min  Stress: No Stress Concern Present   Feeling of Stress : Not at all  Social Connections: Moderately Integrated   Frequency of Communication with Friends and Family: More than three times a week   Frequency of Social Gatherings with Friends and Family: More than three times a week   Attends Religious Services: More than 4 times per year   Active Member of Clubs or Organizations: No    Attends Music therapist: More than 4 times per year   Marital Status: Widowed    Tobacco Counseling Ready to quit: Not Answered Counseling given: Not  Answered   Clinical Intake:  Pre-visit preparation completed: Yes  Pain : No/denies pain     Nutritional Risks: None Diabetes: No  How often do you need to have someone help you when you read instructions, pamphlets, or other written materials from your doctor or pharmacy?: 1 - Never What is the last grade level you completed in school?: 9th grade  Diabetic? no  Interpreter Needed?: No  Information entered by :: Lisette Abu, LPN   Activities of Daily Living In your present state of health, do you have any difficulty performing the following activities: 08/03/2020  Hearing? N  Vision? N  Difficulty concentrating or making decisions? N  Walking or climbing stairs? N  Dressing or bathing? N  Doing errands, shopping? Y  Comment does not Physiological scientist and eating ? N  Using the Toilet? N  In the past six months, have you accidently leaked urine? N  Do you have problems with loss of bowel control? N  Managing your Medications? N  Managing your Finances? N  Housekeeping or managing your Housekeeping? N  Some recent data might be hidden    Patient Care Team: Plotnikov, Evie Lacks, MD as PCP - General (Internal Medicine) Lorretta Harp, MD as PCP - Cardiology (Cardiology) Bond, Tracie Harrier, MD as Referring Physician (Ophthalmology) Gerarda Fraction, MD as Referring Physician (Ophthalmology) Marti Sleigh, MD as Attending Physician (Gynecology) Early, Arvilla Meres, MD as Consulting Physician (Vascular Surgery) Ralene Ok, MD as Consulting Physician (General Surgery) Ilean China as Physician Assistant (Cardiology)  Indicate any recent Medical Services you may have received from other than [facility] providers in the past year (date may be approximate).     Assessment:   This is a  routine wellness examination for Tammy Brooks.  Hearing/Vision screen No exam data present  Dietary issues and exercise activities discussed: Current Exercise Habits: Home exercise routine, Type of exercise: walking, Time (Minutes): 30, Frequency (Times/Week): 5, Weekly Exercise (Minutes/Week): 150, Intensity: Moderate, Exercise limited by: respiratory conditions(s);cardiac condition(s);orthopedic condition(s)  Goals      Enjoy life, shop, travel, spend time with family and grandchildren     Patient Stated     Stay as healthy and as independent as possible. Maintain my current healthy status.        Depression Screen PHQ 2/9 Scores 08/03/2020 07/24/2020 01/12/2020 01/06/2019 02/04/2017 01/30/2016 08/30/2014  PHQ - 2 Score 0 0 0 0 0 0 0  PHQ- 9 Score - - - - 0 - -    Fall Risk Fall Risk  08/03/2020 07/24/2020 01/12/2020 01/06/2019 01/13/2018  Falls in the past year? 0 - 0 0 No  Number falls in past yr: 0 1 0 0 -  Injury with Fall? 0 1 0 0 -  Risk for fall due to : No Fall Risks History of fall(s) - - -  Follow up Falls evaluation completed - - - -    FALL RISK PREVENTION PERTAINING TO THE HOME:  Any stairs in or around the home? No  If so, are there any without handrails? No  Home free of loose throw rugs in walkways, pet beds, electrical cords, etc? Yes  Adequate lighting in your home to reduce risk of falls? Yes   ASSISTIVE DEVICES UTILIZED TO PREVENT FALLS:  Life alert? No  Use of a cane, walker or w/c? No  Grab bars in the bathroom? No  Shower chair or bench in shower? No  Elevated toilet seat or a handicapped toilet? No  TIMED UP AND GO:  Was the test performed? No .  Length of time to ambulate 10 feet: 0 sec.   Gait steady and fast without use of assistive device  Cognitive Function: MMSE - Mini Mental State Exam 02/04/2017  Orientation to time 5  Orientation to Place 5  Registration 3  Attention/ Calculation 4  Recall 1  Language- name 2 objects 2  Language- repeat 1   Language- follow 3 step command 3  Language- read & follow direction 1  Write a sentence 1  Copy design 1  Total score 27     6CIT Screen 01/06/2019  What Year? 0 points  What month? 0 points  What time? 0 points  Count back from 20 0 points  Months in reverse 2 points  Repeat phrase 0 points  Total Score 2    Immunizations Immunization History  Administered Date(s) Administered   Fluad Quad(high Dose 65+) 02/23/2019   Influenza Split 03/18/2012   Influenza Whole 03/04/2009   Influenza, High Dose Seasonal PF 02/04/2017, 03/11/2018   Influenza,inj,Quad PF,6+ Mos 04/13/2014, 01/30/2016   Influenza-Unspecified 03/03/2013, 05/09/2015, 04/18/2020   Moderna Sars-Covid-2 Vaccination 08/07/2019, 09/04/2019, 04/18/2020   Pneumococcal Conjugate-13 08/30/2014   Pneumococcal Polysaccharide-23 11/17/2008, 08/21/2017   Td 11/17/2008   Tdap 12/30/2017   Zoster 11/21/2009    TDAP status: Up to date  Flu Vaccine status: Up to date  Pneumococcal vaccine status: Up to date  Covid-19 vaccine status: Completed vaccines  Qualifies for Shingles Vaccine? Yes   Zostavax completed Yes   Shingrix Completed?: No.    Education has been provided regarding the importance of this vaccine. Patient has been advised to call insurance company to determine out of pocket expense if they have not yet received this vaccine. Advised may also receive vaccine at local pharmacy or Health Dept. Verbalized acceptance and understanding.  Screening Tests Health Maintenance  Topic Date Due   COVID-19 Vaccine (4 - Booster for Moderna series) 10/16/2020   TETANUS/TDAP  12/31/2027   INFLUENZA VACCINE  Completed   DEXA SCAN  Completed   PNA vac Low Risk Adult  Completed   HPV VACCINES  Aged Out    Health Maintenance  There are no preventive care reminders to display for this patient.  Colorectal cancer screening: No longer required.   Mammogram status: Completed 01/28/2019. Repeat every year  Bone Density  status: Completed 02/23/2019. Results reflect: Bone density results: OSTEOPENIA. Repeat every 2 years.  Lung Cancer Screening: (Low Dose CT Chest recommended if Age 58-80 years, 30 pack-year currently smoking OR have quit w/in 15years.) does qualify.   Lung Cancer Screening Referral: no  Additional Screening:  Hepatitis C Screening: does not qualify; Completed no  Vision Screening: Recommended annual ophthalmology exams for early detection of glaucoma and other disorders of the eye. Is the patient up to date with their annual eye exam?  Yes  Who is the provider or what is the name of the office in which the patient attends annual eye exams? Raynelle Fanning, MD, Gerarda Fraction, MD If pt is not established with a provider, would they like to be referred to a provider to establish care? No .   Dental Screening: Recommended annual dental exams for proper oral hygiene  Community Resource Referral / Chronic Care Management: CRR required this visit?  No   CCM required this visit?  No      Plan:     I have personally reviewed and noted the following in the patient's chart:  Medical and social history Use of alcohol, tobacco or illicit drugs  Current medications and supplements Functional ability and status Nutritional status Physical activity Advanced directives List of other physicians Hospitalizations, surgeries, and ER visits in previous 12 months Vitals Screenings to include cognitive, depression, and falls Referrals and appointments  In addition, I have reviewed and discussed with patient certain preventive protocols, quality metrics, and best practice recommendations. A written personalized care plan for preventive services as well as general preventive health recommendations were provided to patient.     Sheral Flow, LPN   09/08/5925    Nurse Notes: Patient is cogitatively intact. There were no vitals filed for this visit. There is no height or weight on file to  calculate BMI. Patient stated that she has no issues with gait or balance; does not use any assistive devices. Medications reviewed with patient; no opioid use noted.    Medical screening examination/treatment/procedure(s) were performed by non-physician practitioner and as supervising physician I was immediately available for consultation/collaboration.  I agree with above. [name], MD

## 2020-08-14 ENCOUNTER — Other Ambulatory Visit: Payer: Self-pay | Admitting: Cardiovascular Disease

## 2020-08-24 ENCOUNTER — Encounter: Payer: Self-pay | Admitting: Radiation Oncology

## 2020-08-31 ENCOUNTER — Encounter: Payer: Self-pay | Admitting: Radiation Oncology

## 2020-08-31 ENCOUNTER — Other Ambulatory Visit: Payer: Self-pay

## 2020-08-31 ENCOUNTER — Other Ambulatory Visit: Payer: Self-pay | Admitting: Internal Medicine

## 2020-08-31 ENCOUNTER — Ambulatory Visit
Admission: RE | Admit: 2020-08-31 | Discharge: 2020-08-31 | Disposition: A | Discharge status: Home / Self Care | Payer: Medicare Other | Source: Ambulatory Visit | Attending: Radiation Oncology | Admitting: Radiation Oncology

## 2020-08-31 VITALS — BP 121/67 | HR 73 | Temp 97.8°F | Resp 18 | Ht 61.7 in | Wt 128.4 lb

## 2020-08-31 DIAGNOSIS — Z923 Personal history of irradiation: Secondary | ICD-10-CM | POA: Diagnosis not present

## 2020-08-31 DIAGNOSIS — C3411 Malignant neoplasm of upper lobe, right bronchus or lung: Secondary | ICD-10-CM | POA: Insufficient documentation

## 2020-08-31 DIAGNOSIS — Z79899 Other long term (current) drug therapy: Secondary | ICD-10-CM | POA: Insufficient documentation

## 2020-08-31 DIAGNOSIS — R918 Other nonspecific abnormal finding of lung field: Secondary | ICD-10-CM

## 2020-08-31 DIAGNOSIS — C3432 Malignant neoplasm of lower lobe, left bronchus or lung: Secondary | ICD-10-CM | POA: Diagnosis not present

## 2020-08-31 HISTORY — DX: Personal history of irradiation: Z92.3

## 2020-08-31 NOTE — Progress Notes (Signed)
Radiation Oncology         [phone] ________________________________  Name: Tammy Brooks MRN: [id number]  Date: 08/31/2020  DOB: 11/17/1933  Follow-Up Visit Note  CC: Plotnikov, Evie Lacks, MD  Garner Nash, DO    ICD-10-CM   1. Primary cancer of left lower lobe of lung (HCC)  C34.32 CT CHEST WO CONTRAST  2. Lung nodules  R91.8   3. 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 No significant interval history since the end of treatment.  On review of systems, she reports no residual fatigue. She denies cough or hemoptysis.  She denies any pain within the chest area..                              ALLERGIES:  is allergic to nabumetone and tape.  Meds: Current Outpatient Medications  Medication Sig Dispense Refill  . acetaminophen (TYLENOL) 325 MG tablet Take 325 mg by mouth every 6 (six) hours as needed (pain.).    Marland Kitchen allopurinol (ZYLOPRIM) 100 MG tablet TAKE 1 TABLET BY MOUTH  DAILY (Patient taking differently: Take 100 mg by mouth daily.) 90 tablet 3  . amLODipine (NORVASC) 5 MG tablet TAKE 1 TABLET BY MOUTH  DAILY 90 tablet 3  . atorvastatin (LIPITOR) 40 MG tablet TAKE 2 TABLETS BY MOUTH  DAILY (Patient taking differently: Take 80 mg by mouth at bedtime.) 180 tablet 3  . Cholecalciferol (VITAMIN D3) 50 MCG (2000 UT) TABS Take 2,000 Units by mouth daily.    . clopidogrel (PLAVIX) 75 MG tablet TAKE 1 TABLET BY MOUTH  DAILY (Patient taking differently: Take 75 mg by mouth daily.) 90 tablet 3  .  furosemide (LASIX) 20 MG tablet TAKE 1-2 TABLETS BY MOUTH  DAILY AS NEEDED FOR EDEMA. 180 tablet 1  . isosorbide mononitrate (IMDUR) 30 MG 24 hr tablet TAKE 1 TABLET BY MOUTH  DAILY (Patient taking differently: Take 30 mg by mouth daily.) 90 tablet 3  . levothyroxine (SYNTHROID) 50 MCG tablet TAKE 1 TABLET BY MOUTH  DAILY 90 tablet 0  . losartan (COZAAR) 25 MG tablet TAKE 1 TABLET BY MOUTH  DAILY 90 tablet 3  . metoprolol tartrate (LOPRESSOR) 25 MG tablet TAKE ONE-HALF TABLET BY  MOUTH TWICE DAILY (Patient taking differently: Take 12.5 mg by mouth 2 (two) times daily.) 90 tablet 3  . Multiple Vitamins-Minerals (PRESERVISION/LUTEIN) CAPS Take 1 capsule by mouth 3 (three) times a week. In the morning    . Naproxen Sodium (ALEVE PO) Take by mouth. (Patient not taking: Reported on 08/31/2020)    . nitroGLYCERIN (NITROSTAT) 0.4 MG SL tablet Place 0.4 mg under the tongue every 5 (five) minutes x 3 doses as needed for chest pain. (Patient not taking: Reported on 08/31/2020)    . tiZANidine (ZANAFLEX) 4 MG tablet Take 1 tablet (4 mg total) by mouth every 8 (eight) hours as needed for muscle spasms. (Patient not  taking: Reported on 08/31/2020) 90 tablet 2  . traMADol (ULTRAM) 50 MG tablet Take 0.5-1 tablets (25-50 mg total) by mouth every 8 (eight) hours as needed. (Patient not taking: Reported on 08/31/2020) 100 tablet 2   No current facility-administered medications for this encounter.    Physical Findings: The patient is in no acute distress. Patient is alert and oriented.  height is 5' 1.7" (1.567 m) and weight is 128 lb 6.4 oz (58.2 kg). Her temperature is 97.8 F (36.6 C). Her blood pressure is 121/67 and her pulse is 73. Her respiration is 18 and oxygen saturation is 98%.   Lungs are clear to auscultation bilaterally. Heart has regular rate and rhythm. No palpable cervical, supraclavicular, or axillary adenopathy. Abdomen soft, non-tender, normal bowel sounds.   Lab Findings: Lab Results  Component  Value Date   WBC 7.2 06/13/2020   HGB 13.1 06/13/2020   HCT 43.8 06/13/2020   MCV 90.7 06/13/2020   PLT 220 06/13/2020    Radiographic Findings: No results found.  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  Plan: The patient will follow-up with radiation oncology in three months. She will undergo a repeat chest CT scan prior to that visit.   ____________________________________   Blair Promise, PhD, MD  This document serves as a record of services personally performed by Gery Pray, MD. It was created on his behalf by Clerance Lav, a trained medical scribe. The creation of this record is based on the scribe's personal observations and the provider's statements to them. This document has been checked and approved by the attending provider.

## 2020-08-31 NOTE — Progress Notes (Signed)
Tammy Brooks is here today for follow up post radiation to the lung.  Lung Side: right  Completed radiation 07/27/2020  Does the patient complain of any of the following: . Pain:no . Shortness of breath w/wo exertion:  no . Cough: no . Hemoptysis: no . Pain with swallowing: no . Swallowing/choking concerns: no . Appetite: Fairly good . Energy Level: good . Post radiation skin Changes: no    Additional comments if applicable:  Na  Vitals:   08/31/20 0944  BP: 121/67  Pulse: 73  Resp: 18  Temp: 97.8 F (36.6 C)  SpO2: 98%  Weight: 128 lb 6.4 oz (58.2 kg)  Height: 5' 1.7" (1.567 m)

## 2020-09-22 ENCOUNTER — Other Ambulatory Visit: Payer: Self-pay | Admitting: Internal Medicine

## 2020-10-27 ENCOUNTER — Other Ambulatory Visit: Payer: Self-pay | Admitting: Internal Medicine

## 2020-11-30 ENCOUNTER — Ambulatory Visit (HOSPITAL_COMMUNITY)
Admission: RE | Admit: 2020-11-30 | Discharge: 2020-11-30 | Disposition: A | Discharge status: Home / Self Care | Payer: Medicare Other | Source: Ambulatory Visit | Attending: Radiation Oncology | Admitting: Radiation Oncology

## 2020-11-30 ENCOUNTER — Other Ambulatory Visit: Payer: Self-pay

## 2020-11-30 DIAGNOSIS — C3411 Malignant neoplasm of upper lobe, right bronchus or lung: Secondary | ICD-10-CM | POA: Diagnosis not present

## 2020-11-30 DIAGNOSIS — I700 Atherosclerosis of aorta: Secondary | ICD-10-CM | POA: Diagnosis not present

## 2020-11-30 DIAGNOSIS — R918 Other nonspecific abnormal finding of lung field: Secondary | ICD-10-CM | POA: Diagnosis not present

## 2020-11-30 DIAGNOSIS — C3432 Malignant neoplasm of lower lobe, left bronchus or lung: Secondary | ICD-10-CM | POA: Diagnosis not present

## 2020-11-30 DIAGNOSIS — R911 Solitary pulmonary nodule: Secondary | ICD-10-CM | POA: Diagnosis not present

## 2020-11-30 DIAGNOSIS — C3490 Malignant neoplasm of unspecified part of unspecified bronchus or lung: Secondary | ICD-10-CM | POA: Diagnosis not present

## 2020-11-30 DIAGNOSIS — J439 Emphysema, unspecified: Secondary | ICD-10-CM | POA: Diagnosis not present

## 2020-12-06 NOTE — Progress Notes (Signed)
Radiation Oncology         [phone] ________________________________  Name: Tammy Brooks MRN: [id number]  Date: 12/07/2020  DOB: 1934-05-10  Follow-Up Visit Note  CC: Plotnikov, Evie Lacks, MD  Garner Nash, DO    ICD-10-CM   1. Non-small cell lung cancer, unspecified laterality ([place])  C34.90     2. Primary cancer of right upper lobe of lung (HCC)  C34.11     3. 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 Pertinent imaging since the patient was last seen includes a chest CT received on 11/30/20 which showed an interval decrease in the size of the irregular right upper lobe nodule with adjacent fiducial markers. Also seen were several new tiny bilateral pulmonary nodules measuring up to 6 mm, as well as decrease in the size of the anterior left lower lobe pulmonary nodule.     Of note: chest CT did also show abdominal aortic aneurysm; measuring at least 6.0 cm diameter (although incompletely visualized).         She does not report any pulmonary symptoms.  Specifically she denies any significant cough or hemoptysis or worsening of her breathing.  She does complain of poor energy level which has been chronic and slowly worsening as she ages.                     Allergies:  is allergic to nabumetone and tape.  Meds: Current Outpatient Medications  Medication Sig Dispense Refill   acetaminophen (TYLENOL) 325 MG tablet Take 325 mg by mouth every 6 (six) hours as needed (pain.).      allopurinol (ZYLOPRIM) 100 MG tablet TAKE 1 TABLET BY MOUTH  DAILY 90 tablet 3   amLODipine (NORVASC) 5 MG tablet TAKE 1 TABLET BY MOUTH  DAILY 90 tablet 3   atorvastatin (LIPITOR) 40 MG tablet TAKE 2 TABLETS BY MOUTH  DAILY (Patient taking differently: Take 80 mg by mouth at bedtime.) 180 tablet 3   Cholecalciferol (VITAMIN D3) 50 MCG (2000 UT) TABS Take 2,000 Units by mouth daily.     clopidogrel (PLAVIX) 75 MG tablet TAKE 1 TABLET BY MOUTH  DAILY 90 tablet 3   furosemide (LASIX) 20 MG tablet TAKE 1-2 TABLETS BY MOUTH  DAILY AS NEEDED FOR EDEMA. 180 tablet 1   isosorbide mononitrate (IMDUR) 30 MG 24 hr tablet TAKE 1 TABLET BY MOUTH  DAILY (Patient taking differently: Take 30 mg by mouth daily.) 90 tablet 3   levothyroxine (SYNTHROID) 50 MCG tablet TAKE 1 TABLET BY MOUTH  DAILY 90 tablet 3   losartan (COZAAR) 25 MG tablet TAKE 1 TABLET BY MOUTH  DAILY 90 tablet 3   metoprolol tartrate (LOPRESSOR) 25 MG tablet TAKE ONE-HALF TABLET BY  MOUTH TWICE DAILY (Patient taking differently: Take 12.5 mg by mouth 2 (two) times daily.) 90 tablet 3   Multiple Vitamins-Minerals (PRESERVISION/LUTEIN) CAPS Take 1  capsule by mouth 3 (three) times a week. In the morning     Naproxen Sodium (ALEVE PO) Take by mouth. (Patient not taking: Reported on 08/31/2020)     nitroGLYCERIN (NITROSTAT) 0.4 MG SL tablet Place 0.4 mg under the tongue every 5 (five) minutes x 3 doses as needed for chest pain. (Patient not taking: Reported on 08/31/2020)     tiZANidine (ZANAFLEX) 4 MG tablet Take 1 tablet (4 mg total) by mouth every 8 (eight) hours as needed for muscle spasms. (Patient not taking: Reported on 08/31/2020) 90 tablet 2   traMADol (ULTRAM) 50 MG tablet Take 0.5-1 tablets (25-50 mg total) by mouth every 8 (eight) hours as needed. (Patient not taking: Reported on 08/31/2020) 100 tablet 2   No current facility-administered medications for this encounter.    Physical Findings: The patient is in no acute distress. Patient  is alert and oriented.  height is 5\' 1"  (1.549 m) and weight is 127 lb 6.4 oz (57.8 kg). Her temperature is 97.6 F (36.4 C). Her blood pressure is 130/68 and her pulse is 60. Her respiration is 18 and oxygen saturation is 99%. .  No significant changes. Lungs are clear to auscultation bilaterally. Heart has regular rate and rhythm. No palpable cervical, supraclavicular, or axillary adenopathy. Abdomen soft, non-tender, normal bowel sounds.    Lab Findings: Lab Results  Component Value Date   WBC 7.2 06/13/2020   HGB 13.1 06/13/2020   HCT 43.8 06/13/2020   MCV 90.7 06/13/2020   PLT 220 06/13/2020    Radiographic Findings: CT CHEST WO CONTRAST  Result Date: 12/02/2020 CLINICAL DATA:  Non-small-cell lung cancer. EXAM: CT CHEST WITHOUT CONTRAST TECHNIQUE: Multidetector CT imaging of the chest was performed following the standard protocol without IV contrast. COMPARISON:  06/08/2020 FINDINGS: Cardiovascular: The heart size is normal. No substantial pericardial effusion. Coronary artery calcification is evident. Atherosclerotic calcification is noted in the wall of the thoracic aorta. Mediastinum/Nodes: No mediastinal lymphadenopathy. No evidence for gross hilar lymphadenopathy although assessment is limited by the lack of intravenous contrast on today's study. The esophagus has normal imaging features. There is no axillary lymphadenopathy. Lungs/Pleura: Centrilobular emphsyema noted. Irregular right upper lobe pulmonary nodule with adjacent fiducial markers noted. Lesion measures minimally smaller at 1.8 x 1.2 cm today compared to 2.2 x 1.4 cm previously. 3 mm right upper lobe nodule on 65/5 is new in the interval. 2 mm peripheral right upper lobe nodule on 65/5 is also new. 4 mm posterior right upper lobe nodule on 63/5 is also new. 6 mm peripheral left upper lobe nodule on image 30/5 is new since prior. 5 mm left lower lobe nodule on 55/5 was described previously and is unchanged. Previously noted 6  mm left lower lobe nodule on 99/5 is stable as is the tiny right middle lobe nodule on 97/5. 2.3 x 1.3 cm lesion in the anterior left lower lobe just posterior to the fissure is decreased in the interval measuring 1.1 x 1.0 cm today. Upper Abdomen: Abdominal aortic aneurysm is been incompletely visualized but measures at least 6.0 cm diameter on image 167/2. Musculoskeletal: No worrisome lytic or sclerotic osseous abnormality. IMPRESSION: 1. Interval decrease in size of the irregular right upper lobe pulmonary nodule with adjacent fiducial markers. 2. Several new tiny bilateral pulmonary nodules measuring up to 6 mm. Close attention on follow-up recommended as metastatic disease not excluded. 3. Interval decrease in size of the anterior left lower lobe pulmonary nodule. 4. Abdominal aortic aneurysm, incompletely visualized but  measuring at least 6.0 cm diameter. 5. Aortic Atherosclerosis (ICD10-I70.0) and Emphysema (ICD10-J43.9). Electronically Signed   By: Misty Stanley M.D.   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  As above.  Questionable new pulmonary nodules and in light of this we will order a chest CT scan at 104-month interval.  Clinically she is doing well without any recurrence on exam today.  Plan: Follow-up in 3 months with chest CT scan prior to this visit.   20 minutes of total time was spent for this patient encounter, including preparation, face-to-face counseling with the patient and coordination of care, physical exam, and documentation of the encounter. ____________________________________  Blair Promise, PhD, MD   This document serves as a record of services personally performed by Gery Pray, MD. It was created on his behalf by Roney Mans, a trained medical scribe. The creation of this record is based on the scribe's personal  observations and the provider's statements to them. This document has been checked and approved by the attending provider.

## 2020-12-07 ENCOUNTER — Ambulatory Visit
Admission: RE | Admit: 2020-12-07 | Discharge: 2020-12-07 | Disposition: A | Discharge status: Home / Self Care | Payer: Medicare Other | Source: Ambulatory Visit | Attending: Radiation Oncology | Admitting: Radiation Oncology

## 2020-12-07 ENCOUNTER — Other Ambulatory Visit: Payer: Self-pay

## 2020-12-07 VITALS — BP 130/68 | HR 60 | Temp 97.6°F | Resp 18 | Ht 61.0 in | Wt 127.4 lb

## 2020-12-07 DIAGNOSIS — I714 Abdominal aortic aneurysm, without rupture: Secondary | ICD-10-CM | POA: Diagnosis not present

## 2020-12-07 DIAGNOSIS — C3432 Malignant neoplasm of lower lobe, left bronchus or lung: Secondary | ICD-10-CM

## 2020-12-07 DIAGNOSIS — C3411 Malignant neoplasm of upper lobe, right bronchus or lung: Secondary | ICD-10-CM | POA: Diagnosis not present

## 2020-12-07 DIAGNOSIS — C3490 Malignant neoplasm of unspecified part of unspecified bronchus or lung: Secondary | ICD-10-CM

## 2020-12-07 DIAGNOSIS — Z79899 Other long term (current) drug therapy: Secondary | ICD-10-CM | POA: Insufficient documentation

## 2020-12-07 DIAGNOSIS — Z08 Encounter for follow-up examination after completed treatment for malignant neoplasm: Secondary | ICD-10-CM | POA: Diagnosis not present

## 2020-12-07 NOTE — Progress Notes (Signed)
FILIPPA YARBOUGH is here today for follow up post radiation to the lung.  Lung Side: right  Does the patient complain of any of the following: Pain:denies Shortness of breath w/wo exertion: denies Cough: denies Hemoptysis: denies Pain with swallowing: denies Swallowing/choking concerns: denies Appetite: poor Energy Level: poor and worsening Post radiation skin Changes: denies itching, burning peeling. Skin intact. No visible skin changes noted.    Additional comments if applicable: none   Vitals:   12/07/20 1100  BP: 130/68  Pulse: 60  Resp: 18  Temp: 97.6 F (36.4 C)  SpO2: 99%  Weight: 127 lb 6.4 oz (57.8 kg)  Height: 5\' 1"  (1.549 m)

## 2020-12-29 ENCOUNTER — Telehealth: Payer: Self-pay | Admitting: Internal Medicine

## 2020-12-29 NOTE — Chronic Care Management (AMB) (Signed)
  Chronic Care Management   Outreach Note  12/29/2020 Name: Tammy Brooks MRN: [id number] DOB: 1933-10-08  Referred by: Cassandria Anger, MD Reason for referral : No chief complaint on file.   An unsuccessful telephone outreach was attempted today. The patient was referred to the pharmacist for assistance with care management and care coordination.   Follow Up Plan:   Lauretta Grill Upstream Scheduler

## 2021-01-01 NOTE — Telephone Encounter (Signed)
° ° °  Please return call to patient °

## 2021-01-10 ENCOUNTER — Ambulatory Visit (HOSPITAL_COMMUNITY): Payer: Medicare Other | Attending: Cardiovascular Disease

## 2021-01-10 ENCOUNTER — Encounter: Payer: Self-pay | Admitting: Primary Care

## 2021-01-10 ENCOUNTER — Ambulatory Visit: Payer: Medicare Other | Admitting: Primary Care

## 2021-01-10 ENCOUNTER — Other Ambulatory Visit: Payer: Self-pay

## 2021-01-10 DIAGNOSIS — I10 Essential (primary) hypertension: Secondary | ICD-10-CM | POA: Diagnosis not present

## 2021-01-10 DIAGNOSIS — F17200 Nicotine dependence, unspecified, uncomplicated: Secondary | ICD-10-CM | POA: Diagnosis not present

## 2021-01-10 DIAGNOSIS — E785 Hyperlipidemia, unspecified: Secondary | ICD-10-CM | POA: Diagnosis not present

## 2021-01-10 DIAGNOSIS — C3490 Malignant neoplasm of unspecified part of unspecified bronchus or lung: Secondary | ICD-10-CM | POA: Diagnosis not present

## 2021-01-10 LAB — ECHOCARDIOGRAM COMPLETE
AR max vel: 0.6 cm2
AV Area VTI: 0.54 cm2
AV Area mean vel: 0.54 cm2
AV Mean grad: 39.5 mmHg
AV Peak grad: 64 mmHg
Ao pk vel: 4 m/s
Area-P 1/2: 1.65 cm2
P 1/2 time: 397 msec
S' Lateral: 3.5 cm

## 2021-01-10 NOTE — Assessment & Plan Note (Signed)
-   Current smoker, 1/2 ppd. She is not ready to quit. Encourage she taper amount she is smoking and consider picking quit date.

## 2021-01-10 NOTE — Progress Notes (Signed)
@Patient  ID: Tammy Brooks, female    DOB: [birth year], 87 y.o.   MRN: [id number]  Chief Complaint  Patient presents with   Follow-up    Patient does not have any concerns.     Referring provider: Plotnikov, Evie Lacks, MD  HPI: 87 year old female, current everyday smoker.  Past medical history significant for non-small cell lung cancer stage I, coronary artery disease, peripheral vascular disease, abdominal aortic aneurysm.  Patient of Dr. Valeta Harms, last seen in office on 07/13/2020 for lung nodule consult.   She was found to have a lung nodule within left lower lobe lung during work up of abdominal aneurysm. She was also found to have another lung nodule in RUL on CT imaging. Both were diagnosed as non-small cell lung cancer via navigation bronchoscopy and fiducial placement. Patient was referred to radiation oncology for SBRT.   01/10/2021 Patient presents today for 6 month follow-up. She is s/p 5 radiation treatments, last treatment was at the end of February 2022. She saw Dr. Sondra Come with radiation/oncology on 12/07/20. She had CT on 11/30/20 that showed interval decrease in size irregular right upper lobe nodules with adjacent fidual markers. She had several new tiny bilateral pulmonary measuring up to 3mm as well as decrease in size anterior left lower lobe pulmonary nodules. Abdominal aortic aneurysm measuring 6cm. Plan follow-up with CT chest in 3 months (September-October 2022).   She is feeling alright, her breathing is described as good. She has no respiratory complaints. She has not been noticing any shortness of breath. She gets minimal activity d/t hip pain. She is able to do all her ADLs. She does not get out of breath walking to the mailbox. She has no cough. She is not on maintenance inhalers and would prefer not to be on them if possible. No formal PFTs on file. She is still smoking but has cut back to 1/2 pack a day. She is not ready to quit.    Allergies  Allergen Reactions    Nabumetone Anaphylaxis, Swelling and Other (See Comments)    Causes inflammation of airways   Tape Other (See Comments)    SKIN IS VERY THIN AND TEARS EASILY- PLEASE USE AN ALTERNATIVE TO TAPE, IF POSSIBLE!!    Immunization History  Administered Date(s) Administered   Fluad Quad(high Dose 65+) 02/23/2019   Influenza Split 03/18/2012   Influenza Whole 03/04/2009   Influenza, High Dose Seasonal PF 02/04/2017, 03/11/2018   Influenza,inj,Quad PF,6+ Mos 04/13/2014, 01/30/2016   Influenza-Unspecified 03/03/2013, 05/09/2015, 04/18/2020   Moderna Sars-Covid-2 Vaccination 08/07/2019, 09/04/2019, 04/18/2020   Pneumococcal Conjugate-13 08/30/2014   Pneumococcal Polysaccharide-23 11/17/2008, 08/21/2017   Td 11/17/2008   Tdap 12/30/2017   Zoster, Live 11/21/2009    Past Medical History:  Diagnosis Date   Abdominal aortic aneurysm ([place])    followed by Dr. Sherren Mocha Early   Arthritis    hands   Blind left eye    CAD (coronary artery disease) 08/1995   50-55% second diag dz.; occluded OM   Cataract    Complication of anesthesia    "Blood pressure went down" with retinal surgery - no problems with previous surgeries   Echocardiogram abnormal 03/2012   mild to mod AS, valve area1.58 cm2, mild concentric LVH, grade I diastolic dusfunction   Gout    Heart murmur    History of radiation therapy 07/17/2020-07/27/2020   SBRT to right and left lung    Dr Gery Pray   Hyperlipidemia    Hypertension    Hypothyroidism  MI (myocardial infarction) ([place]) 01/25/2018   Myocardial infarction [facility])    mild/ age 52   Osteoporosis    PAD (peripheral artery disease) ([place])    mild to mod Rt ICA stenosis followed by doppler-02/05/12   Pelvic mass    Stroke ([place]) 2001   no residual problems   Thyroid disease    Tobacco abuse    Ulcer     Tobacco History: Social History   Tobacco Use  Smoking Status Every Day   Packs/day: 0.50   Years: 68.00   Pack years: 34.00   Types: Cigarettes  Smokeless  Tobacco Never   Ready to quit: Not Answered Counseling given: Not Answered   Outpatient Medications Prior to Visit  Medication Sig Dispense Refill   acetaminophen (TYLENOL) 325 MG tablet Take 325 mg by mouth every 6 (six) hours as needed (pain.).     allopurinol (ZYLOPRIM) 100 MG tablet TAKE 1 TABLET BY MOUTH  DAILY 90 tablet 3   amLODipine (NORVASC) 5 MG tablet TAKE 1 TABLET BY MOUTH  DAILY 90 tablet 3   atorvastatin (LIPITOR) 40 MG tablet TAKE 2 TABLETS BY MOUTH  DAILY (Patient taking differently: Take 80 mg by mouth at bedtime.) 180 tablet 3   Cholecalciferol (VITAMIN D3) 50 MCG (2000 UT) TABS Take 2,000 Units by mouth daily.     clopidogrel (PLAVIX) 75 MG tablet TAKE 1 TABLET BY MOUTH  DAILY 90 tablet 3   furosemide (LASIX) 20 MG tablet TAKE 1-2 TABLETS BY MOUTH  DAILY AS NEEDED FOR EDEMA. 180 tablet 1   isosorbide mononitrate (IMDUR) 30 MG 24 hr tablet TAKE 1 TABLET BY MOUTH  DAILY (Patient taking differently: Take 30 mg by mouth daily.) 90 tablet 3   levothyroxine (SYNTHROID) 50 MCG tablet TAKE 1 TABLET BY MOUTH  DAILY 90 tablet 3   losartan (COZAAR) 25 MG tablet TAKE 1 TABLET BY MOUTH  DAILY 90 tablet 3   metoprolol tartrate (LOPRESSOR) 25 MG tablet TAKE ONE-HALF TABLET BY  MOUTH TWICE DAILY (Patient taking differently: Take 12.5 mg by mouth 2 (two) times daily.) 90 tablet 3   Multiple Vitamins-Minerals (PRESERVISION/LUTEIN) CAPS Take 1 capsule by mouth 3 (three) times a week. In the morning     Naproxen Sodium (ALEVE PO) Take by mouth.     nitroGLYCERIN (NITROSTAT) 0.4 MG SL tablet Place 0.4 mg under the tongue every 5 (five) minutes x 3 doses as needed for chest pain.     tiZANidine (ZANAFLEX) 4 MG tablet Take 1 tablet (4 mg total) by mouth every 8 (eight) hours as needed for muscle spasms. 90 tablet 2   traMADol (ULTRAM) 50 MG tablet Take 0.5-1 tablets (25-50 mg total) by mouth every 8 (eight) hours as needed. 100 tablet 2   No facility-administered medications prior to visit.       Review of Systems  Review of Systems   Physical Exam  BP 124/76 (BP Location: Left Arm, Patient Position: Sitting, Cuff Size: Normal)   Pulse 92   Temp 97.9 F (36.6 C) (Oral)   Ht 5\' 1"  (1.549 m)   Wt 125 lb 12.8 oz (57.1 kg)   LMP  (LMP Unknown)   SpO2 96%   BMI 23.77 kg/m  Physical Exam   Lab Results:  CBC    Component Value Date/Time   WBC 7.2 06/13/2020 1036   RBC 4.83 06/13/2020 1036   HGB 13.1 06/13/2020 1036   HCT 43.8 06/13/2020 1036   PLT 220 06/13/2020 1036  MCV 90.7 06/13/2020 1036   MCV 88 05/18/2020 0000   MCH 27.1 06/13/2020 1036   MCHC 29.9 (L) 06/13/2020 1036   RDW 13.6 06/13/2020 1036   LYMPHSABS 1,994 01/12/2020 1130   MONOABS 0.4 01/06/2019 1135   EOSABS 125 01/12/2020 1130   BASOSABS 62 01/12/2020 1130    BMET    Component Value Date/Time   NA 139 06/13/2020 1036   NA 142 05/18/2020 0000   K 3.8 06/13/2020 1036   CL 106 06/13/2020 1036   CO2 24 06/13/2020 1036   GLUCOSE 80 06/13/2020 1036   BUN 15 06/13/2020 1036   BUN 15 05/18/2020 0000   CREATININE 1.00 06/13/2020 1036   CREATININE 0.91 (H) 01/12/2020 1130   CALCIUM 9.2 06/13/2020 1036   GFRNONAA 56 (L) 06/13/2020 1036   GFRNONAA 58 (L) 01/12/2020 1130   GFRAA 67 01/12/2020 1130    BNP No results found for: BNP  ProBNP No results found for: PROBNP  Imaging: No results found.   Assessment & Plan:   Non-small cell lung cancer ([place]) - Synchronous non-small cell lung cancer of the left lower lobe and right upper lobe. Following with Dr. Vonzella Nipple. She underwent 5 radiation treatment, last occurring end of February 2022. Repeat CT imaging in June 2022 showed interval decrease in size to treated areas. She has severe new small pulmonary nodules measuring up to 51mm. She has no respiratory complaints. Denies shortness of breath, wheezing or cough. Needs repeat CT chest in 3 months (September-October 2022). Follow-up with Dr. Valeta Harms as needed only for any  new or worsening respiratory symptoms.   Tobacco use disorder - Current smoker, 1/2 ppd. She is not ready to quit. Encourage she taper amount she is smoking and consider picking quit date.     Martyn Ehrich, NP 01/10/2021

## 2021-01-10 NOTE — Assessment & Plan Note (Signed)
-   Synchronous non-small cell lung cancer of the left lower lobe and right upper lobe. Following with Dr. Vonzella Nipple. She underwent 5 radiation treatment, last occurring end of February 2022. Repeat CT imaging in June 2022 showed interval decrease in size to treated areas. She has severe new small pulmonary nodules measuring up to 52mm. She has no respiratory complaints. Denies shortness of breath, wheezing or cough. Needs repeat CT chest in 3 months (September-October 2022). Follow-up with Dr. Valeta Harms as needed only for any new or worsening respiratory symptoms.

## 2021-01-10 NOTE — Patient Instructions (Signed)
-   CT chest in June 2022 showed decrease size of pulmonary nodules that were treated with radiation, there were some new small pulmonary nodules that will need to be monitored  - You will need repeat CT chest in September/October with Dr. Clabe Seal office (Radiation/oncology) - If you develop any respiratory symptoms including shortness of breath, wheezing or cough please call our office  Follow-up: - Dr. Valeta Harms as needed

## 2021-01-11 ENCOUNTER — Telehealth: Payer: Self-pay | Admitting: Internal Medicine

## 2021-01-11 NOTE — Chronic Care Management (AMB) (Signed)
  Chronic Care Management   Note  01/11/2021 Name: Tammy Brooks MRN: [id number] DOB: December 07, 1933  Tammy Brooks is a 87 y.o. year old female who is a primary care patient of Plotnikov, Evie Lacks, MD. I reached out to [name] by phone today in response to a referral sent by Tammy Brooks's PCP, Plotnikov, Evie Lacks, MD.   Tammy Brooks was given information about Chronic Care Management services today including:  CCM service includes personalized support from designated clinical staff supervised by her physician, including individualized plan of care and coordination with other care providers 24/7 contact phone numbers for assistance for urgent and routine care needs. Service will only be billed when office clinical staff spend 20 minutes or more in a month to coordinate care. Only one practitioner may furnish and bill the service in a calendar month. The patient may stop CCM services at any time (effective at the end of the month) by phone call to the office staff.   Patient agreed to services and verbal consent obtained.   Follow up plan:   Tammy Brooks

## 2021-01-12 ENCOUNTER — Encounter: Payer: Self-pay | Admitting: *Deleted

## 2021-01-12 NOTE — Telephone Encounter (Signed)
This encounter was created in error - please disregard.

## 2021-01-12 NOTE — Telephone Encounter (Addendum)
-----   Message from Lorretta Harp, MD sent at 01/11/2021 10:54 AM EDT ----- Normal LV systolic function with progression of her aortic stenosis now severe.  Needs return office visit with me to discuss  Left message for pt to call

## 2021-01-15 NOTE — Progress Notes (Signed)
PCCM:  Thanks for seeing her  Darvin Neighbours [facility] Pulmonary Critical Care 01/15/2021 5:28 PM

## 2021-01-22 ENCOUNTER — Ambulatory Visit: Payer: Medicare Other | Admitting: Internal Medicine

## 2021-01-23 ENCOUNTER — Other Ambulatory Visit: Payer: Self-pay | Admitting: Internal Medicine

## 2021-02-12 ENCOUNTER — Telehealth: Payer: Self-pay

## 2021-02-12 NOTE — Chronic Care Management (AMB) (Signed)
    Chronic Care Management Pharmacy Assistant   Name: Tammy Brooks  MRN: [id number] DOB: 1933/08/01  Tammy Brooks is an 87 y.o. year old female who presents for his initial CCM visit with the clinical pharmacist.   Recent office visits:  None in the last 6 months.  Recent consult visits:  01/10/21-[name], NP (Pulmonology) Seen for a follow up. Return if symptoms worsen or fail to improve.  Hospital visits:  None in previous 6 months  Medications: Outpatient Encounter Medications as of 02/12/2021  Medication Sig   acetaminophen (TYLENOL) 325 MG tablet Take 325 mg by mouth every 6 (six) hours as needed (pain.).   allopurinol (ZYLOPRIM) 100 MG tablet TAKE 1 TABLET BY MOUTH  DAILY   amLODipine (NORVASC) 5 MG tablet TAKE 1 TABLET BY MOUTH  DAILY   atorvastatin (LIPITOR) 40 MG tablet TAKE 2 TABLETS BY MOUTH  DAILY   Cholecalciferol (VITAMIN D3) 50 MCG (2000 UT) TABS Take 2,000 Units by mouth daily.   clopidogrel (PLAVIX) 75 MG tablet TAKE 1 TABLET BY MOUTH  DAILY   furosemide (LASIX) 20 MG tablet TAKE 1-2 TABLETS BY MOUTH  DAILY AS NEEDED FOR EDEMA.   isosorbide mononitrate (IMDUR) 30 MG 24 hr tablet TAKE 1 TABLET BY MOUTH  DAILY (Patient taking differently: Take 30 mg by mouth daily.)   levothyroxine (SYNTHROID) 50 MCG tablet TAKE 1 TABLET BY MOUTH  DAILY   losartan (COZAAR) 25 MG tablet TAKE 1 TABLET BY MOUTH  DAILY   metoprolol tartrate (LOPRESSOR) 25 MG tablet TAKE ONE-HALF TABLET BY  MOUTH TWICE DAILY (Patient taking differently: Take 12.5 mg by mouth 2 (two) times daily.)   Multiple Vitamins-Minerals (PRESERVISION/LUTEIN) CAPS Take 1 capsule by mouth 3 (three) times a week. In the morning   Naproxen Sodium (ALEVE PO) Take by mouth.   nitroGLYCERIN (NITROSTAT) 0.4 MG SL tablet Place 0.4 mg under the tongue every 5 (five) minutes x 3 doses as needed for chest pain.   tiZANidine (ZANAFLEX) 4 MG tablet Take 1 tablet (4 mg total) by mouth every 8 (eight) hours as needed for  muscle spasms.   traMADol (ULTRAM) 50 MG tablet Take 0.5-1 tablets (25-50 mg total) by mouth every 8 (eight) hours as needed.   No facility-administered encounter medications on file as of 02/12/2021.   Acetaminophen (TYLENOL) 325 MG tablet Last filled:None noted Allopurinol (ZYLOPRIM) 100 MG tablet  Last filled:11/22/20 90 DS AmLODipine (NORVASC) 5 MG tablet Last filled:12/01/20 90 DS Atorvastatin (LIPITOR) 40 MG tablet Last filled:11/29/20 90 DS Cholecalciferol (VITAMIN D3) 50 MCG (2000 UT) TABS Last filled:07/14/19 62 DS Clopidogrel (PLAVIX) 75 MG tablet Last filled:12/01/20 90 DS Furosemide (LASIX) 20 MG tablet Last filled:None noted Isosorbide mononitrate (IMDUR) 30 MG 24 hr tablet Last filled:12/01/20 90 DS Levothyroxine (SYNTHROID) 50 MCG tablet Last filled:01/11/21 90 DS Losartan (COZAAR) 25 MG tablet Last filled:12/01/20 90 DS Metoprolol tartrate (LOPRESSOR) 25 MG tablet Last filled:01/26/21 90 DS Naproxen Sodium (ALEVE PO) Last filled:None noted NitroGLYCERIN (NITROSTAT) 0.4 MG SL tablet Last filled:None noted TiZANidine (ZANAFLEX) 4 MG tablet Last filled:None noted TraMADol (ULTRAM) 50 MG tablet Last filled:01/12/20 30 DS   Star Rating Drugs: Losartan (COZAAR) 25 MG tablet Last filled:12/01/20 90 DS Atorvastatin (LIPITOR) 40 MG tablet Last filled:11/29/20 90 DS  Care Gaps Zoster Vaccines- Shingrix:Never done COVID-19 Vaccine:Last completed: Apr 18, 2020 INFLUENZA VACCINE:Last completed: Apr 18, 2020  Corrie Mckusick, [place]

## 2021-02-14 ENCOUNTER — Ambulatory Visit (INDEPENDENT_AMBULATORY_CARE_PROVIDER_SITE_OTHER): Payer: Medicare Other

## 2021-02-14 ENCOUNTER — Other Ambulatory Visit: Payer: Self-pay

## 2021-02-14 DIAGNOSIS — I10 Essential (primary) hypertension: Secondary | ICD-10-CM

## 2021-02-14 DIAGNOSIS — M1990 Unspecified osteoarthritis, unspecified site: Secondary | ICD-10-CM

## 2021-02-14 DIAGNOSIS — E785 Hyperlipidemia, unspecified: Secondary | ICD-10-CM

## 2021-02-14 DIAGNOSIS — I2510 Atherosclerotic heart disease of native coronary artery without angina pectoris: Secondary | ICD-10-CM

## 2021-02-14 DIAGNOSIS — Z9861 Coronary angioplasty status: Secondary | ICD-10-CM

## 2021-02-14 DIAGNOSIS — Z8673 Personal history of transient ischemic attack (TIA), and cerebral infarction without residual deficits: Secondary | ICD-10-CM

## 2021-02-14 DIAGNOSIS — M10079 Idiopathic gout, unspecified ankle and foot: Secondary | ICD-10-CM

## 2021-02-14 NOTE — Progress Notes (Addendum)
Chronic Care Management Pharmacy Note  02/14/2021 Name:  Tammy Brooks MRN:  [id number] DOB:  1933-06-22  Summary: - Patient reports that she continued to smoke about 1/2 ppd - working to quit but does not feel she is ready to quit at his time  -Patient has been monitoring blood pressure, noted that BP is at goal typically averaging ~120/70-75 -Patient reports that gout and pain have been well controlled, has had no instances of gout attack recently, only using tramadol/naproxen/ APAP about 1 time per month if at all -Reports that she has been taking levothyroxine together with all of her other AM medications   Recommendations/Changes made from today's visit: -Recommending for patient to separate levothyroxine dose from other AM medications and foods by 30-60 minutes for better absorption of medication -discussed with patient benefits of smoking cessation as well as treatment/medications available to help aid in smoking cessation, patient was also given contact information for [place] Quit Line (1-800-QUIT-NOW) and encouraged patient to reach out to this group for support.  Patient encouraged to pick a quit date  for next appointment -Patient to continue to monitor blood pressure and reach out should BP elevate from goal prior to next appointment  Subjective: Tammy Brooks is an 87 y.o. year old female who is a primary patient of Plotnikov, Evie Lacks, MD.  The CCM team was consulted for assistance with disease management and care coordination needs.    Engaged with patient by telephone for initial visit in response to provider referral for pharmacy case management and/or care coordination services.   Consent to Services:  The patient was given the following information about Chronic Care Management services today, agreed to services, and gave verbal consent: 1. CCM service includes personalized support from designated clinical staff supervised by the primary care provider, including individualized  plan of care and coordination with other care providers 2. 24/7 contact phone numbers for assistance for urgent and routine care needs. 3. Service will only be billed when office clinical staff spend 20 minutes or more in a month to coordinate care. 4. Only one practitioner may furnish and bill the service in a calendar month. 5.The patient may stop CCM services at any time (effective at the end of the month) by phone call to the office staff. 6. The patient will be responsible for cost sharing (co-pay) of up to 20% of the service fee (after annual deductible is met). Patient agreed to services and consent obtained.  Patient Care Team: Plotnikov, Evie Lacks, MD as PCP - General (Internal Medicine) Lorretta Harp, MD as PCP - Cardiology (Cardiology) Bond, Tracie Harrier, MD as Referring Physician (Ophthalmology) Gerarda Fraction, MD as Referring Physician (Ophthalmology) Marti Sleigh, MD as Attending Physician (Gynecology) Early, Arvilla Meres, MD as Consulting Physician (Vascular Surgery) Ralene Ok, MD as Consulting Physician (General Surgery) Ilean China (Inactive) as Physician Assistant (Cardiology) Drake Leach, [place] as Consulting Physician (Optometry) Delice Bison, Darnelle Maffucci, [facility] as Pharmacist (Pharmacist)  Recent office visits:  07/24/20 - [name] MD - f/u visit - brochoscopy of right and left lung - diagnosed lung nodules as NSCLC stage 1 disease - no changes to medications, encouraged smoking cessation    Recent consult visits:  01/10/21-[name], NP (Pulmonology) Seen for a follow up. Return if symptoms worsen or fail to improve. CT in 11/2020 shows decrease size of pulmonary nodules, will be due for repeat CT scan in Sep/Oct - follow up with Dr. Valeta Harms as needed  Hospital visits:  None in previous 6 months    Objective:  Lab Results  Component Value Date   CREATININE 1.00 06/13/2020   BUN 15 06/13/2020   GFR 50.51 (L) 07/12/2019   GFRNONAA 56 (L)  06/13/2020   GFRAA 67 01/12/2020   NA 139 06/13/2020   K 3.8 06/13/2020   CALCIUM 9.2 06/13/2020   CO2 24 06/13/2020   GLUCOSE 80 06/13/2020    Lab Results  Component Value Date/Time   GFR 50.51 (L) 07/12/2019 10:23 AM   GFR 64.71 01/06/2019 11:35 AM    Last diabetic Eye exam:  No results found for: HMDIABEYEEXA  Last diabetic Foot exam:  No results found for: HMDIABFOOTEX   Lab Results  Component Value Date   CHOL 122 07/12/2019   HDL 32.20 (L) 07/12/2019   LDLCALC 71 07/12/2019   LDLDIRECT 76.7 11/17/2008   TRIG 94.0 07/12/2019   CHOLHDL 4 07/12/2019    Hepatic Function Latest Ref Rng & Units 05/18/2020 01/12/2020 07/12/2019  Total Protein 6.1 - 8.1 g/dL - 6.2 6.5  Albumin 3.5 - 5.0 4.0 - 3.8  AST 13 - 35 24 18 16   ALT 7 - 35 14 14 9   Alk Phosphatase 25 - 125 68 - 60  Total Bilirubin 0.2 - 1.2 mg/dL - 0.5 0.5  Bilirubin, Direct 0.0 - 0.3 mg/dL - - 0.1    Lab Results  Component Value Date/Time   TSH 7.68 (H) 07/12/2019 10:23 AM   TSH 6.52 (H) 01/06/2019 11:35 AM   FREET4 1.24 07/12/2019 10:23 AM   FREET4 1.04 01/07/2019 01:15 PM    CBC Latest Ref Rng & Units 06/13/2020 05/18/2020 01/12/2020  WBC 4.0 - 10.5 K/uL 7.2 8.6 8.9  Hemoglobin 12.0 - 15.0 g/dL 13.1 13.6 13.6  Hematocrit 36.0 - 46.0 % 43.8 42 41.9  Platelets 150 - 400 K/uL 220 217 204    Lab Results  Component Value Date/Time   VD25OH 43 01/12/2020 11:30 AM   VD25OH 20.51 (L) 07/12/2019 10:23 AM    Clinical ASCVD: Yes  The ASCVD Risk score (Arnett DK, et al., 2019) failed to calculate for the following reasons:   The 2019 ASCVD risk score is only valid for ages 40 to 31   The patient has a prior MI or stroke diagnosis    Depression screen [facility] 2/9 08/03/2020 07/24/2020 01/12/2020  Decreased Interest 0 0 0  Down, Depressed, Hopeless 0 0 0  PHQ - 2 Score 0 0 0  Altered sleeping - - -  Tired, decreased energy - - -  Change in appetite - - -  Feeling bad or failure about yourself  - - -  Trouble  concentrating - - -  Moving slowly or fidgety/restless - - -  Suicidal thoughts - - -  PHQ-9 Score - - -  Difficult doing work/chores - - -  Some recent data might be hidden   Social History   Tobacco Use  Smoking Status Every Day   Packs/day: 0.50   Years: 68.00   Pack years: 34.00   Types: Cigarettes  Smokeless Tobacco Never   BP Readings from Last 3 Encounters:  01/10/21 124/76  12/07/20 130/68  08/31/20 121/67   Pulse Readings from Last 3 Encounters:  01/10/21 92  12/07/20 60  08/31/20 73   Wt Readings from Last 3 Encounters:  01/10/21 125 lb 12.8 oz (57.1 kg)  12/07/20 127 lb 6.4 oz (57.8 kg)  08/31/20 128 lb 6.4 oz (58.2 kg)  BMI Readings from Last 3 Encounters:  01/10/21 23.77 kg/m  12/07/20 24.07 kg/m  08/31/20 23.71 kg/m    Assessment/Interventions: Review of patient past medical history, allergies, medications, health status, including review of consultants reports, laboratory and other test data, was performed as part of comprehensive evaluation and provision of chronic care management services.   SDOH:  (Social Determinants of Health) assessments and interventions performed: Yes  SDOH Screenings   Alcohol Screen: Low Risk    Last Alcohol Screening Score (AUDIT): 0  Depression (PHQ2-9): Low Risk    PHQ-2 Score: 0  Financial Resource Strain: Low Risk    Difficulty of Paying Living Expenses: Not hard at all  Food Insecurity: No Food Insecurity   Worried About Charity fundraiser in the Last Year: Never true   Ran Out of Food in the Last Year: Never true  Housing: Low Risk    Last Housing Risk Score: 0  Physical Activity: Sufficiently Active   Days of Exercise per Week: 5 days   Minutes of Exercise per Session: 30 min  Social Connections: Moderately Integrated   Frequency of Communication with Friends and Family: More than three times a week   Frequency of Social Gatherings with Friends and Family: More than three times a week   Attends  Religious Services: More than 4 times per year   Active Member of Clubs or Organizations: No   Attends Music therapist: More than 4 times per year   Marital Status: Widowed  Stress: No Stress Concern Present   Feeling of Stress : Not at all  Tobacco Use: High Risk   Smoking Tobacco Use: Every Day   Smokeless Tobacco Use: Never  Transportation Needs: No Transportation Needs   Lack of Transportation (Medical): No   Lack of Transportation (Non-Medical): No    CCM Care Plan  Allergies  Allergen Reactions   Nabumetone Anaphylaxis, Swelling and Other (See Comments)    Causes inflammation of airways   Tape Other (See Comments)    SKIN IS VERY THIN AND TEARS EASILY- PLEASE USE AN ALTERNATIVE TO TAPE, IF POSSIBLE!!    Medications Reviewed Today     Reviewed by Tasia Catchings, CMA (Certified Medical Assistant) on 01/10/21 at 1349  Med List Status: <None>   Medication Order Taking? Sig Documenting Provider Last Dose Status Informant  acetaminophen (TYLENOL) 325 MG tablet [id number] Yes Take 325 mg by mouth every 6 (six) hours as needed (pain.). [provider] Taking Active   allopurinol (ZYLOPRIM) 100 MG tablet [id number] Yes TAKE 1 TABLET BY MOUTH  DAILY Plotnikov, Evie Lacks, MD Taking Active   amLODipine (NORVASC) 5 MG tablet [id number] Yes TAKE 1 TABLET BY MOUTH  DAILY Lorretta Harp, MD Taking Active   atorvastatin (LIPITOR) 40 MG tablet [id number] Yes TAKE 2 TABLETS BY MOUTH  DAILY  Patient taking differently: Take 80 mg by mouth at bedtime.   Plotnikov, Evie Lacks, MD Taking Active   Cholecalciferol (VITAMIN D3) 50 MCG (2000 UT) TABS [id number] Yes Take 2,000 Units by mouth daily. [provider] Taking Active Self  clopidogrel (PLAVIX) 75 MG tablet [id number] Yes TAKE 1 TABLET BY MOUTH  DAILY Plotnikov, Evie Lacks, MD Taking Active   furosemide (LASIX) 20 MG tablet [id number] Yes TAKE 1-2 TABLETS BY MOUTH  DAILY AS NEEDED FOR EDEMA. Plotnikov, Evie Lacks, MD  Taking Active   isosorbide mononitrate (IMDUR) 30 MG 24 hr tablet [id number] Yes TAKE 1 TABLET BY MOUTH  DAILY  Patient taking  differently: Take 30 mg by mouth daily.   Lorretta Harp, MD Taking Active   levothyroxine (SYNTHROID) 50 MCG tablet [id number] Yes TAKE 1 TABLET BY MOUTH  DAILY Plotnikov, Evie Lacks, MD Taking Active   losartan (COZAAR) 25 MG tablet [id number] Yes TAKE 1 TABLET BY MOUTH  DAILY Lorretta Harp, MD Taking Active   metoprolol tartrate (LOPRESSOR) 25 MG tablet [id number] Yes TAKE ONE-HALF TABLET BY  MOUTH TWICE DAILY  Patient taking differently: Take 12.5 mg by mouth 2 (two) times daily.   Plotnikov, Evie Lacks, MD Taking Active   Multiple Vitamins-Minerals (PRESERVISION/LUTEIN) CAPS [id number] Yes Take 1 capsule by mouth 3 (three) times a week. In the morning [provider] Taking Active Self  Naproxen Sodium (ALEVE PO) [id number] Yes Take by mouth. [provider] Taking Active   nitroGLYCERIN (NITROSTAT) 0.4 MG SL tablet [id number] Yes Place 0.4 mg under the tongue every 5 (five) minutes x 3 doses as needed for chest pain. [provider] Taking Active   tiZANidine (ZANAFLEX) 4 MG tablet [id number] Yes Take 1 tablet (4 mg total) by mouth every 8 (eight) hours as needed for muscle spasms. Plotnikov, Evie Lacks, MD Taking Active   traMADol (ULTRAM) 50 MG tablet [id number] Yes Take 0.5-1 tablets (25-50 mg total) by mouth every 8 (eight) hours as needed. Plotnikov, Evie Lacks, MD Taking Active             Patient Active Problem List   Diagnosis Date Noted   Non-small cell lung cancer ([place]) 01/10/2021   Primary cancer of right upper lobe of lung ([place]) 07/09/2020   Primary cancer of left lower lobe of lung ([place]) 06/29/2020   Multiple lung nodules 06/13/2020   Lung nodules    Paresthesias 01/12/2020   Vitamin D deficiency 01/12/2020   Chest pain 08/31/2018   CRI (chronic renal insufficiency), stage 2 (mild) 06/29/2018   Severe aortic stenosis  06/08/2018   History of ST elevation myocardial infarction (STEMI) 02/11/2018   CAD S/P percutaneous coronary angioplasty 02/11/2018   Weakness of extremity 10/21/2017   Rash and nonspecific skin eruption 10/07/2017   Tobacco use disorder 08/21/2017   Laceration of hand 04/09/2016   Well adult exam 01/30/2016   Actinic keratoses 01/30/2016   Edema 11/30/2014   Inguinal hernia 08/30/2014   Low back pain 06/02/2014   Bradycardia 06/02/2014   Pelvic mass in female 04/26/2014   Bilateral lower extremity edema 03/09/2013   Hx of carotid artery stenosis, RT. -moderate, followed by Dr. Gwenlyn Found, Last dopplers 02/2012 03/09/2013   Seasonal allergies 03/09/2013   AAA (abdominal aortic aneurysm) without rupture ([place]) 03/04/2013   Gout 03/04/2013   Osteoarthritis 03/04/2013   Need for prophylactic vaccination and inoculation against influenza 03/18/2012   Chest pain, atypical 03/14/2012   Glaucoma 11/21/2009   UNSPECIFIED URINARY INCONTINENCE 01/20/2008   Dyslipidemia 10/08/2007   Essential hypertension 10/08/2007   Peripheral vascular disease ([place]) 10/08/2007   H/O: CVA (cerebrovascular accident) 10/08/2007   COLONIC POLYPS, HX OF 10/08/2007    Immunization History  Administered Date(s) Administered   Fluad Quad(high Dose 65+) 02/23/2019   Influenza Split 03/18/2012   Influenza Whole 03/04/2009   Influenza, High Dose Seasonal PF 02/04/2017, 03/11/2018   Influenza,inj,Quad PF,6+ Mos 04/13/2014, 01/30/2016   Influenza-Unspecified 03/03/2013, 05/09/2015, 04/18/2020   Moderna Sars-Covid-2 Vaccination 08/07/2019, 09/04/2019, 04/18/2020   Pneumococcal Conjugate-13 08/30/2014   Pneumococcal Polysaccharide-23 11/17/2008, 08/21/2017   Td 11/17/2008   Tdap 12/30/2017   Zoster, Live 11/21/2009    Conditions to  be addressed/monitored:  Hypertension, Hyperlipidemia, Hypothyroidism, Gout, Chronic Pain, Muscle Spasm, Tobacco Use, and History of Sroke   There are no care plans that you recently  modified to display for this patient.  Medication Assistance: None required.  Patient affirms current coverage meets needs.  Patient's preferred pharmacy is:  [id number] [address], [address] [place] [address] Phone: [phone] Fax: [phone]  [facility]  ([place], [place] [facility] [address] [place] [address] [place] [address] Phone: [phone] Fax: [phone]  [facility] ([facility]) - [place], [address] [place] [address] Phone: [phone] Fax: [phone]   Uses pill box? Yes Pt endorses 100% compliance  Care Plan and Follow Up Patient Decision:  Patient agrees to Care Plan and Follow-up.  Plan: Telephone follow up appointment with care management team member scheduled for:  3 months and The patient has been provided with contact information for the care management team and has been advised to call with any health related questions or concerns.   Tomasa Blase, PharmD Clinical Pharmacist, [place]  02/14/21 4:45 PM  Medical screening examination/treatment/procedure(s) were performed by non-physician practitioner and as supervising physician I was immediately available for consultation/collaboration.  I agree with above. [name], MD

## 2021-02-14 NOTE — Patient Instructions (Signed)
Tammy Brooks,  It was great to talk to you today!  Please call me with any questions or concerns.  Visit Information   PATIENT GOALS:   Goals Addressed             This Visit's Progress    Track and Manage My Blood Pressure-Hypertension       Timeframe:  Long-Range Goal Priority:  Medium Start Date:  02/14/2021                          Expected End Date: 08/14/2021                      Follow Up Date 05/16/2021   - check blood pressure 3 times per week - choose a place to take my blood pressure (home, clinic or office, retail store) - write blood pressure results in a log or diary    Why is this important?   You won't feel high blood pressure, but it can still hurt your blood vessels.  High blood pressure can cause heart or kidney problems. It can also cause a stroke.  Making lifestyle changes like losing a little weight or eating less salt will help.  Checking your blood pressure at home and at different times of the day can help to control blood pressure.  If the doctor prescribes medicine remember to take it the way the doctor ordered.  Call the office if you cannot afford the medicine or if there are questions about it.          Consent to CCM Services: Ms. Weist was given information about Chronic Care Management services including:  CCM service includes personalized support from designated clinical staff supervised by her physician, including individualized plan of care and coordination with other care providers 24/7 contact phone numbers for assistance for urgent and routine care needs. Service will only be billed when office clinical staff spend 20 minutes or more in a month to coordinate care. Only one practitioner may furnish and bill the service in a calendar month. The patient may stop CCM services at any time (effective at the end of the month) by phone call to the office staff. The patient will be responsible for cost sharing (co-pay) of up to 20% of the  service fee (after annual deductible is met).  Patient agreed to services and verbal consent obtained.   The patient verbalized understanding of instructions, educational materials, and care plan provided today and declined offer to receive copy of patient instructions, educational materials, and care plan.   Telephone follow up appointment with care management team member scheduled for: The patient has been provided with contact information for the care management team and has been advised to call with any health related questions or concerns.   Tomasa Blase, PharmD Clinical Pharmacist, [place]   CLINICAL CARE PLAN: Patient Care Plan: CCM Care Plan     Problem Identified: Hypertension, Hyperlipidemia, Hypothyroidism, Gout, Chronic Pain, Muscle Spasm, Tobacco Use, and History of Sroke   Priority: High  Onset Date: 02/14/2021     Long-Range Goal: Disease Management   Start Date: 02/14/2021  Expected End Date: 08/14/2021  This Visit's Progress: On track  Priority: High  Note:   Current Barriers:  Unable to independently monitor therapeutic efficacy  Pharmacist Clinical Goal(s):  Patient will achieve adherence to monitoring guidelines and medication adherence to achieve therapeutic efficacy through collaboration with PharmD and provider.  Interventions: 1:1 collaboration with Plotnikov, Evie Lacks, MD regarding development and update of comprehensive plan of care as evidenced by provider attestation and co-signature Inter-disciplinary care team collaboration (see longitudinal plan of care) Comprehensive medication review performed; medication list updated in electronic medical record  Hypertension (BP goal <130/80) -Controlled -Current treatment: Losartan 11m - 1 tablet daily  Amlodipine 599m- 1 tablet daily  Isosorbide Mononitrate 3068m 1 tablet daily  Metoprolol Tartrate 33m17m1/2 tablet twice daily  Furosemide 20mg37m-2 tablets daily as needed for edema   - has not taken recently - at times may be 1 month between doses  -Medications previously tried: diltiazem, hctz, lisinopril, telmisartan, valsartan,   -Current home readings: 114/69 -HR69 BP Readings from Last 3 Encounters:  01/10/21 124/76  12/07/20 130/68  08/31/20 121/67  -Current dietary habits: eats a low sodium diet, drinks 1 cup of coffee in the AM -Current exercise habits: reports no more than routine housework daily -Denies hypotensive/hypertensive symptoms -Educated on BP goals and benefits of medications for prevention of heart attack, stroke and kidney damage; Daily salt intake goal < 2300 mg; Exercise goal of 150 minutes per week; Importance of home blood pressure monitoring; Proper BP monitoring technique; Symptoms of hypotension and importance of maintaining adequate hydration; -Counseled to monitor BP at home 2-3 weeks , document, and provide log at future appointments -Counseled on diet and exercise extensively Recommended to continue current medication  Hyperlipidemia/ History of Stroke: (LDL goal < 70) -Controlled Lab Results  Component Value Date   LDLCALC 71 07/12/2019  -Current treatment: Atorvastatin 40mg 68mtablets daily  Clopidogrel 75mg -63mablet daily  -Medications previously tried: simvastatin, aspirin   -Current dietary patterns: reports that she will eat fried foods on occasion, but mostly tries to eat little to no red meat / high cholesterol foods  -Current exercise habits:  reports no more than routine housework daily -Educated on Cholesterol goals;  Benefits of statin for ASCVD risk reduction; Importance of limiting foods high in cholesterol; Exercise goal of 150 minutes per week; -Counseled on diet and exercise extensively Recommended to continue current medication  Gout (Goal: Prevention/ treatment of goat attacks) -Controlled -Current treatment  Allopurinol 100mg - 86mblet daily  -Medications previously tried: colchicine,   -Counseled  on diet and exercise extensively Recommended to continue current medication  Tobacco use (Goal Smoking Cessation) -Not ideally controlled -Previous quit attempts: reports that most recent quit attempt was after her stent placement in 2019 - quit for 9 days, restarted due to stress -Current treatment  N/a -Patient smokes After 30 minutes of waking -Patient triggers include: finishing a meal -On a scale of 1-10, reports MOTIVATION to quit is 5 -On a scale of 1-10, reports CONFIDENCE in quitting is 5 -Provided contact information for [place] Quit Line (1-800-QUIT-NOW) and encouraged patient to reach out to this group for support. -Recommended for patient to set a quit date, reviewed with patient medication options to aid in smoking cessation, believe that trial of nicotine patches in combination with lozenges/gum would be be successful for patient, declined intervention at this time, but is agreeable to discuss in the future  Hypothyroidism (Goal: Maintenance of euthyroid levels) -Not ideally controlled Lab Results  Component Value Date   TSH 7.68 (H) 07/12/2019  -Current treatment  Levothyroxine 50mcg - 23mblet daily  -Medications previously tried: n/a  -Recommended for patient to has TSH level checked with next appointment, upon further review patient indicated that she takes  levothyroxine in the morning along with all of her other medications, advised for patient to separate levothyroxine from food/medications by at least 30-6 minutes in the morning for better absorption of medication - should help improve TSH levels as well  Chronic Pain/ Spasms (Goal: Pain/ spasm control) -Controlled -Current treatment  Acetaminophen 373m - 1 tablet every 6 hours as needed  Naproxen 22100m- 1 tablet every 12 hours as needed  Tramadol 5019m 1 tablet daily as needed (taking ~1 time per month)  Tizanidine 4mg35m1 tablet daily as needed (taking ~1 time per month) -Medications previously tried: meloxicam,  oxycodone,   -Recommended to continue current medication  Health Maintenance -Vaccine gaps: Shingles, COVID booster, and Influenza vaccine -Current therapy:  Vitamin D3 2000 units - 1 tablet daily  Preservision/Lutein - 1 capsule daily  -Educated on Cost vs benefit of each product must be carefully weighed by individual consumer -Patient is satisfied with current therapy and denies issues -Recommended to continue current medication  Patient Goals/Self-Care Activities Patient will:  - take medications as prescribed check blood pressure 2-3 times weekly, document, and provide at future appointments target a minimum of 150 minutes of moderate intensity exercise weekly engage in dietary modifications by reducing high cholesterol foods  Follow Up Plan: Telephone follow up appointment with care management team member scheduled for: 3 months The patient has been provided with contact information for the care management team and has been advised to call with any health related questions or concerns.

## 2021-02-15 ENCOUNTER — Encounter: Payer: Self-pay | Admitting: Internal Medicine

## 2021-02-15 ENCOUNTER — Ambulatory Visit (INDEPENDENT_AMBULATORY_CARE_PROVIDER_SITE_OTHER): Payer: Medicare Other | Admitting: Internal Medicine

## 2021-02-15 ENCOUNTER — Other Ambulatory Visit: Payer: Self-pay

## 2021-02-15 VITALS — BP 132/72 | HR 60 | Temp 97.9°F | Ht 61.0 in | Wt 132.2 lb

## 2021-02-15 DIAGNOSIS — Z23 Encounter for immunization: Secondary | ICD-10-CM

## 2021-02-15 DIAGNOSIS — C3432 Malignant neoplasm of lower lobe, left bronchus or lung: Secondary | ICD-10-CM | POA: Diagnosis not present

## 2021-02-15 DIAGNOSIS — C3411 Malignant neoplasm of upper lobe, right bronchus or lung: Secondary | ICD-10-CM

## 2021-02-15 DIAGNOSIS — Z0000 Encounter for general adult medical examination without abnormal findings: Secondary | ICD-10-CM

## 2021-02-15 DIAGNOSIS — N182 Chronic kidney disease, stage 2 (mild): Secondary | ICD-10-CM | POA: Diagnosis not present

## 2021-02-15 DIAGNOSIS — I2510 Atherosclerotic heart disease of native coronary artery without angina pectoris: Secondary | ICD-10-CM

## 2021-02-15 DIAGNOSIS — Z9861 Coronary angioplasty status: Secondary | ICD-10-CM

## 2021-02-15 DIAGNOSIS — E785 Hyperlipidemia, unspecified: Secondary | ICD-10-CM

## 2021-02-15 LAB — COMPREHENSIVE METABOLIC PANEL
ALT: 9 U/L (ref 0–35)
AST: 15 U/L (ref 0–37)
Albumin: 3.8 g/dL (ref 3.5–5.2)
Alkaline Phosphatase: 56 U/L (ref 39–117)
BUN: 18 mg/dL (ref 6–23)
CO2: 26 mEq/L (ref 19–32)
Calcium: 9.3 mg/dL (ref 8.4–10.5)
Chloride: 107 mEq/L (ref 96–112)
Creatinine, Ser: 1.15 mg/dL (ref 0.40–1.20)
GFR: 43.48 mL/min — ABNORMAL LOW (ref >60.00)
Glucose, Bld: 94 mg/dL (ref 70–99)
Potassium: 4.7 mEq/L (ref 3.5–5.1)
Sodium: 141 mEq/L (ref 135–145)
Total Bilirubin: 0.5 mg/dL (ref 0.2–1.2)
Total Protein: 6.4 g/dL (ref 6.0–8.3)

## 2021-02-15 LAB — URINALYSIS
Bilirubin Urine: NEGATIVE
Hgb urine dipstick: NEGATIVE
Ketones, ur: NEGATIVE
Leukocytes,Ua: NEGATIVE
Nitrite: NEGATIVE
Specific Gravity, Urine: 1.025 (ref 1.000–1.030)
Total Protein, Urine: NEGATIVE
Urine Glucose: NEGATIVE
Urobilinogen, UA: 0.2 (ref 0.0–1.0)
pH: 5.5 (ref 5.0–8.0)

## 2021-02-15 LAB — LIPID PANEL
Cholesterol: 116 mg/dL (ref 0–200)
HDL: 32.2 mg/dL — ABNORMAL LOW (ref >39.00)
LDL Cholesterol: 65 mg/dL (ref 0–99)
NonHDL: 83.5
Total CHOL/HDL Ratio: 4
Triglycerides: 91 mg/dL (ref 0.0–149.0)
VLDL: 18.2 mg/dL (ref 0.0–40.0)

## 2021-02-15 LAB — CBC WITH DIFFERENTIAL/PLATELET
Basophils Absolute: 0 10*3/uL (ref 0.0–0.1)
Basophils Relative: 0.6 % (ref 0.0–3.0)
Eosinophils Absolute: 0.1 10*3/uL (ref 0.0–0.7)
Eosinophils Relative: 1.5 % (ref 0.0–5.0)
HCT: 40.2 % (ref 36.0–46.0)
Hemoglobin: 13.2 g/dL (ref 12.0–15.0)
Lymphocytes Relative: 24.3 % (ref 12.0–46.0)
Lymphs Abs: 1.7 10*3/uL (ref 0.7–4.0)
MCHC: 32.8 g/dL (ref 30.0–36.0)
MCV: 85.6 fl (ref 78.0–100.0)
Monocytes Absolute: 0.5 10*3/uL (ref 0.1–1.0)
Monocytes Relative: 7 % (ref 3.0–12.0)
Neutro Abs: 4.7 10*3/uL (ref 1.4–7.7)
Neutrophils Relative %: 66.6 % (ref 43.0–77.0)
Platelets: 177 10*3/uL (ref 150.0–400.0)
RBC: 4.7 Mil/uL (ref 3.87–5.11)
RDW: 14.7 % (ref 11.5–15.5)
WBC: 7 10*3/uL (ref 4.0–10.5)

## 2021-02-15 LAB — TSH: TSH: 4.12 u[IU]/mL (ref 0.35–5.50)

## 2021-02-15 MED ORDER — LEVOTHYROXINE SODIUM 50 MCG PO TABS: 50.0000 ug | ORAL_TABLET | Freq: Every day | ORAL | 3 refills | Status: DC

## 2021-02-15 MED ORDER — METOPROLOL TARTRATE 25 MG PO TABS: ORAL_TABLET | ORAL | 3 refills | Status: DC

## 2021-02-15 MED ORDER — AMLODIPINE BESYLATE 5 MG PO TABS: 5.0000 mg | ORAL_TABLET | Freq: Every day | ORAL | 3 refills | Status: DC

## 2021-02-15 MED ORDER — CLOPIDOGREL BISULFATE 75 MG PO TABS: 75.0000 mg | ORAL_TABLET | Freq: Every day | ORAL | 3 refills | Status: DC

## 2021-02-15 MED ORDER — NITROGLYCERIN 0.4 MG SL SUBL: 0.4000 mg | SUBLINGUAL_TABLET | SUBLINGUAL | 3 refills | Status: DC | PRN

## 2021-02-15 MED ORDER — ALLOPURINOL 100 MG PO TABS: 50.0000 mg | ORAL_TABLET | Freq: Every day | ORAL | 3 refills | Status: DC

## 2021-02-15 MED ORDER — SHINGRIX 50 MCG/0.5ML IM SUSR: 0.5000 mL | Freq: Once | INTRAMUSCULAR | 1 refills | Status: AC

## 2021-02-15 MED ORDER — ATORVASTATIN CALCIUM 40 MG PO TABS: 80.0000 mg | ORAL_TABLET | Freq: Every day | ORAL | 3 refills | Status: DC

## 2021-02-15 MED ORDER — LOSARTAN POTASSIUM 25 MG PO TABS: 25.0000 mg | ORAL_TABLET | Freq: Every day | ORAL | 3 refills | Status: DC

## 2021-02-15 MED ORDER — ISOSORBIDE MONONITRATE ER 30 MG PO TB24: 30.0000 mg | ORAL_TABLET | Freq: Every day | ORAL | 3 refills | Status: DC

## 2021-02-15 NOTE — Progress Notes (Signed)
Subjective:  Patient ID: Tammy Brooks, female    DOB: [birth year]  Age: 87 y.o. MRN: [id number]  CC: Follow-up (6 month f/u- Flu shot)   HPI Tammy Brooks presents for lung cancer, HTN, gout, COPD f/u  Outpatient Medications Prior to Visit  Medication Sig Dispense Refill   acetaminophen (TYLENOL) 325 MG tablet Take 325 mg by mouth every 6 (six) hours as needed (pain.).     allopurinol (ZYLOPRIM) 100 MG tablet TAKE 1 TABLET BY MOUTH  DAILY 90 tablet 3   amLODipine (NORVASC) 5 MG tablet TAKE 1 TABLET BY MOUTH  DAILY 90 tablet 3   atorvastatin (LIPITOR) 40 MG tablet TAKE 2 TABLETS BY MOUTH  DAILY 180 tablet 1   Cholecalciferol (VITAMIN D3) 50 MCG (2000 UT) TABS Take 2,000 Units by mouth daily.     clopidogrel (PLAVIX) 75 MG tablet TAKE 1 TABLET BY MOUTH  DAILY 90 tablet 3   furosemide (LASIX) 20 MG tablet TAKE 1-2 TABLETS BY MOUTH  DAILY AS NEEDED FOR EDEMA. 180 tablet 1   isosorbide mononitrate (IMDUR) 30 MG 24 hr tablet TAKE 1 TABLET BY MOUTH  DAILY (Patient taking differently: Take 30 mg by mouth daily.) 90 tablet 3   levothyroxine (SYNTHROID) 50 MCG tablet TAKE 1 TABLET BY MOUTH  DAILY 90 tablet 3   losartan (COZAAR) 25 MG tablet TAKE 1 TABLET BY MOUTH  DAILY 90 tablet 3   metoprolol tartrate (LOPRESSOR) 25 MG tablet TAKE ONE-HALF TABLET BY  MOUTH TWICE DAILY (Patient taking differently: Take 12.5 mg by mouth 2 (two) times daily.) 90 tablet 3   Multiple Vitamins-Minerals (PRESERVISION/LUTEIN) CAPS Take 1 capsule by mouth 3 (three) times a week. In the morning     Naproxen Sodium (ALEVE PO) Take 220 mg by mouth 2 (two) times daily as needed.     nitroGLYCERIN (NITROSTAT) 0.4 MG SL tablet Place 0.4 mg under the tongue every 5 (five) minutes x 3 doses as needed for chest pain.     tiZANidine (ZANAFLEX) 4 MG tablet Take 1 tablet (4 mg total) by mouth every 8 (eight) hours as needed for muscle spasms. 90 tablet 2   traMADol (ULTRAM) 50 MG tablet Take 0.5-1 tablets (25-50 mg total) by mouth every  8 (eight) hours as needed. 100 tablet 2   No facility-administered medications prior to visit.    ROS: Review of Systems  Constitutional:  Negative for activity change, appetite change, chills, fatigue and unexpected weight change.  HENT:  Negative for congestion, mouth sores and sinus pressure.   Eyes:  Negative for visual disturbance.  Respiratory:  Negative for cough and chest tightness.   Gastrointestinal:  Negative for abdominal pain and nausea.  Genitourinary:  Negative for difficulty urinating, frequency and vaginal pain.  Musculoskeletal:  Negative for back pain and gait problem.  Skin:  Negative for pallor and rash.  Neurological:  Negative for dizziness, tremors, weakness, numbness and headaches.  Psychiatric/Behavioral:  Negative for confusion, sleep disturbance and suicidal ideas.    Objective:  BP 132/72 (BP Location: Left Arm)   Pulse 60   Temp 97.9 F (36.6 C) (Oral)   Ht 5\' 1"  (1.549 m)   Wt 132 lb 3.2 oz (60 kg)   LMP  (LMP Unknown)   SpO2 94%   BMI 24.98 kg/m   BP Readings from Last 3 Encounters:  02/15/21 132/72  01/10/21 124/76  12/07/20 130/68    Wt Readings from Last 3 Encounters:  02/15/21 132 lb 3.2 oz (  60 kg)  01/10/21 125 lb 12.8 oz (57.1 kg)  12/07/20 127 lb 6.4 oz (57.8 kg)    Physical Exam Constitutional:      General: She is not in acute distress.    Appearance: Normal appearance. She is well-developed.  HENT:     Head: Normocephalic.     Right Ear: External ear normal.     Left Ear: External ear normal.     Nose: Nose normal.  Eyes:     General:        Right eye: No discharge.        Left eye: No discharge.     Conjunctiva/sclera: Conjunctivae normal.     Pupils: Pupils are equal, round, and reactive to light.  Neck:     Thyroid: No thyromegaly.     Vascular: No JVD.     Trachea: No tracheal deviation.  Cardiovascular:     Rate and Rhythm: Normal rate and regular rhythm.     Heart sounds: Normal heart sounds.  Pulmonary:      Effort: No respiratory distress.     Breath sounds: No stridor. No wheezing.  Abdominal:     General: Bowel sounds are normal. There is no distension.     Palpations: Abdomen is soft. There is no mass.     Tenderness: There is no abdominal tenderness. There is no guarding or rebound.  Musculoskeletal:        General: No tenderness.     Cervical back: Normal range of motion and neck supple. No rigidity.  Lymphadenopathy:     Cervical: No cervical adenopathy.  Skin:    Findings: No erythema or rash.  Neurological:     Mental Status: She is oriented to person, place, and time.     Cranial Nerves: No cranial nerve deficit.     Motor: No abnormal muscle tone.     Coordination: Coordination normal.     Deep Tendon Reflexes: Reflexes normal.  Psychiatric:        Behavior: Behavior normal.        Thought Content: Thought content normal.        Judgment: Judgment normal.  AKs  Lab Results  Component Value Date   WBC 7.2 06/13/2020   HGB 13.1 06/13/2020   HCT 43.8 06/13/2020   PLT 220 06/13/2020   GLUCOSE 80 06/13/2020   CHOL 122 07/12/2019   TRIG 94.0 07/12/2019   HDL 32.20 (L) 07/12/2019   LDLDIRECT 76.7 11/17/2008   LDLCALC 71 07/12/2019   ALT 14 05/18/2020   AST 24 05/18/2020   NA 139 06/13/2020   K 3.8 06/13/2020   CL 106 06/13/2020   CREATININE 1.00 06/13/2020   BUN 15 06/13/2020   CO2 24 06/13/2020   TSH 7.68 (H) 07/12/2019    No results found.  Assessment & Plan:   Problem List Items Addressed This Visit   None Visit Diagnoses     Needs flu shot    -  Primary   Relevant Orders   Flu Vaccine QUAD High Dose(Fluad) (Completed)         Follow-up: No follow-ups on file.  Walker Kehr, MD

## 2021-02-15 NOTE — Addendum Note (Signed)
Addended by: Boris Lown B on: 02/15/2021 03:45 PM   Modules accepted: Orders

## 2021-02-15 NOTE — Assessment & Plan Note (Signed)
Cont on Toprol, Plavix, Lipitor, Amlodipine, Imdur Check labs

## 2021-02-15 NOTE — Assessment & Plan Note (Signed)
Hydrate well ?Monitor GFR ?

## 2021-02-15 NOTE — Assessment & Plan Note (Signed)
F/u w/Dr Sondra Come Patient had navigational bronchoscopy to both right and left lung.  Diagnosed lung nodules as non-small cell lung cancer.  Both stage I disease.   Patient has been under SBRT treatments at present. No significant cough or shortness of breath. S/p XRTx5 in 2022 CT pending in Nov 22

## 2021-02-28 ENCOUNTER — Encounter: Payer: Self-pay | Admitting: Cardiovascular Disease

## 2021-02-28 ENCOUNTER — Other Ambulatory Visit: Payer: Self-pay

## 2021-02-28 ENCOUNTER — Ambulatory Visit: Payer: Medicare Other | Admitting: Cardiovascular Disease

## 2021-02-28 VITALS — BP 134/68 | HR 51 | Ht 61.0 in | Wt 123.2 lb

## 2021-02-28 DIAGNOSIS — Z9861 Coronary angioplasty status: Secondary | ICD-10-CM | POA: Diagnosis not present

## 2021-02-28 DIAGNOSIS — I10 Essential (primary) hypertension: Secondary | ICD-10-CM | POA: Diagnosis not present

## 2021-02-28 DIAGNOSIS — F17200 Nicotine dependence, unspecified, uncomplicated: Secondary | ICD-10-CM | POA: Diagnosis not present

## 2021-02-28 DIAGNOSIS — E785 Hyperlipidemia, unspecified: Secondary | ICD-10-CM | POA: Diagnosis not present

## 2021-02-28 DIAGNOSIS — I350 Nonrheumatic aortic (valve) stenosis: Secondary | ICD-10-CM | POA: Diagnosis not present

## 2021-02-28 DIAGNOSIS — I714 Abdominal aortic aneurysm, without rupture, unspecified: Secondary | ICD-10-CM

## 2021-02-28 DIAGNOSIS — I2510 Atherosclerotic heart disease of native coronary artery without angina pectoris: Secondary | ICD-10-CM

## 2021-02-28 NOTE — Patient Instructions (Signed)
Medication Instructions:  Your physician recommends that you continue on your current medications as directed. Please refer to the Current Medication list given to you today.  *If you need a refill on your cardiac medications before your next appointment, please call your pharmacy*  Testing/Procedures: Your physician has requested that you have an echocardiogram. Echocardiography is a painless test that uses sound waves to create images of your heart. It provides your doctor with information about the size and shape of your heart and how well your heart's chambers and valves are working. This procedure takes approximately one hour. There are no restrictions for this procedure. To be done in August 2023. This procedure is done at [address].    Follow-Up: At [facility], you and your health needs are our priority.  As part of our continuing mission to provide you with exceptional heart care, we have created designated Provider Care Teams.  These Care Teams include your primary Cardiologist (physician) and Advanced Practice Providers (APPs -  Physician Assistants and Nurse Practitioners) who all work together to provide you with the care you need, when you need it.  We recommend signing up for the patient portal called "MyChart".  Sign up information is provided on this After Visit Summary.  MyChart is used to connect with patients for Virtual Visits (Telemedicine).  Patients are able to view lab/test results, encounter notes, upcoming appointments, etc.  Non-urgent messages can be sent to your provider as well.   To learn more about what you can do with MyChart, go to [email or web address].    Your next appointment:   6 month(s)  The format for your next appointment:   In Person  Provider:   You will see one of the following Advanced Practice Providers on your designated Care Team:   Sande Rives, PA-C Coletta Memos, FNP  Then, Quay Burow, MD will plan to see you again in  12 month(s).

## 2021-02-28 NOTE — Assessment & Plan Note (Signed)
History of essential hypertension a blood pressure measured today 130 34/68.  She is on amlodipine, metoprolol and losartan.

## 2021-02-28 NOTE — Assessment & Plan Note (Signed)
History of CAD status post mild CAD by cath 1997.  She apparently had PCI of circumflex obtuse marginal branch at [facility] 01/25/2018.  She denies chest pain.

## 2021-02-28 NOTE — Assessment & Plan Note (Signed)
Ongoing tobacco abuse of 1/2 pack/day recalcitrant risk factor modification

## 2021-02-28 NOTE — Assessment & Plan Note (Signed)
History of abdominal aortic aneurysm measuring 5.5 cm followed by Dr. Standley Dakins

## 2021-02-28 NOTE — Assessment & Plan Note (Signed)
History of dyslipidemia on statin therapy with lipid profile performed 02/15/2021 revealing total cholesterol 116, LDL 65 and HDL 32.

## 2021-02-28 NOTE — Progress Notes (Signed)
02/28/2021 Tammy Brooks   [birth year]  [id number]  Primary Physician Plotnikov, Evie Lacks, MD Primary Cardiologist: Lorretta Harp MD Lupe Carney, [place]  HPI:  Tammy Brooks is a 87 y.o.  mildly overweight widowed Caucasian female mother of 15, grandmother of 25 grandchildren .I last saw her in the office 02/01/2020.  She has a history of mild CAD by cath in 1997. Her lipid problems include continued tobacco abuse one pack per day, hypertension, and hyperlipidemia all medically treated. She does have mild to moderate aortic stenosis with valve area of 1 cm2 by 2-D echo one year ago.. She also has moderate right internal carotid artery stenosis by duplex ultrasound. She was recently diagnosed with a small abdominal aortic and iliac aneurysm followed by Dr. Donnetta Hutching. She denies chest pain or shortness of breath. She was found to have a pelvic mass which was surgically excised last year and found to be benign. She does have a hernia as result of this. She has been seen in the emergency room once for chest pain and rule out PE.  CTA was negative for this although it did show coronary calcification consistent with atherosclerosis.  She was scheduled for a Myoview stress test which has been delayed until September because of fiscal constraints.  She does continue to smoke a pack a day.  She currently denies chest pain or shortness of breath.   Since I saw her 3 months ago a follow-up echo shown stable moderate aortic stenosis with normal LV function.  She saw Kerin Ransom in the office 02/11/2018 who began her on indoor which it has resulted in marked improvement in her chest pain.  She also has a moderate abdominal aortic aneurysm followed by Dr. Donnetta Hutching measuring 5 cm.   Since I saw her in the office a year ago she continues to remain stable.  Her peak aortic gradient has increased by 2D echo with a valve area 0.54 cm.  Her abdominal aortic aneurysm now measures 5.5 cm followed by Dr. Standley Dakins.  She  apparently was recently found to have a spiculated lung nodule suspicious for cancer which is currently being worked up.  She does continue to smoke.  She denies chest pain or shortness of breath.     Current Meds  Medication Sig   acetaminophen (TYLENOL) 325 MG tablet Take 325 mg by mouth every 6 (six) hours as needed (pain.).   allopurinol (ZYLOPRIM) 100 MG tablet Take 0.5 tablets (50 mg total) by mouth daily.   amLODipine (NORVASC) 5 MG tablet Take 1 tablet (5 mg total) by mouth daily.   atorvastatin (LIPITOR) 40 MG tablet Take 2 tablets (80 mg total) by mouth daily.   Cholecalciferol (VITAMIN D3) 50 MCG (2000 UT) TABS Take 2,000 Units by mouth daily.   clopidogrel (PLAVIX) 75 MG tablet Take 1 tablet (75 mg total) by mouth daily.   furosemide (LASIX) 20 MG tablet TAKE 1-2 TABLETS BY MOUTH  DAILY AS NEEDED FOR EDEMA.   isosorbide mononitrate (IMDUR) 30 MG 24 hr tablet Take 1 tablet (30 mg total) by mouth daily.   levothyroxine (SYNTHROID) 50 MCG tablet Take 1 tablet (50 mcg total) by mouth daily.   losartan (COZAAR) 25 MG tablet Take 1 tablet (25 mg total) by mouth daily.   metoprolol tartrate (LOPRESSOR) 25 MG tablet TAKE ONE-HALF TABLET BY  MOUTH TWICE DAILY   Multiple Vitamins-Minerals (PRESERVISION/LUTEIN) CAPS Take 1 capsule by mouth 3 (three) times a week. In the morning  Naproxen Sodium (ALEVE PO) Take 220 mg by mouth 2 (two) times daily as needed.   nitroGLYCERIN (NITROSTAT) 0.4 MG SL tablet Place 1 tablet (0.4 mg total) under the tongue every 5 (five) minutes x 3 doses as needed for chest pain.   tiZANidine (ZANAFLEX) 4 MG tablet Take 1 tablet (4 mg total) by mouth every 8 (eight) hours as needed for muscle spasms.   traMADol (ULTRAM) 50 MG tablet Take 0.5-1 tablets (25-50 mg total) by mouth every 8 (eight) hours as needed.     Allergies  Allergen Reactions   Nabumetone Anaphylaxis, Swelling and Other (See Comments)    Causes inflammation of airways   Tape Other (See Comments)     SKIN IS VERY THIN AND TEARS EASILY- PLEASE USE AN ALTERNATIVE TO TAPE, IF POSSIBLE!!    Social History   Socioeconomic History   Marital status: Widowed    Spouse name: Not on file   Number of children: 5   Years of education: 9   Highest education level: 9th grade  Occupational History   Occupation: retired    Comment: retired  Tobacco Use   Smoking status: Every Day    Packs/day: 0.50    Years: 68.00    Pack years: 34.00    Types: Cigarettes   Smokeless tobacco: Never  Vaping Use   Vaping Use: Never used  Substance and Sexual Activity   Alcohol use: Not Currently    Alcohol/week: 0.0 standard drinks    Comment: occasionally - few drinks/month   Drug use: Never   Sexual activity: Not Currently    Partners: Male    Birth control/protection: Surgical    Comment: Hysterectomy  Other Topics Concern   Not on file  Social History Narrative   9th grade. married 08/19/2052- widowed Dec 10th'08. 4 sons - '55, '58, '60, '69 (two of these have passes away); 1 daughter - August 19, 2061 (deceased); 12 grandchildren; 68 great-grandchildren. 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 Other children are close and attentive. ACP/End-of-life: No CPR, no mechanical ventilation, no futile/heroic measures.   Social Determinants of Health   Financial Resource Strain: Low Risk    Difficulty of Paying Living Expenses: Not hard at all  Food Insecurity: No Food Insecurity   Worried About Charity fundraiser in the Last Year: Never true   [place] in the Last Year: Never true  Transportation Needs: No Transportation Needs   Lack of Transportation (Medical): No   Lack of Transportation (Non-Medical): No  Physical Activity: Sufficiently Active   Days of Exercise per Week: 5 days   Minutes of Exercise per Session: 30 min  Stress: No Stress Concern Present   Feeling of Stress : Not at all  Social Connections: Moderately Integrated   Frequency of Communication  with Friends and Family: More than three times a week   Frequency of Social Gatherings with Friends and Family: More than three times a week   Attends Religious Services: More than 4 times per year   Active Member of Clubs or Organizations: No   Attends Music therapist: More than 4 times per year   Marital Status: Widowed  Intimate Partner Violence: Not on file     Review of Systems: General: negative for chills, fever, night sweats or weight changes.  Cardiovascular: negative for chest pain, dyspnea on exertion, edema, orthopnea, palpitations, paroxysmal nocturnal dyspnea or shortness of breath Dermatological: negative for rash  Respiratory: negative for cough or wheezing Urologic: negative for hematuria Abdominal: negative for nausea, vomiting, diarrhea, bright red blood per rectum, melena, or hematemesis Neurologic: negative for visual changes, syncope, or dizziness All other systems reviewed and are otherwise negative except as noted above.    Blood pressure 134/68, pulse (!) 51, height 5\' 1"  (1.549 m), weight 123 lb 3.2 oz (55.9 kg), SpO2 98 %.  General appearance: alert and no distress Neck: no adenopathy, no JVD, supple, symmetrical, trachea midline, thyroid not enlarged, symmetric, no tenderness/mass/nodules, and right carotid bruit Lungs: clear to auscultation bilaterally Heart: High-pitched 2 out of 6 systolic ejection murmur at the base consistent with aortic stenosis Extremities: extremities normal, atraumatic, no cyanosis or edema Pulses: 2+ and symmetric Skin: Skin color, texture, turgor normal. No rashes or lesions Neurologic: Grossly normal  EKG started sinus bradycardia 51 with left axis deviation and septal Q waves.  Personally reviewed this EKG.  ASSESSMENT AND PLAN:   Dyslipidemia History of dyslipidemia on statin therapy with lipid profile performed 02/15/2021 revealing total cholesterol 116, LDL 65 and HDL 32.  Essential hypertension History of  essential hypertension a blood pressure measured today 130 34/68.  She is on amlodipine, metoprolol and losartan.  AAA (abdominal aortic aneurysm) without rupture (HCC) History of abdominal aortic aneurysm measuring 5.5 cm followed by Dr. Standley Dakins  Tobacco use disorder Ongoing tobacco abuse of 1/2 pack/day recalcitrant risk factor modification  CAD S/P percutaneous coronary angioplasty History of CAD status post mild CAD by cath 1997.  She apparently had PCI of circumflex obtuse marginal branch at [facility] 01/25/2018.  She denies chest pain.  Severe aortic stenosis History of severe aortic stenosis with recent echo performed 01/10/2021 revealing an aortic valve area of 0.54 cm with a mean gradient of 40 mmHg.  She is completely asymptomatic however.  We will continue to follow this noninvasively.     Lorretta Harp MD FACP,FACC,FAHA, [facility] 02/28/2021 12:57 PM

## 2021-02-28 NOTE — Assessment & Plan Note (Signed)
History of severe aortic stenosis with recent echo performed 01/10/2021 revealing an aortic valve area of 0.54 cm with a mean gradient of 40 mmHg.  She is completely asymptomatic however.  We will continue to follow this noninvasively.

## 2021-03-02 DIAGNOSIS — E785 Hyperlipidemia, unspecified: Secondary | ICD-10-CM

## 2021-03-02 DIAGNOSIS — Z9861 Coronary angioplasty status: Secondary | ICD-10-CM | POA: Diagnosis not present

## 2021-03-02 DIAGNOSIS — I2510 Atherosclerotic heart disease of native coronary artery without angina pectoris: Secondary | ICD-10-CM

## 2021-03-02 DIAGNOSIS — M1990 Unspecified osteoarthritis, unspecified site: Secondary | ICD-10-CM | POA: Diagnosis not present

## 2021-03-02 DIAGNOSIS — I10 Essential (primary) hypertension: Secondary | ICD-10-CM

## 2021-03-09 ENCOUNTER — Other Ambulatory Visit: Payer: Self-pay

## 2021-03-09 ENCOUNTER — Ambulatory Visit (HOSPITAL_COMMUNITY)
Admission: RE | Admit: 2021-03-09 | Discharge: 2021-03-09 | Disposition: A | Discharge status: Home / Self Care | Payer: Medicare Other | Source: Ambulatory Visit | Attending: Radiation Oncology | Admitting: Radiation Oncology

## 2021-03-09 ENCOUNTER — Encounter (HOSPITAL_COMMUNITY): Payer: Self-pay

## 2021-03-09 DIAGNOSIS — I700 Atherosclerosis of aorta: Secondary | ICD-10-CM | POA: Diagnosis not present

## 2021-03-09 DIAGNOSIS — R918 Other nonspecific abnormal finding of lung field: Secondary | ICD-10-CM | POA: Diagnosis not present

## 2021-03-09 DIAGNOSIS — C3432 Malignant neoplasm of lower lobe, left bronchus or lung: Secondary | ICD-10-CM | POA: Insufficient documentation

## 2021-03-09 DIAGNOSIS — C3490 Malignant neoplasm of unspecified part of unspecified bronchus or lung: Secondary | ICD-10-CM | POA: Diagnosis not present

## 2021-03-09 DIAGNOSIS — R911 Solitary pulmonary nodule: Secondary | ICD-10-CM | POA: Diagnosis not present

## 2021-03-10 NOTE — Progress Notes (Signed)
Radiation Oncology         [phone] ________________________________  Name: Tammy Brooks MRN: [id number]  Date: 03/12/2021  DOB: 02-18-1934  Follow-Up Visit Note  CC: Plotnikov, Evie Lacks, MD  Garner Nash, DO    ICD-10-CM   1. 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 To review, chest CT performed on 11/30/20 revealed several new tiny bilateral pulmonary nodules measuring up to 6 mm. Otherwise, CT showed response to the 2 areas treated with SBRT.   Since her last visit, the patient followed up with Geraldo Pitter NP, [facility], on 01/10/21. During which time, the patient denied any symptoms and reported cutting back her smoking to half a pack per day.                             Chest CT on 03/09/21 demonstrated an interval decrease in size of the irregular right upper lobe pulmonary nodule with development of surrounding interstitial and airspace opacity, consistent with post radiation scarring. The left lower lobe nodules were seen as stable in the interval as well. No new suspicious pulmonary nodule, mass or pleural effusions were otherwise visualized.    Allergies:  is allergic to nabumetone and tape.  Meds: Current Outpatient Medications  Medication Sig Dispense Refill   acetaminophen (TYLENOL) 325 MG tablet Take 325 mg by mouth every 6 (six) hours as needed (pain.).      allopurinol (ZYLOPRIM) 100 MG tablet Take 0.5 tablets (50 mg total) by mouth daily. 45 tablet 3   amLODipine (NORVASC) 5 MG tablet Take 1 tablet (5 mg total) by mouth daily. 90 tablet 3   atorvastatin (LIPITOR) 40 MG tablet Take 2 tablets (80 mg total) by mouth daily. 180 tablet 3   Cholecalciferol (VITAMIN D3) 50 MCG (2000 UT) TABS Take 2,000 Units by mouth daily.     clopidogrel (PLAVIX) 75 MG tablet Take 1 tablet (75 mg total) by mouth daily. 90 tablet 3   furosemide (LASIX) 20 MG tablet TAKE 1-2 TABLETS BY MOUTH  DAILY AS NEEDED FOR EDEMA. 180 tablet 1   isosorbide mononitrate (IMDUR) 30 MG 24 hr tablet Take 1 tablet (30 mg total) by mouth daily. 90 tablet 3   levothyroxine (SYNTHROID) 50 MCG tablet Take 1 tablet (50 mcg total) by mouth daily. 90 tablet 3   losartan (COZAAR) 25 MG tablet Take 1 tablet (25 mg total) by mouth daily. 90 tablet 3   metoprolol tartrate (LOPRESSOR) 25 MG tablet TAKE ONE-HALF TABLET BY  MOUTH TWICE DAILY 90 tablet 3   Multiple Vitamins-Minerals (PRESERVISION/LUTEIN) CAPS Take 1 capsule by mouth 3 (three) times a week. In the morning     Naproxen Sodium (ALEVE PO) Take 220 mg by mouth 2 (two) times daily as  needed.     nitroGLYCERIN (NITROSTAT) 0.4 MG SL tablet Place 1 tablet (0.4 mg total) under the tongue every 5 (five) minutes x 3 doses as needed for chest pain. 20 tablet 3   tiZANidine (ZANAFLEX) 4 MG tablet Take 1 tablet (4 mg total) by mouth every 8 (eight) hours as needed for muscle spasms. 90 tablet 2   traMADol (ULTRAM) 50 MG tablet Take 0.5-1 tablets (25-50 mg total) by mouth every 8 (eight) hours as needed. 100 tablet 2   No current facility-administered medications for this encounter.    Physical Findings: The patient is in no acute distress. Patient is alert and oriented.  height is 5\' 1"  (1.549 m) and weight is 123 lb (55.8 kg). Her temperature is 97.4 F (36.3 C) (abnormal). Her blood pressure is 131/63 and her pulse is 63. Her respiration is 20 and  oxygen saturation is 98%. .  No significant changes. Lungs are clear to auscultation bilaterally. Heart has regular rate and rhythm. No palpable cervical, supraclavicular, or axillary adenopathy. Abdomen soft, non-tender, normal bowel sounds.     Lab Findings: Lab Results  Component Value Date   WBC 7.0 02/15/2021   HGB 13.2 02/15/2021   HCT 40.2 02/15/2021   MCV 85.6 02/15/2021   PLT 177.0 02/15/2021    Radiographic Findings: CT CHEST WO CONTRAST  Result Date: 03/12/2021 CLINICAL DATA:  Non-small-cell lung cancer.  Restaging. EXAM: CT CHEST WITHOUT CONTRAST TECHNIQUE: Multidetector CT imaging of the chest was performed following the standard protocol without IV contrast. COMPARISON:  11/30/2020 FINDINGS: Cardiovascular: The heart size is normal. No substantial pericardial effusion. Coronary artery calcification is evident. Mitral and aortic valve calcification evident. Advanced atherosclerotic calcification is noted in the wall of the thoracic aorta. Stable penetrating ulcer or ulcerated plaque distal transverse aorta. Mediastinum/Nodes: No mediastinal lymphadenopathy. No evidence for gross hilar lymphadenopathy although assessment is limited by the lack of intravenous contrast on the current study. The esophagus has normal imaging features. There is no axillary lymphadenopathy. Lungs/Pleura: Centrilobular emphsyema noted. Irregular right upper lobe pulmonary nodule measured previously at 18 x 12 mm is approximately 12 x 10 mm today although partially obscured by the well vein post radiation fibrosis. The adjacent tiny right upper lobe nodules seen previously have resolved in some areas and become incorporated into the radiation scar in other areas. Posterior right upper lobe nodule measured at 4 mm previously is 2 mm on image 60/7 today. 6 mm anterior right upper lobe nodule on 58/7 today was 6 mm previously. Ill-defined anterior left lower lobe nodule described previously at 11 x 10 mm is similar  at 12 x 10 mm today (108/7). 6 mm left lower lobe nodule noted on the prior study is stable at 6 mm today (103/7). No new suspicious nodule or mass. No focal airspace consolidation. No pleural effusion. Upper Abdomen: Abdominal aortic aneurysm again noted, incompletely visualized but measuring at least 6.3 cm diameter on image 166/2. Musculoskeletal: No worrisome lytic or sclerotic osseous abnormality. IMPRESSION: 1. Interval decrease in size of the irregular right upper lobe pulmonary nodule with development of surrounding interstitial and airspace opacity consistent with post radiation scarring. 2. Left lower lobe pulmonary nodules are stable in the interval 3. No new suspicious pulmonary nodule or mass.  No pleural effusion. 4. Abdominal aortic aneurysm, incompletely visualized but measuring at least 6.3 cm diameter. 5. Aortic Atherosclerosis (ICD10-I70.0). Electronically Signed   By: Misty Stanley M.D.   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  Recent chest CT scan shows a favorable response to SBRT treatments for both of her lesions one in the right upper lobe and one in left lower lobe locations.  Plan: The patient will return for routine follow-up in 6 months.  Prior to this follow-up she will undergo a repeat chest CT scan.   23 minutes of total time was spent for this patient encounter, including preparation, face-to-face counseling with the patient and coordination of care, physical exam, and documentation of the encounter. ____________________________________  Blair Promise, PhD, MD   This document serves as a record of services personally performed by Gery Pray, MD. It was created on his behalf by Roney Mans, a trained medical scribe. The creation of this record is based on the scribe's personal observations and the provider's statements to them. This  document has been checked and approved by the attending provider.

## 2021-03-12 ENCOUNTER — Ambulatory Visit
Admission: RE | Admit: 2021-03-12 | Discharge: 2021-03-12 | Disposition: A | Discharge status: Home / Self Care | Payer: Medicare Other | Source: Ambulatory Visit | Attending: Radiation Oncology | Admitting: Radiation Oncology

## 2021-03-12 ENCOUNTER — Encounter: Payer: Self-pay | Admitting: Radiation Oncology

## 2021-03-12 ENCOUNTER — Other Ambulatory Visit: Payer: Self-pay

## 2021-03-12 VITALS — BP 131/63 | HR 63 | Temp 97.4°F | Resp 20 | Ht 61.0 in | Wt 123.0 lb

## 2021-03-12 DIAGNOSIS — C3411 Malignant neoplasm of upper lobe, right bronchus or lung: Secondary | ICD-10-CM | POA: Diagnosis not present

## 2021-03-12 DIAGNOSIS — Z85118 Personal history of other malignant neoplasm of bronchus and lung: Secondary | ICD-10-CM | POA: Diagnosis not present

## 2021-03-12 DIAGNOSIS — Z08 Encounter for follow-up examination after completed treatment for malignant neoplasm: Secondary | ICD-10-CM | POA: Diagnosis not present

## 2021-03-12 DIAGNOSIS — C3432 Malignant neoplasm of lower lobe, left bronchus or lung: Secondary | ICD-10-CM | POA: Diagnosis not present

## 2021-03-12 DIAGNOSIS — I714 Abdominal aortic aneurysm, without rupture, unspecified: Secondary | ICD-10-CM | POA: Diagnosis not present

## 2021-03-12 DIAGNOSIS — Z923 Personal history of irradiation: Secondary | ICD-10-CM | POA: Diagnosis not present

## 2021-03-12 DIAGNOSIS — Z79899 Other long term (current) drug therapy: Secondary | ICD-10-CM | POA: Insufficient documentation

## 2021-03-12 DIAGNOSIS — I700 Atherosclerosis of aorta: Secondary | ICD-10-CM | POA: Insufficient documentation

## 2021-03-12 NOTE — Progress Notes (Signed)
Tammy Brooks is here today for follow up post radiation to the lung.  Lung Side: right  Radiation Therapy completed on 07/27/2020  Does the patient complain of any of the following: Pain:denies Shortness of breath w/wo exertion: denies Cough: denies Hemoptysis: denies Pain with swallowing: denies Swallowing/choking concerns: denies Appetite: fair Energy Level: good Post radiation skin Changes: denies    Additional comments if applicable: none  Vitals:   03/12/21 1140  BP: 131/63  Pulse: 63  Resp: 20  Temp: (!) 97.4 F (36.3 C)  SpO2: 98%  Weight: 123 lb (55.8 kg)  Height: 5\' 1"  (1.549 m)

## 2021-04-12 DIAGNOSIS — Z961 Presence of intraocular lens: Secondary | ICD-10-CM | POA: Diagnosis not present

## 2021-04-12 DIAGNOSIS — H353113 Nonexudative age-related macular degeneration, right eye, advanced atrophic without subfoveal involvement: Secondary | ICD-10-CM | POA: Diagnosis not present

## 2021-04-12 DIAGNOSIS — H338 Other retinal detachments: Secondary | ICD-10-CM | POA: Diagnosis not present

## 2021-05-16 ENCOUNTER — Telehealth: Payer: Medicare Other

## 2021-06-02 DIAGNOSIS — J9811 Atelectasis: Secondary | ICD-10-CM | POA: Diagnosis not present

## 2021-06-03 DIAGNOSIS — I3131 Malignant pericardial effusion in diseases classified elsewhere: Secondary | ICD-10-CM | POA: Diagnosis not present

## 2021-06-03 DIAGNOSIS — R9389 Abnormal findings on diagnostic imaging of other specified body structures: Secondary | ICD-10-CM

## 2021-06-03 DIAGNOSIS — Z8673 Personal history of transient ischemic attack (TIA), and cerebral infarction without residual deficits: Secondary | ICD-10-CM | POA: Diagnosis not present

## 2021-06-03 DIAGNOSIS — I714 Abdominal aortic aneurysm, without rupture, unspecified: Secondary | ICD-10-CM | POA: Diagnosis not present

## 2021-06-03 DIAGNOSIS — R0789 Other chest pain: Secondary | ICD-10-CM | POA: Diagnosis not present

## 2021-06-03 DIAGNOSIS — I2510 Atherosclerotic heart disease of native coronary artery without angina pectoris: Secondary | ICD-10-CM | POA: Diagnosis not present

## 2021-06-03 DIAGNOSIS — Z792 Long term (current) use of antibiotics: Secondary | ICD-10-CM | POA: Diagnosis not present

## 2021-06-03 DIAGNOSIS — M1990 Unspecified osteoarthritis, unspecified site: Secondary | ICD-10-CM | POA: Diagnosis not present

## 2021-06-03 DIAGNOSIS — I7142 Juxtarenal abdominal aortic aneurysm, without rupture: Secondary | ICD-10-CM | POA: Diagnosis not present

## 2021-06-03 DIAGNOSIS — I361 Nonrheumatic tricuspid (valve) insufficiency: Secondary | ICD-10-CM | POA: Diagnosis not present

## 2021-06-03 DIAGNOSIS — E785 Hyperlipidemia, unspecified: Secondary | ICD-10-CM | POA: Diagnosis not present

## 2021-06-03 DIAGNOSIS — J9601 Acute respiratory failure with hypoxia: Secondary | ICD-10-CM | POA: Diagnosis not present

## 2021-06-03 DIAGNOSIS — R059 Cough, unspecified: Secondary | ICD-10-CM | POA: Diagnosis not present

## 2021-06-03 DIAGNOSIS — R911 Solitary pulmonary nodule: Secondary | ICD-10-CM | POA: Diagnosis not present

## 2021-06-03 DIAGNOSIS — I959 Hypotension, unspecified: Secondary | ICD-10-CM | POA: Diagnosis not present

## 2021-06-03 DIAGNOSIS — R079 Chest pain, unspecified: Secondary | ICD-10-CM | POA: Diagnosis not present

## 2021-06-03 DIAGNOSIS — I517 Cardiomegaly: Secondary | ICD-10-CM | POA: Diagnosis not present

## 2021-06-03 DIAGNOSIS — I10 Essential (primary) hypertension: Secondary | ICD-10-CM | POA: Diagnosis not present

## 2021-06-03 DIAGNOSIS — F17210 Nicotine dependence, cigarettes, uncomplicated: Secondary | ICD-10-CM | POA: Diagnosis not present

## 2021-06-03 DIAGNOSIS — Z79899 Other long term (current) drug therapy: Secondary | ICD-10-CM | POA: Diagnosis not present

## 2021-06-03 DIAGNOSIS — J449 Chronic obstructive pulmonary disease, unspecified: Secondary | ICD-10-CM | POA: Diagnosis not present

## 2021-06-03 DIAGNOSIS — R0902 Hypoxemia: Secondary | ICD-10-CM | POA: Diagnosis not present

## 2021-06-03 DIAGNOSIS — Z7902 Long term (current) use of antithrombotics/antiplatelets: Secondary | ICD-10-CM | POA: Diagnosis not present

## 2021-06-03 DIAGNOSIS — U071 COVID-19: Secondary | ICD-10-CM | POA: Diagnosis not present

## 2021-06-03 DIAGNOSIS — I340 Nonrheumatic mitral (valve) insufficiency: Secondary | ICD-10-CM | POA: Diagnosis not present

## 2021-06-03 DIAGNOSIS — R404 Transient alteration of awareness: Secondary | ICD-10-CM | POA: Diagnosis not present

## 2021-06-03 DIAGNOSIS — I3139 Other pericardial effusion (noninflammatory): Secondary | ICD-10-CM | POA: Diagnosis not present

## 2021-06-03 DIAGNOSIS — R062 Wheezing: Secondary | ICD-10-CM | POA: Diagnosis not present

## 2021-06-03 DIAGNOSIS — K5730 Diverticulosis of large intestine without perforation or abscess without bleeding: Secondary | ICD-10-CM | POA: Diagnosis not present

## 2021-06-03 DIAGNOSIS — J439 Emphysema, unspecified: Secondary | ICD-10-CM | POA: Diagnosis not present

## 2021-06-03 DIAGNOSIS — J9811 Atelectasis: Secondary | ICD-10-CM | POA: Diagnosis not present

## 2021-06-03 DIAGNOSIS — I252 Old myocardial infarction: Secondary | ICD-10-CM | POA: Diagnosis not present

## 2021-06-03 HISTORY — DX: Abnormal findings on diagnostic imaging of other specified body structures: R93.89

## 2021-06-04 DIAGNOSIS — U071 COVID-19: Secondary | ICD-10-CM | POA: Diagnosis not present

## 2021-06-04 DIAGNOSIS — J9601 Acute respiratory failure with hypoxia: Secondary | ICD-10-CM | POA: Diagnosis not present

## 2021-06-04 DIAGNOSIS — I3139 Other pericardial effusion (noninflammatory): Secondary | ICD-10-CM | POA: Diagnosis not present

## 2021-06-05 DIAGNOSIS — I3139 Other pericardial effusion (noninflammatory): Secondary | ICD-10-CM | POA: Diagnosis not present

## 2021-06-05 DIAGNOSIS — J9811 Atelectasis: Secondary | ICD-10-CM | POA: Diagnosis not present

## 2021-06-05 DIAGNOSIS — U071 COVID-19: Secondary | ICD-10-CM | POA: Diagnosis not present

## 2021-06-05 DIAGNOSIS — J9601 Acute respiratory failure with hypoxia: Secondary | ICD-10-CM | POA: Diagnosis not present

## 2021-06-13 ENCOUNTER — Telehealth: Payer: Medicare Other

## 2021-06-13 NOTE — Progress Notes (Deleted)
Chronic Care Management Pharmacy Note  06/13/2021 Name:  Tammy Brooks MRN:  [id number] DOB:  04/05/1934  Summary: - Patient reports that she continued to smoke about 1/2 ppd - working to quit but does not feel she is ready to quit at his time  -Patient has been monitoring blood pressure, noted that BP is at goal typically averaging ~120/70-75 -Patient reports that gout and pain have been well controlled, has had no instances of gout attack recently, only using tramadol/naproxen/ APAP about 1 time per month if at all -Reports that she has been taking levothyroxine together with all of her other AM medications   Recommendations/Changes made from today's visit: -Recommending for patient to separate levothyroxine dose from other AM medications and foods by 30-60 minutes for better absorption of medication -discussed with patient benefits of smoking cessation as well as treatment/medications available to help aid in smoking cessation, patient was also given contact information for [place] [facility]uit [facility] (1-800-QUIT-NOW) and encouraged patient to reach out to this group for support.  Patient encouraged to pick a quit date  for next appointment -Patient to continue to monitor blood pressure and reach out should BP elevate from goal prior to next appointment  Subjective: Tammy Brooks is an 88 y.o. year old female who is a primary patient of Plotnikov, Evie Lacks, MD.  The CCM team was consulted for assistance with disease management and care coordination needs.    Engaged with patient by telephone for follow up visit in response to provider referral for pharmacy case management and/or care coordination services.   Consent to Services:  The patient was given the following information about Chronic Care Management services today, agreed to services, and gave verbal consent: 1. CCM service includes personalized support from designated clinical staff supervised by the primary care provider, including individualized  plan of care and coordination with other care providers 2. 24/7 contact phone numbers for assistance for urgent and routine care needs. 3. Service will only be billed when office clinical staff spend 20 minutes or more in a month to coordinate care. 4. Only one practitioner may furnish and bill the service in a calendar month. 5.The patient may stop CCM services at any time (effective at the end of the month) by phone call to the office staff. 6. The patient will be responsible for cost sharing (co-pay) of up to 20% of the service fee (after annual deductible is met). Patient agreed to services and consent obtained.  Patient Care Team: Plotnikov, Evie Lacks, MD as PCP - General (Internal Medicine) Lorretta Harp, MD as PCP - Cardiology (Cardiology) Bond, Tracie Harrier, MD as Referring Physician (Ophthalmology) Gerarda Fraction, MD as Referring Physician (Ophthalmology) Marti Sleigh, MD as Attending Physician (Gynecology) Early, Arvilla Meres, MD as Consulting Physician (Vascular Surgery) Ralene Ok, MD as Consulting Physician (General Surgery) Erlene Quan, PA-C (Inactive) as Physician Assistant (Cardiology) Drake Leach, [place] as Consulting Physician (Optometry) Adelena Desantiago, Darnelle Maffucci, [facility] as Pharmacist (Pharmacist) Gery Pray, MD as Consulting Physician (Radiation Oncology)  Recent office visits:  02/15/2021 - Dr. Alain Marion - allopurinol reduced to 46m daily  - follow up in 6 months    Recent consult visits:  [facility]2021 [facility]  02/28/2021 - Dr. BGwenlyn Found- Cardiology - no changes to medications - ECHO ordered - follow up in 6 months    Hospital visits:  None in previous 6 months    Objective:  Lab Results  Component Value Date   CREATININE 1.15 02/15/2021   BUN 18 02/15/2021   GFR 43.48 (L) 02/15/2021   GFRNONAA 56 (L) 06/13/2020   GFRAA 67 01/12/2020   NA 141 02/15/2021   K 4.7 02/15/2021   CALCIUM 9.3 02/15/2021   CO2 26  02/15/2021   GLUCOSE 94 02/15/2021    Lab Results  Component Value Date/Time   GFR 43.48 (L) 02/15/2021 03:45 PM   GFR 50.51 (L) 07/12/2019 10:23 AM    Last diabetic Eye exam:  No results found for: HMDIABEYEEXA  Last diabetic Foot exam:  No results found for: HMDIABFOOTEX   Lab Results  Component Value Date   CHOL 116 02/15/2021   HDL 32.20 (L) 02/15/2021   LDLCALC 65 02/15/2021   LDLDIRECT 76.7 11/17/2008   TRIG 91.0 02/15/2021   CHOLHDL 4 02/15/2021    Hepatic Function Latest Ref Rng & Units 02/15/2021 05/18/2020 01/12/2020  Total Protein 6.0 - 8.3 g/dL 6.4 - 6.2  Albumin 3.5 - 5.2 g/dL 3.8 4.0 -  AST 0 - 37 U/L 15 24 18   ALT 0 - 35 U/L 9 14 14   Alk Phosphatase 39 - 117 U/L 56 68 -  Total Bilirubin 0.2 - 1.2 mg/dL 0.5 - 0.5  Bilirubin, Direct 0.0 - 0.3 mg/dL - - -    Lab Results  Component Value Date/Time   TSH 4.12 02/15/2021 03:45 PM   TSH 7.68 (H) 07/12/2019 10:23 AM   FREET4 1.24 07/12/2019 10:23 AM   FREET4 1.04 01/07/2019 01:15 PM    CBC Latest Ref Rng & Units 02/15/2021 06/13/2020 05/18/2020  WBC 4.0 - 10.5 K/uL 7.0 7.2 8.6  Hemoglobin 12.0 - 15.0 g/dL 13.2 13.1 13.6  Hematocrit 36.0 - 46.0 % 40.2 43.8 42  Platelets 150.0 - 400.0 K/uL 177.0 220 217    Lab Results  Component Value Date/Time   VD25OH 43 01/12/2020 11:30 AM   VD25OH 20.51 (L) 07/12/2019 10:23 AM    Clinical ASCVD: Yes  The ASCVD Risk score (Arnett DK, et al., 2019) failed to calculate for the following reasons:   The 2019 ASCVD risk score is only valid for ages 4 to 31   The patient has a prior MI or stroke diagnosis    Depression screen [facility] 2/9 08/03/2020 07/24/2020 01/12/2020  Decreased Interest 0 0 0  Down, Depressed, Hopeless 0 0 0  PHQ - 2 Score 0 0 0  Altered sleeping - - -  Tired, decreased energy - - -  Change in appetite - - -  Feeling bad or failure about yourself  - - -  Trouble concentrating - - -  Moving slowly or fidgety/restless - - -  Suicidal thoughts - - -   PHQ-9 Score - - -  Difficult doing work/chores - - -  Some recent data might be hidden   Social History   Tobacco Use  Smoking Status Every Day   Packs/day: 0.50   Years: 68.00   Pack years: 34.00   Types: Cigarettes  Smokeless Tobacco Never   BP Readings from Last 3 Encounters:  03/12/21 131/63  02/28/21 134/68  02/15/21 132/72   Pulse Readings from Last 3 Encounters:  03/12/21 63  02/28/21 (!) 51  02/15/21 60   Wt Readings from Last 3 Encounters:  03/12/21 123 lb (55.8 kg)  02/28/21 123 lb 3.2 oz (55.9 kg)  02/15/21 132 lb 3.2 oz (60 kg)   BMI Readings from Last 3 Encounters:  03/12/21 23.24 kg/m  02/28/21 23.28  kg/m  02/15/21 24.98 kg/m    Assessment/Interventions: Review of patient past medical history, allergies, medications, health status, including review of consultants reports, laboratory and other test data, was performed as part of comprehensive evaluation and provision of chronic care management services.   SDOH:  (Social Determinants of Health) assessments and interventions performed: Yes  SDOH Screenings   Alcohol Screen: Low Risk    Last Alcohol Screening Score (AUDIT): 0  Depression (PHQ2-9): Low Risk    PHQ-2 Score: 0  Financial Resource Strain: Low Risk    Difficulty of Paying Living Expenses: Not hard at all  Food Insecurity: No Food Insecurity   Worried About Charity fundraiser in the Last Year: Never true   Ran Out of Food in the Last Year: Never true  Housing: Low Risk    Last Housing Risk Score: 0  Physical Activity: Sufficiently Active   Days of Exercise per Week: 5 days   Minutes of Exercise per Session: 30 min  Social Connections: Moderately Integrated   Frequency of Communication with Friends and Family: More than three times a week   Frequency of Social Gatherings with Friends and Family: More than three times a week   Attends Religious Services: More than 4 times per year   Active Member of Clubs or Organizations: No    Attends Music therapist: More than 4 times per year   Marital Status: Widowed  Stress: No Stress Concern Present   Feeling of Stress : Not at all  Tobacco Use: High Risk   Smoking Tobacco Use: Every Day   Smokeless Tobacco Use: Never   Passive Exposure: Not on file  Transportation Needs: No Transportation Needs   Lack of Transportation (Medical): No   Lack of Transportation (Non-Medical): No    CCM Care Plan  Allergies  Allergen Reactions   Nabumetone Anaphylaxis, Swelling and Other (See Comments)    Causes inflammation of airways   Tape Other (See Comments)    SKIN IS VERY THIN AND TEARS EASILY- PLEASE USE AN ALTERNATIVE TO TAPE, IF POSSIBLE!!    Medications Reviewed Today     Reviewed by Gery Pray, MD (Physician) on 03/12/21 at [place] List Status: <None>   Medication Order Taking? Sig Documenting Provider Last Dose Status Informant  acetaminophen (TYLENOL) 325 MG tablet [id number] Yes Take 325 mg by mouth every 6 (six) hours as needed (pain.). [provider] Taking Active   allopurinol (ZYLOPRIM) 100 MG tablet [id number] Yes Take 0.5 tablets (50 mg total) by mouth daily. Plotnikov, Evie Lacks, MD Taking Active   amLODipine (NORVASC) 5 MG tablet [id number] Yes Take 1 tablet (5 mg total) by mouth daily. Plotnikov, Evie Lacks, MD Taking Active   atorvastatin (LIPITOR) 40 MG tablet [id number] Yes Take 2 tablets (80 mg total) by mouth daily. Plotnikov, Evie Lacks, MD Taking Active   Cholecalciferol (VITAMIN D3) 50 MCG (2000 UT) TABS [id number] Yes Take 2,000 Units by mouth daily. [provider] Taking Active Self  clopidogrel (PLAVIX) 75 MG tablet [id number] Yes Take 1 tablet (75 mg total) by mouth daily. Plotnikov, Evie Lacks, MD Taking Active   furosemide (LASIX) 20 MG tablet [id number] Yes TAKE 1-2 TABLETS BY MOUTH  DAILY AS NEEDED FOR EDEMA. Plotnikov, Evie Lacks, MD Taking Active   isosorbide mononitrate (IMDUR) 30 MG 24 hr tablet [id number] Yes Take 1  tablet (30 mg total) by mouth daily. Plotnikov, Evie Lacks, MD Taking Active   levothyroxine (SYNTHROID) 50 MCG tablet [id number] Yes  Take 1 tablet (50 mcg total) by mouth daily. Plotnikov, Evie Lacks, MD Taking Active   losartan (COZAAR) 25 MG tablet [id number] Yes Take 1 tablet (25 mg total) by mouth daily. Plotnikov, Evie Lacks, MD Taking Active   metoprolol tartrate (LOPRESSOR) 25 MG tablet [id number] Yes TAKE ONE-HALF TABLET BY  MOUTH TWICE DAILY Plotnikov, Evie Lacks, MD Taking Active   Multiple Vitamins-Minerals (PRESERVISION/LUTEIN) CAPS [id number] Yes Take 1 capsule by mouth 3 (three) times a week. In the morning [provider] Taking Active Self  Naproxen Sodium (ALEVE PO) [id number] Yes Take 220 mg by mouth 2 (two) times daily as needed. [provider] Taking Active   nitroGLYCERIN (NITROSTAT) 0.4 MG SL tablet [id number] Yes Place 1 tablet (0.4 mg total) under the tongue every 5 (five) minutes x 3 doses as needed for chest pain. Plotnikov, Evie Lacks, MD Taking Active   tiZANidine (ZANAFLEX) 4 MG tablet [id number] Yes Take 1 tablet (4 mg total) by mouth every 8 (eight) hours as needed for muscle spasms. Plotnikov, Evie Lacks, MD Taking Active   traMADol (ULTRAM) 50 MG tablet [id number] Yes Take 0.5-1 tablets (25-50 mg total) by mouth every 8 (eight) hours as needed. Plotnikov, Evie Lacks, MD Taking Active             Patient Active Problem List   Diagnosis Date Noted   Non-small cell lung cancer ([place]) 01/10/2021   Primary cancer of right upper lobe of lung ([place]) 07/09/2020   Primary cancer of left lower lobe of lung ([place]) 06/29/2020   Multiple lung nodules 06/13/2020   Lung nodules    Paresthesias 01/12/2020   Vitamin D deficiency 01/12/2020   Chest pain 08/31/2018   CRI (chronic renal insufficiency), stage 2 (mild) 06/29/2018   Severe aortic stenosis 06/08/2018   History of ST elevation myocardial infarction (STEMI) 02/11/2018   CAD S/P percutaneous coronary angioplasty  02/11/2018   Weakness of extremity 10/21/2017   Rash and nonspecific skin eruption 10/07/2017   Tobacco use disorder 08/21/2017   Laceration of hand 04/09/2016   Well adult exam 01/30/2016   Actinic keratoses 01/30/2016   Edema 11/30/2014   Inguinal hernia 08/30/2014   Low back pain 06/02/2014   Bradycardia 06/02/2014   Pelvic mass in female 04/26/2014   Bilateral lower extremity edema 03/09/2013   Hx of carotid artery stenosis, RT. -moderate, followed by Dr. Gwenlyn Found, Last dopplers 02/2012 03/09/2013   Seasonal allergies 03/09/2013   AAA (abdominal aortic aneurysm) without rupture 03/04/2013   Gout 03/04/2013   Osteoarthritis 03/04/2013   Need for prophylactic vaccination and inoculation against influenza 03/18/2012   Chest pain, atypical 03/14/2012   Glaucoma 11/21/2009   UNSPECIFIED URINARY INCONTINENCE 01/20/2008   Dyslipidemia 10/08/2007   Essential hypertension 10/08/2007   Peripheral vascular disease ([place]) 10/08/2007   H/O: CVA (cerebrovascular accident) 10/08/2007   COLONIC POLYPS, HX OF 10/08/2007    Immunization History  Administered Date(s) Administered   Fluad Quad(high Dose 65+) 02/23/2019, 02/15/2021   Influenza Split 03/18/2012   Influenza Whole 03/04/2009   Influenza, High Dose Seasonal PF 02/04/2017, 03/11/2018   Influenza,inj,Quad PF,6+ Mos 04/13/2014, 01/30/2016   Influenza-Unspecified 03/03/2013, 05/09/2015, 04/18/2020   Moderna Sars-Covid-2 Vaccination 08/07/2019, 09/04/2019, 04/18/2020   Pneumococcal Conjugate-13 08/30/2014   Pneumococcal Polysaccharide-23 11/17/2008, 08/21/2017   Td 11/17/2008   Tdap 12/30/2017   Zoster, Live 11/21/2009    Conditions to be addressed/monitored:  Hypertension, Hyperlipidemia, Hypothyroidism, Gout, Chronic Pain, Muscle Spasm, Tobacco Use, and History of Sroke   There are no care  plans that you recently modified to display for this patient.  Medication Assistance: None required.  Patient affirms current coverage meets  needs.  Patient's preferred pharmacy is:  [id number] [address], [address] [place] [address] Phone: [phone] Fax: [phone]  [facility] ([place], [place] [facility] [address] [place] [address] [place] [address] Phone: [phone] Fax: [phone]   Uses pill box? Yes Pt endorses 100% compliance  Care Plan and Follow Up Patient Decision:  Patient agrees to Care Plan and Follow-up.  Plan: Telephone follow up appointment with care management team member scheduled for:  3 months and The patient has been provided with contact information for the care management team and has been advised to call with any health related questions or concerns.   Tomasa Blase, PharmD Clinical Pharmacist, [place]

## 2021-06-20 ENCOUNTER — Encounter: Payer: Self-pay | Admitting: Internal Medicine

## 2021-06-20 ENCOUNTER — Ambulatory Visit (INDEPENDENT_AMBULATORY_CARE_PROVIDER_SITE_OTHER): Payer: Medicare Other | Admitting: Internal Medicine

## 2021-06-20 ENCOUNTER — Other Ambulatory Visit: Payer: Self-pay

## 2021-06-20 VITALS — BP 118/66 | HR 78 | Temp 98.2°F | Ht 61.0 in | Wt 120.8 lb

## 2021-06-20 DIAGNOSIS — E559 Vitamin D deficiency, unspecified: Secondary | ICD-10-CM | POA: Diagnosis not present

## 2021-06-20 DIAGNOSIS — Z8616 Personal history of COVID-19: Secondary | ICD-10-CM

## 2021-06-20 DIAGNOSIS — C3411 Malignant neoplasm of upper lobe, right bronchus or lung: Secondary | ICD-10-CM

## 2021-06-20 DIAGNOSIS — D508 Other iron deficiency anemias: Secondary | ICD-10-CM

## 2021-06-20 DIAGNOSIS — F17200 Nicotine dependence, unspecified, uncomplicated: Secondary | ICD-10-CM | POA: Diagnosis not present

## 2021-06-20 DIAGNOSIS — M1990 Unspecified osteoarthritis, unspecified site: Secondary | ICD-10-CM

## 2021-06-20 LAB — CBC WITH DIFFERENTIAL/PLATELET
Basophils Absolute: 0.1 10*3/uL (ref 0.0–0.1)
Basophils Relative: 0.6 % (ref 0.0–3.0)
Eosinophils Absolute: 0.1 10*3/uL (ref 0.0–0.7)
Eosinophils Relative: 0.9 % (ref 0.0–5.0)
HCT: 35.1 % — ABNORMAL LOW (ref 36.0–46.0)
Hemoglobin: 11.5 g/dL — ABNORMAL LOW (ref 12.0–15.0)
Lymphocytes Relative: 15.3 % (ref 12.0–46.0)
Lymphs Abs: 1.3 10*3/uL (ref 0.7–4.0)
MCHC: 32.7 g/dL (ref 30.0–36.0)
MCV: 84.7 fl (ref 78.0–100.0)
Monocytes Absolute: 0.7 10*3/uL (ref 0.1–1.0)
Monocytes Relative: 8.2 % (ref 3.0–12.0)
Neutro Abs: 6.6 10*3/uL (ref 1.4–7.7)
Neutrophils Relative %: 75 % (ref 43.0–77.0)
Platelets: 284 10*3/uL (ref 150.0–400.0)
RBC: 4.15 Mil/uL (ref 3.87–5.11)
RDW: 14.9 % (ref 11.5–15.5)
WBC: 8.8 10*3/uL (ref 4.0–10.5)

## 2021-06-20 LAB — COMPREHENSIVE METABOLIC PANEL
ALT: 8 U/L (ref 0–35)
AST: 13 U/L (ref 0–37)
Albumin: 3.6 g/dL (ref 3.5–5.2)
Alkaline Phosphatase: 52 U/L (ref 39–117)
BUN: 13 mg/dL (ref 6–23)
CO2: 25 mEq/L (ref 19–32)
Calcium: 8.9 mg/dL (ref 8.4–10.5)
Chloride: 107 mEq/L (ref 96–112)
Creatinine, Ser: 1 mg/dL (ref 0.40–1.20)
GFR: 51.3 mL/min — ABNORMAL LOW (ref >60.00)
Glucose, Bld: 101 mg/dL — ABNORMAL HIGH (ref 70–99)
Potassium: 4 mEq/L (ref 3.5–5.1)
Sodium: 142 mEq/L (ref 135–145)
Total Bilirubin: 0.4 mg/dL (ref 0.2–1.2)
Total Protein: 6.7 g/dL (ref 6.0–8.3)

## 2021-06-20 LAB — CK: Total CK: 28 U/L (ref 7–177)

## 2021-06-20 NOTE — Assessment & Plan Note (Signed)
On Vit D 

## 2021-06-20 NOTE — Patient Instructions (Signed)
Hold atorvastatin x 1 week

## 2021-06-20 NOTE — Progress Notes (Signed)
Subjective:  Patient ID: Tammy Brooks, female    DOB: 1934-01-14  Age: 88 y.o. MRN: [id number]  CC: Hospitalization Follow-up (Patient had COVID; patient c/o still having achy body)   HPI SYBRINA Brooks presents for post-Hosp stay at [facility] for COVID and confusion. C/o fatigue, achy bones; getting better.... Smoking 1/2 ppd  Outpatient Medications Prior to Visit  Medication Sig Dispense Refill   acetaminophen (TYLENOL) 325 MG tablet Take 325 mg by mouth every 6 (six) hours as needed (pain.).     allopurinol (ZYLOPRIM) 100 MG tablet Take 0.5 tablets (50 mg total) by mouth daily. 45 tablet 3   amLODipine (NORVASC) 5 MG tablet Take 1 tablet (5 mg total) by mouth daily. 90 tablet 3   atorvastatin (LIPITOR) 40 MG tablet Take 2 tablets (80 mg total) by mouth daily. 180 tablet 3   Cholecalciferol (VITAMIN D3) 50 MCG (2000 UT) TABS Take 2,000 Units by mouth daily.     clopidogrel (PLAVIX) 75 MG tablet Take 1 tablet (75 mg total) by mouth daily. 90 tablet 3   furosemide (LASIX) 20 MG tablet TAKE 1-2 TABLETS BY MOUTH  DAILY AS NEEDED FOR EDEMA. 180 tablet 1   isosorbide mononitrate (IMDUR) 30 MG 24 hr tablet Take 1 tablet (30 mg total) by mouth daily. 90 tablet 3   levothyroxine (SYNTHROID) 50 MCG tablet Take 1 tablet (50 mcg total) by mouth daily. 90 tablet 3   losartan (COZAAR) 25 MG tablet Take 1 tablet (25 mg total) by mouth daily. 90 tablet 3   metoprolol tartrate (LOPRESSOR) 25 MG tablet TAKE ONE-HALF TABLET BY  MOUTH TWICE DAILY 90 tablet 3   Multiple Vitamins-Minerals (PRESERVISION/LUTEIN) CAPS Take 1 capsule by mouth 3 (three) times a week. In the morning     Naproxen Sodium (ALEVE PO) Take 220 mg by mouth 2 (two) times daily as needed.     nitroGLYCERIN (NITROSTAT) 0.4 MG SL tablet Place 1 tablet (0.4 mg total) under the tongue every 5 (five) minutes x 3 doses as needed for chest pain. 20 tablet 3   tiZANidine (ZANAFLEX) 4 MG tablet Take 1 tablet (4 mg total) by mouth every 8  (eight) hours as needed for muscle spasms. 90 tablet 2   traMADol (ULTRAM) 50 MG tablet Take 0.5-1 tablets (25-50 mg total) by mouth every 8 (eight) hours as needed. 100 tablet 2   No facility-administered medications prior to visit.    ROS: Review of Systems  Constitutional:  Positive for fatigue. Negative for activity change, appetite change, chills and unexpected weight change.  HENT:  Negative for congestion, mouth sores and sinus pressure.   Eyes:  Positive for visual disturbance.  Respiratory:  Negative for cough, chest tightness, shortness of breath and wheezing.   Cardiovascular:  Negative for chest pain and leg swelling.  Gastrointestinal:  Negative for abdominal pain and nausea.  Genitourinary:  Negative for difficulty urinating, frequency and vaginal pain.  Musculoskeletal:  Positive for arthralgias, back pain, gait problem and myalgias.  Skin:  Negative for pallor and rash.  Neurological:  Negative for dizziness, tremors, weakness, numbness and headaches.  Psychiatric/Behavioral:  Negative for confusion, sleep disturbance and suicidal ideas.    Objective:  BP 118/66 (BP Location: Left Arm, Patient Position: Sitting, Cuff Size: Normal)    Pulse 78    Temp 98.2 F (36.8 C) (Oral)    Ht 5\' 1"  (1.549 m)    Wt 120 lb 12.8 oz (54.8 kg)    LMP  (  LMP Unknown)    SpO2 99%    BMI 22.82 kg/m   BP Readings from Last 3 Encounters:  06/20/21 118/66  03/12/21 131/63  02/28/21 134/68    Wt Readings from Last 3 Encounters:  06/20/21 120 lb 12.8 oz (54.8 kg)  03/12/21 123 lb (55.8 kg)  02/28/21 123 lb 3.2 oz (55.9 kg)    Physical Exam Constitutional:      General: She is not in acute distress.    Appearance: She is well-developed.  HENT:     Head: Normocephalic.     Right Ear: External ear normal.     Left Ear: External ear normal.     Nose: Nose normal.  Eyes:     General:        Right eye: No discharge.        Left eye: No discharge.     Conjunctiva/sclera: Conjunctivae  normal.     Pupils: Pupils are equal, round, and reactive to light.  Neck:     Thyroid: No thyromegaly.     Vascular: No JVD.     Trachea: No tracheal deviation.  Cardiovascular:     Rate and Rhythm: Normal rate and regular rhythm.     Heart sounds: Normal heart sounds.  Pulmonary:     Effort: No respiratory distress.     Breath sounds: No stridor. No wheezing.  Abdominal:     General: Bowel sounds are normal. There is no distension.     Palpations: Abdomen is soft. There is no mass.     Tenderness: There is no abdominal tenderness. There is no guarding or rebound.  Musculoskeletal:        General: No tenderness.     Cervical back: Normal range of motion and neck supple. No rigidity.  Lymphadenopathy:     Cervical: No cervical adenopathy.  Skin:    Findings: No erythema or rash.  Neurological:     Cranial Nerves: No cranial nerve deficit.     Motor: No abnormal muscle tone.     Coordination: Coordination normal.     Deep Tendon Reflexes: Reflexes normal.  Psychiatric:        Behavior: Behavior normal.        Thought Content: Thought content normal.        Judgment: Judgment normal.    Lab Results  Component Value Date   WBC 7.0 02/15/2021   HGB 13.2 02/15/2021   HCT 40.2 02/15/2021   PLT 177.0 02/15/2021   GLUCOSE 94 02/15/2021   CHOL 116 02/15/2021   TRIG 91.0 02/15/2021   HDL 32.20 (L) 02/15/2021   LDLDIRECT 76.7 11/17/2008   LDLCALC 65 02/15/2021   ALT 9 02/15/2021   AST 15 02/15/2021   NA 141 02/15/2021   K 4.7 02/15/2021   CL 107 02/15/2021   CREATININE 1.15 02/15/2021   BUN 18 02/15/2021   CO2 26 02/15/2021   TSH 4.12 02/15/2021    No results found.  Assessment & Plan:   Problem List Items Addressed This Visit     History of COVID-19    S/p posp stay at [facility] for COVID and confusion. C/o fatigue, achy bones; getting better....      Relevant Orders   Comprehensive metabolic panel   CBC with Differential/Platelet   CK    Osteoarthritis    Worsening pain Hold atorvastatin x 1 week      Primary cancer of right upper lobe of lung ([place])    Smoking 1/2 ppd -  discussed Seeing Dr Sondra Come q 6 mo      Relevant Orders   Comprehensive metabolic panel   CBC with Differential/Platelet   CK   Tobacco use disorder    Smoking 1/2 ppd      Vitamin D deficiency    On Vit D         No orders of the defined types were placed in this encounter.     Follow-up: Return in about 3 months (around 09/18/2021) for a follow-up visit.  Walker Kehr, MD

## 2021-06-20 NOTE — Assessment & Plan Note (Addendum)
Smoking 1/2 ppd - discussed Seeing Dr Sondra Come q 6 mo

## 2021-06-20 NOTE — Assessment & Plan Note (Signed)
Smoking 1/2 ppd

## 2021-06-20 NOTE — Assessment & Plan Note (Signed)
Worsening pain Hold atorvastatin x 1 week

## 2021-06-20 NOTE — Assessment & Plan Note (Signed)
S/p posp stay at [facility] for COVID and confusion. C/o fatigue, achy bones; getting better.Marland KitchenMarland KitchenMarland Kitchen

## 2021-06-25 NOTE — Addendum Note (Signed)
Addended by: Earnstine Regal on: 06/25/2021 04:36 PM   Modules accepted: Orders

## 2021-08-06 ENCOUNTER — Ambulatory Visit (INDEPENDENT_AMBULATORY_CARE_PROVIDER_SITE_OTHER): Payer: Medicare Other

## 2021-08-06 DIAGNOSIS — Z0000 Encounter for general adult medical examination without abnormal findings: Secondary | ICD-10-CM | POA: Diagnosis not present

## 2021-08-06 NOTE — Progress Notes (Addendum)
Subjective:   Tammy Brooks is a 88 y.o. female who presents for Medicare Annual (Subsequent) preventive examination.   I connected with Tammy Brooks today by telephone and verified that I am speaking with the correct person using two identifiers. Location patient: home Location provider: work Persons participating in the virtual visit: patient, provider.   I discussed the limitations, risks, security and privacy concerns of performing an evaluation and management service by telephone and the availability of in person appointments. I also discussed with the patient that there may be a patient responsible charge related to this service. The patient expressed understanding and verbally consented to this telephonic visit.    Interactive audio and video telecommunications were attempted between this provider and patient, however failed, due to patient having technical difficulties OR patient did not have access to video capability.  We continued and completed visit with audio only.    Review of Systems     Cardiac Risk Factors include: advanced age (>47men, >21 women);hypertension     Objective:    Today's Vitals   There is no height or weight on file to calculate BMI.  Advanced Directives 08/06/2021 03/12/2021 08/31/2020 08/03/2020 06/29/2020 01/06/2019 08/31/2018  Does Patient Have a Medical Advance Directive? Yes Yes No Yes Yes Yes Yes  Type of Paramedic of [place];Living will Tammy Brooks;Living will - Living will;Healthcare Power of Brooks Living will Tammy Brooks  Does patient want to make changes to medical advance directive? - No - Patient declined - No - Patient declined - - -  Copy of Tammy Brooks in Chart? No - copy requested Yes - validated most recent copy scanned in chart (See row information) - No - copy requested - No - copy requested -  Would patient like  information on creating a medical advance directive? - - No - Patient declined - - - No - Patient declined    Current Medications (verified) Outpatient Encounter Medications as of 08/06/2021  Medication Sig   acetaminophen (TYLENOL) 325 MG tablet Take 325 mg by mouth every 6 (six) hours as needed (pain.).   allopurinol (ZYLOPRIM) 100 MG tablet Take 0.5 tablets (50 mg total) by mouth daily.   amLODipine (NORVASC) 5 MG tablet Take 1 tablet (5 mg total) by mouth daily.   atorvastatin (LIPITOR) 40 MG tablet Take 2 tablets (80 mg total) by mouth daily.   Cholecalciferol (VITAMIN D3) 50 MCG (2000 UT) TABS Take 2,000 Units by mouth daily.   clopidogrel (PLAVIX) 75 MG tablet Take 1 tablet (75 mg total) by mouth daily.   furosemide (LASIX) 20 MG tablet TAKE 1-2 TABLETS BY MOUTH  DAILY AS NEEDED FOR EDEMA.   isosorbide mononitrate (IMDUR) 30 MG 24 hr tablet Take 1 tablet (30 mg total) by mouth daily.   levothyroxine (SYNTHROID) 50 MCG tablet Take 1 tablet (50 mcg total) by mouth daily.   losartan (COZAAR) 25 MG tablet Take 1 tablet (25 mg total) by mouth daily.   metoprolol tartrate (LOPRESSOR) 25 MG tablet TAKE ONE-HALF TABLET BY  MOUTH TWICE DAILY   Multiple Vitamins-Minerals (PRESERVISION/LUTEIN) CAPS Take 1 capsule by mouth 3 (three) times a week. In the morning   Naproxen Sodium (ALEVE PO) Take 220 mg by mouth 2 (two) times daily as needed.   nitroGLYCERIN (NITROSTAT) 0.4 MG SL tablet Place 1 tablet (0.4 mg total) under the tongue every 5 (five) minutes x 3 doses as needed  for chest pain.   tiZANidine (ZANAFLEX) 4 MG tablet Take 1 tablet (4 mg total) by mouth every 8 (eight) hours as needed for muscle spasms.   traMADol (ULTRAM) 50 MG tablet Take 0.5-1 tablets (25-50 mg total) by mouth every 8 (eight) hours as needed.   No facility-administered encounter medications on file as of 08/06/2021.    Allergies (verified) Nabumetone and Tape   History: Past Medical History:  Diagnosis Date    Abdominal aortic aneurysm    followed by Dr. Sherren Mocha Early   Arthritis    hands   Blind left eye    CAD (coronary artery disease) 08/1995   50-55% second diag dz.; occluded OM   Cataract    Complication of anesthesia    "Blood pressure went down" with retinal surgery - no problems with previous surgeries   Echocardiogram abnormal 03/2012   mild to mod AS, valve area1.58 cm2, mild concentric LVH, grade I diastolic dusfunction   Gout    Heart murmur    History of radiation therapy 07/17/2020-07/27/2020   SBRT to right and left lung    Dr Gery Pray   Hyperlipidemia    Hypertension    Hypothyroidism    MI (myocardial infarction) ([place]) 01/25/2018   Myocardial infarction [facility])    mild/ age 28   nscl ca 07/2020   Osteoporosis    PAD (peripheral artery disease) ([place])    mild to mod Rt ICA stenosis followed by doppler-02/05/12   Pelvic mass    Stroke ([place]) 2001   no residual problems   Thyroid disease    Tobacco abuse    Ulcer    Past Surgical History:  Procedure Laterality Date   ABDOMINAL HYSTERECTOMY     APPENDECTOMY     BRONCHIAL BIOPSY  06/13/2020   Procedure: BRONCHIAL BIOPSIES;  Surgeon: Garner Nash, DO;  Location: [place] [facility];  Service: Pulmonary;;   BRONCHIAL BRUSHINGS  06/13/2020   Procedure: BRONCHIAL BRUSHINGS;  Surgeon: Garner Nash, DO;  Location: [place];  Service: Pulmonary;;   BRONCHIAL NEEDLE ASPIRATION BIOPSY  06/13/2020   Procedure: BRONCHIAL NEEDLE ASPIRATION BIOPSIES;  Surgeon: Garner Nash, DO;  Location: [place] [facility];  Service: Pulmonary;;   BRONCHIAL WASHINGS  06/13/2020   Procedure: BRONCHIAL WASHINGS;  Surgeon: Garner Nash, DO;  Location: [place] [facility];  Service: Pulmonary;;   CARDIAC CATHETERIZATION  08/1995   50-55% second diag lesion, occluded OM   CARDIAC CATHETERIZATION  01/25/2018   results located in CE   CATARACT EXTRACTION W/ INTRAOCULAR LENS  IMPLANT, BILATERAL     COLONOSCOPY     EYE SURGERY Left    lense implant removed/  blind in left eye   FIDUCIAL MARKER PLACEMENT  06/13/2020   Procedure: FIDUCIAL MARKER PLACEMENT;  Surgeon: Garner Nash, DO;  Location: [place] [facility];  Service: Pulmonary;;   LAPAROTOMY N/A 04/26/2014   Procedure: EXPLORATORY LAPAROTOMY/RESECTION OF PELVIC MASS;  Surgeon: Alvino Chapel, MD;  Location: [facility];  Service: Gynecology;  Laterality: N/A;   OOPHORECTOMY     and fallopian tube   POLYPECTOMY     RETINAL DETACHMENT SURGERY  2006   TONSILLECTOMY AND ADENOIDECTOMY     TUBAL LIGATION     UPPER GASTROINTESTINAL ENDOSCOPY     treated for h-pylori   VIDEO BRONCHOSCOPY WITH ENDOBRONCHIAL NAVIGATION N/A 06/13/2020   Procedure: VIDEO BRONCHOSCOPY WITH ENDOBRONCHIAL NAVIGATION;  Surgeon: Garner Nash, DO;  Location: [place];  Service: Pulmonary;  Laterality: N/A;   VIDEO BRONCHOSCOPY WITH  ENDOBRONCHIAL ULTRASOUND  06/13/2020   Procedure: VIDEO BRONCHOSCOPY WITH ENDOBRONCHIAL ULTRASOUND;  Surgeon: Garner Nash, DO;  Location: [facility];  Service: Pulmonary;;   Family History  Problem Relation Age of Onset   Aneurysm Mother        AAA   Hypertension Mother    Hyperlipidemia Mother    Heart disease Father        CAD, MI x 3   Heart attack Father    Cancer Maternal Aunt        breast cancer   Diabetes Sister    Hyperlipidemia Sister    Hypertension Sister    Colon cancer Neg Hx    Stomach cancer Neg Hx    Social History   Socioeconomic History   Marital status: Widowed    Spouse name: Not on file   Number of children: 5   Years of education: 9   Highest education level: 9th grade  Occupational History   Occupation: retired    Comment: retired  Tobacco Use   Smoking status: Every Day    Packs/day: 0.50    Years: 68.00    Pack years: 34.00    Types: Cigarettes   Smokeless tobacco: Never  Vaping Use   Vaping Use: Never used  Substance and Sexual Activity   Alcohol use: Not Currently    Alcohol/week: 0.0 standard drinks    Comment:  occasionally - few drinks/month   Drug use: Never   Sexual activity: Not Currently    Partners: Male    Birth control/protection: Surgical    Comment: Hysterectomy  Other Topics Concern   Not on file  Social History Narrative   9th grade. married 2052-08-20- widowed Dec 10th'08. 4 sons - '55, '58, '60, '69 (two of these have passes away); 1 daughter - 08-20-2061 (deceased); 12 grandchildren; 91 great-grandchildren. 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 Other children are close and attentive. ACP/End-of-life: No CPR, no mechanical ventilation, no futile/heroic measures.   Social Determinants of Health   Financial Resource Strain: Low Risk    Difficulty of Paying Living Expenses: Not hard at all  Food Insecurity: No Food Insecurity   Worried About Charity fundraiser in the Last Year: Never true   [place] in the Last Year: Never true  Transportation Needs: No Transportation Needs   Lack of Transportation (Medical): No   Lack of Transportation (Non-Medical): No  Physical Activity: Inactive   Days of Exercise per Week: 0 days   Minutes of Exercise per Session: 0 min  Stress: No Stress Concern Present   Feeling of Stress : Only a little  Social Connections: Socially Isolated   Frequency of Communication with Friends and Family: Twice a week   Frequency of Social Gatherings with Friends and Family: Twice a week   Attends Religious Services: Never   Marine scientist or Organizations: No   Attends Archivist Meetings: Never   Marital Status: Widowed    Tobacco Counseling Ready to quit: Not Answered Counseling given: Not Answered   Clinical Intake:  Pre-visit preparation completed: Yes  Pain : No/denies pain     Nutritional Risks: None Diabetes: No  How often do you need to have someone help you when you read instructions, pamphlets, or other written materials from your doctor or pharmacy?: 2 - Rarely What is the last  grade level you completed in school?: 8 th grade  Diabetic?no  Interpreter Needed?: No  Information entered by :: [place] of Daily Living In your present state of health, do you have any difficulty performing the following activities: 08/06/2021  Hearing? N  Vision? N  Difficulty concentrating or making decisions? N  Walking or climbing stairs? N  Dressing or bathing? N  Doing errands, shopping? N  Preparing Food and eating ? N  Using the Toilet? N  In the past six months, have you accidently leaked urine? N  Do you have problems with loss of bowel control? N  Managing your Medications? N  Managing your Finances? N  Housekeeping or managing your Housekeeping? N  Some recent data might be hidden    Patient Care Team: Plotnikov, Evie Lacks, MD as PCP - General (Internal Medicine) Lorretta Harp, MD as PCP - Cardiology (Cardiology) Bond, Tracie Harrier, MD as Referring Physician (Ophthalmology) Gerarda Fraction, MD as Referring Physician (Ophthalmology) Marti Sleigh, MD as Attending Physician (Gynecology) Early, Arvilla Meres, MD as Consulting Physician (Vascular Surgery) Ralene Ok, MD as Consulting Physician (General Surgery) Ilean China (Inactive) as Physician Assistant (Cardiology) Drake Leach, F[place] as Consulting Physician (Optometry) Szabat, Darnelle Maffucci, [facility] as Pharmacist (Pharmacist) Gery Pray, MD as Consulting Physician (Radiation Oncology)  Indicate any recent Medical Services you may have received from other than [facility] providers in the past year (date may be approximate).     Assessment:   This is a routine wellness examination for Tammy Brooks.  Hearing/Vision screen No results found.  Dietary issues and exercise activities discussed: Current Exercise Habits: Home exercise routine, Type of exercise: walking, Time (Minutes): 10, Frequency (Times/Week): 2, Weekly Exercise (Minutes/Week): 20, Intensity: Mild, Exercise limited by:  respiratory conditions(s)   Goals Addressed             This Visit's Progress    Enjoy life, shop, travel, spend time with family and grandchildren   On track      Depression Screen PHQ 2/9 Scores 08/06/2021 08/06/2021 08/03/2020 07/24/2020 01/12/2020 01/06/2019 02/04/2017  PHQ - 2 Score 0 0 0 0 0 0 0  PHQ- 9 Score - - - - - - 0    Fall Risk Fall Risk  08/06/2021 08/03/2020 07/24/2020 01/12/2020 01/06/2019  Falls in the past year? 0 0 - 0 0  Number falls in past yr: 0 0 1 0 0  Injury with Fall? 0 0 1 0 0  Risk for fall due to : No Fall Risks No Fall Risks History of fall(s) - -  Follow up Falls evaluation completed Falls evaluation completed - - -    FALL RISK PREVENTION PERTAINING TO THE HOME:  Any stairs in or around the home? No  If so, are there any without handrails? No  Home free of loose throw rugs in walkways, pet beds, electrical cords, etc? Yes  Adequate lighting in your home to reduce risk of falls? Yes   ASSISTIVE DEVICES UTILIZED TO PREVENT FALLS:  Life alert? No  Use of a cane, walker or w/c? No  Grab bars in the bathroom? No  Shower chair or bench in shower? No  Elevated toilet seat or a handicapped toilet? No     Cognitive Function:Normal cognitive status assessed by direct observation by this Nurse Health Advisor. No abnormalities found.   MMSE - Mini Mental State Exam 02/04/2017  Orientation to time 5  Orientation to Place 5  Registration 3  Attention/ Calculation 4  Recall 1  Language- name 2 objects 2  Language-  repeat 1  Language- follow 3 step command 3  Language- read & follow direction 1  Write a sentence 1  Copy design 1  Total score 27     6CIT Screen 01/06/2019  What Year? 0 points  What month? 0 points  What time? 0 points  Count back from 20 0 points  Months in reverse 2 points  Repeat phrase 0 points  Total Score 2    Immunizations Immunization History  Administered Date(s) Administered   Fluad Quad(high Dose 65+) 02/23/2019, 02/15/2021    Influenza Split 03/18/2012   Influenza Whole 03/04/2009   Influenza, High Dose Seasonal PF 02/04/2017, 03/11/2018   Influenza,inj,Quad PF,6+ Mos 04/13/2014, 01/30/2016   Influenza-Unspecified 03/03/2013, 05/09/2015, 04/18/2020   Moderna Sars-Covid-2 Vaccination 08/07/2019, 09/04/2019, 04/18/2020   Pneumococcal Conjugate-13 08/30/2014   Pneumococcal Polysaccharide-23 11/17/2008, 08/21/2017   Td 11/17/2008   Tdap 12/30/2017   Zoster, Live 11/21/2009    TDAP status: Up to date  Flu Vaccine status: Up to date  Pneumococcal vaccine status: Up to date  Covid-19 vaccine status: Completed vaccines  Qualifies for Shingles Vaccine? Yes   Zostavax completed No   Shingrix Completed?: No.    Education has been provided regarding the importance of this vaccine. Patient has been advised to call insurance company to determine out of pocket expense if they have not yet received this vaccine. Advised may also receive vaccine at local pharmacy or Health Dept. Verbalized acceptance and understanding.  Screening Tests Health Maintenance  Topic Date Due   Zoster Vaccines- Shingrix (1 of 2) Never done   COVID-19 Vaccine (4 - Booster for Moderna series) 06/13/2020   TETANUS/TDAP  12/31/2027   Pneumonia Vaccine 73+ Years old  Completed   INFLUENZA VACCINE  Completed   DEXA SCAN  Completed   HPV VACCINES  Aged Out    Health Maintenance  Health Maintenance Due  Topic Date Due   Zoster Vaccines- Shingrix (1 of 2) Never done   COVID-19 Vaccine (4 - Booster for Moderna series) 06/13/2020    Colorectal cancer screening: No longer required.   Mammogram status: No longer required due to age.  Bone Density status: Completed 02/23/2019. Results reflect: Bone density results: OSTEOPENIA. Repeat every 2 years.  Lung Cancer Screening: (Low Dose CT Chest recommended if Age 38-80 years, 30 pack-year currently smoking OR have quit w/in 15years.) does not qualify.   Lung Cancer Screening Referral:  n/a  Additional Screening:  Hepatitis C Screening: does not qualify;   Vision Screening: Recommended annual ophthalmology exams for early detection of glaucoma and other disorders of the eye. Is the patient up to date with their annual eye exam?  Yes  Who is the provider or what is the name of the office in which the patient attends annual eye exams? Dr. Doyle Askew  If pt is not established with a provider, would they like to be referred to a provider to establish care? No .   Dental Screening: Recommended annual dental exams for proper oral hygiene  Community Resource Referral / Chronic Care Management: CRR required this visit?  No   CCM required this visit?  No      Plan:     I have personally reviewed and noted the following in the patients chart:   Medical and social history Use of alcohol, tobacco or illicit drugs  Current medications and supplements including opioid prescriptions.  Functional ability and status Nutritional status Physical activity Advanced directives List of other physicians Hospitalizations, surgeries, and ER visits  in previous 12 months Vitals Screenings to include cognitive, depression, and falls Referrals and appointments  In addition, I have reviewed and discussed with patient certain preventive protocols, quality metrics, and best practice recommendations. A written personalized care plan for preventive services as well as general preventive health recommendations were provided to patient.     Randel Pigg, LPN   0/12/5730   Nurse Notes: none   Medical screening examination/treatment/procedure(s) were performed by non-physician practitioner and as supervising physician I was immediately available for consultation/collaboration.  I agree with above. [name], MD

## 2021-08-06 NOTE — Patient Instructions (Signed)
Tammy Brooks , Thank you for taking time to come for your Medicare Wellness Visit. I appreciate your ongoing commitment to your health goals. Please review the following plan we discussed and let me know if I can assist you in the future.   Screening recommendations/referrals: Colonoscopy: No longer required  Mammogram: no longer required  Bone Density: 02/23/2019 Recommended yearly ophthalmology/optometry visit for glaucoma screening and checkup Recommended yearly dental visit for hygiene and checkup  Vaccinations: Influenza vaccine: completed  Pneumococcal vaccine: completed  Tdap vaccine: 12/30/2017 Shingles vaccine: will consider     Advanced directives: yes   Conditions/risks identified: none   Next appointment: none    Preventive Care 22 Years and Older, Female Preventive care refers to lifestyle choices and visits with your health care provider that can promote health and wellness. What does preventive care include? A yearly phy/sical exam. This is also called an annual well check. Dental exams once or twice a year. Routine eye exams. Ask your health care provider how often you should have your eyes checked. Personal lifestyle choices, including: Daily care of your teeth and gums. Regular physical activity. Eating a healthy diet. Avoiding tobacco and drug use. Limiting alcohol use. Practicing safe sex. Taking low-dose aspirin every day. Taking vitamin and mineral supplements as recommended by your health care provider. What happens during an annual well check? The services and screenings done by your health care provider during your annual well check will depend on your age, overall health, lifestyle risk factors, and family history of disease. Counseling  Your health care provider may ask you questions about your: Alcohol use. Tobacco use. Drug use. Emotional well-being. Home and relationship well-being. Sexual activity. Eating habits. History of falls. Memory  and ability to understand (cognition). Work and work Statistician. Reproductive health. Screening  You may have the following tests or measurements: Height, weight, and BMI. Blood pressure. Lipid and cholesterol levels. These may be checked every 5 years, or more frequently if you are over 23 years old. Skin check. Lung cancer screening. You may have this screening every year starting at age 65 if you have a 30-pack-year history of smoking and currently smoke or have quit within the past 15 years. Fecal occult blood test (FOBT) of the stool. You may have this test every year starting at age 105. Flexible sigmoidoscopy or colonoscopy. You may have a sigmoidoscopy every 5 years or a colonoscopy every 10 years starting at age 50. Hepatitis C blood test. Hepatitis B blood test. Sexually transmitted disease (STD) testing. Diabetes screening. This is done by checking your blood sugar (glucose) after you have not eaten for a while (fasting). You may have this done every 1-3 years. Bone density scan. This is done to screen for osteoporosis. You may have this done starting at age 70. Mammogram. This may be done every 1-2 years. Talk to your health care provider about how often you should have regular mammograms. Talk with your health care provider about your test results, treatment options, and if necessary, the need for more tests. Vaccines  Your health care provider may recommend certain vaccines, such as: Influenza vaccine. This is recommended every year. Tetanus, diphtheria, and acellular pertussis (Tdap, Td) vaccine. You may need a Td booster every 10 years. Zoster vaccine. You may need this after age 27. Pneumococcal 13-valent conjugate (PCV13) vaccine. One dose is recommended after age 45. Pneumococcal polysaccharide (PPSV23) vaccine. One dose is recommended after age 49. Talk to your health care provider about which screenings and  vaccines you need and how often you need them. This  information is not intended to replace advice given to you by your health care provider. Make sure you discuss any questions you have with your health care provider. Document Released: 06/16/2015 Document Revised: 02/07/2016 Document Reviewed: 03/21/2015 Elsevier Interactive Patient Education  2017 [employer] Prevention in the Home Falls can cause injuries. They can happen to people of all ages. There are many things you can do to make your home safe and to help prevent falls. What can I do on the outside of my home? Regularly fix the edges of walkways and driveways and fix any cracks. Remove anything that might make you trip as you walk through a door, such as a raised step or threshold. Trim any bushes or trees on the path to your home. Use bright outdoor lighting. Clear any walking paths of anything that might make someone trip, such as rocks or tools. Regularly check to see if handrails are loose or broken. Make sure that both sides of any steps have handrails. Any raised decks and porches should have guardrails on the edges. Have any leaves, snow, or ice cleared regularly. Use sand or salt on walking paths during winter. Clean up any spills in your garage right away. This includes oil or grease spills. What can I do in the bathroom? Use night lights. Install grab bars by the toilet and in the tub and shower. Do not use towel bars as grab bars. Use non-skid mats or decals in the tub or shower. If you need to sit down in the shower, use a plastic, non-slip stool. Keep the floor dry. Clean up any water that spills on the floor as soon as it happens. Remove soap buildup in the tub or shower regularly. Attach bath mats securely with double-sided non-slip rug tape. Do not have throw rugs and other things on the floor that can make you trip. What can I do in the bedroom? Use night lights. Make sure that you have a light by your bed that is easy to reach. Do not use any sheets or  blankets that are too big for your bed. They should not hang down onto the floor. Have a firm chair that has side arms. You can use this for support while you get dressed. Do not have throw rugs and other things on the floor that can make you trip. What can I do in the kitchen? Clean up any spills right away. Avoid walking on wet floors. Keep items that you use a lot in easy-to-reach places. If you need to reach something above you, use a strong step stool that has a grab bar. Keep electrical cords out of the way. Do not use floor polish or wax that makes floors slippery. If you must use wax, use non-skid floor wax. Do not have throw rugs and other things on the floor that can make you trip. What can I do with my stairs? Do not leave any items on the stairs. Make sure that there are handrails on both sides of the stairs and use them. Fix handrails that are broken or loose. Make sure that handrails are as long as the stairways. Check any carpeting to make sure that it is firmly attached to the stairs. Fix any carpet that is loose or worn. Avoid having throw rugs at the top or bottom of the stairs. If you do have throw rugs, attach them to the floor with carpet tape. Make  sure that you have a light switch at the top of the stairs and the bottom of the stairs. If you do not have them, ask someone to add them for you. What else can I do to help prevent falls? Wear shoes that: Do not have high heels. Have rubber bottoms. Are comfortable and fit you well. Are closed at the toe. Do not wear sandals. If you use a stepladder: Make sure that it is fully opened. Do not climb a closed stepladder. Make sure that both sides of the stepladder are locked into place. Ask someone to hold it for you, if possible. Clearly mark and make sure that you can see: Any grab bars or handrails. First and last steps. Where the edge of each step is. Use tools that help you move around (mobility aids) if they are  needed. These include: Canes. Walkers. Scooters. Crutches. Turn on the lights when you go into a dark area. Replace any light bulbs as soon as they burn out. Set up your furniture so you have a clear path. Avoid moving your furniture around. If any of your floors are uneven, fix them. If there are any pets around you, be aware of where they are. Review your medicines with your doctor. Some medicines can make you feel dizzy. This can increase your chance of falling. Ask your doctor what other things that you can do to help prevent falls. This information is not intended to replace advice given to you by your health care provider. Make sure you discuss any questions you have with your health care provider. Document Released: 03/16/2009 Document Revised: 10/26/2015 Document Reviewed: 06/24/2014 Elsevier Interactive Patient Education  2017 [employer].

## 2021-08-14 ENCOUNTER — Other Ambulatory Visit (INDEPENDENT_AMBULATORY_CARE_PROVIDER_SITE_OTHER): Payer: Medicare Other

## 2021-08-14 DIAGNOSIS — D508 Other iron deficiency anemias: Secondary | ICD-10-CM

## 2021-08-14 LAB — CBC WITH DIFFERENTIAL/PLATELET
Basophils Absolute: 0.1 10*3/uL (ref 0.0–0.1)
Basophils Relative: 0.8 % (ref 0.0–3.0)
Eosinophils Absolute: 0.1 10*3/uL (ref 0.0–0.7)
Eosinophils Relative: 1.8 % (ref 0.0–5.0)
HCT: 36.2 % (ref 36.0–46.0)
Hemoglobin: 11.9 g/dL — ABNORMAL LOW (ref 12.0–15.0)
Lymphocytes Relative: 24.8 % (ref 12.0–46.0)
Lymphs Abs: 1.9 10*3/uL (ref 0.7–4.0)
MCHC: 32.9 g/dL (ref 30.0–36.0)
MCV: 83.8 fl (ref 78.0–100.0)
Monocytes Absolute: 0.6 10*3/uL (ref 0.1–1.0)
Monocytes Relative: 8 % (ref 3.0–12.0)
Neutro Abs: 4.8 10*3/uL (ref 1.4–7.7)
Neutrophils Relative %: 64.6 % (ref 43.0–77.0)
Platelets: 213 10*3/uL (ref 150.0–400.0)
RBC: 4.32 Mil/uL (ref 3.87–5.11)
RDW: 14.5 % (ref 11.5–15.5)
WBC: 7.5 10*3/uL (ref 4.0–10.5)

## 2021-08-14 LAB — COMPREHENSIVE METABOLIC PANEL
ALT: 12 U/L (ref 0–35)
AST: 17 U/L (ref 0–37)
Albumin: 4 g/dL (ref 3.5–5.2)
Alkaline Phosphatase: 60 U/L (ref 39–117)
BUN: 19 mg/dL (ref 6–23)
CO2: 28 mEq/L (ref 19–32)
Calcium: 9.7 mg/dL (ref 8.4–10.5)
Chloride: 105 mEq/L (ref 96–112)
Creatinine, Ser: 1.18 mg/dL (ref 0.40–1.20)
GFR: 42.01 mL/min — ABNORMAL LOW (ref >60.00)
Glucose, Bld: 89 mg/dL (ref 70–99)
Potassium: 4.1 mEq/L (ref 3.5–5.1)
Sodium: 139 mEq/L (ref 135–145)
Total Bilirubin: 0.4 mg/dL (ref 0.2–1.2)
Total Protein: 6.3 g/dL (ref 6.0–8.3)

## 2021-08-16 ENCOUNTER — Ambulatory Visit (INDEPENDENT_AMBULATORY_CARE_PROVIDER_SITE_OTHER): Payer: Medicare Other | Admitting: Internal Medicine

## 2021-08-16 ENCOUNTER — Encounter: Payer: Self-pay | Admitting: Internal Medicine

## 2021-08-16 ENCOUNTER — Other Ambulatory Visit: Payer: Self-pay

## 2021-08-16 VITALS — BP 122/70 | HR 69 | Temp 98.3°F | Ht 61.0 in | Wt 116.0 lb

## 2021-08-16 DIAGNOSIS — M2550 Pain in unspecified joint: Secondary | ICD-10-CM | POA: Diagnosis not present

## 2021-08-16 DIAGNOSIS — N182 Chronic kidney disease, stage 2 (mild): Secondary | ICD-10-CM | POA: Diagnosis not present

## 2021-08-16 DIAGNOSIS — E785 Hyperlipidemia, unspecified: Secondary | ICD-10-CM

## 2021-08-16 DIAGNOSIS — R739 Hyperglycemia, unspecified: Secondary | ICD-10-CM

## 2021-08-16 DIAGNOSIS — N2889 Other specified disorders of kidney and ureter: Secondary | ICD-10-CM

## 2021-08-16 DIAGNOSIS — C3411 Malignant neoplasm of upper lobe, right bronchus or lung: Secondary | ICD-10-CM

## 2021-08-16 DIAGNOSIS — F17200 Nicotine dependence, unspecified, uncomplicated: Secondary | ICD-10-CM

## 2021-08-16 DIAGNOSIS — Z0000 Encounter for general adult medical examination without abnormal findings: Secondary | ICD-10-CM

## 2021-08-16 NOTE — Assessment & Plan Note (Signed)
1/2 PPD smoker. Discussed ?

## 2021-08-16 NOTE — Assessment & Plan Note (Signed)
Arthralgias - resolved off Lipitor. Back on lipitor - doing ok. OK to take qod ?

## 2021-08-16 NOTE — Progress Notes (Signed)
? ?Subjective:  ?Patient ID: Tammy Brooks, female    DOB: 1933/11/21  Age: 88 y.o. MRN: [id number] ? ?CC: 6 month f/u (She would like to discuss her lab results, she didn't understand them. ) ? ? ?HPI ?Tammy Brooks presents for COVID in Dec 2022 - was hospitalized x 3 days at [facility] ?F/u COPD, HTN, CRI ?F/u arthralgias - resolved off Lipitor. Back on lipitor - doing ok ? ?Outpatient Medications Prior to Visit  ?Medication Sig Dispense Refill  ? acetaminophen (TYLENOL) 325 MG tablet Take 325 mg by mouth every 6 (six) hours as needed (pain.).    ? allopurinol (ZYLOPRIM) 100 MG tablet Take 0.5 tablets (50 mg total) by mouth daily. 45 tablet 3  ? amLODipine (NORVASC) 5 MG tablet Take 1 tablet (5 mg total) by mouth daily. 90 tablet 3  ? atorvastatin (LIPITOR) 40 MG tablet Take 2 tablets (80 mg total) by mouth daily. 180 tablet 3  ? Cholecalciferol (VITAMIN D3) 50 MCG (2000 UT) TABS Take 2,000 Units by mouth daily.    ? clopidogrel (PLAVIX) 75 MG tablet Take 1 tablet (75 mg total) by mouth daily. 90 tablet 3  ? furosemide (LASIX) 20 MG tablet TAKE 1-2 TABLETS BY MOUTH  DAILY AS NEEDED FOR EDEMA. 180 tablet 1  ? isosorbide mononitrate (IMDUR) 30 MG 24 hr tablet Take 1 tablet (30 mg total) by mouth daily. 90 tablet 3  ? levothyroxine (SYNTHROID) 50 MCG tablet Take 1 tablet (50 mcg total) by mouth daily. 90 tablet 3  ? losartan (COZAAR) 25 MG tablet Take 1 tablet (25 mg total) by mouth daily. 90 tablet 3  ? metoprolol tartrate (LOPRESSOR) 25 MG tablet TAKE ONE-HALF TABLET BY  MOUTH TWICE DAILY 90 tablet 3  ? Multiple Vitamins-Minerals (PRESERVISION/LUTEIN) CAPS Take 1 capsule by mouth 3 (three) times a week. In the morning    ? Naproxen Sodium (ALEVE PO) Take 220 mg by mouth 2 (two) times daily as needed.    ? nitroGLYCERIN (NITROSTAT) 0.4 MG SL tablet Place 1 tablet (0.4 mg total) under the tongue every 5 (five) minutes x 3 doses as needed for chest pain. 20 tablet 3  ? tiZANidine (ZANAFLEX) 4 MG tablet Take 1 tablet (4 mg  total) by mouth every 8 (eight) hours as needed for muscle spasms. 90 tablet 2  ? traMADol (ULTRAM) 50 MG tablet Take 0.5-1 tablets (25-50 mg total) by mouth every 8 (eight) hours as needed. 100 tablet 2  ? ?No facility-administered medications prior to visit.  ? ? ?ROS: ?Review of Systems  ?Constitutional:  Positive for fatigue. Negative for activity change, appetite change, chills and unexpected weight change.  ?HENT:  Negative for congestion, mouth sores and sinus pressure.   ?Eyes:  Negative for visual disturbance.  ?Respiratory:  Negative for cough, chest tightness and wheezing.   ?Cardiovascular:  Negative for chest pain.  ?Gastrointestinal:  Negative for abdominal pain and nausea.  ?Genitourinary:  Negative for difficulty urinating, frequency and vaginal pain.  ?Musculoskeletal:  Negative for back pain and gait problem.  ?Skin:  Negative for pallor and rash.  ?Neurological:  Negative for dizziness, tremors, weakness, numbness and headaches.  ?Psychiatric/Behavioral:  Negative for confusion and sleep disturbance.   ? ?Objective:  ?BP 122/70   Pulse 69   Temp 98.3 ?F (36.8 ?C) (Oral)   Ht 5\' 1"  (1.549 m)   Wt 116 lb (52.6 kg)   LMP  (LMP Unknown)   SpO2 98%   BMI 21.92 kg/m?  ? ?  BP Readings from Last 3 Encounters:  ?08/16/21 122/70  ?06/20/21 118/66  ?03/12/21 131/63  ? ? ?Wt Readings from Last 3 Encounters:  ?08/16/21 116 lb (52.6 kg)  ?06/20/21 120 lb 12.8 oz (54.8 kg)  ?03/12/21 123 lb (55.8 kg)  ? ? ?Physical Exam ?Constitutional:   ?   General: She is not in acute distress. ?   Appearance: Normal appearance. She is well-developed.  ?HENT:  ?   Head: Normocephalic.  ?   Right Ear: External ear normal.  ?   Left Ear: External ear normal.  ?   Nose: Nose normal.  ?Eyes:  ?   General:     ?   Right eye: No discharge.     ?   Left eye: No discharge.  ?   Conjunctiva/sclera: Conjunctivae normal.  ?   Pupils: Pupils are equal, round, and reactive to light.  ?Neck:  ?   Thyroid: No thyromegaly.  ?    Vascular: No JVD.  ?   Trachea: No tracheal deviation.  ?Cardiovascular:  ?   Rate and Rhythm: Normal rate and regular rhythm.  ?   Heart sounds: Normal heart sounds.  ?Pulmonary:  ?   Effort: No respiratory distress.  ?   Breath sounds: No stridor. No wheezing.  ?Abdominal:  ?   General: Bowel sounds are normal. There is no distension.  ?   Palpations: Abdomen is soft. There is no mass.  ?   Tenderness: There is no abdominal tenderness. There is no guarding or rebound.  ?Musculoskeletal:     ?   General: No tenderness.  ?   Cervical back: Normal range of motion and neck supple. No rigidity.  ?Lymphadenopathy:  ?   Cervical: No cervical adenopathy.  ?Skin: ?   Findings: No erythema or rash.  ?Neurological:  ?   Cranial Nerves: No cranial nerve deficit.  ?   Motor: No abnormal muscle tone.  ?   Coordination: Coordination normal.  ?   Deep Tendon Reflexes: Reflexes normal.  ?Psychiatric:     ?   Behavior: Behavior normal.     ?   Thought Content: Thought content normal.     ?   Judgment: Judgment normal.  ? ? ?Lab Results  ?Component Value Date  ? WBC 7.5 08/14/2021  ? HGB 11.9 (L) 08/14/2021  ? HCT 36.2 08/14/2021  ? PLT 213.0 08/14/2021  ? GLUCOSE 89 08/14/2021  ? CHOL 116 02/15/2021  ? TRIG 91.0 02/15/2021  ? HDL 32.20 (L) 02/15/2021  ? LDLDIRECT 76.7 11/17/2008  ? [place] 65 02/15/2021  ? ALT 12 08/14/2021  ? AST 17 08/14/2021  ? NA 139 08/14/2021  ? K 4.1 08/14/2021  ? CL 105 08/14/2021  ? CREATININE 1.18 08/14/2021  ? BUN 19 08/14/2021  ? CO2 28 08/14/2021  ? TSH 4.12 02/15/2021  ? ? ?No results found. ? ?Assessment & Plan:  ? ?Problem List Items Addressed This Visit   ? ? Arthralgia  ?  Arthralgias - resolved off Lipitor. Back on lipitor - doing ok. OK to take qod ?  ?  ? CRI (chronic renal insufficiency), stage 2 (mild)  ?  GFR 40-50's ?Hydrate well ?Monitor GFR ?  ?  ? Dyslipidemia  ?  Arthralgias - resolved off Lipitor. Back on lipitor - doing ok. OK to take qod ?  ?  ? Primary cancer of right upper lobe of  lung ([place])  ?  1/2 PPD smoker. Discussed ?  ?  ?  Tobacco use disorder  ?  1/2 PPD smoker. Discussed ?  ?  ? Well adult exam - Primary  ? Relevant Orders  ? TSH  ? Urinalysis  ? CBC with Differential/Platelet  ? Lipid panel  ? Comprehensive metabolic panel  ? ?Other Visit Diagnoses   ? ? Hyperglycemia      ? Relevant Orders  ? Hemoglobin A1c  ? ?  ?  ? ? ?No orders of the defined types were placed in this encounter. ?  ? ? ?Follow-up: Return in about 6 months (around 02/16/2022) for Wellness Exam. ? ?Walker Kehr, MD ?

## 2021-08-16 NOTE — Assessment & Plan Note (Signed)
GFR 40-50's ?Hydrate well ?Monitor GFR ?

## 2021-08-28 ENCOUNTER — Other Ambulatory Visit: Payer: Self-pay

## 2021-08-28 ENCOUNTER — Encounter: Payer: Self-pay | Admitting: Nurse Practitioner

## 2021-08-28 ENCOUNTER — Ambulatory Visit: Payer: Medicare Other | Admitting: Nurse Practitioner

## 2021-08-28 ENCOUNTER — Telehealth: Payer: Self-pay | Admitting: *Deleted

## 2021-08-28 VITALS — BP 152/80 | HR 68 | Ht 61.0 in | Wt 117.0 lb

## 2021-08-28 DIAGNOSIS — I10 Essential (primary) hypertension: Secondary | ICD-10-CM

## 2021-08-28 DIAGNOSIS — F17200 Nicotine dependence, unspecified, uncomplicated: Secondary | ICD-10-CM

## 2021-08-28 DIAGNOSIS — C3411 Malignant neoplasm of upper lobe, right bronchus or lung: Secondary | ICD-10-CM | POA: Diagnosis not present

## 2021-08-28 DIAGNOSIS — I350 Nonrheumatic aortic (valve) stenosis: Secondary | ICD-10-CM | POA: Diagnosis not present

## 2021-08-28 DIAGNOSIS — E785 Hyperlipidemia, unspecified: Secondary | ICD-10-CM

## 2021-08-28 DIAGNOSIS — I714 Abdominal aortic aneurysm, without rupture, unspecified: Secondary | ICD-10-CM

## 2021-08-28 DIAGNOSIS — I6523 Occlusion and stenosis of bilateral carotid arteries: Secondary | ICD-10-CM | POA: Diagnosis not present

## 2021-08-28 DIAGNOSIS — I2510 Atherosclerotic heart disease of native coronary artery without angina pectoris: Secondary | ICD-10-CM

## 2021-08-28 NOTE — Progress Notes (Signed)
? ? ?Office Visit  ?  ?Patient Name: Tammy Brooks ?Date of Encounter: 08/28/2021 ? ?Primary Care Provider:  Cassandria Anger, MD ?Primary Cardiologist:  Quay Burow, MD ? ?Chief Complaint  ?  ?88 year old female with a history of CAD, severe aortic stenosis, carotid artery stenosis, hypertension, hyperlipidemia, abdominal aortic/iliac aneurysm, tobacco use, lung nodule, hypothyroidism, and osteoporosis who presents for follow-up related to CAD and aortic stenosis. ? ?Past Medical History  ?  ?Past Medical History:  ?Diagnosis Date  ? Abdominal aortic aneurysm   ? followed by Dr. Sherren Mocha Early  ? Arthritis   ? hands  ? Blind left eye   ? CAD (coronary artery disease) 08/1995  ? 50-55% second diag dz.; occluded OM  ? Cataract   ? Complication of anesthesia   ? "Blood pressure went down" with retinal surgery - no problems with previous surgeries  ? Echocardiogram abnormal 03/2012  ? mild to mod AS, valve area1.58 cm2, mild concentric LVH, grade I diastolic dusfunction  ? Gout   ? Heart murmur   ? History of radiation therapy 07/17/2020-07/27/2020  ? SBRT to right and left lung    Dr Gery Pray  ? Hyperlipidemia   ? Hypertension   ? Hypothyroidism   ? MI (myocardial infarction) ([place]) 01/25/2018  ? Myocardial infarction [facility])   ? mild/ age 75  ? nscl ca 07/2020  ? Osteoporosis   ? PAD (peripheral artery disease) ([place])   ? mild to mod Rt ICA stenosis followed by doppler-02/05/12  ? Pelvic mass   ? Stroke [facility]) 2001  ? no residual problems  ? Thyroid disease   ? Tobacco abuse   ? Ulcer   ? ?Past Surgical History:  ?Procedure Laterality Date  ? ABDOMINAL HYSTERECTOMY    ? APPENDECTOMY    ? BRONCHIAL BIOPSY  06/13/2020  ? Procedure: BRONCHIAL BIOPSIES;  Surgeon: Garner Nash, DO;  Location: [place] [facility];  Service: Pulmonary;;  ? BRONCHIAL BRUSHINGS  06/13/2020  ? Procedure: BRONCHIAL BRUSHINGS;  Surgeon: Garner Nash, DO;  Location: [place];  Service: Pulmonary;;  ? BRONCHIAL NEEDLE ASPIRATION BIOPSY  06/13/2020   ? Procedure: BRONCHIAL NEEDLE ASPIRATION BIOPSIES;  Surgeon: Garner Nash, DO;  Location: [place];  Service: Pulmonary;;  ? BRONCHIAL WASHINGS  06/13/2020  ? Procedure: BRONCHIAL WASHINGS;  Surgeon: Garner Nash, DO;  Location: [facility];  Service: Pulmonary;;  ? CARDIAC CATHETERIZATION  08/1995  ? 50-55% second diag lesion, occluded OM  ? CARDIAC CATHETERIZATION  01/25/2018  ? results located in [place]  ? CATARACT EXTRACTION W/ INTRAOCULAR LENS  IMPLANT, BILATERAL    ? COLONOSCOPY    ? EYE SURGERY Left   ? lense implant removed/ blind in left eye  ? FIDUCIAL MARKER PLACEMENT  06/13/2020  ? Procedure: FIDUCIAL MARKER PLACEMENT;  Surgeon: Garner Nash, DO;  Location: [place] [facility];  Service: Pulmonary;;  ? LAPAROTOMY N/A 04/26/2014  ? Procedure: EXPLORATORY LAPAROTOMY/RESECTION OF PELVIC MASS;  Surgeon: Alvino Chapel, MD;  Location: [facility];  Service: Gynecology;  Laterality: N/A;  ? OOPHORECTOMY    ? and fallopian tube  ? POLYPECTOMY    ? RETINAL DETACHMENT SURGERY  2006  ? TONSILLECTOMY AND ADENOIDECTOMY    ? TUBAL LIGATION    ? UPPER GASTROINTESTINAL ENDOSCOPY    ? treated for h-pylori  ? VIDEO BRONCHOSCOPY WITH ENDOBRONCHIAL NAVIGATION N/A 06/13/2020  ? Procedure: VIDEO BRONCHOSCOPY WITH ENDOBRONCHIAL NAVIGATION;  Surgeon: Garner Nash, DO;  Location: [place];  Service: Pulmonary;  Laterality: N/A;  ? VIDEO BRONCHOSCOPY WITH ENDOBRONCHIAL ULTRASOUND  06/13/2020  ? Procedure: VIDEO BRONCHOSCOPY WITH ENDOBRONCHIAL ULTRASOUND;  Surgeon: Garner Nash, DO;  Location: [place];  Service: Pulmonary;;  ? ? ?Allergies ? ?Allergies  ?Allergen Reactions  ? Nabumetone Anaphylaxis, Swelling and Other (See Comments)  ?  Causes inflammation of airways  ? Tape Other (See Comments)  ?  SKIN IS VERY THIN AND TEARS EASILY- PLEASE USE AN ALTERNATIVE TO TAPE, IF POSSIBLE!!  ? ? ?History of Present Illness  ?  ?88 year old female with the above past medical history including CAD, severe aortic  stenosis, carotid artery stenosis, hypertension, hyperlipidemia,  abdominal aortic/iliac aneurysm, tobacco use, lung nodule, hypothyroidism, and osteoporosis. ? ?History of mild CAD by cath in 1997.  Lexiscan Myoview in 2015 was normal.  She underwent PCI-circumflex obtuse marginal branch at [facility] in August 2019. Carotid Dopplers in 2018 showed 1 to 39% B ICA stenosis.  Echocardiogram in August 2022 showed EF 50 to 55%, moderate asymmetric LVH, moderate LAE, severe aortic stenosis, mean gradient 39.5 mmHg.  Most recent echo in January 2023 showed severe aortic stenosis, mean gradient 47 mmHg.  She has been asymptomatic, therefore, this has been managed conservatively. Additionally, she has a history of abdominal aortic aneurysm, last measuring 6.3 cm on chest CT in October 2022, followed by vascular surgery.  She was last seen in the office on 02/28/2021 and was stable overall from a cardiac standpoint.  She denied symptoms concerning for angina, asymptomatic with severe aortic stenosis.  She was hospitalized in January 2023 with COVID. She was also diagnosed with lung cancer and is s/p radiation therapy.  Following with oncology. ? ?She presents today for follow-up. Since her last visit she has been stable from a cardiac standpoint.  She denies any symptoms concerning for angina.  She has had some right axillary discomfort in the past week, she thinks she pulled a muscle. She denies symptoms concerning for angina.  She continues to smoke. BP is somewhat elevated in office today, however, she reports controlled BP at home. Overall, she reports feeling well, denies any specific concerns or complaints today. ? ?Home Medications  ?  ?Current Outpatient Medications  ?Medication Sig Dispense Refill  ? acetaminophen (TYLENOL) 325 MG tablet Take 325 mg by mouth every 6 (six) hours as needed (pain.).    ? allopurinol (ZYLOPRIM) 100 MG tablet Take 0.5 tablets (50 mg total) by mouth daily. 45 tablet 3  ?  amLODipine (NORVASC) 5 MG tablet Take 1 tablet (5 mg total) by mouth daily. 90 tablet 3  ? atorvastatin (LIPITOR) 40 MG tablet Take 2 tablets (80 mg total) by mouth daily. 180 tablet 3  ? Cholecalciferol (VITAMIN D3) 50 MCG (2000 UT) TABS Take 2,000 Units by mouth daily.    ? clopidogrel (PLAVIX) 75 MG tablet Take 1 tablet (75 mg total) by mouth daily. 90 tablet 3  ? furosemide (LASIX) 20 MG tablet TAKE 1-2 TABLETS BY MOUTH  DAILY AS NEEDED FOR EDEMA. 180 tablet 1  ? isosorbide mononitrate (IMDUR) 30 MG 24 hr tablet Take 1 tablet (30 mg total) by mouth daily. 90 tablet 3  ? levothyroxine (SYNTHROID) 50 MCG tablet Take 1 tablet (50 mcg total) by mouth daily. 90 tablet 3  ? losartan (COZAAR) 25 MG tablet Take 1 tablet (25 mg total) by mouth daily. 90 tablet 3  ? metoprolol tartrate (LOPRESSOR) 25 MG tablet TAKE ONE-HALF TABLET BY  MOUTH TWICE DAILY 90 tablet 3  ?  Multiple Vitamins-Minerals (PRESERVISION/LUTEIN) CAPS Take 1 capsule by mouth 3 (three) times a week. In the morning    ? Naproxen Sodium (ALEVE PO) Take 220 mg by mouth 2 (two) times daily as needed.    ? nitroGLYCERIN (NITROSTAT) 0.4 MG SL tablet Place 1 tablet (0.4 mg total) under the tongue every 5 (five) minutes x 3 doses as needed for chest pain. 20 tablet 3  ? tiZANidine (ZANAFLEX) 4 MG tablet Take 1 tablet (4 mg total) by mouth every 8 (eight) hours as needed for muscle spasms. 90 tablet 2  ? traMADol (ULTRAM) 50 MG tablet Take 0.5-1 tablets (25-50 mg total) by mouth every 8 (eight) hours as needed. 100 tablet 2  ? ?No current facility-administered medications for this visit.  ?  ? ?Review of Systems  ?  ?She denies chest pain, palpitations, dyspnea, pnd, orthopnea, n, v, dizziness, syncope, edema, weight gain, or early satiety. All other systems reviewed and are otherwise negative except as noted above.  ? ?Physical Exam  ?  ?VS:  BP (!) 152/80   Pulse 68   Ht 5\' 1"  (1.549 m)   Wt 117 lb (53.1 kg)   LMP  (LMP Unknown)   SpO2 99%   BMI 22.11  kg/m?    ?GEN: Well nourished, well developed, in no acute distress. ?HEENT: normal. ?Neck: Supple, no JVD, carotid bruits, or masses. ?Cardiac: RRR, 3/6 murmur, no rubs, or gallops. No clubbing, cyanosis, edema.

## 2021-08-28 NOTE — Patient Instructions (Signed)
Medication Instructions:  ?Your physician recommends that you continue on your current medications as directed. Please refer to the Current Medication list given to you today.  ? ?*If you need a refill on your cardiac medications before your next appointment, please call your pharmacy* ? ? ?Lab Work: ?NONE ordered at this time of appointment  ? ?Testing/Procedures: ?NONE ordered at this time of appointment  ? ?Follow-Up: ?At [facility], you and your health needs are our priority.  As part of our continuing mission to provide you with exceptional heart care, we have created designated Provider Care Teams.  These Care Teams include your primary Cardiologist (physician) and Advanced Practice Providers (APPs -  Physician Assistants and Nurse Practitioners) who all work together to provide you with the care you need, when you need it. ? ?We recommend signing up for the patient portal called "MyChart".  Sign up information is provided on this After Visit Summary.  MyChart is used to connect with patients for Virtual Visits (Telemedicine).  Patients are able to view lab/test results, encounter notes, upcoming appointments, etc.  Non-urgent messages can be sent to your provider as well.   ?To learn more about what you can do with MyChart, go to [email or web address].   ? ?Your next appointment:   ?6 month(s) ? ?The format for your next appointment:   ?In Person ? ?Provider:   ?Quay Burow, MD   ? ? ?Other Instructions ?Monitor Home Blood Pressure daily. Report if blood pressure is consistently greater than 130/80 ? ?

## 2021-08-28 NOTE — Telephone Encounter (Signed)
Called patient to inform of CT for 09-10-21- arrival time- 2:30 pm @ [facility], no restrictions to test, patient to receive CT results from Dr. Sondra Come on 09-13-21 @ 11:45 am, spoke with patient and she is aware of these appts. ?

## 2021-09-10 ENCOUNTER — Ambulatory Visit (HOSPITAL_COMMUNITY)
Admission: RE | Admit: 2021-09-10 | Discharge: 2021-09-10 | Disposition: A | Discharge status: Home / Self Care | Payer: Medicare Other | Source: Ambulatory Visit | Attending: Radiation Oncology | Admitting: Radiation Oncology

## 2021-09-10 DIAGNOSIS — R911 Solitary pulmonary nodule: Secondary | ICD-10-CM | POA: Diagnosis not present

## 2021-09-10 DIAGNOSIS — J439 Emphysema, unspecified: Secondary | ICD-10-CM | POA: Diagnosis not present

## 2021-09-10 DIAGNOSIS — C3411 Malignant neoplasm of upper lobe, right bronchus or lung: Secondary | ICD-10-CM | POA: Diagnosis not present

## 2021-09-10 DIAGNOSIS — I3139 Other pericardial effusion (noninflammatory): Secondary | ICD-10-CM | POA: Diagnosis not present

## 2021-09-10 DIAGNOSIS — R918 Other nonspecific abnormal finding of lung field: Secondary | ICD-10-CM | POA: Diagnosis not present

## 2021-09-12 NOTE — Progress Notes (Signed)
?Radiation Oncology         [phone] ?________________________________ ? ?Name: Tammy Brooks MRN: [id number]  ?Date: 09/13/2021  DOB: 03/31/1934 ? ?Follow-Up Visit Note ? ?CC: Plotnikov, Evie Lacks, MD  Garner Nash, DO ? ?  ICD-10-CM   ?1. Primary cancer of left lower lobe of lung ([place])  C34.32 CT CHEST WO CONTRAST  ?  ?2. Primary cancer of right upper lobe of lung ([facility])  C34.11   ?  ?3. Non-small cell lung cancer, unspecified laterality ([facility])  C34.90   ?  ? ? 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 ?Interval Since Last Radiation: 1 year, 1 month, and 20 days  ? ?Radiation Treatment Dates: 07/17/2020 - 07/27/2020 ?  ?Site : Left lung ?Technique : SBRT/SRT-VMAT ?Total Dose (Gy) : 50 Gy in 5 fractions (10 Gy/fx) ?Dose per Fx (Gy) : 10 ?Total Fx : 5/5 ?  ?Site: Right lung ?Technique: SBRT ?Total Dose (Gy) : 54 GY in 3 Fx's ? ?Narrative:  The patient returns today for routine 6 month follow-up and to review recent imaging, she was last seen here for follow-up on 03/12/21. Her most recent chest CT on 09/10/21 showed: stable nodules of the right upper lobe and lingula with ?associated post treatment changes; a slight increase in size of the peripheral nodular opacity in the right upper lobe likely reflective of evolving radiation changes; and additional stable small bilateral pulmonary nodules. CT also showed an unchanged partially visualized abdominal aortic aneurysm, measuring up to 6.3 cm.      ? ?      Interval history is significant for the patient developing COVID earlier this year.  She did require admission to the hospital for 3 days down in [facility].  She continues to have fatigue related to this issue.  She has also had some pain in the right axillary area and left lateral rib cage area since she developed the COVID infection.  She denies any worsening of her breathing since this issue developed.  She denies any significant cough or  hemoptysis.                   ? ?Allergies:  is allergic to nabumetone and tape. ? ?Meds: ?Current Outpatient Medications  ?Medication Sig Dispense Refill  ? acetaminophen (TYLENOL) 325 MG tablet Take 325 mg by mouth every 6 (six) hours as needed (pain.).    ? allopurinol (ZYLOPRIM) 100 MG tablet Take 0.5 tablets (50 mg total) by mouth daily. 45 tablet 3  ? amLODipine (NORVASC) 5 MG tablet Take 1 tablet (5 mg total) by mouth daily. 90 tablet 3  ? atorvastatin (LIPITOR) 40 MG tablet Take 2 tablets (80 mg total) by mouth daily. 180 tablet 3  ? Cholecalciferol (VITAMIN D3) 50 MCG (2000 UT) TABS Take 2,000 Units by mouth daily.    ? clopidogrel (PLAVIX) 75 MG tablet Take 1 tablet (75 mg total) by mouth daily. 90 tablet 3  ? furosemide (LASIX) 20 MG tablet TAKE 1-2 TABLETS BY MOUTH  DAILY AS NEEDED FOR EDEMA. 180 tablet 1  ? isosorbide mononitrate (IMDUR) 30 MG 24 hr tablet Take 1 tablet (30 mg total) by mouth daily. 90 tablet 3  ? levothyroxine (SYNTHROID) 50 MCG tablet Take 1 tablet (50 mcg total) by mouth daily. 90 tablet 3  ? losartan (COZAAR) 25 MG tablet Take 1 tablet (25 mg total) by mouth daily. 90 tablet 3  ? metoprolol tartrate (  LOPRESSOR) 25 MG tablet TAKE ONE-HALF TABLET BY  MOUTH TWICE DAILY 90 tablet 3  ? Multiple Vitamins-Minerals (PRESERVISION/LUTEIN) CAPS Take 1 capsule by mouth 3 (three) times a week. In the morning    ? Naproxen Sodium (ALEVE PO) Take 220 mg by mouth 2 (two) times daily as needed.    ? tiZANidine (ZANAFLEX) 4 MG tablet Take 1 tablet (4 mg total) by mouth every 8 (eight) hours as needed for muscle spasms. 90 tablet 2  ? traMADol (ULTRAM) 50 MG tablet Take 0.5-1 tablets (25-50 mg total) by mouth every 8 (eight) hours as needed. 100 tablet 2  ? nitroGLYCERIN (NITROSTAT) 0.4 MG SL tablet Place 1 tablet (0.4 mg total) under the tongue every 5 (five) minutes x 3 doses as needed for chest pain. (Patient not taking: Reported on 09/13/2021) 20 tablet 3  ? ?No current facility-administered  medications for this encounter.  ? ? ?Physical Findings: ?The patient is in no acute distress. Patient is alert and oriented. ? height is 5\' 1"  (1.549 m) and weight is 116 lb 9.6 oz (52.9 kg). Her temperature is 97.9 ?F (36.6 ?C). Her blood pressure is 143/63 (abnormal) and her pulse is 65. Her respiration is 20 and oxygen saturation is 100%. .  No significant changes. Lungs are clear to auscultation bilaterally. Heart has regular rate and rhythm. No palpable cervical, supraclavicular, or axillary adenopathy. Abdomen soft, non-tender, normal bowel sounds.  Careful palpation along the right axillary area and left rib cage area reveals no suspicious findings. ? ? ?Lab Findings: ?Lab Results  ?Component Value Date  ? WBC 7.5 08/14/2021  ? HGB 11.9 (L) 08/14/2021  ? HCT 36.2 08/14/2021  ? MCV 83.8 08/14/2021  ? PLT 213.0 08/14/2021  ? ? ?Radiographic Findings: ?CT CHEST WO CONTRAST ? ?Result Date: 09/11/2021 ?CLINICAL DATA:  Non-small cell lung cancer follow-up; * Tracking Code: BO * EXAM: CT CHEST WITHOUT CONTRAST TECHNIQUE: Multidetector CT imaging of the chest was performed following the standard protocol without IV contrast. RADIATION DOSE REDUCTION: This exam was performed according to the departmental dose-optimization program which includes automated exposure control, adjustment of the mA and/or kV according to patient size and/or use of iterative reconstruction technique. COMPARISON:  Chest CT dated March 09, 2021 FINDINGS: Cardiovascular: Normal heart size. Unchanged small pericardial effusion. Left main and three-vessel coronary artery calcifications. Severe atherosclerotic disease of the thoracic aorta. Partially visualized upper abdominal aortic aneurysm measuring up to 6.3 cm, unchanged in size when compared to prior exam. Mediastinum/Nodes: Esophagus and thyroid are unremarkable. No pathologically enlarged lymph nodes seen in the chest. Lungs/Pleura: Central airways are patent.  Centrilobular emphysema.  Stable nodules of the right upper lobe and lingula with associated post treatment changes. Right upper lobe pulmonary nodule measures 11 x 10 mm on series 5, image 54 and left lower lobe nodule measures 11 x 10 mm on series 5, image 95. Stable small bilateral solid pulmonary nodules. Slightly increased size of peripheral nodular opacity of the right upper lobe measuring 8 mm on series 5, image 49, previously 6 mm. Largest is located in the left lower lobe and measures 5 mm on series 5, image 91. Upper Abdomen: Partially visualized abdominal aortic aneurysm as described above. No acute abnormality. Musculoskeletal: Subacute appearing fracture of the lateral left seventh rib. No chest wall mass or suspicious bone lesions identified. IMPRESSION: 1. Stable nodules of the right upper lobe and lingula with associated post treatment changes. 2. Slightly increased size of peripheral nodular opacity of the  right upper lobe, likely evolving post radiation change. Recommend attention on follow-up. 3. Additional small bilateral solid pulmonary nodules are stable. 4. Aortic Atherosclerosis (ICD10-I70.0) and Emphysema (ICD10-J43.9). 5. Partially visualized abdominal aortic aneurysm, measuring up to 6.3 cm, unchanged when compared with prior exam. Electronically Signed   By: Yetta Glassman M.D.   On: 09/11/2021 11:37   ? 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 ?No evidence of recurrence on clinical exam today.  Recent chest CT scan also very encouraging.  I am unable to explain the patient's right axillary pain and left lateral rib cage pain.  I presume this is related to her prior COVID infection and hopefully will resolve with time.  If this persist I recommended she follow-up sooner with her primary care physician. ? ?Plan: Routine follow-up in 6 months.  Prior to this follow-up appointment the patient will undergo a chest CT scan to follow-up on her SBRT for treatment  of synchronous bilateral early stage lung cancers. ? ? ?23 minutes of total time was spent for this patient encounter, including preparation, face-to-face counseling with the patient and coordination of care, physical

## 2021-09-13 ENCOUNTER — Other Ambulatory Visit: Payer: Self-pay

## 2021-09-13 ENCOUNTER — Ambulatory Visit
Admission: RE | Admit: 2021-09-13 | Discharge: 2021-09-13 | Disposition: A | Discharge status: Home / Self Care | Payer: Medicare Other | Source: Ambulatory Visit | Attending: Radiation Oncology | Admitting: Radiation Oncology

## 2021-09-13 ENCOUNTER — Encounter: Payer: Self-pay | Admitting: Radiation Oncology

## 2021-09-13 VITALS — BP 143/63 | HR 65 | Temp 97.9°F | Resp 20 | Ht 61.0 in | Wt 116.6 lb

## 2021-09-13 DIAGNOSIS — Z923 Personal history of irradiation: Secondary | ICD-10-CM | POA: Insufficient documentation

## 2021-09-13 DIAGNOSIS — Z85118 Personal history of other malignant neoplasm of bronchus and lung: Secondary | ICD-10-CM | POA: Insufficient documentation

## 2021-09-13 DIAGNOSIS — C3490 Malignant neoplasm of unspecified part of unspecified bronchus or lung: Secondary | ICD-10-CM

## 2021-09-13 DIAGNOSIS — J432 Centrilobular emphysema: Secondary | ICD-10-CM | POA: Insufficient documentation

## 2021-09-13 DIAGNOSIS — Z79899 Other long term (current) drug therapy: Secondary | ICD-10-CM | POA: Insufficient documentation

## 2021-09-13 DIAGNOSIS — M79621 Pain in right upper arm: Secondary | ICD-10-CM | POA: Insufficient documentation

## 2021-09-13 DIAGNOSIS — Z8616 Personal history of COVID-19: Secondary | ICD-10-CM | POA: Insufficient documentation

## 2021-09-13 DIAGNOSIS — C3432 Malignant neoplasm of lower lobe, left bronchus or lung: Secondary | ICD-10-CM | POA: Diagnosis not present

## 2021-09-13 DIAGNOSIS — C3411 Malignant neoplasm of upper lobe, right bronchus or lung: Secondary | ICD-10-CM | POA: Diagnosis not present

## 2021-09-13 DIAGNOSIS — F17210 Nicotine dependence, cigarettes, uncomplicated: Secondary | ICD-10-CM | POA: Diagnosis not present

## 2021-09-13 DIAGNOSIS — I714 Abdominal aortic aneurysm, without rupture, unspecified: Secondary | ICD-10-CM | POA: Diagnosis not present

## 2021-09-13 NOTE — Progress Notes (Signed)
Tammy Brooks is here today for follow up post radiation to the lung. ? ?Lung Side: bilateral lungs ? ?Completed radiation treatment on: 07/27/2020 ? ?Does the patient complain of any of the following: ?Pain:left ribs, right axilla ?Shortness of breath w/wo exertion: denies ?Cough: denies ?Hemoptysis: denies ?Pain with swallowing: denies ?Swallowing/choking concerns: denies ?Appetite: poor ?Energy Level: fair ?Post radiation skin Changes: denies ? ? ? ?Additional comments if applicable: nothing of note ? ?Vitals:  ? 09/13/21 1129  ?BP: (!) 143/63  ?Pulse: 65  ?Resp: 20  ?Temp: 97.9 ?F (36.6 ?C)  ?SpO2: 100%  ?Weight: 116 lb 9.6 oz (52.9 kg)  ?Height: 5\' 1"  (1.549 m)  ? ? ? ?

## 2021-10-10 ENCOUNTER — Telehealth: Payer: Self-pay

## 2021-10-10 NOTE — Progress Notes (Signed)
? ? ?Chronic Care Management ?Pharmacy Assistant  ? ?Name: Tammy Brooks  MRN: [id number] DOB: March 26, 1934 ? ? ?Reason for Encounter: Disease State-General ?  ? ?Recent office visits:  ?08/28/21 Lenna Sciara, NP (CAD) EKG ordered; Medication changes: none ? ?08/16/21 Plotnikov, Evie Lacks, MD-PCP (Well adult exam) Ordered: blood work; Medication changes: none ? ?06/20/21 Plotnikov, Evie Lacks, MD-PCP (Iron deficiency anemia) Blood work ordered; Medication changes: none ? ?02/15/21 Plotnikov, Evie Lacks, MD-PCP (Flu shot) Orders Performed :CBC, Lipid panel; Medication changes: Zoster Vaccine Adjuvanted [facility]) injection, allopurinol (ZYLOPRIM) 100 MG tablet ? ?Recent consult visits:  ?None since last coordination call on 02/14/21 ? ?Hospital visits:  ?None since last coordination call on 02/14/21 ? ?Medications: ?Outpatient Encounter Medications as of 10/10/2021  ?Medication Sig Note  ? acetaminophen (TYLENOL) 325 MG tablet Take 325 mg by mouth every 6 (six) hours as needed (pain.).   ? allopurinol (ZYLOPRIM) 100 MG tablet Take 0.5 tablets (50 mg total) by mouth daily.   ? amLODipine (NORVASC) 5 MG tablet Take 1 tablet (5 mg total) by mouth daily.   ? atorvastatin (LIPITOR) 40 MG tablet Take 2 tablets (80 mg total) by mouth daily.   ? Cholecalciferol (VITAMIN D3) 50 MCG (2000 UT) TABS Take 2,000 Units by mouth daily.   ? clopidogrel (PLAVIX) 75 MG tablet Take 1 tablet (75 mg total) by mouth daily.   ? furosemide (LASIX) 20 MG tablet TAKE 1-2 TABLETS BY MOUTH  DAILY AS NEEDED FOR EDEMA.   ? isosorbide mononitrate (IMDUR) 30 MG 24 hr tablet Take 1 tablet (30 mg total) by mouth daily.   ? levothyroxine (SYNTHROID) 50 MCG tablet Take 1 tablet (50 mcg total) by mouth daily.   ? losartan (COZAAR) 25 MG tablet Take 1 tablet (25 mg total) by mouth daily.   ? metoprolol tartrate (LOPRESSOR) 25 MG tablet TAKE ONE-HALF TABLET BY  MOUTH TWICE DAILY   ? Multiple Vitamins-Minerals (PRESERVISION/LUTEIN) CAPS Take 1 capsule by mouth 3  (three) times a week. In the morning   ? Naproxen Sodium (ALEVE PO) Take 220 mg by mouth 2 (two) times daily as needed.   ? nitroGLYCERIN (NITROSTAT) 0.4 MG SL tablet Place 1 tablet (0.4 mg total) under the tongue every 5 (five) minutes x 3 doses as needed for chest pain. (Patient not taking: Reported on 09/13/2021) 09/13/2021: ON HAND  ? tiZANidine (ZANAFLEX) 4 MG tablet Take 1 tablet (4 mg total) by mouth every 8 (eight) hours as needed for muscle spasms.   ? traMADol (ULTRAM) 50 MG tablet Take 0.5-1 tablets (25-50 mg total) by mouth every 8 (eight) hours as needed.   ? ?No facility-administered encounter medications on file as of 10/10/2021.  ? ?Contacted Tammy Brooks for General Review Call ? ? ?Chart Review: ? ?Have there been any documented new, changed, or discontinued medications since last visit? Yes (If yes, include name, dose, frequency, date)02/15/21, Zoster Vaccine Adjuvanted (SHINGRIX) injection, allopurinol (ZYLOPRIM) 100 MG tablet ?Has there been any documented recent hospitalizations or ED visits since last visit with Clinical Pharmacist? No ?Brief Summary (including medication and/or Diagnosis changes): ? ? ?Adherence Review: ? ?Does the Clinical Pharmacist Assistant have access to adherence rates? Yes ?Adherence rates for STAR metric medications (List medication(s)/day supply/ last 2 fill dates). ?Adherence rates for medications indicated for disease state being reviewed (List medication(s)/day supply/ last 2 fill dates). ?Does the patient have >5 day gap between last estimated fill dates for any of the above medications or other medication  gaps? No ?Reason for medication gaps. ? ? ?Disease State Questions: ? ?Able to connect with Patient? Yes ?Did patient have any problems with their health recently? No ?Note problems and Concerns: ?Have you had any admissions or emergency room visits or worsening of your condition(s) since last visit? No ?Details of ED visit, hospital visit and/or worsening  condition(s): ?Have you had any visits with new specialists or providers since your last visit? No ?Explain: ?Have you had any new health care problem(s) since your last visit? No ?New problem(s) reported: ?Have you run out of any of your medications since you last spoke with clinical pharmacist? No ?What caused you to run out of your medications? ?Are there any medications you are not taking as prescribed? No ?What kept you from taking your medications as prescribed? ?Are you having any issues or side effects with your medications? No ?Note of issues or side effects: ?Do you have any other health concerns or questions you want to discuss with your Clinical Pharmacist before your next visit? No ?Note additional concerns and questions from Patient. ?Are there any health concerns that you feel we can do a better job addressing? No ?Note Patient's response. ?Are you having any problems with any of the following since the last visit: (select all that apply) ? None ? Details: ?12. Any falls since last visit? No ? Details: ?13. Any increased or uncontrolled pain since last visit? No ? Details: ?14. Next visit Type: None ?     ?15. Additional Details? No ?   ?Care Gaps: ?Colonoscopy-NA ?Diabetic Foot Exam-NA ?Mammogram-NA ?Ophthalmology-NA ?Dexa Scan - NA ?Annual Well Visit - 08/16/21 ?Micro albumin-NA ?Hemoglobin A1c- NA ? ?Star Rating Drugs: ?Losartan (COZAAR) 25 MG tablet Last filled:08/01/21 90 DS ?Atorvastatin (LIPITOR) 40 MG tablet Last filled:08/01/21 90 DS ? ? ?Ethelene Hal ?Clinical Pharmacist Assistant ?[phone]  ?

## 2021-10-11 DIAGNOSIS — H353113 Nonexudative age-related macular degeneration, right eye, advanced atrophic without subfoveal involvement: Secondary | ICD-10-CM | POA: Diagnosis not present

## 2021-10-11 DIAGNOSIS — H338 Other retinal detachments: Secondary | ICD-10-CM | POA: Diagnosis not present

## 2021-10-11 DIAGNOSIS — Z961 Presence of intraocular lens: Secondary | ICD-10-CM | POA: Diagnosis not present

## 2021-10-16 ENCOUNTER — Telehealth: Payer: Self-pay

## 2021-10-16 NOTE — Telephone Encounter (Signed)
Pt wishes to return to being seen here instead of [facility], due to lack of transportation. MD is aware and ordered a CT prior to her f/u appt. Pt is aware; no questions/concerns at this time. ?

## 2021-10-19 ENCOUNTER — Other Ambulatory Visit: Payer: Self-pay

## 2021-10-19 DIAGNOSIS — I714 Abdominal aortic aneurysm, without rupture, unspecified: Secondary | ICD-10-CM

## 2021-11-15 ENCOUNTER — Ambulatory Visit
Admission: RE | Admit: 2021-11-15 | Discharge: 2021-11-15 | Disposition: A | Discharge status: Home / Self Care | Payer: Medicare Other | Source: Ambulatory Visit | Attending: Vascular Surgery | Admitting: Vascular Surgery

## 2021-11-15 DIAGNOSIS — I714 Abdominal aortic aneurysm, without rupture, unspecified: Secondary | ICD-10-CM | POA: Diagnosis not present

## 2021-11-15 DIAGNOSIS — K551 Chronic vascular disorders of intestine: Secondary | ICD-10-CM | POA: Diagnosis not present

## 2021-11-15 DIAGNOSIS — N281 Cyst of kidney, acquired: Secondary | ICD-10-CM | POA: Diagnosis not present

## 2021-11-15 DIAGNOSIS — I701 Atherosclerosis of renal artery: Secondary | ICD-10-CM | POA: Diagnosis not present

## 2021-11-15 MED ORDER — IOPAMIDOL (ISOVUE-370) INJECTION 76%
75.0000 mL | Freq: Once | INTRAVENOUS | Status: AC | PRN
  Administered 2021-11-15: 75 mL via INTRAVENOUS

## 2021-11-26 NOTE — Progress Notes (Deleted)
Vascular and Vein Specialist of [place]  Patient name: Tammy Brooks MRN: [id number] DOB: [birth year] Sex: female  REASON FOR VISIT: Follow-up abdominal aortic aneurysm  HPI: Previously: Tammy Brooks is a 88 y.o. female here today for follow-up.  She reports that she had a myocardial infarction since my last visit with her in August 2019.  Had stenting of her coronaries at [facility]  She has recovered from this.  She has no symptoms referable to her aneurysm.  No peripheral disease.  04/25/20: Patient returns to clinic for discussion of CT angiogram results. She continues to have no symptoms referable to her aneurysm. CT confirms interval growth of aneurysm to 59 mm in greatest orthogonal measurement.  I reviewed the CT scan in detail both personally and with the patient. She has a circumferentially calcified descending thoracic aorta, perivisceral aorta, infrarenal aorta, common iliac arteries bilaterally.  She has a hostile neck to endovascular repair.  Neck is both angulated and has displaced the origin of the renal arteries.  Access for endovascular repair would also be difficult given bilateral common femoral artery plaque and bilateral iliac artery stenosis.  Radiologist noted likely lung malignancy in the left lower lobe. I reviewed this with the patient as well. She is understandably shaken by this news.   11/27/21: The patient has recovered from lung cancer. Repeat CT angiogram reviewed in detail with the patient. New colon malignancy noted. I have offered the patient evaluation by the [facility] aortic center, but she prefers care locally. I explained to her that her malignancy will need to be treated surgically.   Past Medical History:  Diagnosis Date   Abdominal aortic aneurysm (HCC)    followed by Dr. Sherren Mocha Early   Arthritis    hands   Blind left eye    CAD (coronary artery disease) 08/1995   50-55% second diag dz.; occluded OM   Cataract     Complication of anesthesia    "Blood pressure went down" with retinal surgery - no problems with previous surgeries   Echocardiogram abnormal 03/2012   mild to mod AS, valve area1.58 cm2, mild concentric LVH, grade I diastolic dusfunction   Gout    Heart murmur    History of radiation therapy 07/17/2020-07/27/2020   SBRT to right and left lung    Dr Gery Pray   Hyperlipidemia    Hypertension    Hypothyroidism    MI (myocardial infarction) ([place]) 01/25/2018   Myocardial infarction [facility])    mild/ age 32   nscl ca 07/2020   Osteoporosis    PAD (peripheral artery disease) (HCC)    mild to mod Rt ICA stenosis followed by doppler-02/05/12   Pelvic mass    Stroke ([place]) 2001   no residual problems   Thyroid disease    Tobacco abuse    Ulcer     Family History  Problem Relation Age of Onset   Aneurysm Mother        AAA   Hypertension Mother    Hyperlipidemia Mother    Heart disease Father        CAD, MI x 3   Heart attack Father    Cancer Maternal Aunt        breast cancer   Diabetes Sister    Hyperlipidemia Sister    Hypertension Sister    Colon cancer Neg Hx    Stomach cancer Neg Hx     SOCIAL HISTORY: Social History   Tobacco Use  Smoking status: Every Day    Packs/day: 0.50    Years: 68.00    Total pack years: 34.00    Types: Cigarettes   Smokeless tobacco: Never  Substance Use Topics   Alcohol use: Not Currently    Alcohol/week: 0.0 standard drinks of alcohol    Comment: occasionally - few drinks/month    Allergies  Allergen Reactions   Nabumetone Anaphylaxis, Swelling and Other (See Comments)    Causes inflammation of airways   Tape Other (See Comments)    SKIN IS VERY THIN AND TEARS EASILY- PLEASE USE AN ALTERNATIVE TO TAPE, IF POSSIBLE!!    Current Outpatient Medications  Medication Sig Dispense Refill   acetaminophen (TYLENOL) 325 MG tablet Take 325 mg by mouth every 6 (six) hours as needed (pain.).     allopurinol (ZYLOPRIM) 100 MG tablet Take  0.5 tablets (50 mg total) by mouth daily. 45 tablet 3   amLODipine (NORVASC) 5 MG tablet Take 1 tablet (5 mg total) by mouth daily. 90 tablet 3   atorvastatin (LIPITOR) 40 MG tablet Take 2 tablets (80 mg total) by mouth daily. 180 tablet 3   Cholecalciferol (VITAMIN D3) 50 MCG (2000 UT) TABS Take 2,000 Units by mouth daily.     clopidogrel (PLAVIX) 75 MG tablet Take 1 tablet (75 mg total) by mouth daily. 90 tablet 3   furosemide (LASIX) 20 MG tablet TAKE 1-2 TABLETS BY MOUTH  DAILY AS NEEDED FOR EDEMA. 180 tablet 1   isosorbide mononitrate (IMDUR) 30 MG 24 hr tablet Take 1 tablet (30 mg total) by mouth daily. 90 tablet 3   levothyroxine (SYNTHROID) 50 MCG tablet Take 1 tablet (50 mcg total) by mouth daily. 90 tablet 3   losartan (COZAAR) 25 MG tablet Take 1 tablet (25 mg total) by mouth daily. 90 tablet 3   metoprolol tartrate (LOPRESSOR) 25 MG tablet TAKE ONE-HALF TABLET BY  MOUTH TWICE DAILY 90 tablet 3   Multiple Vitamins-Minerals (PRESERVISION/LUTEIN) CAPS Take 1 capsule by mouth 3 (three) times a week. In the morning     Naproxen Sodium (ALEVE PO) Take 220 mg by mouth 2 (two) times daily as needed.     nitroGLYCERIN (NITROSTAT) 0.4 MG SL tablet Place 1 tablet (0.4 mg total) under the tongue every 5 (five) minutes x 3 doses as needed for chest pain. (Patient not taking: Reported on 09/13/2021) 20 tablet 3   tiZANidine (ZANAFLEX) 4 MG tablet Take 1 tablet (4 mg total) by mouth every 8 (eight) hours as needed for muscle spasms. 90 tablet 2   traMADol (ULTRAM) 50 MG tablet Take 0.5-1 tablets (25-50 mg total) by mouth every 8 (eight) hours as needed. 100 tablet 2   No current facility-administered medications for this visit.    REVIEW OF SYSTEMS:  [X]  denotes positive finding, [ ]  denotes negative finding Cardiac  Comments:  Chest pain or chest pressure:    Shortness of breath upon exertion:    Short of breath when lying flat:    Irregular heart rhythm:        Vascular    Pain in calf,  thigh, or hip brought on by ambulation:    Pain in feet at night that wakes you up from your sleep:     Blood clot in your veins:    Leg swelling:           PHYSICAL EXAM: There were no vitals filed for this visit.   GENERAL: The patient is a well-nourished female, in no  acute distress. The vital signs are documented above. CARDIOVASCULAR: 2+ radial 2+ femoral and 2+ dorsalis pedis pulses bilaterally.  Abdomen nontender and I do not palpate an aneurysm PULMONARY: There is good air exchange  MUSCULOSKELETAL: There are no major deformities or cyanosis. NEUROLOGIC: No focal weakness or paresthesias are detected. SKIN: There are no ulcers or rashes noted. PSYCHIATRIC: The patient has a normal affect.  DATA:  CT angiogram 04/24/2020 personally reviewed She has a circumferentially calcified descending thoracic aorta, perivisceral aorta, infrarenal aorta, common iliac arteries bilaterally.  She has a hostile neck to endovascular repair.  Neck is both angulated and has displaced the origin of the renal arteries.  Access for endovascular repair would also be difficult given bilateral common femoral artery plaque and bilateral iliac artery stenosis.  Report copied below.  CLINICAL DATA:  Abdominal aortic aneurysm, surveillance   EXAM: CTA ABDOMEN AND PELVIS WITHOUT AND WITH CONTRAST   TECHNIQUE: Multidetector CT imaging of the abdomen and pelvis was performed using the standard protocol during bolus administration of intravenous contrast. Multiplanar reconstructed images and MIPs were obtained and reviewed to evaluate the vascular anatomy.   CONTRAST:  70mL ISOVUE-370 IOPAMIDOL (ISOVUE-370) INJECTION 76%   COMPARISON:  Prior CTA abdomen/pelvis 01/06/2018   FINDINGS: VASCULAR   Aorta: Enlarging fusiform aneurysm of the highly tortuous abdominal aorta. Measurements are obtained on the coronal and reformatted images with maximal dimensions of 5.7 by 5.9 cm compared to 5.1 by 5.0 cm  previously. Extensive wall adherent mural thrombus throughout the aneurysmal portion of the aorta. Extensive atherosclerotic calcifications are also again noted.   Celiac: Calcified atherosclerotic plaque at the origin results in mild stenosis.   SMA: Calcified atherosclerotic plaque at the origin results in mild stenosis.   Renals: Both renal arteries are patent without evidence of aneurysm, dissection, vasculitis, fibromuscular dysplasia or significant stenosis.   IMA: Occluded at the origin. The vessel opacifies distally from collateral flow.   Inflow: Calcified plaque results in moderate stenosis at the origin of the right common iliac artery. Mild aneurysmal dilation of the left common iliac artery measuring up to 1.8 cm. Both internal iliac arteries are patent. The external iliac arteries are diseased but without dissection, aneurysm or significant stenosis.   Proximal Outflow: Bilateral common femoral and visualized portions of the superficial and profunda femoral arteries are patent without evidence of aneurysm, dissection, vasculitis or significant stenosis.   Veins: No obvious venous abnormality within the limitations of this arterial phase study.   Review of the MIP images confirms the above findings.   NON-VASCULAR   Lower chest: Enlarging lobulated pulmonary nodule in the inferior aspect of the anterior left lower lobe now measures 1.8 x 1.0 cm compared to 0.5 mm previously. There is evidence of spiculation around the margins. Findings are highly concerning for primary bronchogenic carcinoma. The lungs are otherwise clear. The heart is within normal limits for size.   Hepatobiliary: No focal liver abnormality is seen. No gallstones, gallbladder wall thickening, or biliary dilatation.   Pancreas: Unremarkable. No pancreatic ductal dilatation or surrounding inflammatory changes.   Spleen: Normal in size without focal abnormality.   Adrenals/Urinary Tract:  Stable left adrenal adenoma. The right adrenal gland is normal. No hydronephrosis, nephrolithiasis or enhancing renal mass. Stable left lower pole renal cyst. The ureters and bladder are unremarkable.   Stomach/Bowel: Colonic diverticular disease without CT evidence of active inflammation. No focal bowel wall thickening or evidence of obstruction.   Lymphatic: No suspicious lymphadenopathy.   Reproductive: Status  post hysterectomy. No adnexal masses.   Other: Multifocal fat containing ventral abdominal wall hernias. No evidence of ascites.   Musculoskeletal: No acute or significant osseous findings. Multilevel degenerative disc disease. Grade 1 anterolisthesis of L4 on L5.   IMPRESSION: VASCULAR   1. Enlarging fusiform aneurysmal dilation of the infrarenal abdominal aorta now with maximal dimensions of 5.9 x 5.7 cm compared to 5.1 x 5.0 cm in August of 2019. Recommend referral to a vascular specialist. This recommendation follows ACR consensus guidelines: White Paper of the ACR Incidental Findings Committee II on Vascular Findings. J Am Coll Radiol 2013; 10:789-794. Aortic aneurysm NOS (ICD10-I71.9); Aortic Atherosclerosis (ICD10-I70.0). 2. Mild aneurysmal dilation of the left common iliac artery measuring up to 1.8 cm. 3. Extensive atherosclerotic calcifications as detailed above.   NON-VASCULAR   1. Enlarging left lower lobe lobular pulmonary nodule with peripheral spiculations highly concerning for primary bronchogenic carcinoma. Recommend PET-CT and referral to multidisciplinary thoracic oncology conference [facility]). 2. Additional ancillary findings as above without significant interval change.   These results will be called to the ordering clinician or representative by the Radiologist Assistant, and communication documented in the PACS or [employer].   Signed,   Criselda Peaches, MD, [place]   [facility] Radiology Specialists    [facility]     Electronically Signed   By: Jacqulynn Cadet M.D.   On: 04/24/2020 12:54    MEDICAL ISSUES: 88 y.o. female with enlarging pararenal aortic aneurysm which is now at the threshold for intervention (48mm). Given her coronary artery disease, advanced age, likely lung malignancy, and circumferential aortic calcification, I think an open reconstruction is not the best option for her.   On my review, the angulation of the neck appears too great to permit a fenestrated repair within a Zenith device's IFU.  Parallel stent grafting could be an option for her, but would be challenging given the location and angulation of the origins of the SMA / LRA / RRA.  I think it would be best if she saw Dr. Sammuel Hines to discuss advanced endovascular aortic reconstruction. Will refer her today.  New diagnosis of lung cancer in left lower lobe.  I think this currently presents the greatest risk to her life. I will refer her to oncology ASAP. She understands she may need to delay treatment of her AAA to treat her lung cancer.   Recommend best medical therapy including:  Complete cessation from all tobacco products. Blood glucose control with goal A1c < 7%. Blood pressure control with goal blood pressure < 140/90 mmHg. Lipid reduction therapy with goal LDL-C <100 mg/dL (<70 if symptomatic from PAD).  Aspirin 81mg  PO QD.  Atorvastatin 40-80mg  PO QD (or other "high intensity" statin therapy).  Yevonne Aline. Stanford Breed, MD [facility] Vein Specialists of [facility] Phone Number: [phone] 11/26/2021 5:11 PM

## 2021-11-27 ENCOUNTER — Ambulatory Visit: Payer: Medicare Other | Admitting: Vascular Surgery

## 2021-12-19 ENCOUNTER — Other Ambulatory Visit: Payer: Self-pay | Admitting: Internal Medicine

## 2021-12-20 ENCOUNTER — Other Ambulatory Visit: Payer: Self-pay | Admitting: Internal Medicine

## 2021-12-23 NOTE — Progress Notes (Unsigned)
Vascular and Vein Specialist of [place]  Patient name: Tammy Brooks MRN: [id number] DOB: 1934/03/11 Sex: female  REASON FOR VISIT: Follow-up abdominal aortic aneurysm  HPI: Previously: Tammy Brooks is a 88 y.o. female here today for follow-up.  She reports that she had a myocardial infarction since my last visit with her in August 2019.  Had stenting of her coronaries at [facility].  She has recovered from this.  She has no symptoms referable to her aneurysm.  No peripheral disease.  04/25/20: Patient returns to clinic for discussion of CT angiogram results. She continues to have no symptoms referable to her aneurysm. CT confirms interval growth of aneurysm to 59 mm in greatest orthogonal measurement.  I reviewed the CT scan in detail both personally and with the patient. She has a circumferentially calcified descending thoracic aorta, perivisceral aorta, infrarenal aorta, common iliac arteries bilaterally.  She has a hostile neck to endovascular repair.  Neck is both angulated and has displaced the origin of the renal arteries.  Access for endovascular repair would also be difficult given bilateral common femoral artery plaque and bilateral iliac artery stenosis.  Radiologist noted likely lung malignancy in the left lower lobe. I reviewed this with the patient as well. She is understandably shaken by this news.   11/27/21: The patient has recovered from lung cancer. Repeat CT angiogram reviewed in detail with the patient. New colon malignancy noted. I have offered the patient evaluation by the [facility] aortic center, but she prefers care locally. I explained to her that her malignancy will need to be treated surgically.   Past Medical History:  Diagnosis Date   Abdominal aortic aneurysm (HCC)    followed by Dr. Sherren Mocha Early   Arthritis    hands   Blind left eye    CAD (coronary artery disease) 08/1995   50-55% second diag dz.; occluded OM   Cataract     Complication of anesthesia    "Blood pressure went down" with retinal surgery - no problems with previous surgeries   Echocardiogram abnormal 03/2012   mild to mod AS, valve area1.58 cm2, mild concentric LVH, grade I diastolic dusfunction   Gout    Heart murmur    History of radiation therapy 07/17/2020-07/27/2020   SBRT to right and left lung    Dr Gery Pray   Hyperlipidemia    Hypertension    Hypothyroidism    MI (myocardial infarction) ([place]) 01/25/2018   Myocardial infarction [facility])    mild/ age 48   nscl ca 07/2020   Osteoporosis    PAD (peripheral artery disease) (HCC)    mild to mod Rt ICA stenosis followed by doppler-02/05/12   Pelvic mass    Stroke ([place]) 2001   no residual problems   Thyroid disease    Tobacco abuse    Ulcer     Family History  Problem Relation Age of Onset   Aneurysm Mother        AAA   Hypertension Mother    Hyperlipidemia Mother    Heart disease Father        CAD, MI x 3   Heart attack Father    Cancer Maternal Aunt        breast cancer   Diabetes Sister    Hyperlipidemia Sister    Hypertension Sister    Colon cancer Neg Hx    Stomach cancer Neg Hx     SOCIAL HISTORY: Social History   Tobacco Use  Smoking status: Every Day    Packs/day: 0.50    Years: 68.00    Total pack years: 34.00    Types: Cigarettes   Smokeless tobacco: Never  Substance Use Topics   Alcohol use: Not Currently    Alcohol/week: 0.0 standard drinks of alcohol    Comment: occasionally - few drinks/month    Allergies  Allergen Reactions   Nabumetone Anaphylaxis, Swelling and Other (See Comments)    Causes inflammation of airways   Tape Other (See Comments)    SKIN IS VERY THIN AND TEARS EASILY- PLEASE USE AN ALTERNATIVE TO TAPE, IF POSSIBLE!!    Current Outpatient Medications  Medication Sig Dispense Refill   acetaminophen (TYLENOL) 325 MG tablet Take 325 mg by mouth every 6 (six) hours as needed (pain.).     allopurinol (ZYLOPRIM) 100 MG tablet Take  0.5 tablets (50 mg total) by mouth daily. 45 tablet 3   amLODipine (NORVASC) 5 MG tablet TAKE 1 TABLET BY MOUTH  DAILY 90 tablet 2   atorvastatin (LIPITOR) 40 MG tablet TAKE 2 TABLETS BY MOUTH  DAILY 180 tablet 2   Cholecalciferol (VITAMIN D3) 50 MCG (2000 UT) TABS Take 2,000 Units by mouth daily.     clopidogrel (PLAVIX) 75 MG tablet TAKE 1 TABLET BY MOUTH  DAILY 90 tablet 2   furosemide (LASIX) 20 MG tablet TAKE 1-2 TABLETS BY MOUTH  DAILY AS NEEDED FOR EDEMA. 180 tablet 1   isosorbide mononitrate (IMDUR) 30 MG 24 hr tablet TAKE 1 TABLET BY MOUTH  DAILY 90 tablet 3   levothyroxine (SYNTHROID) 50 MCG tablet Take 1 tablet (50 mcg total) by mouth daily. 90 tablet 3   losartan (COZAAR) 25 MG tablet TAKE 1 TABLET BY MOUTH  DAILY 90 tablet 2   metoprolol tartrate (LOPRESSOR) 25 MG tablet TAKE ONE-HALF TABLET BY  MOUTH TWICE DAILY 90 tablet 3   Multiple Vitamins-Minerals (PRESERVISION/LUTEIN) CAPS Take 1 capsule by mouth 3 (three) times a week. In the morning     Naproxen Sodium (ALEVE PO) Take 220 mg by mouth 2 (two) times daily as needed.     nitroGLYCERIN (NITROSTAT) 0.4 MG SL tablet Place 1 tablet (0.4 mg total) under the tongue every 5 (five) minutes x 3 doses as needed for chest pain. (Patient not taking: Reported on 09/13/2021) 20 tablet 3   tiZANidine (ZANAFLEX) 4 MG tablet Take 1 tablet (4 mg total) by mouth every 8 (eight) hours as needed for muscle spasms. 90 tablet 2   traMADol (ULTRAM) 50 MG tablet Take 0.5-1 tablets (25-50 mg total) by mouth every 8 (eight) hours as needed. 100 tablet 2   No current facility-administered medications for this visit.    REVIEW OF SYSTEMS:  [X]  denotes positive finding, [ ]  denotes negative finding Cardiac  Comments:  Chest pain or chest pressure:    Shortness of breath upon exertion:    Short of breath when lying flat:    Irregular heart rhythm:        Vascular    Pain in calf, thigh, or hip brought on by ambulation:    Pain in feet at night that  wakes you up from your sleep:     Blood clot in your veins:    Leg swelling:           PHYSICAL EXAM: Vitals:   12/25/21 1012  BP: 131/75  Pulse: 77  Resp: 20  Temp: 97.7 F (36.5 C)  SpO2: 96%  Weight: 113 lb (51.3 kg)  Height: 5\' 1"  (1.549 m)     GENERAL: The patient is a well-nourished female, in no acute distress. The vital signs are documented above. CARDIOVASCULAR: 2+ radial 2+ femoral and 2+ dorsalis pedis pulses bilaterally.  Abdomen nontender and I do not palpate an aneurysm PULMONARY: There is good air exchange  MUSCULOSKELETAL: There are no major deformities or cyanosis. NEUROLOGIC: No focal weakness or paresthesias are detected. SKIN: There are no ulcers or rashes noted. PSYCHIATRIC: The patient has a normal affect.  DATA:  Most recent CT angiogram 11/15/21 personally reviewed in detail. Highly complex aneurysmal and occlusive disease throughout the imaged aorta. She has bulky, calcific plaque in the descending thoracic aorta She has a highly angulated aneurysm neck with angulated and tightly grouped origins of the SMA, LRA, RRA. The aneurysm measures 62 mm There is occlusive disease in her common iliac arteries, which would make delivering an endoprosthesis challenging. 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  MEDICAL ISSUES: 88 y.o. female with enlarging juxtarenal aortic aneurysm. She has undergone successful treatment of lung cancer. Unfortunately, she has CT evidence of regionally advanced colon malignancy on CT scan today.   She has highly challenging aneurysm anatomy.  She reports she cannot travel to [facility] for evaluation.  I cannot fix her aneurysm endovascularly.  Open repair would be highly morbid with a high risk of death.  I reviewed this with the patient.  She is understanding.  She is leaning towards nonoperative management.  I encouraged her to follow-up with her cardiologist to obtain  perioperative risk stratification for open aneurysm repair.  I also referred her to Dr. Nadeen Landau of [facility] surgery for discussion of her colon cancer.  I will see her again in 1 month to make a definitive plan regarding this aneurysm.Marland Kitchen  Yevonne Aline. Stanford Breed, MD Vascular [facility] Vein Specialists of [facility] Phone Number: [phone] 12/23/2021 5:21 PM

## 2021-12-25 ENCOUNTER — Ambulatory Visit: Payer: Medicare Other | Admitting: Vascular Surgery

## 2021-12-25 ENCOUNTER — Encounter: Payer: Self-pay | Admitting: Vascular Surgery

## 2021-12-25 ENCOUNTER — Telehealth: Payer: Self-pay | Admitting: Cardiovascular Disease

## 2021-12-25 VITALS — BP 131/75 | HR 77 | Temp 97.7°F | Resp 20 | Ht 61.0 in | Wt 113.0 lb

## 2021-12-25 DIAGNOSIS — I7142 Juxtarenal abdominal aortic aneurysm, without rupture: Secondary | ICD-10-CM

## 2021-12-25 NOTE — Telephone Encounter (Signed)
   Pre-operative Risk Assessment    Patient Name: Tammy Brooks  DOB: 17-Jul-1933 MRN: [id number]      Request for Surgical Clearance    Procedure:   Triple A Repair   Date of Surgery:  Clearance TBD                                 Surgeon:  Dr. Rock Nephew Surgeon's Group or Practice Name:  [facility]  Phone number:  [phone] Fax number:  [phone]   Type of Clearance Requested:   Not sure    Type of Anesthesia:   not sure    Additional requests/questions:  Ask to speak to Kia.   Dorthey Sawyer   12/25/2021, 1:17 PM

## 2021-12-26 ENCOUNTER — Encounter: Payer: Self-pay | Admitting: Cardiovascular Disease

## 2021-12-26 NOTE — Telephone Encounter (Signed)
Error

## 2021-12-26 NOTE — Telephone Encounter (Signed)
   Name: Tammy Brooks  DOB: [birth year]  MRN: [id number]  Primary Cardiologist: Quay Burow, MD  Chart reviewed as part of pre-operative protocol coverage. Because of Tammy Brooks's past medical history and time since last visit, she will require a follow-up in-office visit in order to better assess preoperative cardiovascular risk.  Pre-op covering staff: - Please schedule appointment and call patient to inform them. If patient already had an upcoming appointment within acceptable timeframe, please add "pre-op clearance" to the appointment notes so provider is aware. - Please contact requesting surgeon's office via preferred method (i.e, phone, fax) to inform them of need for appointment prior to surgery.   Appt is ideally on a day Dr. Gwenlyn Found is in the clinic.   [place], [place]  12/26/2021, 9:55 PM

## 2021-12-27 NOTE — Progress Notes (Signed)
Cardiology Clinic Note   Patient Name: Tammy Brooks Date of Encounter: 12/28/2021  Primary Care Provider:  Cassandria Anger, MD Primary Cardiologist:  Tammy Burow, MD  Patient Profile    88 year old female we are following with history of dyslipidemia, hypertension, AAA most recent measurement of 5.5 cm followed by Tammy Brooks, ongoing tobacco abuse, CAD status post PTCA in 1997 of the circumflex at [facility] in 2019 severe aortic valve stenosis with aortic valve area of 0.4 cm mean gradient of 40 mmHg (asymptomatic), hyperlipidemia, hypertension, thyroid disease, prior history of CVA not on specifically categorized.    She also has a history of lung cancer in 2022, and was found on PET scan on 06/12/2020 showing hypermetabolic spiculated 2.0 cm right upper lobe pulmonary nodule compatible with primary bronchogenic carcinoma.  She was also have found to have hypermetabolic 2.2 cm irregular anterior left lower lobe pulmonary nodule compatible with synchronous primary bronchogenic carcinoma.  Suspicious for pulmonary metastasis.  She underwent radiation therapy for this.  Past Medical History    Past Medical History:  Diagnosis Date   Abdominal aortic aneurysm ([place])    followed by Dr. Sherren Mocha Brooks   Arthritis    hands   Blind left eye    CAD (coronary artery disease) 08/1995   50-55% second diag dz.; occluded OM   Cataract    Complication of anesthesia    "Blood pressure went down" with retinal surgery - no problems with previous surgeries   Echocardiogram abnormal 03/2012   mild to mod AS, valve area1.58 cm2, mild concentric LVH, grade I diastolic dusfunction   Gout    Heart murmur    History of radiation therapy 07/17/2020-07/27/2020   SBRT to right and left lung    Dr Tammy Brooks   Hyperlipidemia    Hypertension    Hypothyroidism    MI (myocardial infarction) ([place]) 01/25/2018   Myocardial infarction [facility])    mild/ age 74   nscl ca 07/2020    Osteoporosis    PAD (peripheral artery disease) ([place])    mild to mod Rt ICA stenosis followed by doppler-02/05/12   Pelvic mass    Stroke ([place]) 2001   no residual problems   Thyroid disease    Tobacco abuse    Ulcer    Past Surgical History:  Procedure Laterality Date   ABDOMINAL HYSTERECTOMY     APPENDECTOMY     BRONCHIAL BIOPSY  06/13/2020   Procedure: BRONCHIAL BIOPSIES;  Surgeon: Tammy Nash, DO;  Location: [place] [facility];  Service: Pulmonary;;   BRONCHIAL BRUSHINGS  06/13/2020   Procedure: BRONCHIAL BRUSHINGS;  Surgeon: Tammy Nash, DO;  Location: [place] [facility];  Service: Pulmonary;;   BRONCHIAL NEEDLE ASPIRATION BIOPSY  06/13/2020   Procedure: BRONCHIAL NEEDLE ASPIRATION BIOPSIES;  Surgeon: Tammy Nash, DO;  Location: [place] [facility];  Service: Pulmonary;;   BRONCHIAL WASHINGS  06/13/2020   Procedure: BRONCHIAL WASHINGS;  Surgeon: Tammy Nash, DO;  Location: [place] [facility];  Service: Pulmonary;;   CARDIAC CATHETERIZATION  08/1995   50-55% second diag lesion, occluded OM   CARDIAC CATHETERIZATION  01/25/2018   results located in CE   CATARACT EXTRACTION W/ INTRAOCULAR LENS  IMPLANT, BILATERAL     COLONOSCOPY     EYE SURGERY Left    lense implant removed/ blind in left eye   FIDUCIAL MARKER PLACEMENT  06/13/2020   Procedure: FIDUCIAL MARKER PLACEMENT;  Surgeon: Tammy Nash, DO;  Location: [facility];  Service: Pulmonary;;  LAPAROTOMY N/A 04/26/2014   Procedure: EXPLORATORY LAPAROTOMY/RESECTION OF PELVIC MASS;  Surgeon: Tammy Chapel, MD;  Location: [facility];  Service: Gynecology;  Laterality: N/A;   OOPHORECTOMY     and fallopian tube   POLYPECTOMY     RETINAL DETACHMENT SURGERY  2006   TONSILLECTOMY AND ADENOIDECTOMY     TUBAL LIGATION     UPPER GASTROINTESTINAL ENDOSCOPY     treated for h-pylori   VIDEO BRONCHOSCOPY WITH ENDOBRONCHIAL NAVIGATION N/A 06/13/2020   Procedure: VIDEO BRONCHOSCOPY WITH ENDOBRONCHIAL NAVIGATION;  Surgeon: Tammy Nash,  DO;  Location: [place];  Service: Pulmonary;  Laterality: N/A;   VIDEO BRONCHOSCOPY WITH ENDOBRONCHIAL ULTRASOUND  06/13/2020   Procedure: VIDEO BRONCHOSCOPY WITH ENDOBRONCHIAL ULTRASOUND;  Surgeon: Tammy Nash, DO;  Location: [facility];  Service: Pulmonary;;    Allergies  Allergies  Allergen Reactions   Nabumetone Anaphylaxis, Swelling and Other (See Comments)    Causes inflammation of airways   Tape Other (See Comments)    SKIN IS VERY THIN AND TEARS EASILY- PLEASE USE AN ALTERNATIVE TO TAPE, IF POSSIBLE!!    History of Present Illness    Tammy Brooks is an 88 year old female with above-mentioned cardiac history who is here for preoperative evaluation for AAA repair.  By vein and vascular specialist on date to be determined.  She has been seen by Dr. Darnelle Brooks on 11/27/2021, at which time it was documented that she had recently recovered from lung cancer with this repeat CT angiogram revealing new colon malignancy noted.    Per Tammy Brooks note, he is unable to fix her aneurysm endovascularly and she requires open repair. He documented that it would be highly morbid and high risk of death.  At the time she was leaning toward nonoperative management.  However she is to have a preoperative risk stratification for open aneurysm repair.  She was also referred to Tammy Brooks of [facility] surgery for discussion of her colon cancer.  Tammy Brooks comes today with her son and states that she is not willing to undergo aortic aneurysm repair surgery.  She does not like the high risk and is chosen not to undergo the procedure.  She also does not wish to undergo any aortic valve repair procedures.  She is concerned about the abnormal CT scan of her colon with malignancy seen in the transverse colon.  She does plan to follow-up with Tammy Brooks at [facility] surgery.  Her main complaint is chronic back pain and fatigue.  She denies significant chest discomfort, shortness of  breath, dizziness, but has been noticing some weight loss and lack of appetite.  Unfortunately, she continues to smoke a minimum of a half a pack of cigarettes a day.  Home Medications    Current Outpatient Medications  Medication Sig Dispense Refill   acetaminophen (TYLENOL) 325 MG tablet Take 325 mg by mouth every 6 (six) hours as needed (pain.).     allopurinol (ZYLOPRIM) 100 MG tablet Take 0.5 tablets (50 mg total) by mouth daily. 45 tablet 3   Cholecalciferol (VITAMIN D3) 50 MCG (2000 UT) TABS Take 2,000 Units by mouth daily.     levothyroxine (SYNTHROID) 50 MCG tablet Take 1 tablet (50 mcg total) by mouth daily. 90 tablet 3   Multiple Vitamins-Minerals (PRESERVISION/LUTEIN) CAPS Take 1 capsule by mouth 3 (three) times a week. In the morning     Naproxen Sodium (ALEVE PO) Take 220 mg by mouth 2 (two) times daily as needed.  tiZANidine (ZANAFLEX) 4 MG tablet Take 1 tablet (4 mg total) by mouth every 8 (eight) hours as needed for muscle spasms. 90 tablet 2   traMADol (ULTRAM) 50 MG tablet Take 0.5-1 tablets (25-50 mg total) by mouth every 8 (eight) hours as needed. 100 tablet 2   amLODipine (NORVASC) 5 MG tablet Take 1 tablet (5 mg total) by mouth daily. 90 tablet 2   atorvastatin (LIPITOR) 40 MG tablet Take 2 tablets (80 mg total) by mouth daily. 180 tablet 2   clopidogrel (PLAVIX) 75 MG tablet Take 1 tablet (75 mg total) by mouth daily. 90 tablet 2   furosemide (LASIX) 20 MG tablet TAKE 1-2 TABLETS BY MOUTH  DAILY AS NEEDED FOR EDEMA. 180 tablet 1   isosorbide mononitrate (IMDUR) 30 MG 24 hr tablet Take 1 tablet (30 mg total) by mouth daily. 90 tablet 3   losartan (COZAAR) 25 MG tablet Take 1 tablet (25 mg total) by mouth daily. 90 tablet 2   metoprolol tartrate (LOPRESSOR) 25 MG tablet TAKE ONE-HALF TABLET BY  MOUTH TWICE DAILY 90 tablet 3   nitroGLYCERIN (NITROSTAT) 0.4 MG SL tablet Place 1 tablet (0.4 mg total) under the tongue every 5 (five) minutes x 3 doses as needed for chest  pain. 20 tablet 3   No current facility-administered medications for this visit.     Family History    Family History  Problem Relation Age of Onset   Aneurysm Mother        AAA   Hypertension Mother    Hyperlipidemia Mother    Heart disease Father        CAD, MI x 3   Heart attack Father    Cancer Maternal Aunt        breast cancer   Diabetes Sister    Hyperlipidemia Sister    Hypertension Sister    Colon cancer Neg Hx    Stomach cancer Neg Hx    She indicated that her mother is deceased. She indicated that her father is deceased. She indicated that her sister is deceased. She indicated that her brother is deceased. She indicated that her maternal aunt is deceased. She indicated that the status of her neg hx is unknown.  Social History    Social History   Socioeconomic History   Marital status: Widowed    Spouse name: Not on file   Number of children: 5   Years of education: 9   Highest education level: 9th grade  Occupational History   Occupation: retired    Comment: retired  Tobacco Use   Smoking status: Every Day    Packs/day: 0.50    Years: 68.00    Total pack years: 34.00    Types: Cigarettes   Smokeless tobacco: Never  Vaping Use   Vaping Use: Never used  Substance and Sexual Activity   Alcohol use: Not Currently    Alcohol/week: 0.0 standard drinks of alcohol    Comment: occasionally - few drinks/month   Drug use: Never   Sexual activity: Not Currently    Partners: Male    Birth control/protection: Surgical    Comment: Hysterectomy  Other Topics Concern   Not on file  Social History Narrative   9th grade. married 2052/09/02- widowed Dec 10th'08. 4 sons - '55, '58, '60, '69 (two of these have passes away); 1 daughter - 2061/09/02 (deceased); 12 grandchildren; 40 great-grandchildren. 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 Other children  are close and attentive. ACP/End-of-life: No CPR, no mechanical ventilation, no  futile/heroic measures.   Social Determinants of Health   Financial Resource Strain: Low Risk  (08/06/2021)   Overall Financial Resource Strain (CARDIA)    Difficulty of Paying Living Expenses: Not hard at all  Food Insecurity: No Food Insecurity (08/06/2021)   Hunger Vital Sign    Worried About Running Out of Food in the Last Year: Never true    Ran Out of Food in the Last Year: Never true  Transportation Needs: No Transportation Needs (08/06/2021)   PRAPARE - Hydrologist (Medical): No    Lack of Transportation (Non-Medical): No  Physical Activity: Inactive (08/06/2021)   Exercise Vital Sign    Days of Exercise per Week: 0 days    Minutes of Exercise per Session: 0 min  Stress: No Stress Concern Present (08/06/2021)   [place]    Feeling of Stress : Only a little  Social Connections: Socially Isolated (08/06/2021)   Social Connection and Isolation Panel [NHANES]    Frequency of Communication with Friends and Family: Twice a week    Frequency of Social Gatherings with Friends and Family: Twice a week    Attends Religious Services: Never    Marine scientist or Organizations: No    Attends Archivist Meetings: Never    Marital Status: Widowed  Intimate Partner Violence: Not At Risk (08/06/2021)   Humiliation, Afraid, Rape, and Kick questionnaire    Fear of Current or Ex-Partner: No    Emotionally Abused: No    Physically Abused: No    Sexually Abused: No     Review of Systems    General:  No chills, fever, night sweats or positive for unintentional weight loss Cardiovascular:  No chest pain, dyspnea on exertion, edema, orthopnea, palpitations, paroxysmal nocturnal dyspnea. Dermatological: No rash, lesions/masses Respiratory: No cough, dyspnea Urologic: No hematuria, dysuria Abdominal:   No nausea, vomiting, diarrhea, bright red blood per rectum, melena, or hematemesis.   Positive for decreased appetite.  Neurologic:  No visual changes, wkns, changes in mental status. All other systems reviewed and are otherwise negative except as noted above.   Physical Exam    VS:  BP 100/60 (BP Location: Left Arm, Patient Position: Sitting, Cuff Size: Normal)   Pulse 65   Resp 20   Ht 5\' 2"  (1.575 m)   Wt 112 lb (50.8 kg)   LMP  (LMP Unknown)   SpO2 95%   BMI 20.49 kg/m  , BMI Body mass index is 20.49 kg/m.     GEN: Well nourished, well developed, in no acute distress. HEENT: normal. Neck: Supple, no JVD, carotid bruits, or masses. Cardiac: RRR, harsh 3/6 systolic murmur with high-pitched systolic crescendo decrescendo heard best at the left sternal border, high-pitched systolic murmur heard best at the right sternal border,, radiation into the carotids, and into the abdomen.  No, rubs, or gallops. No clubbing, cyanosis, edema.  Radials/DP/PT 2+ and equal bilaterally.  Respiratory:  Respirations regular and unlabored, some inspiratory wheezes and occasional cough GI: Soft, nontender, nondistended, BS + x 4.  Ventral hernia and umbilical hernia are palpated with hyperactive bowel sounds MS: no deformity or atrophy. Skin: warm and dry, no rash. Neuro:  Strength and sensation are intact. Psych: Normal affect.  Accessory Clinical Findings    ECG personally reviewed by me today-sinus rhythm with occasional premature ventricular complexes, left axis deviation.  Anterior lateral Q waves noted.  Rate of 65 bpm- No acute changes  Lab Results  Component Value Date   WBC 7.5 08/14/2021   HGB 11.9 (L) 08/14/2021   HCT 36.2 08/14/2021   MCV 83.8 08/14/2021   PLT 213.0 08/14/2021   Lab Results  Component Value Date   CREATININE 1.18 08/14/2021   BUN 19 08/14/2021   NA 139 08/14/2021   K 4.1 08/14/2021   CL 105 08/14/2021   CO2 28 08/14/2021   Lab Results  Component Value Date   ALT 12 08/14/2021   AST 17 08/14/2021   ALKPHOS 60 08/14/2021   BILITOT 0.4  08/14/2021   Lab Results  Component Value Date   CHOL 116 02/15/2021   HDL 32.20 (L) 02/15/2021   LDLCALC 65 02/15/2021   LDLDIRECT 76.7 11/17/2008   TRIG 91.0 02/15/2021   CHOLHDL 4 02/15/2021    No results found for: "HGBA1C"  Review of Prior Studies:  Echocardiogram dated 06/04/2021 (transcribed from scanned report).  There is borderline concentric left ventricular hypertrophy Overall left ventricular systolic function low normal with an EF between 50 and 55%. The diastolic filling pattern indicates impaired relaxation.  No regional wall motion abnormalities were noted. There is severe aortic stenosis present            A V-max: 4.08 ms            AVA Vmax, PT: 0.63 cm            AVA Vmax: 0.58 cm            AVA (VTI): 0.57 cm             Ao diameter 2.4 cm 5.  Mild mitral regurgitation present 6.  Moderate tricuspid regurgitation present 7.  The right ventricular systolic pressure measured by Doppler is 56 mmHg.  Read by: Maude Leriche, MD.   CT of the abdomen and pelvis 11/15/2021 IMPRESSION: Adenocarcinoma of the transverse colon, with evidence of local peritoneal metastases. Follow-up with oncology is indicated.   Essentially unchanged size and configuration of juxtarenal abdominal aortic aneurysm, with the greatest estimated dimension 6.2 cm, unchanged from January 2023. Aortic aneurysm NOS (ICD10-I71.9).   High-grade stenosis of the right CIA origin is suspected given the degree of calcifications and late arrival of the contrast bolus. Associated diffuse atherosclerotic changes of the bilateral iliac system, with right CIA greatest estimated diameter being 15 mm, and estimated greatest diameter of the left CIA 19 mm.   Aortic atherosclerosis and associated mesenteric and renal artery disease. The degree of ostial stenosis of the bilateral renal arteries is estimated 50%. Aortic Atherosclerosis (ICD10-I70.0).   Unchanged pericardial effusion.  CT of the  Chest 09/10/2021 IMPRESSION: 1. Stable nodules of the right upper lobe and lingula with associated post treatment changes. 2. Slightly increased size of peripheral nodular opacity of the right upper lobe, likely evolving post radiation change. Recommend attention on follow-up. 3. Additional small bilateral solid pulmonary nodules are stable. 4. Aortic Atherosclerosis (ICD10-I70.0) and Emphysema (ICD10-J43.9). 5. Partially visualized abdominal aortic aneurysm, measuring up to 6.3 cm, unchanged when compared with prior exam.  Echocardiogram 01/10/2021 1. The aortic valve is abnormal. There is severe calcifcation of the  aortic valve. Aortic valve regurgitation is trivial. Severe aortic valve  stenosis. Aortic valve area, by VTI measures 0.54 cm. Aortic valve mean  gradient measures 39.5 mmHg. Aortic  valve Vmax measures 4.00 m/s, dimensionless index 0.19.   2. Left ventricular ejection fraction, by  estimation, is 50 to 55%. The  left ventricle has low normal function. The left ventricle demonstrates  regional wall motion abnormalities (see scoring diagram/findings for  description). There is moderate  asymmetric left ventricular hypertrophy of the septal segment. Left  ventricular diastolic parameters are indeterminate.   3. Right ventricular systolic function is normal. The right ventricular  size is normal. There is normal pulmonary artery systolic pressure.   4. Left atrial size was moderately dilated.   5. The mitral valve is normal in structure. Trivial mitral valve  regurgitation. At least mild mitral stenosis, DVI 0.45, valve area by  continuity 1.3 cm2. The mean mitral valve gradient is 2.9 mmHg with  average heart rate of 60 bpm. Severe mitral  annular calcification.   6. The inferior vena cava is normal in size with greater than 50%  respiratory variability, suggesting right atrial pressure of 3 mmHg.   AAA Ultrasound Duplex 04/24/2020 Abdominal Aorta: There is evidence of  abnormal dilatation of the mid and  distal Abdominal aorta. The largest aortic diameter has increased compared  to prior exam. Previous diameter measurement was 5.1 cm obtained on  08/11/19.   Assessment & Plan   1.  Cardiology preoperative evaluation: At this time the patient has decided against any surgical repair of her aortic aneurysm or aortic valve.  I have discussed this with Dr. Ellyn Hack, DOD onsite at [facility] office today.  Both he and I concur that this is a high risk surgery and unlikely to be beneficial to her, and likely result in her death.  I have reviewed her most recent echocardiogram from January 2023 which did show severe aortic valve stenosis.  She does not wish to pursue any surgical intervention.  I did recommend palliative care to her and her son.  Information has been provided.  She will read over the information and a referral has also been completed so that she may talk with palliative care providers to make further decisions about how she wishes to move forward.  She appreciated the information and does not feel like she needs to visit so many physicians when they are telling her she is not a surgical candidate.  2.  Abnormal CT of the abdomen: Probable transverse colon malignancy.  She is going to meet with Tammy Brooks, [facility] surgery for more information concerning how this can be managed.  She is not willing to undergo any surgical correction which would allow her to go under anesthesia as she is uncertain that she would survive.  I am deferring this conversation to her and Tammy Brooks.  3.  Severe aortic valve stenosis: Patient does not wish to pursue surgical intervention.  She does not wish any follow-up cardiology appointments.  Continue afterload reduction and blood pressure control.  No changes in her medications at this time.  4.  AAA: She does not wish to have any surgical repair as she knows this is high risk.  She does not plan to follow-up with surgical  team concerning pursuing repair.  Continue blood pressure control.  5.  Palliative care: I have discussed this with the patient and her son and she is very interested in learning more about palliative care.  A referral has been sent along with paperwork given to her for educational purposes.  She is encouraged to review and ask questions, and make her own decisions once she has all the information to do so.  A discussion with her family should also be included.  Current medicines are reviewed at length with the patient today.  I have spent 35 min's  dedicated to the care of this patient on the date of this encounter to include pre-visit review of records, assessment, management and diagnostic testing,with shared decision making. Signed, Phill Myron. West Pugh, ANP, AACC   12/28/2021 12:05 PM    [address] Office [phone] Fax [phone]  Notice: This dictation was prepared with Dragon dictation along with smaller phrase technology. Any transcriptional errors that result from this process are unintentional and may not be corrected upon review.

## 2021-12-27 NOTE — Telephone Encounter (Signed)
Pt has been scheduled to see Jory Sims, NP, 12/28/21, clearance will be addressed at that time.  Will route to the requesting surgeon's office to make them aware.

## 2021-12-28 ENCOUNTER — Encounter: Payer: Self-pay | Admitting: Adult Health

## 2021-12-28 ENCOUNTER — Ambulatory Visit (INDEPENDENT_AMBULATORY_CARE_PROVIDER_SITE_OTHER): Payer: Medicare Other | Admitting: Adult Health

## 2021-12-28 VITALS — BP 100/60 | HR 65 | Resp 20 | Ht 62.0 in | Wt 112.0 lb

## 2021-12-28 DIAGNOSIS — I714 Abdominal aortic aneurysm, without rupture, unspecified: Secondary | ICD-10-CM

## 2021-12-28 DIAGNOSIS — Z515 Encounter for palliative care: Secondary | ICD-10-CM

## 2021-12-28 DIAGNOSIS — F17200 Nicotine dependence, unspecified, uncomplicated: Secondary | ICD-10-CM | POA: Diagnosis not present

## 2021-12-28 DIAGNOSIS — I350 Nonrheumatic aortic (valve) stenosis: Secondary | ICD-10-CM

## 2021-12-28 DIAGNOSIS — Z9861 Coronary angioplasty status: Secondary | ICD-10-CM

## 2021-12-28 DIAGNOSIS — I2510 Atherosclerotic heart disease of native coronary artery without angina pectoris: Secondary | ICD-10-CM

## 2021-12-28 MED ORDER — ATORVASTATIN CALCIUM 40 MG PO TABS: 80.0000 mg | ORAL_TABLET | Freq: Every day | ORAL | 2 refills | Status: DC

## 2021-12-28 MED ORDER — ISOSORBIDE MONONITRATE ER 30 MG PO TB24: 30.0000 mg | ORAL_TABLET | Freq: Every day | ORAL | 3 refills | Status: DC

## 2021-12-28 MED ORDER — CLOPIDOGREL BISULFATE 75 MG PO TABS: 75.0000 mg | ORAL_TABLET | Freq: Every day | ORAL | 2 refills | Status: DC

## 2021-12-28 MED ORDER — METOPROLOL TARTRATE 25 MG PO TABS: ORAL_TABLET | ORAL | 3 refills | Status: DC

## 2021-12-28 MED ORDER — LOSARTAN POTASSIUM 25 MG PO TABS: 25.0000 mg | ORAL_TABLET | Freq: Every day | ORAL | 2 refills | Status: DC

## 2021-12-28 MED ORDER — FUROSEMIDE 20 MG PO TABS: ORAL_TABLET | ORAL | 1 refills | Status: DC

## 2021-12-28 MED ORDER — NITROGLYCERIN 0.4 MG SL SUBL: 0.4000 mg | SUBLINGUAL_TABLET | SUBLINGUAL | 3 refills | Status: DC | PRN

## 2021-12-28 MED ORDER — AMLODIPINE BESYLATE 5 MG PO TABS: 5.0000 mg | ORAL_TABLET | Freq: Every day | ORAL | 2 refills | Status: DC

## 2021-12-28 NOTE — Patient Instructions (Signed)
Medication Instructions:  No changes *If you need a refill on your cardiac medications before your next appointment, please call your pharmacy*   Lab Work: No labs If you have labs (blood work) drawn today and your tests are completely normal, you will receive your results only by: [place] (if you have MyChart) OR A paper copy in the mail If you have any lab test that is abnormal or we need to change your treatment, we will call you to review the results.   Testing/Procedures: No Testing   Follow-Up: At [facility], you and your health needs are our priority.  As part of our continuing mission to provide you with exceptional heart care, we have created designated Provider Care Teams.  These Care Teams include your primary Cardiologist (physician) and Advanced Practice Providers (APPs -  Physician Assistants and Nurse Practitioners) who all work together to provide you with the care you need, when you need it.  We recommend signing up for the patient portal called "MyChart".  Sign up information is provided on this After Visit Summary.  MyChart is used to connect with patients for Virtual Visits (Telemedicine).  Patients are able to view lab/test results, encounter notes, upcoming appointments, etc.  Non-urgent messages can be sent to your provider as well.   To learn more about what you can do with MyChart, go to [email or web address].    Your next appointment:   1 year  The format for your next appointment:   In Person  Provider:   Quay Burow, MD

## 2022-01-09 DIAGNOSIS — C184 Malignant neoplasm of transverse colon: Secondary | ICD-10-CM | POA: Diagnosis not present

## 2022-01-14 NOTE — Progress Notes (Unsigned)
[facility] [place]  Patient name: Tammy Brooks MRN: [id number] DOB: February 10, 1934 Sex: female  REASON FOR VISIT: Follow-up abdominal aortic aneurysm  HPI: Previously: Tammy Brooks is a 88 y.o. female here today for follow-up.  She reports that she had a myocardial infarction since my last visit with her in August 2019.  Had stenting of her coronaries at [facility].  She has recovered from this.  She has no symptoms referable to her aneurysm.  No peripheral disease.  04/25/20: Patient returns to clinic for discussion of CT angiogram results. She continues to have no symptoms referable to her aneurysm. CT confirms interval growth of aneurysm to 59 mm in greatest orthogonal measurement.  I reviewed the CT scan in detail both personally and with the patient. She has a circumferentially calcified descending thoracic aorta, perivisceral aorta, infrarenal aorta, common iliac arteries bilaterally.  She has a hostile neck to endovascular repair.  Neck is both angulated and has displaced the origin of the renal arteries.  Access for endovascular repair would also be difficult given bilateral common femoral artery plaque and bilateral iliac artery stenosis.  Radiologist noted likely lung malignancy in the left lower lobe. I reviewed this with the patient as well. She is understandably shaken by this news.   11/27/21: The patient has recovered from lung cancer. Repeat CT angiogram reviewed in detail with the patient. New colon malignancy noted. I have offered the patient evaluation by the [facility] aortic center, but she prefers care locally. I explained to her that her malignancy will need to be treated surgically.   Past Medical History:  Diagnosis Date   Abdominal aortic aneurysm (HCC)    followed by Dr. Sherren Mocha Early   Arthritis    hands   Blind left eye    CAD (coronary artery disease) 08/1995   50-55% second diag dz.; occluded OM   Cataract     Complication of anesthesia    "Blood pressure went down" with retinal surgery - no problems with previous surgeries   Echocardiogram abnormal 03/2012   mild to mod AS, valve area1.58 cm2, mild concentric LVH, grade I diastolic dusfunction   Gout    Heart murmur    History of radiation therapy 07/17/2020-07/27/2020   SBRT to right and left lung    Dr Gery Pray   Hyperlipidemia    Hypertension    Hypothyroidism    MI (myocardial infarction) ([place]) 01/25/2018   Myocardial infarction [facility])    mild/ age 74   nscl ca 07/2020   Osteoporosis    PAD (peripheral artery disease) (HCC)    mild to mod Rt ICA stenosis followed by doppler-02/05/12   Pelvic mass    Stroke ([place]) 2001   no residual problems   Thyroid disease    Tobacco abuse    Ulcer     Family History  Problem Relation Age of Onset   Aneurysm Mother        AAA   Hypertension Mother    Hyperlipidemia Mother    Heart disease Father        CAD, MI x 3   Heart attack Father    Cancer Maternal Aunt        breast cancer   Diabetes Sister    Hyperlipidemia Sister    Hypertension Sister    Colon cancer Neg Hx    Stomach cancer Neg Hx     SOCIAL HISTORY: Social History   Tobacco Use  Smoking status: Every Day    Packs/day: 0.50    Years: 68.00    Total pack years: 34.00    Types: Cigarettes   Smokeless tobacco: Never  Substance Use Topics   Alcohol use: Not Currently    Alcohol/week: 0.0 standard drinks of alcohol    Comment: occasionally - few drinks/month    Allergies  Allergen Reactions   Nabumetone Anaphylaxis, Swelling and Other (See Comments)    Causes inflammation of airways   Tape Other (See Comments)    SKIN IS VERY THIN AND TEARS EASILY- PLEASE USE AN ALTERNATIVE TO TAPE, IF POSSIBLE!!    Current Outpatient Medications  Medication Sig Dispense Refill   acetaminophen (TYLENOL) 325 MG tablet Take 325 mg by mouth every 6 (six) hours as needed (pain.).     allopurinol (ZYLOPRIM) 100 MG tablet Take  0.5 tablets (50 mg total) by mouth daily. 45 tablet 3   amLODipine (NORVASC) 5 MG tablet Take 1 tablet (5 mg total) by mouth daily. 90 tablet 2   atorvastatin (LIPITOR) 40 MG tablet Take 2 tablets (80 mg total) by mouth daily. 180 tablet 2   Cholecalciferol (VITAMIN D3) 50 MCG (2000 UT) TABS Take 2,000 Units by mouth daily.     clopidogrel (PLAVIX) 75 MG tablet Take 1 tablet (75 mg total) by mouth daily. 90 tablet 2   furosemide (LASIX) 20 MG tablet TAKE 1-2 TABLETS BY MOUTH  DAILY AS NEEDED FOR EDEMA. 180 tablet 1   isosorbide mononitrate (IMDUR) 30 MG 24 hr tablet Take 1 tablet (30 mg total) by mouth daily. 90 tablet 3   levothyroxine (SYNTHROID) 50 MCG tablet Take 1 tablet (50 mcg total) by mouth daily. 90 tablet 3   losartan (COZAAR) 25 MG tablet Take 1 tablet (25 mg total) by mouth daily. 90 tablet 2   metoprolol tartrate (LOPRESSOR) 25 MG tablet TAKE ONE-HALF TABLET BY  MOUTH TWICE DAILY 90 tablet 3   Multiple Vitamins-Minerals (PRESERVISION/LUTEIN) CAPS Take 1 capsule by mouth 3 (three) times a week. In the morning     Naproxen Sodium (ALEVE PO) Take 220 mg by mouth 2 (two) times daily as needed.     nitroGLYCERIN (NITROSTAT) 0.4 MG SL tablet Place 1 tablet (0.4 mg total) under the tongue every 5 (five) minutes x 3 doses as needed for chest pain. 20 tablet 3   tiZANidine (ZANAFLEX) 4 MG tablet Take 1 tablet (4 mg total) by mouth every 8 (eight) hours as needed for muscle spasms. 90 tablet 2   traMADol (ULTRAM) 50 MG tablet Take 0.5-1 tablets (25-50 mg total) by mouth every 8 (eight) hours as needed. 100 tablet 2   No current facility-administered medications for this visit.    REVIEW OF SYSTEMS:  [X]  denotes positive finding, [ ]  denotes negative finding Cardiac  Comments:  Chest pain or chest pressure:    Shortness of breath upon exertion:    Short of breath when lying flat:    Irregular heart rhythm:        Vascular    Pain in calf, thigh, or hip brought on by ambulation:     Pain in feet at night that wakes you up from your sleep:     Blood clot in your veins:    Leg swelling:           PHYSICAL EXAM: There were no vitals filed for this visit.    GENERAL: The patient is a well-nourished female, in no acute distress. The vital signs  are documented above. CARDIOVASCULAR: 2+ radial 2+ femoral and 2+ dorsalis pedis pulses bilaterally.  Abdomen nontender and I do not palpate an aneurysm PULMONARY: There is good air exchange  MUSCULOSKELETAL: There are no major deformities or cyanosis. NEUROLOGIC: No focal weakness or paresthesias are detected. SKIN: There are no ulcers or rashes noted. PSYCHIATRIC: The patient has a normal affect.  DATA:  Most recent CT angiogram 11/15/21 personally reviewed in detail. Highly complex aneurysmal and occlusive disease throughout the imaged aorta. She has bulky, calcific plaque in the descending thoracic aorta She has a highly angulated aneurysm neck with angulated and tightly grouped origins of the SMA, LRA, RRA. The aneurysm measures 62 mm There is occlusive disease in her common iliac arteries, which would make delivering an endoprosthesis challenging. 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  MEDICAL ISSUES: 88 y.o. female with enlarging juxtarenal aortic aneurysm. She has undergone successful treatment of lung cancer. Unfortunately, she has CT evidence of regionally advanced colon malignancy on CT scan today.   She has highly challenging aneurysm anatomy.  She reports she cannot travel to [facility] for evaluation.  I cannot fix her aneurysm endovascularly.  Open repair would be highly morbid with a high risk of death.  I reviewed this with the patient.  She is understanding.  She is leaning towards nonoperative management.  I encouraged her to follow-up with her cardiologist to obtain perioperative risk stratification for open aneurysm repair.  I also referred her  to Dr. Nadeen Landau of [facility] surgery for discussion of her colon cancer.  I will see her again in 1 month to make a definitive plan regarding this aneurysm.Marland Kitchen  Yevonne Aline. Stanford Breed, MD [facility] Phone Number: [phone] 01/14/2022 2:40 PM

## 2022-01-15 ENCOUNTER — Encounter: Payer: Self-pay | Admitting: Vascular Surgery

## 2022-01-15 ENCOUNTER — Ambulatory Visit: Payer: Medicare Other | Admitting: Vascular Surgery

## 2022-01-15 VITALS — BP 140/72 | HR 63 | Temp 98.1°F | Resp 20 | Ht 62.0 in | Wt 112.0 lb

## 2022-01-15 DIAGNOSIS — I7142 Juxtarenal abdominal aortic aneurysm, without rupture: Secondary | ICD-10-CM

## 2022-01-17 ENCOUNTER — Telehealth: Payer: Self-pay | Admitting: Gastroenterology

## 2022-01-17 NOTE — Telephone Encounter (Signed)
Urgent referral in [place] from [place] for malignant neoplasm of transverse colon and needs urgent colonoscopy. Records in Epic under media tab.  Please advise scheduling.

## 2022-01-18 ENCOUNTER — Telehealth: Payer: Self-pay | Admitting: Nurse Practitioner

## 2022-01-18 NOTE — Progress Notes (Signed)
Error

## 2022-01-18 NOTE — Telephone Encounter (Signed)
Scheduled appt per 8/9 referral. Pt is aware of appt date and time. Pt is aware to arrive 15 mins prior to appt time and to bring and updated insurance card. Pt is aware of appt location.

## 2022-01-19 NOTE — Telephone Encounter (Signed)
Urgent APP office appt for evaluation and consideration of colonoscopy.

## 2022-01-21 NOTE — Telephone Encounter (Signed)
Patient has been scheduled for appointment with Nevin Bloodgood, NP on 02/21/22 at 11:30 am. She is aware that I've placed her on a wait list so that if a sooner appointment becomes available our office will give her a call.

## 2022-01-22 NOTE — Progress Notes (Signed)
[place]   [phone]) [phone] Fax:([phone]) [facility] Note   Patient Care Team: Plotnikov, Evie Lacks, MD as PCP - General (Internal Medicine) Lorretta Harp, MD as PCP - Cardiology (Cardiology) Bond, Tracie Harrier, MD as Referring Physician (Ophthalmology) Gerarda Fraction, MD as Referring Physician (Ophthalmology) Marti Sleigh, MD as Attending Physician (Gynecology) Early, Arvilla Meres, MD as Consulting Physician (Vascular Surgery) Ralene Ok, MD as Consulting Physician (General Surgery) Erlene Quan, PA-C (Inactive) as Physician Assistant (Cardiology) Drake Leach, [place] as Consulting Physician (Optometry) Szabat, Darnelle Maffucci, [facility] (Inactive) as Pharmacist (Pharmacist) Gery Pray, MD as Consulting Physician (Radiation Oncology) Date of Service: 01/23/2022  CHIEF COMPLAINTS/PURPOSE OF CONSULTATION:  Transverse colon mass, suspicious for malignancy; referred by colorectal surgeon Dr. Nadeen Landau  HISTORY OF PRESENTING ILLNESS:  Tammy Brooks 88 y.o. female with PMH including HTN, PVD, abdominal aortic aneurysm, MI, CAD, aortic stenosis, hypothyroidism, gout, osteoarthritis, and bilateral (RUL and LLL) non-small cell carcinoma is here because of a transverse colon mass and suspected malignancy.  During work-up for lung cancer she underwent PET scan 06/12/2020 and found to have intense hypermetabolism (max SUV 19.1) associated with a short segment of distal transverse colon with associated wall thickening, a colonoscopy was recommended but patient was not informed.  She underwent definitive lung cancer treatment with SBRT to the right and left lungs by Dr. Sondra Come 07/17/2020 - 07/27/2020.  Lung cancer surveillance CTs have been stable. She underwent surveillance CTA abdomen/pelvis 11/15/2021 which showed stable 6.2 cm AAA as well as circumferential wall thickening and enhancement of the distal transverse colon with evidence concerning for focal  enhancing soft tissue nodules in the mesentery and left upper quadrant adjacent to the splenic flexure largest up to 12 mm suspicious for carcinomatosis.  No strong evidence of distant metastasis on last CT chest 09/10/2021.  She was seen by Dr. Dema Severin and referred to [place] and GI, appointment with Tye Savoy 02/21/2022.  Socially she lives in [place] with her youngest son and his wife on property with another son.  3 of her 5 children are deceased. 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  She formally drink mild alcohol with mixed drinks but none in 1 year.  She is a current 70+ year cigarette smoker up to 1 pack/day currently smoking half pack per day.  She denies other drug use.  Patient was up-to-date on cancer screenings until she aged out.  A maternal aunt had metastatic breast cancer to liver and a paternal grandmother had a cancer of unknown type.  Today she presents with her oldest son Tammy Brooks.  She is tearful, trying to come to terms with the current situation.  Over the past 6-12 months she has low appetite and approximately 30 pounds weight loss.  She has gained back 6 pounds due to eating chocolate trying to control her low blood sugar.  She is fatigued, with difficulty doing housework has to spread out over days.  She has chronic intermittent constipation present for many years managed with milk of magnesia once a month and "natural vegetable" supplement.  Stools are dark but not obviously bloody.  She denies abdominal pain.  She has a hernia in the low pelvis that she has to "carry around" due to pressure.  MEDICAL HISTORY:  Past Medical History:  Diagnosis Date   Abdominal aortic aneurysm ([employer])    followed by Dr. Sherren Mocha Early   Arthritis    hands   Blind left  eye    CAD (coronary artery disease) 08/1995   50-55% second diag dz.; occluded OM   Cataract    Complication of anesthesia    "Blood pressure went down" with retinal surgery - no problems with  previous surgeries   Echocardiogram abnormal 03/2012   mild to mod AS, valve area1.58 cm2, mild concentric LVH, grade I diastolic dusfunction   Gout    Heart murmur    History of radiation therapy 07/17/2020-07/27/2020   SBRT to right and left lung    Dr Gery Pray   Hyperlipidemia    Hypertension    Hypothyroidism    MI (myocardial infarction) ([place]) 01/25/2018   Myocardial infarction [facility])    mild/ age 41   nscl ca 07/2020   Osteoporosis    PAD (peripheral artery disease) ([place])    mild to mod Rt ICA stenosis followed by doppler-02/05/12   Pelvic mass    Stroke ([place]) 2001   no residual problems   Thyroid disease    Tobacco abuse    Ulcer     SURGICAL HISTORY: Past Surgical History:  Procedure Laterality Date   ABDOMINAL HYSTERECTOMY     APPENDECTOMY     BRONCHIAL BIOPSY  06/13/2020   Procedure: BRONCHIAL BIOPSIES;  Surgeon: Garner Nash, DO;  Location: [place] [facility];  Service: Pulmonary;;   BRONCHIAL BRUSHINGS  06/13/2020   Procedure: BRONCHIAL BRUSHINGS;  Surgeon: Garner Nash, DO;  Location: [facility];  Service: Pulmonary;;   BRONCHIAL NEEDLE ASPIRATION BIOPSY  06/13/2020   Procedure: BRONCHIAL NEEDLE ASPIRATION BIOPSIES;  Surgeon: Garner Nash, DO;  Location: [facility];  Service: Pulmonary;;   BRONCHIAL WASHINGS  06/13/2020   Procedure: BRONCHIAL WASHINGS;  Surgeon: Garner Nash, DO;  Location: [facility];  Service: Pulmonary;;   CARDIAC CATHETERIZATION  08/1995   50-55% second diag lesion, occluded OM   CARDIAC CATHETERIZATION  01/25/2018   results located in [place], BILATERAL     COLONOSCOPY     EYE SURGERY Left    lense implant removed/ blind in left eye   FIDUCIAL MARKER PLACEMENT  06/13/2020   Procedure: FIDUCIAL MARKER PLACEMENT;  Surgeon: Garner Nash, DO;  Location: [facility];  Service: Pulmonary;;   LAPAROTOMY N/A 04/26/2014   Procedure: EXPLORATORY LAPAROTOMY/RESECTION OF PELVIC MASS;   Surgeon: Alvino Chapel, MD;  Location: [facility];  Service: Gynecology;  Laterality: N/A;   OOPHORECTOMY     and fallopian tube   POLYPECTOMY     RETINAL DETACHMENT SURGERY  2006   TONSILLECTOMY AND ADENOIDECTOMY     TUBAL LIGATION     UPPER GASTROINTESTINAL ENDOSCOPY     treated for h-pylori   VIDEO BRONCHOSCOPY WITH ENDOBRONCHIAL NAVIGATION N/A 06/13/2020   Procedure: VIDEO BRONCHOSCOPY WITH ENDOBRONCHIAL NAVIGATION;  Surgeon: Garner Nash, DO;  Location: [facility];  Service: Pulmonary;  Laterality: N/A;   VIDEO BRONCHOSCOPY WITH ENDOBRONCHIAL ULTRASOUND  06/13/2020   Procedure: VIDEO BRONCHOSCOPY WITH ENDOBRONCHIAL ULTRASOUND;  Surgeon: Garner Nash, DO;  Location: [facility];  Service: Pulmonary;;    SOCIAL HISTORY: Social History   Socioeconomic History   Marital status: Widowed    Spouse name: Not on file   Number of children: 5   Years of education: 9   Highest education level: 9th grade  Occupational History   Occupation: retired    Comment: retired  Tobacco Use   Smoking status: Every Day    Packs/day: 0.50  Years: 68.00    Total pack years: 34.00    Types: Cigarettes   Smokeless tobacco: Never  Vaping Use   Vaping Use: Never used  Substance and Sexual Activity   Alcohol use: Not Currently    Alcohol/week: 0.0 standard drinks of alcohol    Comment: occasionally - few drinks/month   Drug use: Never   Sexual activity: Not Currently    Partners: Male    Birth control/protection: Surgical    Comment: Hysterectomy  Other Topics Concern   Not on file  Social History Narrative   9th grade. married 08/04/2052- widowed Dec 10th'08. 4 sons - '55, '58, '60, '69 (two of these have passes away); 1 daughter - 2061/08/04 (deceased); 12 grandchildren; 49 great-grandchildren. 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 Other children are close and attentive. ACP/End-of-life: No CPR, no mechanical ventilation, no futile/heroic  measures.   Social Determinants of Health   Financial Resource Strain: Low Risk  (08/06/2021)   Overall Financial Resource Strain (CARDIA)    Difficulty of Paying Living Expenses: Not hard at all  Food Insecurity: No Food Insecurity (08/06/2021)   Hunger Vital Sign    Worried About Running Out of Food in the Last Year: Never true    Ran Out of Food in the Last Year: Never true  Transportation Needs: No Transportation Needs (08/06/2021)   PRAPARE - Hydrologist (Medical): No    Lack of Transportation (Non-Medical): No  Physical Activity: Inactive (08/06/2021)   Exercise Vital Sign    Days of Exercise per Week: 0 days    Minutes of Exercise per Session: 0 min  Stress: No Stress Concern Present (08/06/2021)   [place]    Feeling of Stress : Only a little  Social Connections: Socially Isolated (08/06/2021)   Social Connection and Isolation Panel [NHANES]    Frequency of Communication with Friends and Family: Twice a week    Frequency of Social Gatherings with Friends and Family: Twice a week    Attends Religious Services: Never    Marine scientist or Organizations: No    Attends Archivist Meetings: Never    Marital Status: Widowed  Intimate Partner Violence: Not At Risk (08/06/2021)   Humiliation, Afraid, Rape, and Kick questionnaire    Fear of Current or Ex-Partner: No    Emotionally Abused: No    Physically Abused: No    Sexually Abused: No    FAMILY HISTORY: Family History  Problem Relation Age of Onset   Aneurysm Mother        AAA   Hypertension Mother    Hyperlipidemia Mother    Heart disease Father        CAD, MI x 3   Heart attack Father    Cancer Maternal Aunt        breast cancer   Diabetes Sister    Hyperlipidemia Sister    Hypertension Sister    Colon cancer Neg Hx    Stomach cancer Neg Hx     ALLERGIES:  is allergic to nabumetone and tape.  MEDICATIONS:   Current Outpatient Medications  Medication Sig Dispense Refill   acetaminophen (TYLENOL) 325 MG tablet Take 325 mg by mouth every 6 (six) hours as needed (pain.).     allopurinol (ZYLOPRIM) 100 MG tablet Take 0.5 tablets (50 mg total) by mouth daily. 45 tablet 3  amLODipine (NORVASC) 5 MG tablet Take 1 tablet (5 mg total) by mouth daily. 90 tablet 2   atorvastatin (LIPITOR) 40 MG tablet Take 2 tablets (80 mg total) by mouth daily. 180 tablet 2   Cholecalciferol (VITAMIN D3) 50 MCG (2000 UT) TABS Take 2,000 Units by mouth daily.     clopidogrel (PLAVIX) 75 MG tablet Take 1 tablet (75 mg total) by mouth daily. 90 tablet 2   furosemide (LASIX) 20 MG tablet TAKE 1-2 TABLETS BY MOUTH  DAILY AS NEEDED FOR EDEMA. 180 tablet 1   isosorbide mononitrate (IMDUR) 30 MG 24 hr tablet Take 1 tablet (30 mg total) by mouth daily. 90 tablet 3   levothyroxine (SYNTHROID) 50 MCG tablet Take 1 tablet (50 mcg total) by mouth daily. 90 tablet 3   losartan (COZAAR) 25 MG tablet Take 1 tablet (25 mg total) by mouth daily. 90 tablet 2   metoprolol tartrate (LOPRESSOR) 25 MG tablet TAKE ONE-HALF TABLET BY  MOUTH TWICE DAILY 90 tablet 3   Multiple Vitamins-Minerals (PRESERVISION/LUTEIN) CAPS Take 1 capsule by mouth 3 (three) times a week. In the morning     Naproxen Sodium (ALEVE PO) Take 220 mg by mouth 2 (two) times daily as needed.     nitroGLYCERIN (NITROSTAT) 0.4 MG SL tablet Place 1 tablet (0.4 mg total) under the tongue every 5 (five) minutes x 3 doses as needed for chest pain. 20 tablet 3   tiZANidine (ZANAFLEX) 4 MG tablet Take 1 tablet (4 mg total) by mouth every 8 (eight) hours as needed for muscle spasms. 90 tablet 2   traMADol (ULTRAM) 50 MG tablet Take 0.5-1 tablets (25-50 mg total) by mouth every 8 (eight) hours as needed. 100 tablet 2   No current facility-administered medications for this visit.    REVIEW OF SYSTEMS:   Constitutional: Denies fevers, chills or abnormal night sweats (+) 20-30 pounds  weight loss in 1 year (+) fatigue Eyes: Denies blurriness of vision, double vision or watery eyes Ears, nose, mouth, throat, and face: Denies mucositis or sore throat Respiratory: Denies cough, dyspnea or wheezes  Cardiovascular: Denies palpitation, chest discomfort or lower extremity swelling (+) AAA (+) history of MI Gastrointestinal:  Denies nausea, vomiting, diarrhea, hematochezia, heartburn or change in bowel habits (+) chronic intermittent constipation (+) hernia Skin: Denies abnormal skin rashes Lymphatics: Denies new lymphadenopathy or easy bruising Neurological:Denies numbness, tingling or new weaknesses Behavioral/Psych: Mood is stable, no new changes (+) tearful All other systems were reviewed with the patient and are negative.  PHYSICAL EXAMINATION: ECOG PERFORMANCE STATUS: 1-2  Vitals:   01/23/22 1104  BP: 130/61  Pulse: 67  Resp: 17  Temp: 97.6 F (36.4 C)  SpO2: 100%   Filed Weights   01/23/22 1104  Weight: 111 lb 8 oz (50.6 kg)    GENERAL:alert, no distress and comfortable SKIN: No rash EYES: sclera clear NECK: Without mass LYMPH:  no palpable cervical or supraclavicular lymphadenopathy LUNGS: clear with normal breathing effort HEART: regular rate & rhythm, +murmur, no lower extremity edema ABDOMEN:abdomen soft, non-tender and normal bowel sounds.  +hernia Musculoskeletal:no cyanosis of digits and no clubbing  PSYCH: alert & oriented x 3 with fluent speech NEURO: no focal motor/sensory deficits  LABORATORY DATA:  I have reviewed the data as listed    Latest Ref Rng & Units 08/14/2021    8:11 AM 06/20/2021    9:33 AM 02/15/2021    3:45 PM  CBC  WBC 4.0 - 10.5 K/uL 7.5  8.8  7.0   Hemoglobin 12.0 - 15.0 g/dL 11.9  11.5  13.2   Hematocrit 36.0 - 46.0 % 36.2  35.1  40.2   Platelets 150.0 - 400.0 K/uL 213.0  284.0  177.0       Latest Ref Rng & Units 08/14/2021    8:11 AM 06/20/2021    9:33 AM 02/15/2021    3:45 PM  CMP  Glucose 70 - 99 mg/dL 89  101   94   BUN 6 - 23 mg/dL _0 Creatinine 0.40 - 1.20 mg/dL 1.18  1.00  1.15   Sodium 135 - 145 mEq/L 139  142  141   Potassium 3.5 - 5.1 mEq/L 4.1  4.0  4.7   Chloride 96 - 112 mEq/L 105  107  107   CO2 19 - 32 mEq/L _1 Calcium 8.4 - 10.5 mg/dL 9.7  8.9  9.3   Total Protein 6.0 - 8.3 g/dL 6.3  6.7  6.4   Total Bilirubin 0.2 - 1.2 mg/dL 0.4  0.4  0.5   Alkaline Phos 39 - 117 U/L 60  52  56   AST 0 - 37 U/L _2 ALT 0 - 35 U/L _3 RADIOGRAPHIC STUDIES: I have personally reviewed the radiological images as listed and agreed with the findings in the report. No results found.  ASSESSMENT & PLAN: 88 year old female  1.Transverse colonic mass, suspicious for primary colon cancer and possible local peritoneal involvement -We reviewed her medical record in detail with the patient and her son -She was initially found to have PET avid transverse colon mass during lung cancer staging in 06/2020, pet Pt she was not informed and did not pursue colonoscopy  -She was again seen to have transverse colon mass suspicious for adenocarcinoma on AAA surveillance image, with new concern for possible local peritoneal spread  -She is asymptomatic, with unchanged chronic constipation.  -Given the concern for carcinomatosis, and that she has no evidence of obstruction or severe bleeding at this time, there is no indication for upfront surgery. She was seen by Dr. Dema Severin who felt her to be a poor surgical candidate given her age, AAA, and other co-morbidities  -We do recommend to proceed with colonoscopy for tissue diagnosis and molecular testing to see if she is a candidate for immunotherapy or targeted therapy. She is reluctant due to her AAA but will discuss with GI. -If adenocarcinoma is confirmed, we briefly discussed palliative chemotherapy, such as single agent Xeloda. Due to her age, low PS, and co-morbidities chemo would be challenging and she is not a candidate for  intensive chemotherapy. She and her family agree on that.  -We discussed typically radiation is not done in this situation but if colon cancer is confirmed and no other suitable treatment option, especially if she develops signs of obstruction, we would ask rad onc to consider palliative radiation to the primary tumor.  -We also discussed that palliative care and/or hospice alone are reasonable, given her age, co-morbidities, and goals of care. Appreciate palliative care to help clarify those goals. She is currently getting her affairs in order and meeting with an Forensic scientist on 9/7 -We reviewed the risks of untreated colon cancer including possible obstruction, perforation, bleeding, and pain. She knows what to watch -We have reviewed her case with her care team including GI, cardiology, and surgery.  We appreciate their input in this difficult situation -If she proceeds with colonoscopy, we plan to see her back 3 weeks later, to review path, MMR/MSI, and FO and finalize her plan.  -She will let us know if she decides not to pursue work up. -Pt seen with Dr. Burr Medico and met Ihor Gully, GI navigator; has our contact info  2.  Fatigue, weight loss, chronic constipation -She has chronic constipation of years, unchanged; managed with MOM once a month and "natural vegetable" supplement  -her fatigue and 20-30 lbs weight loss over the past year are likely secondary to #1. She has gained ~6 lbs recently eating better -she is functional at home, with rest, and support from her family with whom she lives  3.  Juxtarenal AAA -now measuring 6.3 cm -surgical repair is high risk for mortality, she is not pursuing -Pt tells me her mother and sister both had AAA's, her mother had the surgery but did not survive it -I discussed her case with cardiology who feels she is ok to undergo colonoscopy - the risk is low to moderate, given no intubation or vascular clamping  -She is scheduled to meet with GI 9/21 to  discuss colonoscopy, they are working to move up her appt.   4. History of right and left lung cancers, non-small cell -RUL and LLL NSCLC 06/2020 s/p SBRT in 07/2020 by Dr. Sondra Come -surveillance CT chest scans have been stable without recurrence  -she has 70+ year smoking history, current daily 1/2 day smoker  5.  Social, family history -She lives in [place] with her youngest son and his wife, oldest son also lives on the property  -She has 5 children, 3 are deceased -Family history includes mat aunt with metastatic breast cancer and PGM with cancer of unknown type -Given this is a second cancer diagnosis and her family history, she agrees to genetic testing and has been referred   6.  Goals of care -Given that she is not a good surgical candidate, and not very interested in surgery due to her AAA and other risks, cancer treatment will be palliative.  -We did not discuss code status today, will address -She has been referred to community palliative care by her cardiologist, will ask them to touch base   PLAN: -Medical record reviewed -Pt considering to proceed with colonoscopy, will request MMR/MSI and FO on the biopsy  -Lab (CBC, CMP, Ferritin/iron panel, CEA, and genetic panel) and F/up 3 weeks after colonoscopy -Appreciate palliative care, cardiology, GI, and surgery input  -Pt will let us know if she declines further work up   Orders Placed This Encounter  Procedures   CBC with Differential ([facility] Only)    Standing Status:   Future    Standing Expiration Date:   01/24/2023   CEA (IN HOUSE-[facility])FOR [facility] ONLY    Standing Status:   Future    Standing Expiration Date:   01/24/2023   Iron and Iron Binding Capacity ([facility] only)    Standing Status:   Future    Standing Expiration Date:   01/24/2023   Ferritin    Standing Status:   Future    Standing Expiration Date:   01/24/2023   CMP ([facility] only)    Standing Status:   Future    Standing Expiration Date:    01/24/2023   Ambulatory referral to Genetics    Referral Priority:   Routine    Referral Type:   Consultation    Referral Reason:  Specialty Services Required    Number of Visits Requested:   1    All questions were answered. The patient knows to call the clinic with any problems, questions or concerns.     Alla Feeling, NP 01/24/2022    Addendum I have seen the patient, examined her. I agree with the assessment and and plan and have edited the notes.   88 yo female with PMH of HTN, PVD, abdominal aortic aneurysm, MI, CAD, aortic stenosis, hypothyroidism, gout, osteoarthritis, and bilateral (RUL and LLL) non-small cell carcinoma, s/p RT, was referred for probable transverse colon cancer.  She was noticed to have a hypermetabolic mass in the transverse colon at her PET scan in Jan 2022, but per pt she was told about this findings and did not pursue colonoscopy.  Recent CT scan from November 15, 2021 which was ordered for aneurysm follow-up, she has a large mass in the transverse colon, highly suspicious for adenocarcinoma, and probable local peritoneal metastasis. She is scheduled to see GI to discuss colonoscopy.  She is asymptomatic from her colon mass and metastasis.  Due to her advanced age and multiple medical comorbidities, especially her severe aortic aneurysm, she is not a good candidate for colon resection.  Given the suspicion for peritoneal metastasis, we would not recommend colon surgery unless she has obstruction or severe bleeding or perforation.  I do think it is reasonable to have colonoscopy to get a tissue diagnosis, if she can tolerate sedation for the procedure, and I will check MMR/MSI, and the molecular testing, to see if she is a candidate for immunotherapy or targeted therapy.  I have reviewed the above with patient and her son in details, she is reluctant to consider any further work-up or treatment, but agrees to meet GI and discuss colonoscopy. We will communicate with GI and  colorectal surgeon Dr. Dema Severin whom she is scheduled to see in near future, to finalize her management plan.  Questions were answered.  I plan to see her back 3 weeks after colonoscopy and biopsy, to allow me get MSI and FO tests result back on her biopsy sample.   Truitt Merle MD  01/23/2022

## 2022-01-23 ENCOUNTER — Telehealth: Payer: Self-pay

## 2022-01-23 ENCOUNTER — Other Ambulatory Visit: Payer: Self-pay

## 2022-01-23 ENCOUNTER — Other Ambulatory Visit: Payer: Self-pay | Admitting: Genetic Counselor

## 2022-01-23 ENCOUNTER — Other Ambulatory Visit: Payer: Medicare Other

## 2022-01-23 ENCOUNTER — Encounter: Payer: Self-pay | Admitting: Nurse Practitioner

## 2022-01-23 ENCOUNTER — Inpatient Hospital Stay: Payer: Medicare Other | Attending: Nurse Practitioner | Admitting: Nurse Practitioner

## 2022-01-23 VITALS — BP 130/61 | HR 67 | Temp 97.6°F | Resp 17 | Wt 111.5 lb

## 2022-01-23 DIAGNOSIS — K5909 Other constipation: Secondary | ICD-10-CM | POA: Diagnosis not present

## 2022-01-23 DIAGNOSIS — K6389 Other specified diseases of intestine: Secondary | ICD-10-CM | POA: Insufficient documentation

## 2022-01-23 DIAGNOSIS — Z85118 Personal history of other malignant neoplasm of bronchus and lung: Secondary | ICD-10-CM | POA: Insufficient documentation

## 2022-01-23 DIAGNOSIS — R5383 Other fatigue: Secondary | ICD-10-CM | POA: Insufficient documentation

## 2022-01-23 DIAGNOSIS — R634 Abnormal weight loss: Secondary | ICD-10-CM | POA: Diagnosis not present

## 2022-01-23 DIAGNOSIS — F17210 Nicotine dependence, cigarettes, uncomplicated: Secondary | ICD-10-CM | POA: Insufficient documentation

## 2022-01-23 DIAGNOSIS — Z809 Family history of malignant neoplasm, unspecified: Secondary | ICD-10-CM | POA: Insufficient documentation

## 2022-01-23 DIAGNOSIS — Z803 Family history of malignant neoplasm of breast: Secondary | ICD-10-CM | POA: Diagnosis not present

## 2022-01-23 DIAGNOSIS — D649 Anemia, unspecified: Secondary | ICD-10-CM | POA: Insufficient documentation

## 2022-01-23 DIAGNOSIS — I7142 Juxtarenal abdominal aortic aneurysm, without rupture: Secondary | ICD-10-CM | POA: Diagnosis not present

## 2022-01-23 DIAGNOSIS — C3490 Malignant neoplasm of unspecified part of unspecified bronchus or lung: Secondary | ICD-10-CM

## 2022-01-23 NOTE — Telephone Encounter (Signed)
-----   Message from Willia Craze, NP sent at 01/23/2022 12:51 PM EDT ----- Regan Rakers,  I am including my nurse Mickel Baas on here to see if she can help find this lady a sooner appt with me or anyone in the group. Thanks ----- Message ----- From: Alla Feeling, NP Sent: 01/23/2022  12:46 PM EDT To: Ileana Roup, MD; #  Good afternoon, Very sweet but unfortunate lady who has had a colon mass since at least 06/2020 when found incidentally on lung cancer staging PET. She is reluctant to proceed GI work up due to her risk of AAA rupture, which is significantly impacting her decision making. She does agree to talk to GI about colonoscopy and has an appt with Tammy Brooks on 9/21. Any chance we could move that up sooner? Just not 9/7 because she is meeting with an Forensic scientist on that day.   Curt Bears,  Could you speak to her risk of spontaneous rupture? She is quite concerned about sudden death and did mention being interested in community palliative care, I see that you referred her but has not heard from anyone. She and her son, Tammy Brooks, prefer an initial phone call. Thanks!   Carmelina Dane, I referred her to genetics. This is her second cancer, plus other family history. Could you do lab at next visit? She also prefers a phone call and asked that you include her son.   If she does proceed with colonoscopy, we plan to see her back 3 weeks later so we have path and all molecular studies back. Santiago Glad B, could you help schedule that visit once dates are set?   Dr. Dema Severin, I suspect she is not a surgical candidate even if she has early stage colon cancer?    Thanks everyone, Regan Rakers

## 2022-01-23 NOTE — Progress Notes (Signed)
I met with Tammy Brooks and her son, Tammy Brooks after her consultation with Lacie Burton, NP-C and Dr Feng.  I explained my role as a nurse navigator and provided my contact information. All questions were answered.  They verbalized understanding.  

## 2022-01-23 NOTE — Telephone Encounter (Signed)
Pt scheduled on 02/11/22 at 1:30 pm with Carl Best, NP. Pt verbalized understanding and had no other concerns at end of call.

## 2022-01-24 ENCOUNTER — Encounter: Payer: Self-pay | Admitting: Nurse Practitioner

## 2022-01-24 ENCOUNTER — Inpatient Hospital Stay: Payer: Medicare Other

## 2022-01-24 DIAGNOSIS — C3490 Malignant neoplasm of unspecified part of unspecified bronchus or lung: Secondary | ICD-10-CM

## 2022-01-24 DIAGNOSIS — K5909 Other constipation: Secondary | ICD-10-CM | POA: Diagnosis not present

## 2022-01-24 DIAGNOSIS — C786 Secondary malignant neoplasm of retroperitoneum and peritoneum: Secondary | ICD-10-CM | POA: Diagnosis not present

## 2022-01-24 DIAGNOSIS — K6389 Other specified diseases of intestine: Secondary | ICD-10-CM | POA: Diagnosis not present

## 2022-01-24 DIAGNOSIS — D649 Anemia, unspecified: Secondary | ICD-10-CM | POA: Diagnosis not present

## 2022-01-24 DIAGNOSIS — Z85118 Personal history of other malignant neoplasm of bronchus and lung: Secondary | ICD-10-CM | POA: Diagnosis not present

## 2022-01-24 DIAGNOSIS — Z809 Family history of malignant neoplasm, unspecified: Secondary | ICD-10-CM | POA: Diagnosis not present

## 2022-01-24 DIAGNOSIS — R634 Abnormal weight loss: Secondary | ICD-10-CM | POA: Diagnosis not present

## 2022-01-24 DIAGNOSIS — I7142 Juxtarenal abdominal aortic aneurysm, without rupture: Secondary | ICD-10-CM | POA: Diagnosis not present

## 2022-01-24 DIAGNOSIS — C189 Malignant neoplasm of colon, unspecified: Secondary | ICD-10-CM | POA: Diagnosis not present

## 2022-01-24 DIAGNOSIS — Z803 Family history of malignant neoplasm of breast: Secondary | ICD-10-CM | POA: Diagnosis not present

## 2022-01-24 DIAGNOSIS — R5383 Other fatigue: Secondary | ICD-10-CM | POA: Diagnosis not present

## 2022-01-24 DIAGNOSIS — F17210 Nicotine dependence, cigarettes, uncomplicated: Secondary | ICD-10-CM | POA: Diagnosis not present

## 2022-01-24 LAB — FERRITIN: Ferritin: 24 ng/mL (ref 11–307)

## 2022-01-24 LAB — CMP (CANCER CENTER ONLY)
ALT: 11 U/L (ref 0–44)
AST: 18 U/L (ref 15–41)
Albumin: 3.6 g/dL (ref 3.5–5.0)
Alkaline Phosphatase: 58 U/L (ref 38–126)
Anion gap: 6 (ref 5–15)
BUN: 16 mg/dL (ref 8–23)
CO2: 27 mmol/L (ref 22–32)
Calcium: 9 mg/dL (ref 8.9–10.3)
Chloride: 110 mmol/L (ref 98–111)
Creatinine: 1.1 mg/dL — ABNORMAL HIGH (ref 0.44–1.00)
GFR, Estimated: 49 mL/min — ABNORMAL LOW (ref >60)
Glucose, Bld: 106 mg/dL — ABNORMAL HIGH (ref 70–99)
Potassium: 4.1 mmol/L (ref 3.5–5.1)
Sodium: 143 mmol/L (ref 135–145)
Total Bilirubin: 0.4 mg/dL (ref 0.3–1.2)
Total Protein: 6.5 g/dL (ref 6.5–8.1)

## 2022-01-24 LAB — CBC WITH DIFFERENTIAL (CANCER CENTER ONLY)
Abs Immature Granulocytes: 0.03 10*3/uL (ref 0.00–0.07)
Basophils Absolute: 0.1 10*3/uL (ref 0.0–0.1)
Basophils Relative: 1 %
Eosinophils Absolute: 0.1 10*3/uL (ref 0.0–0.5)
Eosinophils Relative: 2 %
HCT: 36.5 % (ref 36.0–46.0)
Hemoglobin: 10.9 g/dL — ABNORMAL LOW (ref 12.0–15.0)
Immature Granulocytes: 0 %
Lymphocytes Relative: 25 %
Lymphs Abs: 2 10*3/uL (ref 0.7–4.0)
MCH: 25.6 pg — ABNORMAL LOW (ref 26.0–34.0)
MCHC: 29.9 g/dL — ABNORMAL LOW (ref 30.0–36.0)
MCV: 85.7 fL (ref 80.0–100.0)
Monocytes Absolute: 0.8 10*3/uL (ref 0.1–1.0)
Monocytes Relative: 9 %
Neutro Abs: 5.1 10*3/uL (ref 1.7–7.7)
Neutrophils Relative %: 63 %
Platelet Count: 256 10*3/uL (ref 150–400)
RBC: 4.26 MIL/uL (ref 3.87–5.11)
RDW: 15.9 % — ABNORMAL HIGH (ref 11.5–15.5)
WBC Count: 8.1 10*3/uL (ref 4.0–10.5)
nRBC: 0 % (ref 0.0–0.2)

## 2022-01-24 LAB — IRON AND TIBC
Iron: 30 ug/dL (ref 28–170)
Saturation Ratios: 10 % — ABNORMAL LOW (ref 10.4–31.8)
TIBC: 288 ug/dL (ref 250–450)
UIBC: 258 ug/dL

## 2022-01-25 LAB — CEA: CEA: 4.1 ng/mL (ref 0.0–4.7)

## 2022-01-30 ENCOUNTER — Telehealth: Payer: Self-pay

## 2022-01-30 ENCOUNTER — Other Ambulatory Visit: Payer: Self-pay

## 2022-01-30 NOTE — Telephone Encounter (Signed)
LVM stating that Cira Rue, NP has reviewed the pt's recent labs and the pt's Ferritin (24) is slightly below normal which is and indication of mild anemia.  Instructed pt that Lacie would like for the pt to start taking over the counter iron supplements or a multivitamin with iron.  Instructed pt to contact [facility] office should she have additional questions or concerns.

## 2022-01-30 NOTE — Progress Notes (Signed)
The proposed treatment discussed in conference is for discussion purpose only and is not a binding recommendation.  The patients have not been physically examined, or presented with their treatment options.  Therefore, final treatment plans cannot be decided.  

## 2022-01-31 ENCOUNTER — Other Ambulatory Visit: Payer: Self-pay | Admitting: Internal Medicine

## 2022-02-07 ENCOUNTER — Other Ambulatory Visit: Payer: Self-pay | Admitting: Internal Medicine

## 2022-02-08 ENCOUNTER — Encounter: Payer: Self-pay | Admitting: Genetic Counselor

## 2022-02-08 DIAGNOSIS — Z1379 Encounter for other screening for genetic and chromosomal anomalies: Secondary | ICD-10-CM | POA: Insufficient documentation

## 2022-02-08 DIAGNOSIS — C184 Malignant neoplasm of transverse colon: Secondary | ICD-10-CM | POA: Diagnosis not present

## 2022-02-11 ENCOUNTER — Ambulatory Visit: Payer: Medicare Other | Admitting: Nurse Practitioner

## 2022-02-11 ENCOUNTER — Encounter: Payer: Self-pay | Admitting: Nurse Practitioner

## 2022-02-11 VITALS — BP 120/68 | HR 64 | Ht 61.0 in | Wt 109.4 lb

## 2022-02-11 DIAGNOSIS — K6389 Other specified diseases of intestine: Secondary | ICD-10-CM

## 2022-02-11 NOTE — Progress Notes (Signed)
02/11/2022 FERNANDO TORRY [id number] 1933-10-29   CHIEF COMPLAINT: Colon mass  HISTORY OF PRESENT ILLNESS: Tammy Brooks. Hatchell is an 88 year old female with a past medical history of arthritis, hypertension, hyperlipidemia, coronary artery disease s/p MI age 79, 6.3 cm abdominal aortic aneurysm, severe aortic stenosis, bilateral non-small cell lung cancer s/p radiation 07/2020, anemia and colon polyps.  She presents to our office today as referred by Dr. Nadeen Landau to schedule a colonoscopy due to having a distal transverse colon mass suspicious for colon cancer with probable carcinomatosis.  She underwent  a PET scan 06/12/2020 during her lung cancer work up which identified intense focal hypermetabolism associated with a mildly thick walled short segment of distal transverse colon in the left abdomen, malignancy could not be excluded.  She proceeded with radiation for her lung cancer 2/14 - 07/27/2020.  She underwent a surveillance CTA 11/15/2021 which identified a stable 6.2 cm AAA as well as a circumferential wall thickening and enhancement to the distal transverse colon with evidence concerning for focal enhancing soft tissue nodules in the mesentery and left upper quadrant adjacent to the splenic flexure largest up to 12 mm suspicious for carcinomatosis.  Chest CT without evidence of almanac metastasis.  She was seen by general surgeon Dr. Nadeen Landau who reviewed her case with the multidisciplinary tumor board and probability of surgery curing her was quite low in the setting of having peritoneal carcinomatosis therefore surgical intervention was not recommended.  She was also evaluated by vascular surgeon Dr. Stanford Breed and surgery for her large juxtarenal aortic aneurysm was not recommended as she is at high risk for mortality with any attempts at repair.  She was evaluated by oncology 01/23/2022 he was supported palliative care and recommended a GI consult to consider a diagnostic colonoscopy.   She presents to our office today to discuss scheduling a colonoscopy to confirm her diagnosis.  She is accompanied by her son.  She has a history of 1 small tubular adenomatous polyp removed from the colon in 2012.  No known family history of colorectal cancer.  She has intermittent lower abdominal pain.  She has chronic constipation for which she takes 2 stool softeners as needed.  She typically passes 1 bowel movement once or twice weekly.  No rectal bleeding or black stools.  Her appetite has significantly decreased over the past 6 months.  She is lost 20 to 30 pounds over the past year.  She does not wish to pursue a diagnostic colonoscopy at this juncture.  She intends to proceed with palliative care.      Latest Ref Rng & Units 01/24/2022   11:42 AM 08/14/2021    8:11 AM 06/20/2021    9:33 AM  CBC  WBC 4.0 - 10.5 K/uL 8.1  7.5  8.8   Hemoglobin 12.0 - 15.0 g/dL 10.9  11.9  11.5   Hematocrit 36.0 - 46.0 % 36.5  36.2  35.1   Platelets 150 - 400 K/uL 256  213.0  284.0        Latest Ref Rng & Units 01/24/2022   11:42 AM 08/14/2021    8:11 AM 06/20/2021    9:33 AM  CMP  Glucose 70 - 99 mg/dL 106  89  101   BUN 8 - 23 mg/dL 16  19  13    Creatinine 0.44 - 1.00 mg/dL 1.10  1.18  1.00   Sodium 135 - 145 mmol/L 143  139  142   Potassium  3.5 - 5.1 mmol/L 4.1  4.1  4.0   Chloride 98 - 111 mmol/L 110  105  107   CO2 22 - 32 mmol/L 27  28  25    Calcium 8.9 - 10.3 mg/dL 9.0  9.7  8.9   Total Protein 6.5 - 8.1 g/dL 6.5  6.3  6.7   Total Bilirubin 0.3 - 1.2 mg/dL 0.4  0.4  0.4   Alkaline Phos 38 - 126 U/L 58  60  52   AST 15 - 41 U/L 18  17  13    ALT 0 - 44 U/L 11  12  8     Iron 30  Abdominal/pelvic CTA 11/15/2021: FINDINGS: VASCULAR   Aorta: Advanced atherosclerotic calcification of the lower thoracic and the abdominal aorta.   Redemonstration of juxtarenal abdominal aortic aneurysm. The greatest diameter on the most recent comparison of 06/03/2021 was on the parasagittal reformatted  images, approximately 6.2 cm. In a similar location on the parasagittal images, the current diameter is 6.2 cm. No change in configuration. No periaortic fluid or inflammatory changes.   Throughout most of the aneurysm sac, there is at least 60-75% circumferential thrombus formation, with the flow channel relatively maintained.   Celiac: Celiac artery remains patent with minimal atherosclerotic changes at the origin.   SMA: SMA remains patent with mild atherosclerotic changes at the origin.   Renals:   - Right: Right renal artery patent, with moderate atherosclerotic changes at the origin, with perhaps 50% narrowing at the origin on the coronal images.   - Left: Left renal artery patent, with moderate atherosclerotic changes at the origin, perhaps 50% narrowing, best seen on coronal images.   IMA: IMA occluded at the origin.   Right lower extremity:   The right common iliac artery appears patent, given the maintained flow channel from the aorta into the iliac system, however, there is likely a high-grade stenosis at the right CIA origin secondary to calcified plaque.   Atherosclerotic changes present throughout the right CIA, into the hypogastric artery, and the proximal external iliac artery.   Greatest diameter of the right CIA approximately 15 mm.   No aneurysm of the external iliac artery.   Posterior wall calcifications of the common femoral artery, which appears patent.   Proximal profunda femoris and SFA patent.   Left lower extremity:   Left common iliac artery patent. Greatest diameter measures 19 mm on the axial images. Atherosclerotic changes present throughout the left CIA, into the hypogastric artery, and the external iliac artery.   No aneurysm of the left EIA.   Posterior wall calcifications of the left common femoral artery. Proximal profunda femoris and SFA patent.   Veins: Unremarkable appearance of the venous system.   Review of the MIP  images confirms the above findings.   NON-VASCULAR   Lower chest: Emphysematous changes of the lower chest. Scarring at the left lung base again noted. Similar appearance of pericardial fluid/thickening. No acute findings.   Hepatobiliary: Unremarkable appearance of the liver. Unremarkable gall bladder.   Pancreas: Unremarkable.   Spleen: Unremarkable.   Adrenals/Urinary Tract:   - Right adrenal gland: Unremarkable   - Left adrenal gland: Unremarkable.   - Right kidney: No hydronephrosis, nephrolithiasis, inflammation, or ureteral dilation. No focal lesion.   - Left Kidney: No hydronephrosis, nephrolithiasis, inflammation, or ureteral dilation. Cystic lesion at the inferior left kidney, 28 mm, with no internal complexity or enhancement. Hounsfield units estimated 6, fluid density.   - Urinary Bladder: Urinary bladder  relatively decompressed.   Stomach/Bowel:   - Stomach: Unremarkable.   - Small bowel: Unremarkable   - Appendix: Appendix is not visualized, however, no inflammatory changes are present adjacent to the cecum to indicate an appendicitis.   - Colon: Circumferential wall thickening and enhancement of the distal transverse colon, which was described on prior PET-CT and prior comparison abdominal CT. Additionally there are focal enhancing soft tissue nodules of the colonic mesentery, as well as within the left upper quadrant adjacent to the splenic flexure largest measures 12 mm. No evidence of obstruction. Moderate formed stool burden.   Lymphatic: No adenopathy.   Mesenteric: No free air or free fluid within the abdomen.   Reproductive: Hysterectomy   Other: Similar appearance of left femoral hernia containing mostly fat as well as short segment of small bowel.   Musculoskeletal: Multilevel degenerative changes of the spine. No acute displaced fracture. No bony canal narrowing. No suspicious sclerotic or lucent lesions. Degenerative changes of the  hips.   IMPRESSION: Adenocarcinoma of the transverse colon, with evidence of local peritoneal metastases. Follow-up with oncology is indicated.   Essentially unchanged size and configuration of juxtarenal abdominal aortic aneurysm, with the greatest estimated dimension 6.2 cm, unchanged from January 2023. Aortic aneurysm NOS (ICD10-I71.9).   High-grade stenosis of the right CIA origin is suspected given the degree of calcifications and late arrival of the contrast bolus. Associated diffuse atherosclerotic changes of the bilateral iliac system, with right CIA greatest estimated diameter being 15 mm, and estimated greatest diameter of the left CIA 19 mm.   Aortic atherosclerosis and associated mesenteric and renal artery disease. The degree of ostial stenosis of the bilateral renal arteries is estimated 50%. Aortic Atherosclerosis (ICD10-I70.0).   Unchanged pericardial effusion.   Additional ancillary findings as above.   ECHO 06/22/2021:     Past Medical History:  Diagnosis Date   Abdominal aortic aneurysm ([place])    followed by Dr. Sherren Mocha Early   Arthritis    hands   Blind left eye    CAD (coronary artery disease) 08/1995   50-55% second diag dz.; occluded OM   Cataract    Complication of anesthesia    "Blood pressure went down" with retinal surgery - no problems with previous surgeries   Echocardiogram abnormal 03/2012   mild to mod AS, valve area1.58 cm2, mild concentric LVH, grade I diastolic dusfunction   Gout    Heart murmur    History of radiation therapy 07/17/2020-07/27/2020   SBRT to right and left lung    Dr Gery Pray   Hyperlipidemia    Hypertension    Hypothyroidism    Lung cancer ([place])    MI (myocardial infarction) ([place]) 01/25/2018   Myocardial infarction [facility])    mild/ age 47   nscl ca 07/2020   Osteoporosis    PAD (peripheral artery disease) ([facility])    mild to mod Rt ICA stenosis followed by doppler-02/05/12   Pelvic mass    Stroke ([place]) 2001   no  residual problems   Thyroid disease    Tobacco abuse    Ulcer    Past Surgical History:  Procedure Laterality Date   ABDOMINAL HYSTERECTOMY     APPENDECTOMY     BRONCHIAL BIOPSY  06/13/2020   Procedure: BRONCHIAL BIOPSIES;  Surgeon: Garner Nash, DO;  Location: [facility];  Service: Pulmonary;;   BRONCHIAL BRUSHINGS  06/13/2020   Procedure: BRONCHIAL BRUSHINGS;  Surgeon: Garner Nash, DO;  Location: [facility];  Service: Pulmonary;;  BRONCHIAL NEEDLE ASPIRATION BIOPSY  06/13/2020   Procedure: BRONCHIAL NEEDLE ASPIRATION BIOPSIES;  Surgeon: Garner Nash, DO;  Location: [facility];  Service: Pulmonary;;   BRONCHIAL WASHINGS  06/13/2020   Procedure: BRONCHIAL WASHINGS;  Surgeon: Garner Nash, DO;  Location: [facility];  Service: Pulmonary;;   CARDIAC CATHETERIZATION  08/1995   50-55% second diag lesion, occluded OM   CARDIAC CATHETERIZATION  01/25/2018   results located in [place], BILATERAL     COLONOSCOPY     EYE SURGERY Left    lense implant removed/ blind in left eye   FIDUCIAL MARKER PLACEMENT  06/13/2020   Procedure: FIDUCIAL MARKER PLACEMENT;  Surgeon: Garner Nash, DO;  Location: [facility];  Service: Pulmonary;;   LAPAROTOMY N/A 04/26/2014   Procedure: EXPLORATORY LAPAROTOMY/RESECTION OF PELVIC MASS;  Surgeon: Alvino Chapel, MD;  Location: [facility];  Service: Gynecology;  Laterality: N/A;   OOPHORECTOMY     and fallopian tube   POLYPECTOMY     RETINAL DETACHMENT SURGERY  2006   TONSILLECTOMY AND ADENOIDECTOMY     TUBAL LIGATION     UPPER GASTROINTESTINAL ENDOSCOPY     treated for h-pylori   VIDEO BRONCHOSCOPY WITH ENDOBRONCHIAL NAVIGATION N/A 06/13/2020   Procedure: VIDEO BRONCHOSCOPY WITH ENDOBRONCHIAL NAVIGATION;  Surgeon: Garner Nash, DO;  Location: [facility];  Service: Pulmonary;  Laterality: N/A;   VIDEO BRONCHOSCOPY WITH ENDOBRONCHIAL ULTRASOUND  06/13/2020   Procedure: VIDEO  BRONCHOSCOPY WITH ENDOBRONCHIAL ULTRASOUND;  Surgeon: Garner Nash, DO;  Location: [facility];  Service: Pulmonary;;   Social History: She is widowed.  Retired.  She has 4 sons and 1 daughter.  She smokes cigarettes for the past 35+ years.  No alcohol use.  No drug use.  Family History: family history includes Aneurysm in her mother; Cancer in her maternal aunt and paternal grandmother; Diabetes in her sister; Heart attack in her father; Heart disease in her father; Hyperlipidemia in her mother and sister; Hypertension in her mother and sister.  Allergies  Allergen Reactions   Nabumetone Anaphylaxis, Swelling and Other (See Comments)    Causes inflammation of airways   Tape Other (See Comments)    SKIN IS VERY THIN AND TEARS EASILY- PLEASE USE AN ALTERNATIVE TO TAPE, IF POSSIBLE!!      Outpatient Encounter Medications as of 02/11/2022  Medication Sig   acetaminophen (TYLENOL) 325 MG tablet Take 325 mg by mouth every 6 (six) hours as needed (pain.).   allopurinol (ZYLOPRIM) 100 MG tablet Take 0.5 tablets (50 mg total) by mouth daily.   amLODipine (NORVASC) 5 MG tablet Take 1 tablet (5 mg total) by mouth daily.   atorvastatin (LIPITOR) 40 MG tablet Take 2 tablets (80 mg total) by mouth daily.   Cholecalciferol (VITAMIN D3) 50 MCG (2000 UT) TABS Take 2,000 Units by mouth daily.   clopidogrel (PLAVIX) 75 MG tablet Take 1 tablet (75 mg total) by mouth daily.   furosemide (LASIX) 20 MG tablet TAKE 1-2 TABLETS BY MOUTH  DAILY AS NEEDED FOR EDEMA.   isosorbide mononitrate (IMDUR) 30 MG 24 hr tablet Take 1 tablet (30 mg total) by mouth daily.   levothyroxine (SYNTHROID) 50 MCG tablet Take 1 tablet (50 mcg total) by mouth daily.   losartan (COZAAR) 25 MG tablet Take 1 tablet (25 mg total) by mouth daily.   metoprolol tartrate (LOPRESSOR) 25 MG tablet TAKE ONE-HALF TABLET BY  MOUTH TWICE DAILY   Multiple Vitamins-Minerals (PRESERVISION/LUTEIN)  CAPS Take 1 capsule by mouth 3 (three) times a  week. In the morning   Naproxen Sodium (ALEVE PO) Take 220 mg by mouth 2 (two) times daily as needed.   nitroGLYCERIN (NITROSTAT) 0.4 MG SL tablet Place 1 tablet (0.4 mg total) under the tongue every 5 (five) minutes x 3 doses as needed for chest pain.   tiZANidine (ZANAFLEX) 4 MG tablet Take 1 tablet (4 mg total) by mouth every 8 (eight) hours as needed for muscle spasms.   traMADol (ULTRAM) 50 MG tablet Take 0.5-1 tablets (25-50 mg total) by mouth every 8 (eight) hours as needed.   No facility-administered encounter medications on file as of 02/11/2022.    REVIEW OF SYSTEMS: Gen: + Weight loss. No fever, sweats or chills.  CV: Denies chest pain, palpitations or edema. Resp: Denies cough, shortness of breath of hemoptysis.  GI: See HPI. GU : Denies urinary burning, blood in urine, increased urinary frequency or incontinence. MS: Denies joint pain, muscles aches or weakness. Derm: Denies rash, itchiness, skin lesions or unhealing ulcers. Psych: + Depression.  Heme: Denies bruising, easy bleeding. Neuro:  Denies headaches, dizziness or paresthesias. Endo:  Denies any problems with DM, thyroid or adrenal function.  PHYSICAL EXAM: BP 120/68   Pulse 64   Ht 5\' 1"  (1.549 m)   Wt 109 lb 6.4 oz (49.6 kg)   LMP  (LMP Unknown)   SpO2 97%   BMI 20.67 kg/m  Wt Readings from Last 3 Encounters:  02/11/22 109 lb 6.4 oz (49.6 kg)  01/23/22 111 lb 8 oz (50.6 kg)  01/15/22 112 lb (50.8 kg)    General: Frail appearing 88 year old female in no acute distress. Head: Normocephalic and atraumatic. Eyes:  Sclerae non-icteric, conjunctive pink. Ears: Normal auditory acuity. Mouth: No ulcers or lesions.  Neck: Supple, no lymphadenopathy or thyromegaly.  Lungs: Clear bilaterally to auscultation without wheezes, crackles or rhonchi. Heart: Regular rate and rhythm. No murmur, rub or gallop appreciated.  Abdomen: Soft, distended.  Firm area to the LUQ area tender without rebound or guarding.  No  hepatosplenomegaly. Normoactive bowel sounds x 4 quadrants.  Enlarged aorta to the surrounding periumbilical region just pulsatile. Rectal: Deferred.  Musculoskeletal: Symmetrical with no gross deformities. Skin: Warm and dry. No rash or lesions on visible extremities. Extremities: No edema. Neurological: Alert oriented x 4, no focal deficits.  Psychological:  Alert and cooperative. Normal mood and affect.  ASSESSMENT AND PLAN:  50) 88 year old female with a transverse colon mass, concerning for primary colon cancer with carcinomatosis -Colonoscopy at [facility] (patient has severe AS) benefits and risks discussed including risk with sedation, risk of bleeding, perforation and infection. -Patient does not wish to pursue a diagnostic colonoscopy.  She intends to pursue palliative care management.  -Advised to contact her oncology team regarding wishes for palliative care -Soft diet as tolerated.  Encouraged Ensure or boost 1 to 2 cans daily. -2 stool softeners of choice daily as needed or MiraLAX nightly to keep stools soft to loose, easy to pass -Patient to present to the ED if she develops severe nausea/vomiting, abdominal distention, severe abdominal pain or no BM or gas per the rectum as she is at risk for colonic obstruction  2) Anemia, secondary to # 1  3) History of adenomatous colon polyps   4) History of lung cancer s/p radiation 07/2020.  She continues to smoke cigarettes.  5) Severe aortic stenosis   6) Coronary artery disease   7) 6.3 cm AAA  CC:  Plotnikov, Evie Lacks, MD

## 2022-02-11 NOTE — Patient Instructions (Addendum)
Take 2 stool softeners of choice once daily as needed to keep stool soft to loose, easy to pass. If needed may take Miralax one capful mixed in 8 ounces of water at bed time.  Follow up with your oncology team and proceed and palliative care to establish health care goals  Go the the emergency room if you develop severe abdominal pain, N/V, abdominal distension and no BM/gas per the rectum which are signs of a bowel obstruction    If you are age 86 or older, your body mass index should be between 23-30. Your Body mass index is 20.67 kg/m. If this is out of the aforementioned range listed, please consider follow up with your Primary Care Provider.  If you are age 55 or younger, your body mass index should be between 19-25. Your Body mass index is 20.67 kg/m. If this is out of the aformentioned range listed, please consider follow up with your Primary Care Provider.   ________________________________________________________  The [facility] providers would like to encourage you to use [facility] to communicate with providers for non-urgent requests or questions.  Due to long hold times on the telephone, sending your provider a message by [facility] may be a faster and more efficient way to get a response.  Please allow 48 business hours for a response.  Please remember that this is for non-urgent requests.  _______________________________________________________

## 2022-02-14 NOTE — Progress Notes (Signed)
I spoke with Tammy Brooks.  She had her consultation with GI and has decided against a colonoscopy and biopsy. Her cardiologist office has placed a referral for palliative care services.  I encouraged Tammy Wos to establish care with them.  All questions were answered.  She verbalized understanding.

## 2022-02-19 ENCOUNTER — Ambulatory Visit: Payer: Medicare Other | Admitting: Cardiovascular Disease

## 2022-02-20 ENCOUNTER — Ambulatory Visit: Payer: Medicare Other | Admitting: Nurse Practitioner

## 2022-02-20 ENCOUNTER — Ambulatory Visit (INDEPENDENT_AMBULATORY_CARE_PROVIDER_SITE_OTHER): Payer: Medicare Other | Admitting: Internal Medicine

## 2022-02-20 ENCOUNTER — Encounter: Payer: Self-pay | Admitting: Internal Medicine

## 2022-02-20 VITALS — BP 130/62 | HR 58 | Temp 97.7°F | Ht 61.0 in | Wt 110.8 lb

## 2022-02-20 DIAGNOSIS — R42 Dizziness and giddiness: Secondary | ICD-10-CM | POA: Insufficient documentation

## 2022-02-20 DIAGNOSIS — I7142 Juxtarenal abdominal aortic aneurysm, without rupture: Secondary | ICD-10-CM

## 2022-02-20 DIAGNOSIS — Z23 Encounter for immunization: Secondary | ICD-10-CM | POA: Diagnosis not present

## 2022-02-20 DIAGNOSIS — Z66 Do not resuscitate: Secondary | ICD-10-CM | POA: Diagnosis not present

## 2022-02-20 DIAGNOSIS — C3432 Malignant neoplasm of lower lobe, left bronchus or lung: Secondary | ICD-10-CM | POA: Diagnosis not present

## 2022-02-20 DIAGNOSIS — F17200 Nicotine dependence, unspecified, uncomplicated: Secondary | ICD-10-CM

## 2022-02-20 DIAGNOSIS — C3490 Malignant neoplasm of unspecified part of unspecified bronchus or lung: Secondary | ICD-10-CM

## 2022-02-20 DIAGNOSIS — K6389 Other specified diseases of intestine: Secondary | ICD-10-CM | POA: Diagnosis not present

## 2022-02-20 NOTE — Assessment & Plan Note (Signed)
Inoperable at this point. Palliative care discussed

## 2022-02-20 NOTE — Patient Instructions (Addendum)
Reduce Losartan, Amlodipine, Isosorbide to 1/2 tablet if dizzy

## 2022-02-20 NOTE — Progress Notes (Signed)
Subjective:  Patient ID: DANIYA ARAMBURO, female    DOB: 01-Sep-1933  Age: 88 y.o. MRN: [id number]  CC: Follow-up (6 month f/u- Flu shot)   HPI KATHELINE BRENDLINGER presents for COPD, CAD, probable new colon cancer, lung cancer. Here w/her son [name].  Per GI:  "Silvano Rusk. Wirkkala is an 88 year old female with a past medical history of arthritis, hypertension, hyperlipidemia, coronary artery disease s/p MI age 88, 6.3 cm abdominal aortic aneurysm, severe aortic stenosis, bilateral non-small cell lung cancer s/p radiation 07/2020, anemia and colon polyps.  She presents to our office today as referred by Dr. Nadeen Landau to schedule a colonoscopy due to having a distal transverse colon mass suspicious for colon cancer with probable carcinomatosis.   She underwent  a PET scan 06/12/2020 during her lung cancer work up which identified intense focal hypermetabolism associated with a mildly thick walled short segment of distal transverse colon in the left abdomen, malignancy could not be excluded.  She proceeded with radiation for her lung cancer 2/14 - 07/27/2020.  She underwent a surveillance CTA 11/15/2021 which identified a stable 6.2 cm AAA as well as a circumferential wall thickening and enhancement to the distal transverse colon with evidence concerning for focal enhancing soft tissue nodules in the mesentery and left upper quadrant adjacent to the splenic flexure largest up to 12 mm suspicious for carcinomatosis.  Chest CT without evidence of almanac metastasis.  She was seen by general surgeon Dr. Nadeen Landau who reviewed her case with the multidisciplinary tumor board and probability of surgery curing her was quite low in the setting of having peritoneal carcinomatosis therefore surgical intervention was not recommended.  She was also evaluated by vascular surgeon Dr. Stanford Breed and surgery for her large juxtarenal aortic aneurysm was not recommended as she is at high risk for mortality with any attempts at  repair.  She was evaluated by oncology 01/23/2022 he was supported palliative care and recommended a GI consult to consider a diagnostic colonoscopy.  She presents to our office today to discuss scheduling a colonoscopy to confirm her diagnosis.  She is accompanied by her son.  She has a history of 1 small tubular adenomatous polyp removed from the colon in 2012.  No known family history of colorectal cancer.  She has intermittent lower abdominal pain.  She has chronic constipation for which she takes 2 stool softeners as needed.  She typically passes 1 bowel movement once or twice weekly.  No rectal bleeding or black stools.  Her appetite has significantly decreased over the past 6 months.  She is lost 20 to 30 pounds over the past year.  She does not wish to pursue a diagnostic colonoscopy at this juncture.  She intends to proceed with palliative care."     "88 year old female with a transverse colon mass, concerning for primary colon cancer with carcinomatosis -Colonoscopy at [facility] (patient has severe AS) benefits and risks discussed including risk with sedation, risk of bleeding, perforation and infection. -Patient does not wish to pursue a diagnostic colonoscopy.  She intends to pursue palliative care management.  -Advised to contact her oncology team regarding wishes for palliative care -Soft diet as tolerated.  Encouraged Ensure or boost 1 to 2 cans daily. -2 stool softeners of choice daily as needed or MiraLAX nightly to keep stools soft to loose, easy to pass -Patient to present to the ED if she develops severe nausea/vomiting, abdominal distention, severe abdominal pain or no BM or  gas per the rectum as she is at risk for colonic obstruction   2) Anemia, secondary to # 1   3) History of adenomatous colon polyps    4) History of lung cancer s/p radiation 07/2020.  She continues to smoke cigarettes.   5) Severe aortic stenosis    6) Coronary artery disease    7) 6.3 cm AAA"   Outpatient  Medications Prior to Visit  Medication Sig Dispense Refill   acetaminophen (TYLENOL) 325 MG tablet Take 325 mg by mouth every 6 (six) hours as needed (pain.).     allopurinol (ZYLOPRIM) 100 MG tablet Take 0.5 tablets (50 mg total) by mouth daily. 45 tablet 3   amLODipine (NORVASC) 5 MG tablet Take 1 tablet (5 mg total) by mouth daily. 90 tablet 2   atorvastatin (LIPITOR) 40 MG tablet Take 2 tablets (80 mg total) by mouth daily. 180 tablet 2   Cholecalciferol (VITAMIN D3) 50 MCG (2000 UT) TABS Take 2,000 Units by mouth daily.     clopidogrel (PLAVIX) 75 MG tablet Take 1 tablet (75 mg total) by mouth daily. 90 tablet 2   ferrous sulfate 325 (65 FE) MG EC tablet Take by mouth.     furosemide (LASIX) 20 MG tablet TAKE 1-2 TABLETS BY MOUTH  DAILY AS NEEDED FOR EDEMA. 180 tablet 1   isosorbide mononitrate (IMDUR) 30 MG 24 hr tablet Take 1 tablet (30 mg total) by mouth daily. 90 tablet 3   levothyroxine (SYNTHROID) 50 MCG tablet TAKE 1 TABLET BY MOUTH  DAILY 90 tablet 3   losartan (COZAAR) 25 MG tablet Take 1 tablet (25 mg total) by mouth daily. 90 tablet 2   metoprolol tartrate (LOPRESSOR) 25 MG tablet TAKE ONE-HALF TABLET BY  MOUTH TWICE DAILY 90 tablet 1   Multiple Vitamins-Minerals (PRESERVISION/LUTEIN) CAPS Take 1 capsule by mouth 3 (three) times a week. In the morning     nitroGLYCERIN (NITROSTAT) 0.4 MG SL tablet Place 1 tablet (0.4 mg total) under the tongue every 5 (five) minutes x 3 doses as needed for chest pain. 20 tablet 3   tiZANidine (ZANAFLEX) 4 MG tablet Take 1 tablet (4 mg total) by mouth every 8 (eight) hours as needed for muscle spasms. 90 tablet 2   traMADol (ULTRAM) 50 MG tablet Take 0.5-1 tablets (25-50 mg total) by mouth every 8 (eight) hours as needed. 100 tablet 2   Naproxen Sodium (ALEVE PO) Take 220 mg by mouth 2 (two) times daily as needed.     No facility-administered medications prior to visit.    ROS: Review of Systems  Constitutional:  Positive for fatigue. Negative  for activity change, appetite change, chills and unexpected weight change.  HENT:  Negative for congestion, mouth sores and sinus pressure.   Eyes:  Positive for visual disturbance.  Respiratory:  Positive for shortness of breath. Negative for cough, chest tightness and wheezing.   Gastrointestinal:  Negative for abdominal pain, constipation, diarrhea, nausea and vomiting.  Endocrine: Negative for polydipsia and polyuria.  Genitourinary:  Negative for difficulty urinating, frequency and vaginal pain.  Musculoskeletal:  Positive for arthralgias, back pain and gait problem.  Skin:  Positive for color change. Negative for pallor and rash.  Neurological:  Negative for dizziness, tremors, weakness, numbness and headaches.  Hematological:  Negative for adenopathy. Bruises/bleeds easily.  Psychiatric/Behavioral:  Negative for confusion, decreased concentration, dysphoric mood, sleep disturbance and suicidal ideas. The patient is not nervous/anxious.     Objective:  BP 130/62 (BP Location: Left  Arm)   Pulse (!) 58   Temp 97.7 F (36.5 C) (Oral)   Ht 5\' 1"  (1.549 m)   Wt 110 lb 12.8 oz (50.3 kg)   LMP  (LMP Unknown)   SpO2 95%   BMI 20.94 kg/m   BP Readings from Last 3 Encounters:  02/20/22 130/62  02/11/22 120/68  01/23/22 130/61    Wt Readings from Last 3 Encounters:  02/20/22 110 lb 12.8 oz (50.3 kg)  02/11/22 109 lb 6.4 oz (49.6 kg)  01/23/22 111 lb 8 oz (50.6 kg)    Physical Exam Constitutional:      General: She is not in acute distress.    Appearance: Normal appearance. She is well-developed.  HENT:     Head: Normocephalic.     Right Ear: External ear normal.     Left Ear: External ear normal.     Nose: Nose normal.  Eyes:     General:        Right eye: No discharge.        Left eye: No discharge.     Conjunctiva/sclera: Conjunctivae normal.     Pupils: Pupils are equal, round, and reactive to light.  Neck:     Thyroid: No thyromegaly.     Vascular: No JVD.      Trachea: No tracheal deviation.  Cardiovascular:     Rate and Rhythm: Normal rate and regular rhythm.     Heart sounds: Normal heart sounds.  Pulmonary:     Effort: No respiratory distress.     Breath sounds: No stridor. No wheezing.  Abdominal:     General: Bowel sounds are normal. There is no distension.     Palpations: Abdomen is soft. There is no mass.     Tenderness: There is no abdominal tenderness. There is no guarding or rebound.  Musculoskeletal:        General: No tenderness.     Cervical back: Normal range of motion and neck supple. No rigidity.  Lymphadenopathy:     Cervical: No cervical adenopathy.  Skin:    Findings: No erythema or rash.  Neurological:     Mental Status: She is oriented to person, place, and time.     Cranial Nerves: No cranial nerve deficit.     Motor: No abnormal muscle tone.     Coordination: Coordination normal.     Deep Tendon Reflexes: Reflexes normal.  Psychiatric:        Behavior: Behavior normal.        Thought Content: Thought content normal.        Judgment: Judgment normal.   AKs on skin  Lab Results  Component Value Date   WBC 8.1 01/24/2022   HGB 10.9 (L) 01/24/2022   HCT 36.5 01/24/2022   PLT 256 01/24/2022   GLUCOSE 106 (H) 01/24/2022   CHOL 116 02/15/2021   TRIG 91.0 02/15/2021   HDL 32.20 (L) 02/15/2021   LDLDIRECT 76.7 11/17/2008   LDLCALC 65 02/15/2021   ALT 11 01/24/2022   AST 18 01/24/2022   NA 143 01/24/2022   K 4.1 01/24/2022   CL 110 01/24/2022   CREATININE 1.10 (H) 01/24/2022   BUN 16 01/24/2022   CO2 27 01/24/2022   TSH 4.12 02/15/2021    CT Angio Abd/Pel w/ and/or w/o  Result Date: 11/16/2021 CLINICAL DATA:  88 year old female with aortic aneurysm EXAM: CTA ABDOMEN AND PELVIS WITHOUT AND WITH CONTRAST TECHNIQUE: Multidetector CT imaging of the abdomen and pelvis was performed using  the standard protocol during bolus administration of intravenous contrast. Multiplanar reconstructed images and MIPs were  obtained and reviewed to evaluate the vascular anatomy. RADIATION DOSE REDUCTION: This exam was performed according to the departmental dose-optimization program which includes automated exposure control, adjustment of the mA and/or kV according to patient size and/or use of iterative reconstruction technique. CONTRAST:  45mL ISOVUE-370 IOPAMIDOL (ISOVUE-370) INJECTION 76% COMPARISON:  04/24/2020, 06/03/2021 FINDINGS: VASCULAR Aorta: Advanced atherosclerotic calcification of the lower thoracic and the abdominal aorta. Redemonstration of juxtarenal abdominal aortic aneurysm. The greatest diameter on the most recent comparison of 06/03/2021 was on the parasagittal reformatted images, approximately 6.2 cm. In a similar location on the parasagittal images, the current diameter is 6.2 cm. No change in configuration. No periaortic fluid or inflammatory changes. Throughout most of the aneurysm sac, there is at least 60-75% circumferential thrombus formation, with the flow channel relatively maintained. Celiac: Celiac artery remains patent with minimal atherosclerotic changes at the origin. SMA: SMA remains patent with mild atherosclerotic changes at the origin. Renals: - Right: Right renal artery patent, with moderate atherosclerotic changes at the origin, with perhaps 50% narrowing at the origin on the coronal images. - Left: Left renal artery patent, with moderate atherosclerotic changes at the origin, perhaps 50% narrowing, best seen on coronal images. IMA: IMA occluded at the origin. Right lower extremity: The right common iliac artery appears patent, given the maintained flow channel from the aorta into the iliac system, however, there is likely a high-grade stenosis at the right CIA origin secondary to calcified plaque. Atherosclerotic changes present throughout the right CIA, into the hypogastric artery, and the proximal external iliac artery. Greatest diameter of the right CIA approximately 15 mm. No aneurysm of  the external iliac artery. Posterior wall calcifications of the common femoral artery, which appears patent. Proximal profunda femoris and SFA patent. Left lower extremity: Left common iliac artery patent. Greatest diameter measures 19 mm on the axial images. Atherosclerotic changes present throughout the left CIA, into the hypogastric artery, and the external iliac artery. No aneurysm of the left EIA. Posterior wall calcifications of the left common femoral artery. Proximal profunda femoris and SFA patent. Veins: Unremarkable appearance of the venous system. Review of the MIP images confirms the above findings. NON-VASCULAR Lower chest: Emphysematous changes of the lower chest. Scarring at the left lung base again noted. Similar appearance of pericardial fluid/thickening. No acute findings. Hepatobiliary: Unremarkable appearance of the liver. Unremarkable gall bladder. Pancreas: Unremarkable. Spleen: Unremarkable. Adrenals/Urinary Tract: - Right adrenal gland: Unremarkable - Left adrenal gland: Unremarkable. - Right kidney: No hydronephrosis, nephrolithiasis, inflammation, or ureteral dilation. No focal lesion. - Left Kidney: No hydronephrosis, nephrolithiasis, inflammation, or ureteral dilation. Cystic lesion at the inferior left kidney, 28 mm, with no internal complexity or enhancement. Hounsfield units estimated 6, fluid density. - Urinary Bladder: Urinary bladder relatively decompressed. Stomach/Bowel: - Stomach: Unremarkable. - Small bowel: Unremarkable - Appendix: Appendix is not visualized, however, no inflammatory changes are present adjacent to the cecum to indicate an appendicitis. - Colon: Circumferential wall thickening and enhancement of the distal transverse colon, which was described on prior PET-CT and prior comparison abdominal CT. Additionally there are focal enhancing soft tissue nodules of the colonic mesentery, as well as within the left upper quadrant adjacent to the splenic flexure largest  measures 12 mm. No evidence of obstruction. Moderate formed stool burden. Lymphatic: No adenopathy. Mesenteric: No free air or free fluid within the abdomen. Reproductive: Hysterectomy Other: Similar appearance of left femoral hernia containing  mostly fat as well as short segment of small bowel. Musculoskeletal: Multilevel degenerative changes of the spine. No acute displaced fracture. No bony canal narrowing. No suspicious sclerotic or lucent lesions. Degenerative changes of the hips. IMPRESSION: Adenocarcinoma of the transverse colon, with evidence of local peritoneal metastases. Follow-up with oncology is indicated. Essentially unchanged size and configuration of juxtarenal abdominal aortic aneurysm, with the greatest estimated dimension 6.2 cm, unchanged from January 2023. Aortic aneurysm NOS (ICD10-I71.9). High-grade stenosis of the right CIA origin is suspected given the degree of calcifications and late arrival of the contrast bolus. Associated diffuse atherosclerotic changes of the bilateral iliac system, with right CIA greatest estimated diameter being 15 mm, and estimated greatest diameter of the left CIA 19 mm. Aortic atherosclerosis and associated mesenteric and renal artery disease. The degree of ostial stenosis of the bilateral renal arteries is estimated 50%. Aortic Atherosclerosis (ICD10-I70.0). Unchanged pericardial effusion. Additional ancillary findings as above. Signed, Dulcy Fanny. Nadene Rubins, RPVI [facility] Electronically Signed   By: Corrie Mckusick D.O.   On: 11/16/2021 10:11       A total time of 45 minutes was spent preparing to see the patient, reviewing tests, x-rays, operative reports and other medical records.  Also, obtaining history and performing comprehensive physical exam.  Additionally, counseling the patient regarding the above listed issues.   Finally, documenting clinical information in the health records,  coordination of care, educating the patient - and her son [name]: NO CPR, palliative care, AAA. It is a complex case.  Assessment & Plan:   Problem List Items Addressed This Visit     AAA (abdominal aortic aneurysm) without rupture ([place])    Inoperable at this point. Palliative care discussed      Colonic mass    01/2022 transverse colon mass - a distal transverse colon mass suspicious for colon cancer with probable carcinomatosis. She does not wish to pursue a diagnostic colonoscopy at this juncture.  She intends to proceed with palliative care.  Inoperable at this point. Palliative care discussed      Do not intubate, perform CPR, or defibrillate    Discussed and confirmed NO CPR      Non-small cell lung cancer [facility])    S/p XRT Palliative care Discussed and confirmed NO CPR      Primary cancer of left lower lobe of lung ([place])    Palliative care discussed      Tobacco use disorder    Smoker      Other Visit Diagnoses     Needs flu shot    -  Primary   Relevant Orders   Flu Vaccine QUAD High Dose(Fluad) (Completed)         No orders of the defined types were placed in this encounter.     Follow-up: Return in about 2 months (around 04/22/2022) for a follow-up visit.  Walker Kehr, MD

## 2022-02-20 NOTE — Assessment & Plan Note (Signed)
Palliative care discussed

## 2022-02-20 NOTE — Assessment & Plan Note (Signed)
Smoker

## 2022-02-20 NOTE — Assessment & Plan Note (Signed)
Discussed and confirmed NO CPR

## 2022-02-20 NOTE — Assessment & Plan Note (Signed)
Reduce Losartan, Amlodipine, Isosorbide to 1/2 tablet if dizzy

## 2022-02-20 NOTE — Assessment & Plan Note (Signed)
S/p XRT Palliative care Discussed and confirmed NO CPR

## 2022-02-20 NOTE — Assessment & Plan Note (Signed)
01/2022 transverse colon mass - a distal transverse colon mass suspicious for colon cancer with probable carcinomatosis. She does not wish to pursue a diagnostic colonoscopy at this juncture.  She intends to proceed with palliative care.  Inoperable at this point. Palliative care discussed

## 2022-02-21 ENCOUNTER — Ambulatory Visit: Payer: Medicare Other | Admitting: Nurse Practitioner

## 2022-02-23 ENCOUNTER — Other Ambulatory Visit: Payer: Self-pay | Admitting: Internal Medicine

## 2022-02-25 ENCOUNTER — Telehealth: Payer: Self-pay | Admitting: *Deleted

## 2022-02-25 NOTE — Telephone Encounter (Signed)
Called patient to inform of CT for 03-15-22- arrival time- 2 pm @ [facility], no restrictions to test, patient to receive results from Dr. Sondra Come on 03-18-22 @ 11 am, lvm for a return call

## 2022-03-15 ENCOUNTER — Ambulatory Visit (HOSPITAL_COMMUNITY)
Admission: RE | Admit: 2022-03-15 | Discharge: 2022-03-15 | Disposition: A | Discharge status: Home / Self Care | Payer: Medicare Other | Source: Ambulatory Visit | Attending: Radiation Oncology | Admitting: Radiation Oncology

## 2022-03-15 DIAGNOSIS — C3432 Malignant neoplasm of lower lobe, left bronchus or lung: Secondary | ICD-10-CM | POA: Diagnosis not present

## 2022-03-15 DIAGNOSIS — R911 Solitary pulmonary nodule: Secondary | ICD-10-CM | POA: Diagnosis not present

## 2022-03-15 DIAGNOSIS — J439 Emphysema, unspecified: Secondary | ICD-10-CM | POA: Diagnosis not present

## 2022-03-17 NOTE — Progress Notes (Signed)
Radiation Oncology         [phone] ________________________________  Name: Tammy Brooks MRN: [id number]  Date: 03/18/2022  DOB: 1933-11-11  Follow-Up Visit Note  CC: Plotnikov, Evie Lacks, MD  Plotnikov, Evie Lacks, MD  No diagnosis found.  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 Patient has opted to proceed with palliative measures   Interval Since Last Radiation: 1 year, 7 months, and 22 days   Radiation Treatment Dates: 07/17/2020 - 07/27/2020   Site : Left lung Technique : SBRT/SRT-VMAT Total Dose (Gy) : 50 Gy in 5 fractions (10 Gy/fx) Dose per Fx (Gy) : 10 Total Fx : 5/5   Site: Right lung Technique: SBRT Total Dose (Gy) : 54 GY in 3 Fx's  Narrative:  The patient returns today for routine follow-up and to review recent imaging. She was last seen here for follow-up on 09/13/21.   Since her last visit, the patient underwent a CTA for surveillance of a known aortic aneurysm on 11/15/21 which demonstrated a mass of the distal transverse colon with evidence of at least "local" peritoneal carcinomatosis. To review, evidence of the mass was seen on her PET scan from 06/12/21 which showed an intense focal hypermetabolism in the distal transverse colon, and was suggested to have colonoscopy.  Subsequently, the patient was referred to Dr. Horris [facility], on 01/09/22 for further evaluation. During this visit, the patient denied any abdominal pain, bloody stools, significant abdominal bloating, nausea, or vomiting. Following discussion, Dr. Dema Severin recommended urgent referrals to gastroenterology and medical oncology. Her case was also submitted for review at the multidisciplinary tumor board to discuss next steps.Given her age and AAA, Dr. Dema Severin did no recommend surgical intervention.   Accordingly, the patient met with Cira Rue NP, medical  oncology, on 01/23/22 to discuss treatment options. During this visit, the patient reported low appetite and approximately 30 pounds weight loss over the course of 6-12 months. She also endorsed fatigue. In terms of treatment options, Kalman Shan NP recommended proceeding with colonoscopy for tissue diagnosis and molecular testing to determine if she is a candidate for immunotherapy or targeted therapy. In the event that biopsies confirm adenocarcinoma, the role of palliative chemotherapy, such as single agent Xeloda was briefly discussed. (Though again, given her age and co-morbidities, chemotherapy would be challenging for her).  The role of palliative radiation to the primary tumor was also discussed if colon cancer is confirmed and no other suitable treatment option are applicable. Proceeding with palliative measures / hospice was also reviewed.    Following presentation at the multidisciplinary tumor board on 01/30/22, it was felt strongly that the patient does have peritoneal carcinomatosis.   Following discussion with gastroenterology on 02/11/22, the patient does not wish to undergo biopsies and has opted to pursue palliative care management.      Her most recent chest CT on 03/15/22 showed ***.                           Allergies:  is allergic to nabumetone and tape.  Meds: Current Outpatient Medications  Medication Sig Dispense Refill   acetaminophen (TYLENOL) 325 MG tablet Take 325 mg by mouth every 6 (six) hours as needed (pain.).     allopurinol (ZYLOPRIM) 100 MG tablet TAKE ONE-HALF TABLET BY  MOUTH DAILY 45 tablet 3   amLODipine (NORVASC) 5 MG tablet  Take 1 tablet (5 mg total) by mouth daily. 90 tablet 2   atorvastatin (LIPITOR) 40 MG tablet Take 2 tablets (80 mg total) by mouth daily. 180 tablet 2   Cholecalciferol (VITAMIN D3) 50 MCG (2000 UT) TABS Take 2,000 Units by mouth daily.     clopidogrel (PLAVIX) 75 MG tablet Take 1 tablet (75 mg total) by mouth daily. 90 tablet 2   ferrous  sulfate 325 (65 FE) MG EC tablet Take by mouth.     furosemide (LASIX) 20 MG tablet TAKE 1-2 TABLETS BY MOUTH  DAILY AS NEEDED FOR EDEMA. 180 tablet 1   isosorbide mononitrate (IMDUR) 30 MG 24 hr tablet Take 1 tablet (30 mg total) by mouth daily. 90 tablet 3   levothyroxine (SYNTHROID) 50 MCG tablet TAKE 1 TABLET BY MOUTH  DAILY 90 tablet 3   losartan (COZAAR) 25 MG tablet Take 1 tablet (25 mg total) by mouth daily. 90 tablet 2   metoprolol tartrate (LOPRESSOR) 25 MG tablet TAKE ONE-HALF TABLET BY  MOUTH TWICE DAILY 90 tablet 1   Multiple Vitamins-Minerals (PRESERVISION/LUTEIN) CAPS Take 1 capsule by mouth 3 (three) times a week. In the morning     nitroGLYCERIN (NITROSTAT) 0.4 MG SL tablet Place 1 tablet (0.4 mg total) under the tongue every 5 (five) minutes x 3 doses as needed for chest pain. 20 tablet 3   tiZANidine (ZANAFLEX) 4 MG tablet Take 1 tablet (4 mg total) by mouth every 8 (eight) hours as needed for muscle spasms. 90 tablet 2   traMADol (ULTRAM) 50 MG tablet Take 0.5-1 tablets (25-50 mg total) by mouth every 8 (eight) hours as needed. 100 tablet 2   No current facility-administered medications for this encounter.    Physical Findings: The patient is in no acute distress. Patient is alert and oriented.  vitals were not taken for this visit. .  No significant changes. Lungs are clear to auscultation bilaterally. Heart has regular rate and rhythm. No palpable cervical, supraclavicular, or axillary adenopathy. Abdomen soft, non-tender, normal bowel sounds.   Lab Findings: Lab Results  Component Value Date   WBC 8.1 01/24/2022   HGB 10.9 (L) 01/24/2022   HCT 36.5 01/24/2022   MCV 85.7 01/24/2022   PLT 256 01/24/2022    Radiographic Findings: No results found.  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 Patient has opted to proceed with palliative  measures   The patient is recovering from the effects of radiation.  ***  Plan:  ***   *** minutes of total time was spent for this patient encounter, including preparation, face-to-face counseling with the patient and coordination of care, physical exam, and documentation of the encounter. ____________________________________  Blair Promise, PhD, MD  This document serves as a record of services personally performed by Gery Pray, MD. It was created on his behalf by Roney Mans, a trained medical scribe. The creation of this record is based on the scribe's personal observations and the provider's statements to them. This document has been checked and approved by the attending provider.

## 2022-03-18 ENCOUNTER — Ambulatory Visit
Admission: RE | Admit: 2022-03-18 | Discharge: 2022-03-18 | Disposition: A | Discharge status: Home / Self Care | Payer: Medicare Other | Source: Ambulatory Visit | Attending: Radiation Oncology | Admitting: Radiation Oncology

## 2022-03-18 ENCOUNTER — Encounter: Payer: Self-pay | Admitting: Radiation Oncology

## 2022-03-18 VITALS — BP 132/63 | HR 66 | Temp 97.1°F | Resp 18 | Ht 61.0 in | Wt 107.6 lb

## 2022-03-18 DIAGNOSIS — Z7902 Long term (current) use of antithrombotics/antiplatelets: Secondary | ICD-10-CM | POA: Insufficient documentation

## 2022-03-18 DIAGNOSIS — I700 Atherosclerosis of aorta: Secondary | ICD-10-CM | POA: Diagnosis not present

## 2022-03-18 DIAGNOSIS — C786 Secondary malignant neoplasm of retroperitoneum and peritoneum: Secondary | ICD-10-CM | POA: Insufficient documentation

## 2022-03-18 DIAGNOSIS — I7143 Infrarenal abdominal aortic aneurysm, without rupture: Secondary | ICD-10-CM | POA: Insufficient documentation

## 2022-03-18 DIAGNOSIS — Z79890 Hormone replacement therapy (postmenopausal): Secondary | ICD-10-CM | POA: Diagnosis not present

## 2022-03-18 DIAGNOSIS — C3432 Malignant neoplasm of lower lobe, left bronchus or lung: Secondary | ICD-10-CM | POA: Insufficient documentation

## 2022-03-18 DIAGNOSIS — Z79899 Other long term (current) drug therapy: Secondary | ICD-10-CM | POA: Diagnosis not present

## 2022-03-18 DIAGNOSIS — I714 Abdominal aortic aneurysm, without rupture, unspecified: Secondary | ICD-10-CM | POA: Diagnosis not present

## 2022-03-18 DIAGNOSIS — I2510 Atherosclerotic heart disease of native coronary artery without angina pectoris: Secondary | ICD-10-CM | POA: Diagnosis not present

## 2022-03-18 DIAGNOSIS — Z85118 Personal history of other malignant neoplasm of bronchus and lung: Secondary | ICD-10-CM | POA: Insufficient documentation

## 2022-03-18 DIAGNOSIS — F17210 Nicotine dependence, cigarettes, uncomplicated: Secondary | ICD-10-CM | POA: Diagnosis not present

## 2022-03-18 DIAGNOSIS — C3490 Malignant neoplasm of unspecified part of unspecified bronchus or lung: Secondary | ICD-10-CM

## 2022-03-18 DIAGNOSIS — I3481 Nonrheumatic mitral (valve) annulus calcification: Secondary | ICD-10-CM | POA: Diagnosis not present

## 2022-03-18 DIAGNOSIS — C3411 Malignant neoplasm of upper lobe, right bronchus or lung: Secondary | ICD-10-CM | POA: Diagnosis not present

## 2022-03-18 DIAGNOSIS — Z923 Personal history of irradiation: Secondary | ICD-10-CM | POA: Insufficient documentation

## 2022-03-18 NOTE — Progress Notes (Signed)
Tammy Brooks is here today for follow up post radiation to the lung.  Lung Side: Right and left lungs,patient completed treatment on 07/27/20.  Does the patient complain of any of the following: Pain:No Shortness of breath w/wo exertion: Yes, noted patient to be short of breath when speaking.  Cough: No Hemoptysis: No Pain with swallowing: No Swallowing/choking concerns: No Appetite: Poor,, Patient reports drinking 1 ensure daily.  Energy Level: Poor.  Post radiation skin Changes: No    Additional comments if applicable: Patient reports having CT performed in June that came back with Colon cancer. Patient states she does not want to undergo any treatment at this time due to cancer being so close to abdominal aneurysm.   BP 132/63 (BP Location: Right Arm, Patient Position: Sitting, Cuff Size: Normal)   Pulse 66   Temp (!) 97.1 F (36.2 C)   Resp 18   Ht 5\' 1"  (1.549 m)   Wt 107 lb 9.6 oz (48.8 kg)   LMP  (LMP Unknown)   SpO2 100%   BMI 20.33 kg/m

## 2022-03-21 ENCOUNTER — Other Ambulatory Visit: Payer: Medicare Other

## 2022-03-21 ENCOUNTER — Encounter: Payer: Medicare Other | Admitting: Genetic Counselor

## 2022-04-24 ENCOUNTER — Ambulatory Visit: Payer: Medicare Other | Admitting: Internal Medicine

## 2022-04-29 NOTE — Progress Notes (Signed)
Per Dr Ernestina Penna request I spoke with Tammy Brooks and let her know her genetic testing was negative.  I explained that meant her cancers are not genetic and will not be passed along to her children.  She states she is doing ok.  She states she still has her chronic back pain but does not have any other symptoms.  I told her we are here for her if she should start to experience cancer related symptoms we can help her manage those.  All questions were answered.  She verbalized understanding.

## 2022-06-12 ENCOUNTER — Encounter: Payer: Self-pay | Admitting: Internal Medicine

## 2022-06-12 ENCOUNTER — Ambulatory Visit (INDEPENDENT_AMBULATORY_CARE_PROVIDER_SITE_OTHER): Payer: Medicare Other | Admitting: Internal Medicine

## 2022-06-12 VITALS — BP 122/70 | HR 63 | Temp 97.5°F | Ht 61.0 in | Wt 105.2 lb

## 2022-06-12 DIAGNOSIS — R634 Abnormal weight loss: Secondary | ICD-10-CM | POA: Diagnosis not present

## 2022-06-12 DIAGNOSIS — M1990 Unspecified osteoarthritis, unspecified site: Secondary | ICD-10-CM

## 2022-06-12 DIAGNOSIS — C3411 Malignant neoplasm of upper lobe, right bronchus or lung: Secondary | ICD-10-CM

## 2022-06-12 DIAGNOSIS — H338 Other retinal detachments: Secondary | ICD-10-CM

## 2022-06-12 DIAGNOSIS — I10 Essential (primary) hypertension: Secondary | ICD-10-CM | POA: Diagnosis not present

## 2022-06-12 DIAGNOSIS — I7142 Juxtarenal abdominal aortic aneurysm, without rupture: Secondary | ICD-10-CM | POA: Diagnosis not present

## 2022-06-12 DIAGNOSIS — C184 Malignant neoplasm of transverse colon: Secondary | ICD-10-CM | POA: Diagnosis not present

## 2022-06-12 DIAGNOSIS — C189 Malignant neoplasm of colon, unspecified: Secondary | ICD-10-CM | POA: Insufficient documentation

## 2022-06-12 LAB — CBC WITH DIFFERENTIAL/PLATELET
Basophils Absolute: 0.1 10*3/uL (ref 0.0–0.1)
Basophils Relative: 0.5 % (ref 0.0–3.0)
Eosinophils Absolute: 0.1 10*3/uL (ref 0.0–0.7)
Eosinophils Relative: 1 % (ref 0.0–5.0)
HCT: 33.2 % — ABNORMAL LOW (ref 36.0–46.0)
Hemoglobin: 10.8 g/dL — ABNORMAL LOW (ref 12.0–15.0)
Lymphocytes Relative: 20.8 % (ref 12.0–46.0)
Lymphs Abs: 2.2 10*3/uL (ref 0.7–4.0)
MCHC: 32.4 g/dL (ref 30.0–36.0)
MCV: 77.5 fl — ABNORMAL LOW (ref 78.0–100.0)
Monocytes Absolute: 0.9 10*3/uL (ref 0.1–1.0)
Monocytes Relative: 8.3 % (ref 3.0–12.0)
Neutro Abs: 7.3 10*3/uL (ref 1.4–7.7)
Neutrophils Relative %: 69.4 % (ref 43.0–77.0)
Platelets: 263 10*3/uL (ref 150.0–400.0)
RBC: 4.28 Mil/uL (ref 3.87–5.11)
RDW: 15.7 % — ABNORMAL HIGH (ref 11.5–15.5)
WBC: 10.5 10*3/uL (ref 4.0–10.5)

## 2022-06-12 LAB — COMPREHENSIVE METABOLIC PANEL
ALT: 11 U/L (ref 0–35)
AST: 18 U/L (ref 0–37)
Albumin: 3.8 g/dL (ref 3.5–5.2)
Alkaline Phosphatase: 65 U/L (ref 39–117)
BUN: 18 mg/dL (ref 6–23)
CO2: 25 mEq/L (ref 19–32)
Calcium: 9.2 mg/dL (ref 8.4–10.5)
Chloride: 106 mEq/L (ref 96–112)
Creatinine, Ser: 1.09 mg/dL (ref 0.40–1.20)
GFR: 45.94 mL/min — ABNORMAL LOW (ref >60.00)
Glucose, Bld: 93 mg/dL (ref 70–99)
Potassium: 4.4 mEq/L (ref 3.5–5.1)
Sodium: 143 mEq/L (ref 135–145)
Total Bilirubin: 0.4 mg/dL (ref 0.2–1.2)
Total Protein: 6.3 g/dL (ref 6.0–8.3)

## 2022-06-12 LAB — T4, FREE: Free T4: 1.02 ng/dL (ref 0.60–1.60)

## 2022-06-12 LAB — TSH: TSH: 9.35 u[IU]/mL — ABNORMAL HIGH (ref 0.35–5.50)

## 2022-06-12 MED ORDER — AMLODIPINE BESYLATE 5 MG PO TABS: 5.0000 mg | ORAL_TABLET | Freq: Every day | ORAL | 2 refills | Status: DC

## 2022-06-12 MED ORDER — ATORVASTATIN CALCIUM 40 MG PO TABS: 80.0000 mg | ORAL_TABLET | Freq: Every day | ORAL | 2 refills | Status: DC

## 2022-06-12 MED ORDER — ALLOPURINOL 100 MG PO TABS: 50.0000 mg | ORAL_TABLET | Freq: Every day | ORAL | 3 refills | Status: DC

## 2022-06-12 MED ORDER — LEVOTHYROXINE SODIUM 50 MCG PO TABS: 50.0000 ug | ORAL_TABLET | Freq: Every day | ORAL | 3 refills | Status: DC

## 2022-06-12 MED ORDER — CLOPIDOGREL BISULFATE 75 MG PO TABS: 75.0000 mg | ORAL_TABLET | Freq: Every day | ORAL | 2 refills | Status: DC

## 2022-06-12 MED ORDER — METOPROLOL TARTRATE 25 MG PO TABS: 12.5000 mg | ORAL_TABLET | Freq: Two times a day (BID) | ORAL | 1 refills | Status: DC

## 2022-06-12 MED ORDER — LOSARTAN POTASSIUM 25 MG PO TABS: 25.0000 mg | ORAL_TABLET | Freq: Every day | ORAL | 2 refills | Status: DC

## 2022-06-12 MED ORDER — ISOSORBIDE MONONITRATE ER 30 MG PO TB24: 30.0000 mg | ORAL_TABLET | Freq: Every day | ORAL | 3 refills | Status: DC

## 2022-06-12 NOTE — Assessment & Plan Note (Signed)
F/u on Amlodipine, Losartan, Toprol

## 2022-06-12 NOTE — Assessment & Plan Note (Signed)
Almost blind

## 2022-06-12 NOTE — Progress Notes (Signed)
Subjective:  Patient ID: Tammy Brooks, female    DOB: 15-Dec-1933  Age: 89 y.o. MRN: [id number]  CC: No chief complaint on file.   HPI Tammy Brooks presents for COPD, CAD, HTN, cancer (lung, colon) Here w/her son [name]. Smokes 1/2 ppd.   Outpatient Medications Prior to Visit  Medication Sig Dispense Refill   acetaminophen (TYLENOL) 325 MG tablet Take 325 mg by mouth every 6 (six) hours as needed (pain.).     Cholecalciferol (VITAMIN D3) 50 MCG (2000 UT) TABS Take 2,000 Units by mouth daily.     ferrous sulfate 325 (65 FE) MG EC tablet Take by mouth.     furosemide (LASIX) 20 MG tablet TAKE 1-2 TABLETS BY MOUTH  DAILY AS NEEDED FOR EDEMA. 180 tablet 1   Multiple Vitamins-Minerals (PRESERVISION/LUTEIN) CAPS Take 1 capsule by mouth 3 (three) times a week. In the morning     nitroGLYCERIN (NITROSTAT) 0.4 MG SL tablet Place 1 tablet (0.4 mg total) under the tongue every 5 (five) minutes x 3 doses as needed for chest pain. 20 tablet 3   traMADol (ULTRAM) 50 MG tablet Take 0.5-1 tablets (25-50 mg total) by mouth every 8 (eight) hours as needed. 100 tablet 2   allopurinol (ZYLOPRIM) 100 MG tablet TAKE ONE-HALF TABLET BY  MOUTH DAILY 45 tablet 3   amLODipine (NORVASC) 5 MG tablet Take 1 tablet (5 mg total) by mouth daily. 90 tablet 2   atorvastatin (LIPITOR) 40 MG tablet Take 2 tablets (80 mg total) by mouth daily. 180 tablet 2   clopidogrel (PLAVIX) 75 MG tablet Take 1 tablet (75 mg total) by mouth daily. 90 tablet 2   isosorbide mononitrate (IMDUR) 30 MG 24 hr tablet Take 1 tablet (30 mg total) by mouth daily. 90 tablet 3   levothyroxine (SYNTHROID) 50 MCG tablet TAKE 1 TABLET BY MOUTH  DAILY 90 tablet 3   losartan (COZAAR) 25 MG tablet Take 1 tablet (25 mg total) by mouth daily. 90 tablet 2   metoprolol tartrate (LOPRESSOR) 25 MG tablet TAKE ONE-HALF TABLET BY  MOUTH TWICE DAILY 90 tablet 1   tiZANidine (ZANAFLEX) 4 MG tablet Take 1 tablet (4 mg total) by mouth every 8 (eight) hours as needed  for muscle spasms. 90 tablet 2   No facility-administered medications prior to visit.    ROS: Review of Systems  Constitutional:  Positive for fatigue. Negative for activity change, appetite change, chills and unexpected weight change.  HENT:  Negative for congestion, mouth sores and sinus pressure.   Eyes:  Positive for visual disturbance.  Respiratory:  Negative for cough and chest tightness.   Gastrointestinal:  Negative for abdominal pain and nausea.  Genitourinary:  Positive for frequency and urgency. Negative for difficulty urinating and vaginal pain.  Musculoskeletal:  Negative for back pain and gait problem.  Skin:  Negative for pallor and rash.  Neurological:  Negative for dizziness, tremors, weakness, numbness and headaches.  Psychiatric/Behavioral:  Negative for confusion, sleep disturbance and suicidal ideas.     Objective:  BP 122/70 (BP Location: Left Arm, Patient Position: Sitting, Cuff Size: Normal)   Pulse 63   Temp (!) 97.5 F (36.4 C) (Oral)   Ht 5\' 1"  (1.549 m)   Wt 105 lb 3.2 oz (47.7 kg)   LMP  (LMP Unknown)   SpO2 96%   BMI 19.88 kg/m   BP Readings from Last 3 Encounters:  06/12/22 122/70  03/18/22 132/63  02/20/22 130/62    Wt Readings  from Last 3 Encounters:  06/12/22 105 lb 3.2 oz (47.7 kg)  03/18/22 107 lb 9.6 oz (48.8 kg)  02/20/22 110 lb 12.8 oz (50.3 kg)    Physical Exam Constitutional:      General: She is not in acute distress.    Appearance: Normal appearance. She is well-developed.  HENT:     Head: Normocephalic.     Right Ear: External ear normal.     Left Ear: External ear normal.     Nose: Nose normal.  Eyes:     General:        Right eye: No discharge.        Left eye: No discharge.     Conjunctiva/sclera: Conjunctivae normal.  Neck:     Thyroid: No thyromegaly.     Vascular: No JVD.     Trachea: No tracheal deviation.  Cardiovascular:     Rate and Rhythm: Normal rate and regular rhythm.     Heart sounds: Murmur  heard.  Pulmonary:     Effort: No respiratory distress.     Breath sounds: No stridor. No wheezing or rales.  Abdominal:     General: Bowel sounds are normal. There is no distension.     Palpations: Abdomen is soft. There is mass.     Tenderness: There is no abdominal tenderness. There is no guarding or rebound.  Musculoskeletal:        General: Tenderness present.     Cervical back: Normal range of motion and neck supple. No rigidity.  Lymphadenopathy:     Cervical: No cervical adenopathy.  Skin:    Findings: No erythema or rash.  Neurological:     Cranial Nerves: No cranial nerve deficit.     Motor: No abnormal muscle tone.     Coordination: Coordination normal.     Deep Tendon Reflexes: Reflexes normal.  Psychiatric:        Behavior: Behavior normal.        Thought Content: Thought content normal.        Judgment: Judgment normal.   AKs on face Visually impaired Transverse colon mass - NT    A total time of 45 minutes was spent preparing to see the patient, reviewing tests, x-rays, operative reports and other medical records.  Also, obtaining history and performing comprehensive physical exam.  Additionally, counseling the patient regarding the above listed issues AA, colon cancer, lung cancer.   Finally, documenting clinical information in the health records, coordination of care, educating the patient. It is a complex case.   Lab Results  Component Value Date   WBC 8.1 01/24/2022   HGB 10.9 (L) 01/24/2022   HCT 36.5 01/24/2022   PLT 256 01/24/2022   GLUCOSE 106 (H) 01/24/2022   CHOL 116 02/15/2021   TRIG 91.0 02/15/2021   HDL 32.20 (L) 02/15/2021   LDLDIRECT 76.7 11/17/2008   LDLCALC 65 02/15/2021   ALT 11 01/24/2022   AST 18 01/24/2022   NA 143 01/24/2022   K 4.1 01/24/2022   CL 110 01/24/2022   CREATININE 1.10 (H) 01/24/2022   BUN 16 01/24/2022   CO2 27 01/24/2022   TSH 4.12 02/15/2021    No results found.  Assessment & Plan:   Problem List Items  Addressed This Visit       Cardiovascular and Mediastinum   Essential hypertension    F/u on Amlodipine, Losartan, Toprol      Relevant Medications   amLODipine (NORVASC) 5 MG tablet   atorvastatin (LIPITOR)  40 MG tablet   isosorbide mononitrate (IMDUR) 30 MG 24 hr tablet   losartan (COZAAR) 25 MG tablet   metoprolol tartrate (LOPRESSOR) 25 MG tablet   AAA (abdominal aortic aneurysm) without rupture (HCC)    Inoperable at this point.       Relevant Medications   amLODipine (NORVASC) 5 MG tablet   atorvastatin (LIPITOR) 40 MG tablet   isosorbide mononitrate (IMDUR) 30 MG 24 hr tablet   losartan (COZAAR) 25 MG tablet   metoprolol tartrate (LOPRESSOR) 25 MG tablet     Respiratory   Primary cancer of right upper lobe of lung ([place]) - Primary    Patient had navigational bronchoscopy to both right and left lung.  Diagnosed lung nodules as non-small cell lung cancer.  Both stage I disease.   Patient has been under SBRT treatments at present. No significant cough or shortness of breath. XRT smoker - 1/2 ppd      Relevant Medications   allopurinol (ZYLOPRIM) 100 MG tablet   Other Relevant Orders   CEA     Digestive   Colon cancer [facility])    Summer 2023 CT: Adenocarcinoma of the transverse colon, with evidence of local peritoneal metastases. 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     Dispense:  45 tablet    Refill:  3    .   amLODipine (NORVASC) 5 MG tablet    Sig: Take 1 tablet (5 mg total) by mouth daily.    Dispense:  90 tablet    Refill:  2   atorvastatin (LIPITOR) 40 MG tablet    Sig: Take 2 tablets (80 mg total) by mouth daily.    Dispense:  180 tablet    Refill:  2   clopidogrel (PLAVIX) 75 MG tablet    Sig: Take 1 tablet (75 mg total) by mouth daily.    Dispense:  90 tablet    Refill:  2    Requesting 1 year supply   isosorbide mononitrate (IMDUR) 30 MG 24 hr tablet    Sig: Take 1 tablet (30 mg total) by mouth daily.    Dispense:  90 tablet    Refill:  3    Requesting 1 year supply   levothyroxine (SYNTHROID) 50 MCG tablet    Sig: Take 1 tablet (50 mcg total) by mouth daily.    Dispense:  90 tablet    Refill:  3   losartan (COZAAR) 25 MG tablet    Sig: Take 1 tablet (25 mg total) by mouth daily.    Dispense:  90 tablet    Refill:  2    Requesting 1 year supply   metoprolol tartrate (  LOPRESSOR) 25 MG tablet    Sig: Take 0.5 tablets (12.5 mg total) by mouth 2 (two) times daily.    Dispense:  90 tablet    Refill:  1      Follow-up: Return in about 6 months (around 12/11/2022) for a follow-up visit.  Walker Kehr, MD

## 2022-06-12 NOTE — Assessment & Plan Note (Signed)
Patient had navigational bronchoscopy to both right and left lung.  Diagnosed lung nodules as non-small cell lung cancer.  Both stage I disease.   Patient has been under SBRT treatments at present. No significant cough or shortness of breath. XRT smoker - 1/2 ppd

## 2022-06-12 NOTE — Assessment & Plan Note (Addendum)
Summer 2023 CT: Adenocarcinoma of the transverse colon, with evidence of local peritoneal metastases. Palliative care

## 2022-06-12 NOTE — Assessment & Plan Note (Signed)
Tylenol prn 

## 2022-06-12 NOTE — Assessment & Plan Note (Signed)
Inoperable at this point.

## 2022-06-13 LAB — CEA: CEA: <2 ng/mL

## 2022-07-03 ENCOUNTER — Encounter: Payer: Self-pay | Admitting: Cardiovascular Disease

## 2022-07-03 ENCOUNTER — Ambulatory Visit: Payer: Medicare Other | Attending: Cardiovascular Disease | Admitting: Cardiovascular Disease

## 2022-07-03 VITALS — BP 142/62 | HR 68 | Ht 61.0 in | Wt 106.0 lb

## 2022-07-03 DIAGNOSIS — I10 Essential (primary) hypertension: Secondary | ICD-10-CM | POA: Diagnosis not present

## 2022-07-03 DIAGNOSIS — E785 Hyperlipidemia, unspecified: Secondary | ICD-10-CM | POA: Diagnosis not present

## 2022-07-03 DIAGNOSIS — I350 Nonrheumatic aortic (valve) stenosis: Secondary | ICD-10-CM | POA: Diagnosis not present

## 2022-07-03 DIAGNOSIS — I739 Peripheral vascular disease, unspecified: Secondary | ICD-10-CM

## 2022-07-03 NOTE — Assessment & Plan Note (Signed)
History of peripheral arterial disease with a 5.5 cm infrarenal abdominal aortic aneurysm not amenable to endoluminal stent grafting because of anatomy/geometry followed by Dr. Stanford Breed.  He was decided not to pursue surgical revascularization because of her age and comorbidities.

## 2022-07-03 NOTE — Patient Instructions (Signed)
Medication Instructions:  Your physician recommends that you continue on your current medications as directed. Please refer to the Current Medication list given to you today.  *If you need a refill on your cardiac medications before your next appointment, please call your pharmacy*   Testing/Procedures: Your physician has requested that you have an echocardiogram. Echocardiography is a painless test that uses sound waves to create images of your heart. It provides your doctor with information about the size and shape of your heart and how well your heart's chambers and valves are working. This procedure takes approximately one hour. There are no restrictions for this procedure. Please do NOT wear cologne, perfume, aftershave, or lotions (deodorant is allowed). Please arrive 15 minutes prior to your appointment time. This procedure will be done at [address]    Follow-Up: At [facility], you and your health needs are our priority.  As part of our continuing mission to provide you with exceptional heart care, we have created designated Provider Care Teams.  These Care Teams include your primary Cardiologist (physician) and Advanced Practice Providers (APPs -  Physician Assistants and Nurse Practitioners) who all work together to provide you with the care you need, when you need it.  We recommend signing up for the patient portal called "MyChart".  Sign up information is provided on this After Visit Summary.  MyChart is used to connect with patients for Virtual Visits (Telemedicine).  Patients are able to view lab/test results, encounter notes, upcoming appointments, etc.  Non-urgent messages can be sent to your provider as well.   To learn more about what you can do with MyChart, go to [email or web address].    Your next appointment:   12 month(s)  Provider:   Jonathan Berry, MD    

## 2022-07-03 NOTE — Assessment & Plan Note (Signed)
History of essential pressure measured today at 142/62.  She is on amlodipine, losartan and metoprolol.

## 2022-07-03 NOTE — Progress Notes (Signed)
07/03/2022 Tammy Brooks   03-15-1934  [id number]  Primary Physician Plotnikov, Evie Lacks, MD Primary Cardiologist: Lorretta Harp MD Lupe Carney, [place]  HPI:  Tammy Brooks is a 89 y.o.  mildly overweight widowed Caucasian female mother of 62, grandmother of 75 grandchildren .I last saw her in the office 02/28/2021.  She has a history of mild CAD by cath in 1997. Her lipid problems include continued tobacco abuse one pack per day, hypertension, and hyperlipidemia all medically treated. She does have mild to moderate aortic stenosis with valve area of 1 cm2 by 2-D echo one year ago.. She also has moderate right internal carotid artery stenosis by duplex ultrasound. She was recently diagnosed with a small abdominal aortic and iliac aneurysm followed by Dr. Donnetta Hutching. She denies chest pain or shortness of breath. She was found to have a pelvic mass which was surgically excised last year and found to be benign. She does have a hernia as result of this. She has been seen in the emergency room once for chest pain and rule out PE.  CTA was negative for this although it did show coronary calcification consistent with atherosclerosis.  She was scheduled for a Myoview stress test which has been delayed until September because of fiscal constraints.  She does continue to smoke a pack a day.  She currently denies chest pain or shortness of breath.   Since I saw her 3 months ago a follow-up echo shown stable moderate aortic stenosis with normal LV function.  She saw Kerin Ransom in the office 02/11/2018 who began her on indoor which it has resulted in marked improvement in her chest pain.  She also has a moderate abdominal aortic aneurysm followed by Dr. Donnetta Hutching measuring 5 cm.   Since I saw her in the office a year ago she continues to remain stable.  Her peak aortic gradient has increased by 2D echo with a valve area 0.54 cm.  Her abdominal aortic aneurysm now measures 5.5 cm followed by Dr. Standley Dakins.  Apparently  this is not amenable to endoluminal stent grafting because of geometry and selected not to pursue surgical revascularization.  She apparently was recently found to have a spiculated lung nodule suspicious for cancer for which she has received radiation therapy.  She also was recently diagnosed with colon cancer..  She does continue to smoke.  She denies chest pain or shortness of breath.   Current Meds  Medication Sig   acetaminophen (TYLENOL) 325 MG tablet Take 325 mg by mouth every 6 (six) hours as needed (pain.).   allopurinol (ZYLOPRIM) 100 MG tablet Take 0.5 tablets (50 mg total) by mouth daily.   amLODipine (NORVASC) 5 MG tablet Take 1 tablet (5 mg total) by mouth daily.   atorvastatin (LIPITOR) 40 MG tablet Take 2 tablets (80 mg total) by mouth daily.   Cholecalciferol (VITAMIN D3) 50 MCG (2000 UT) TABS Take 2,000 Units by mouth daily.   clopidogrel (PLAVIX) 75 MG tablet Take 1 tablet (75 mg total) by mouth daily.   ferrous sulfate 325 (65 FE) MG EC tablet Take by mouth.   furosemide (LASIX) 20 MG tablet TAKE 1-2 TABLETS BY MOUTH  DAILY AS NEEDED FOR EDEMA.   isosorbide mononitrate (IMDUR) 30 MG 24 hr tablet Take 1 tablet (30 mg total) by mouth daily.   levothyroxine (SYNTHROID) 50 MCG tablet Take 1 tablet (50 mcg total) by mouth daily.   losartan (COZAAR) 25 MG tablet Take 1 tablet (  25 mg total) by mouth daily.   metoprolol tartrate (LOPRESSOR) 25 MG tablet Take 0.5 tablets (12.5 mg total) by mouth 2 (two) times daily.   Multiple Vitamins-Minerals (PRESERVISION/LUTEIN) CAPS Take 1 capsule by mouth 3 (three) times a week. In the morning   nitroGLYCERIN (NITROSTAT) 0.4 MG SL tablet Place 1 tablet (0.4 mg total) under the tongue every 5 (five) minutes x 3 doses as needed for chest pain.   traMADol (ULTRAM) 50 MG tablet Take 0.5-1 tablets (25-50 mg total) by mouth every 8 (eight) hours as needed.     Allergies  Allergen Reactions   Nabumetone Anaphylaxis, Swelling and Other (See Comments)     Causes inflammation of airways   Tape Other (See Comments)    SKIN IS VERY THIN AND TEARS EASILY- PLEASE USE AN ALTERNATIVE TO TAPE, IF POSSIBLE!!    Social History   Socioeconomic History   Marital status: Widowed    Spouse name: Not on file   Number of children: 5   Years of education: 9   Highest education level: 9th grade  Occupational History   Occupation: retired    Comment: retired  Tobacco Use   Smoking status: Every Day    Packs/day: 0.50    Years: 70.00    Total pack years: 35.00    Types: Cigarettes   Smokeless tobacco: Never  Vaping Use   Vaping Use: Never used  Substance and Sexual Activity   Alcohol use: Not Currently    Comment: occasionally - few drinks/month but none in 1 year   Drug use: Never   Sexual activity: Not Currently    Partners: Male    Birth control/protection: Surgical    Comment: Hysterectomy  Other Topics Concern   Not on file  Social History Narrative   9th grade. married 09/07/52- widowed Dec 10th'08. 4 sons - '55, '58, '60, '69 (two of these have passes away); 1 daughter - Sep 07, 2061 (deceased); 12 grandchildren; 26 great-grandchildren. 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 Other children are close and attentive. ACP/End-of-life: No CPR, no mechanical ventilation, no futile/heroic measures.   Social Determinants of Health   Financial Resource Strain: Low Risk  (08/06/2021)   Overall Financial Resource Strain (CARDIA)    Difficulty of Paying Living Expenses: Not hard at all  Food Insecurity: No Food Insecurity (08/06/2021)   Hunger Vital Sign    Worried About Running Out of Food in the Last Year: Never true    Ran Out of Food in the Last Year: Never true  Transportation Needs: No Transportation Needs (08/06/2021)   PRAPARE - Hydrologist (Medical): No    Lack of Transportation (Non-Medical): No  Physical Activity: Inactive (08/06/2021)   Exercise Vital Sign    Days of  Exercise per Week: 0 days    Minutes of Exercise per Session: 0 min  Stress: No Stress Concern Present (08/06/2021)   [place]    Feeling of Stress : Only a little  Social Connections: Socially Isolated (08/06/2021)   Social Connection and Isolation Panel [NHANES]    Frequency of Communication with Friends and Family: Twice a week    Frequency of Social Gatherings with Friends and Family: Twice a week    Attends Religious Services: Never    Marine scientist or Organizations: No    Attends Archivist Meetings: Never    Marital  Status: Widowed  Intimate Partner Violence: Not At Risk (08/06/2021)   Humiliation, Afraid, Rape, and Kick questionnaire    Fear of Current or Ex-Partner: No    Emotionally Abused: No    Physically Abused: No    Sexually Abused: No     Review of Systems: General: negative for chills, fever, night sweats or weight changes.  Cardiovascular: negative for chest pain, dyspnea on exertion, edema, orthopnea, palpitations, paroxysmal nocturnal dyspnea or shortness of breath Dermatological: negative for rash Respiratory: negative for cough or wheezing Urologic: negative for hematuria Abdominal: negative for nausea, vomiting, diarrhea, bright red blood per rectum, melena, or hematemesis Neurologic: negative for visual changes, syncope, or dizziness All other systems reviewed and are otherwise negative except as noted above.    Blood pressure (!) 142/62, pulse 68, height 5\' 1"  (1.549 m), weight 106 lb (48.1 kg), SpO2 96 %.  General appearance: alert and no distress Neck: no adenopathy, no JVD, supple, symmetrical, trachea midline, thyroid not enlarged, symmetric, no tenderness/mass/nodules, and bilateral carotid bruits versus transmitted murmur Lungs: clear to auscultation bilaterally Heart: 3/6 high-pitched outflow tract murmur consistent with severe aortic stenosis. Extremities:  extremities normal, atraumatic, no cyanosis or edema Pulses: 2+ and symmetric Skin: Skin color, texture, turgor normal. No rashes or lesions Neurologic: Grossly normal  EKG sinus rhythm at 68 with septal Q waves and nonspecific ST and T wave changes.  I personally reviewed this EKG.  ASSESSMENT AND PLAN:   Dyslipidemia History of dyslipidemia on statin therapy lipid profile performed 02/15/2021 revealing total cholesterol 116, LDL 65 HDL 32.  Essential hypertension History of essential pressure measured today at 142/62.  She is on amlodipine, losartan and metoprolol.  Peripheral vascular disease ([place]) History of peripheral arterial disease with a 5.5 cm infrarenal abdominal aortic aneurysm not amenable to endoluminal stent grafting because of anatomy/geometry followed by Dr. Stanford Breed.  He was decided not to pursue surgical revascularization because of her age and comorbidities.  Severe aortic stenosis Severe aortic stenosis by 2D echo performed 06/03/2021 with normal LV systolic function.  Her aortic valve area at that time was 0.57 cm.  She really denies chest pain or changes in her breathing pattern.  Will recheck a 2D echocardiogram.     Lorretta Harp MD [facility], [facility] 07/03/2022 11:55 AM

## 2022-07-03 NOTE — Assessment & Plan Note (Signed)
Severe aortic stenosis by 2D echo performed 06/03/2021 with normal LV systolic function.  Her aortic valve area at that time was 0.57 cm.  She really denies chest pain or changes in her breathing pattern.  Will recheck a 2D echocardiogram.

## 2022-07-03 NOTE — Assessment & Plan Note (Signed)
History of dyslipidemia on statin therapy lipid profile performed 02/15/2021 revealing total cholesterol 116, LDL 65 HDL 32.

## 2022-07-22 ENCOUNTER — Telehealth: Payer: Self-pay

## 2022-07-22 NOTE — Telephone Encounter (Signed)
Called patient to schedule Medicare Annual Wellness Visit (AWV). Left message for patient to call back and schedule Medicare Annual Wellness Visit (AWV).  Last date of AWV: 08/06/21  Please schedule an appointment at any time with [facility].  If any questions, please contact me at [phone].  Thank you ,  P.J. Quentin Cornwall, CMA (AAMA)

## 2022-07-23 ENCOUNTER — Telehealth: Payer: Self-pay

## 2022-07-23 NOTE — Telephone Encounter (Signed)
Contacted Tammy Brooks to schedule their annual wellness visit. Appointment made for 08/12/22.  (Patient returned my call)  Norton Blizzard, [place] ([place])  [facility] [phone]

## 2022-07-29 ENCOUNTER — Ambulatory Visit (HOSPITAL_COMMUNITY): Payer: Medicare Other | Attending: Cardiovascular Disease

## 2022-07-29 DIAGNOSIS — E785 Hyperlipidemia, unspecified: Secondary | ICD-10-CM | POA: Diagnosis not present

## 2022-07-29 DIAGNOSIS — I739 Peripheral vascular disease, unspecified: Secondary | ICD-10-CM | POA: Insufficient documentation

## 2022-07-29 DIAGNOSIS — I350 Nonrheumatic aortic (valve) stenosis: Secondary | ICD-10-CM | POA: Insufficient documentation

## 2022-07-29 DIAGNOSIS — I10 Essential (primary) hypertension: Secondary | ICD-10-CM | POA: Diagnosis not present

## 2022-07-29 LAB — ECHOCARDIOGRAM COMPLETE
AR max vel: 0.6 cm2
AV Area VTI: 0.57 cm2
AV Area mean vel: 0.56 cm2
AV Mean grad: 40 mmHg
AV Peak grad: 63.8 mmHg
Ao pk vel: 4 m/s
Area-P 1/2: 1.69 cm2
P 1/2 time: 599 msec
S' Lateral: 3.5 cm

## 2022-08-01 DIAGNOSIS — H338 Other retinal detachments: Secondary | ICD-10-CM | POA: Diagnosis not present

## 2022-08-01 DIAGNOSIS — H353113 Nonexudative age-related macular degeneration, right eye, advanced atrophic without subfoveal involvement: Secondary | ICD-10-CM | POA: Diagnosis not present

## 2022-08-01 DIAGNOSIS — Z961 Presence of intraocular lens: Secondary | ICD-10-CM | POA: Diagnosis not present

## 2022-08-12 ENCOUNTER — Ambulatory Visit (INDEPENDENT_AMBULATORY_CARE_PROVIDER_SITE_OTHER): Payer: Medicare Other

## 2022-08-12 VITALS — Ht 61.0 in | Wt 105.0 lb

## 2022-08-12 DIAGNOSIS — Z0000 Encounter for general adult medical examination without abnormal findings: Secondary | ICD-10-CM | POA: Diagnosis not present

## 2022-08-12 NOTE — Patient Instructions (Addendum)
Ms. Tammy Brooks , Thank you for taking time to come for your Medicare Wellness Visit. I appreciate your ongoing commitment to your health goals. Please review the following plan we discussed and let me know if I can assist you in the future.   These are the goals we discussed:  Goals      Client understands the importance of follow-up with providers by attending scheduled visits        This is a list of the screening recommended for you and due dates:  Health Maintenance  Topic Date Due   COVID-19 Vaccine (4 - 2023-24 season) 02/01/2022   Zoster (Shingles) Vaccine (1 of 2) 09/11/2022*   Medicare Annual Wellness Visit  08/12/2023   DTaP/Tdap/Td vaccine (3 - Td or Tdap) 12/31/2027   Pneumonia Vaccine  Completed   Flu Shot  Completed   DEXA scan (bone density measurement)  Completed   HPV Vaccine  Aged Out  *Topic was postponed. The date shown is not the original due date.    Advanced directives: Yes; documents on file.  Conditions/risks identified: Yes  Next appointment: Follow up in one year for your annual wellness visit.   Preventive Care 87 Years and Older, Female Preventive care refers to lifestyle choices and visits with your health care provider that can promote health and wellness. What does preventive care include? A yearly physical exam. This is also called an annual well check. Dental exams once or twice a year. Routine eye exams. Ask your health care provider how often you should have your eyes checked. Personal lifestyle choices, including: Daily care of your teeth and gums. Regular physical activity. Eating a healthy diet. Avoiding tobacco and drug use. Limiting alcohol use. Practicing safe sex. Taking low-dose aspirin every day. Taking vitamin and mineral supplements as recommended by your health care provider. What happens during an annual well check? The services and screenings done by your health care provider during your annual well check will depend on your  age, overall health, lifestyle risk factors, and family history of disease. Counseling  Your health care provider may ask you questions about your: Alcohol use. Tobacco use. Drug use. Emotional well-being. Home and relationship well-being. Sexual activity. Eating habits. History of falls. Memory and ability to understand (cognition). Work and work Statistician. Reproductive health. Screening  You may have the following tests or measurements: Height, weight, and BMI. Blood pressure. Lipid and cholesterol levels. These may be checked every 5 years, or more frequently if you are over 34 years old. Skin check. Lung cancer screening. You may have this screening every year starting at age 87 if you have a 30-pack-year history of smoking and currently smoke or have quit within the past 15 years. Fecal occult blood test (FOBT) of the stool. You may have this test every year starting at age 87. Flexible sigmoidoscopy or colonoscopy. You may have a sigmoidoscopy every 5 years or a colonoscopy every 10 years starting at age 87. Hepatitis C blood test. Hepatitis B blood test. Sexually transmitted disease (STD) testing. Diabetes screening. This is done by checking your blood sugar (glucose) after you have not eaten for a while (fasting). You may have this done every 1-3 years. Bone density scan. This is done to screen for osteoporosis. You may have this done starting at age 87. Mammogram. This may be done every 1-2 years. Talk to your health care provider about how often you should have regular mammograms. Talk with your health care provider about your test results,  treatment options, and if necessary, the need for more tests. Vaccines  Your health care provider may recommend certain vaccines, such as: Influenza vaccine. This is recommended every year. Tetanus, diphtheria, and acellular pertussis (Tdap, Td) vaccine. You may need a Td booster every 10 years. Zoster vaccine. You may need this after  age 87. Pneumococcal 13-valent conjugate (PCV13) vaccine. One dose is recommended after age 87. Pneumococcal polysaccharide (PPSV23) vaccine. One dose is recommended after age 87. Talk to your health care provider about which screenings and vaccines you need and how often you need them. This information is not intended to replace advice given to you by your health care provider. Make sure you discuss any questions you have with your health care provider. Document Released: 06/16/2015 Document Revised: 02/07/2016 Document Reviewed: 03/21/2015 Elsevier Interactive Patient Education  2017 [place] Prevention in the Home Falls can cause injuries. They can happen to people of all ages. There are many things you can do to make your home safe and to help prevent falls. What can I do on the outside of my home? Regularly fix the edges of walkways and driveways and fix any cracks. Remove anything that might make you trip as you walk through a door, such as a raised step or threshold. Trim any bushes or trees on the path to your home. Use bright outdoor lighting. Clear any walking paths of anything that might make someone trip, such as rocks or tools. Regularly check to see if handrails are loose or broken. Make sure that both sides of any steps have handrails. Any raised decks and porches should have guardrails on the edges. Have any leaves, snow, or ice cleared regularly. Use sand or salt on walking paths during winter. Clean up any spills in your garage right away. This includes oil or grease spills. What can I do in the bathroom? Use night lights. Install grab bars by the toilet and in the tub and shower. Do not use towel bars as grab bars. Use non-skid mats or decals in the tub or shower. If you need to sit down in the shower, use a plastic, non-slip stool. Keep the floor dry. Clean up any water that spills on the floor as soon as it happens. Remove soap buildup in the tub or shower  regularly. Attach bath mats securely with double-sided non-slip rug tape. Do not have throw rugs and other things on the floor that can make you trip. What can I do in the bedroom? Use night lights. Make sure that you have a light by your bed that is easy to reach. Do not use any sheets or blankets that are too big for your bed. They should not hang down onto the floor. Have a firm chair that has side arms. You can use this for support while you get dressed. Do not have throw rugs and other things on the floor that can make you trip. What can I do in the kitchen? Clean up any spills right away. Avoid walking on wet floors. Keep items that you use a lot in easy-to-reach places. If you need to reach something above you, use a strong step stool that has a grab bar. Keep electrical cords out of the way. Do not use floor polish or wax that makes floors slippery. If you must use wax, use non-skid floor wax. Do not have throw rugs and other things on the floor that can make you trip. What can I do with my stairs? Do  not leave any items on the stairs. Make sure that there are handrails on both sides of the stairs and use them. Fix handrails that are broken or loose. Make sure that handrails are as long as the stairways. Check any carpeting to make sure that it is firmly attached to the stairs. Fix any carpet that is loose or worn. Avoid having throw rugs at the top or bottom of the stairs. If you do have throw rugs, attach them to the floor with carpet tape. Make sure that you have a light switch at the top of the stairs and the bottom of the stairs. If you do not have them, ask someone to add them for you. What else can I do to help prevent falls? Wear shoes that: Do not have high heels. Have rubber bottoms. Are comfortable and fit you well. Are closed at the toe. Do not wear sandals. If you use a stepladder: Make sure that it is fully opened. Do not climb a closed stepladder. Make sure that  both sides of the stepladder are locked into place. Ask someone to hold it for you, if possible. Clearly mark and make sure that you can see: Any grab bars or handrails. First and last steps. Where the edge of each step is. Use tools that help you move around (mobility aids) if they are needed. These include: Canes. Walkers. Scooters. Crutches. Turn on the lights when you go into a dark area. Replace any light bulbs as soon as they burn out. Set up your furniture so you have a clear path. Avoid moving your furniture around. If any of your floors are uneven, fix them. If there are any pets around you, be aware of where they are. Review your medicines with your doctor. Some medicines can make you feel dizzy. This can increase your chance of falling. Ask your doctor what other things that you can do to help prevent falls. This information is not intended to replace advice given to you by your health care provider. Make sure you discuss any questions you have with your health care provider. Document Released: 03/16/2009 Document Revised: 10/26/2015 Document Reviewed: 06/24/2014 Elsevier Interactive Patient Education  2017 [employer].

## 2022-08-12 NOTE — Progress Notes (Addendum)
I connected with  WINNELL CALDERARO on 08/12/22 by a audio enabled telemedicine application and verified that I am speaking with the correct person using two identifiers.  Patient Location: Home  Provider Location: Office/Clinic  I discussed the limitations of evaluation and management by telemedicine. The patient expressed understanding and agreed to proceed.  Subjective:   Tammy Brooks is a 89 y.o. female who presents for Medicare Annual (Subsequent) preventive examination.  Review of Systems     Cardiac Risk Factors include: advanced age (>60mn, >>79women);dyslipidemia;hypertension;sedentary lifestyle;smoking/ tobacco exposure;family history of premature cardiovascular disease     Objective:    Today's Vitals   08/12/22 1051  Weight: 105 lb (47.6 kg)  Height: '5\' 1"'$  (1.549 m)  PainSc: 0-No pain   Body mass index is 19.84 kg/m.     08/12/2022   11:02 AM 03/18/2022   10:56 AM 09/13/2021   11:37 AM 08/06/2021   11:30 AM 03/12/2021   11:44 AM 08/31/2020    9:54 AM 08/03/2020    9:53 AM  Advanced Directives  Does Patient Have a Medical Advance Directive? Yes Yes Yes Yes Yes No Yes  Type of AParamedicof [place]ing[place] will  [place] will [place]Living will [place]Living will  Living will;Healthcare Power of Attorney  Does patient want to make changes to medical advance directive? No - Patient declined No - Guardian declined No - Patient declined  No - Patient declined  No - Patient declined  Copy of HTrappein Chart? Yes - validated most recent copy scanned in chart (See row information)  Yes - validated most recent copy scanned in chart (See row information) No - copy requested Yes - validated most recent copy scanned in chart (See row information)  No - copy requested  Would patient like information on creating a medical advance directive?      No - Patient declined      Current Medications (verified) Outpatient Encounter Medications as of 08/12/2022  Medication Sig   acetaminophen (TYLENOL) 325 MG tablet Take 325 mg by mouth every 6 (six) hours as needed (pain.).   allopurinol (ZYLOPRIM) 100 MG tablet Take 0.5 tablets (50 mg total) by mouth daily.   amLODipine (NORVASC) 5 MG tablet Take 1 tablet (5 mg total) by mouth daily.   atorvastatin (LIPITOR) 40 MG tablet Take 2 tablets (80 mg total) by mouth daily.   Cholecalciferol (VITAMIN D3) 50 MCG (2000 UT) TABS Take 2,000 Units by mouth daily.   clopidogrel (PLAVIX) 75 MG tablet Take 1 tablet (75 mg total) by mouth daily.   ferrous sulfate 325 (65 FE) MG EC tablet Take by mouth.   furosemide (LASIX) 20 MG tablet TAKE 1-2 TABLETS BY MOUTH  DAILY AS NEEDED FOR EDEMA.   isosorbide mononitrate (IMDUR) 30 MG 24 hr tablet Take 1 tablet (30 mg total) by mouth daily.   levothyroxine (SYNTHROID) 50 MCG tablet Take 1 tablet (50 mcg total) by mouth daily.   losartan (COZAAR) 25 MG tablet Take 1 tablet (25 mg total) by mouth daily.   metoprolol tartrate (LOPRESSOR) 25 MG tablet Take 0.5 tablets (12.5 mg total) by mouth 2 (two) times daily.   Multiple Vitamins-Minerals (PRESERVISION/LUTEIN) CAPS Take 1 capsule by mouth 3 (three) times a week. In the morning   nitroGLYCERIN (NITROSTAT) 0.4 MG SL tablet Place 1 tablet (0.4 mg total) under the tongue every 5 (five) minutes x 3 doses as needed  for chest pain.   traMADol (ULTRAM) 50 MG tablet Take 0.5-1 tablets (25-50 mg total) by mouth every 8 (eight) hours as needed.   No facility-administered encounter medications on file as of 08/12/2022.    Allergies (verified) Nabumetone and Tape   History: Past Medical History:  Diagnosis Date   Abdominal aortic aneurysm ([place])    followed by Dr. Sherren Mocha Early   Abnormal CT scan 2023   Arthritis    hands   Blind left eye    CAD (coronary artery disease) 08/1995   50-55% second diag dz.; occluded OM   Cataract    Complication  of anesthesia    "Blood pressure went down" with retinal surgery - no problems with previous surgeries   Echocardiogram abnormal 03/2012   mild to mod AS, valve area1.58 cm2, mild concentric LVH, grade I diastolic dusfunction   Gout    Heart murmur    History of radiation therapy 07/17/2020-07/27/2020   SBRT to right and left lung    Dr Gery Pray   Hyperlipidemia    Hypertension    Hypothyroidism    Lung cancer ([place])    Lung cancer ([place]) 2022   right and left lung   MI (myocardial infarction) ([place]) 01/25/2018   Myocardial infarction [facility])    mild/ age 72   nscl ca 07/2020   Osteoporosis    PAD (peripheral artery disease) ([place])    mild to mod Rt ICA stenosis followed by doppler-02/05/12   Pelvic mass    Stroke ([place]) 2001   no residual problems   Thyroid disease    Tobacco abuse    Ulcer    Past Surgical History:  Procedure Laterality Date   ABDOMINAL HYSTERECTOMY     APPENDECTOMY     BRONCHIAL BIOPSY  06/13/2020   Procedure: BRONCHIAL BIOPSIES;  Surgeon: Garner Nash, DO;  Location: [place] [facility];  Service: Pulmonary;;   BRONCHIAL BRUSHINGS  06/13/2020   Procedure: BRONCHIAL BRUSHINGS;  Surgeon: Garner Nash, DO;  Location: [place] [facility];  Service: Pulmonary;;   BRONCHIAL NEEDLE ASPIRATION BIOPSY  06/13/2020   Procedure: BRONCHIAL NEEDLE ASPIRATION BIOPSIES;  Surgeon: Garner Nash, DO;  Location: [place] [facility];  Service: Pulmonary;;   BRONCHIAL WASHINGS  06/13/2020   Procedure: BRONCHIAL WASHINGS;  Surgeon: Garner Nash, DO;  Location: [place] [facility];  Service: Pulmonary;;   CARDIAC CATHETERIZATION  08/1995   50-55% second diag lesion, occluded OM   CARDIAC CATHETERIZATION  01/25/2018   results located in CE   CATARACT EXTRACTION W/ INTRAOCULAR LENS  IMPLANT, BILATERAL     COLONOSCOPY     EYE SURGERY Left    lense implant removed/ blind in left eye   FIDUCIAL MARKER PLACEMENT  06/13/2020   Procedure: FIDUCIAL MARKER PLACEMENT;  Surgeon: Garner Nash, DO;   Location: [place] [facility];  Service: Pulmonary;;   LAPAROTOMY N/A 04/26/2014   Procedure: EXPLORATORY LAPAROTOMY/RESECTION OF PELVIC MASS;  Surgeon: Alvino Chapel, MD;  Location: [facility];  Service: Gynecology;  Laterality: N/A;   OOPHORECTOMY     and fallopian tube   POLYPECTOMY     RETINAL DETACHMENT SURGERY  2006   TONSILLECTOMY AND ADENOIDECTOMY     TUBAL LIGATION     UPPER GASTROINTESTINAL ENDOSCOPY     treated for h-pylori   VIDEO BRONCHOSCOPY WITH ENDOBRONCHIAL NAVIGATION N/A 06/13/2020   Procedure: VIDEO BRONCHOSCOPY WITH ENDOBRONCHIAL NAVIGATION;  Surgeon: Garner Nash, DO;  Location: [place];  Service: Pulmonary;  Laterality: N/A;   VIDEO BRONCHOSCOPY  WITH ENDOBRONCHIAL ULTRASOUND  06/13/2020   Procedure: VIDEO BRONCHOSCOPY WITH ENDOBRONCHIAL ULTRASOUND;  Surgeon: Garner Nash, DO;  Location: [facility];  Service: Pulmonary;;   Family History  Problem Relation Age of Onset   Aneurysm Mother        AAA   Hypertension Mother    Hyperlipidemia Mother    Heart disease Father        CAD, MI x 3   Heart attack Father    Diabetes Sister    Hyperlipidemia Sister    Hypertension Sister    Cancer Maternal Aunt        breast cancer   Cancer Paternal Grandmother        unknown type   Colon cancer Neg Hx    Stomach cancer Neg Hx    Social History   Socioeconomic History   Marital status: Widowed    Spouse name: Not on file   Number of children: 5   Years of education: 9   Highest education level: 9th grade  Occupational History   Occupation: retired    Comment: retired  Tobacco Use   Smoking status: Every Day    Packs/day: 0.50    Years: 70.00    Total pack years: 35.00    Types: Cigarettes   Smokeless tobacco: Never  Vaping Use   Vaping Use: Never used  Substance and Sexual Activity   Alcohol use: Not Currently    Comment: occasionally - few drinks/month but none in 1 year   Drug use: Never   Sexual activity: Not Currently    Partners: Male     Birth control/protection: Surgical    Comment: Hysterectomy  Other Topics Concern   Not on file  Social History Narrative   9th grade. married 2052-09-03- widowed Dec 10th'08. 4 sons - '55, '58, '60, '69 (two of these have passes away); 1 daughter - 2061/09/03 (deceased); 12 grandchildren; 80 great-grandchildren. 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 Other children are close and attentive. ACP/End-of-life: No CPR, no mechanical ventilation, no futile/heroic measures.   Social Determinants of Health   Financial Resource Strain: Low Risk  (08/12/2022)   Overall Financial Resource Strain (CARDIA)    Difficulty of Paying Living Expenses: Not hard at all  Food Insecurity: No Food Insecurity (08/12/2022)   Hunger Vital Sign    Worried About Running Out of Food in the Last Year: Never true    Ran Out of Food in the Last Year: Never true  Transportation Needs: No Transportation Needs (08/12/2022)   PRAPARE - Hydrologist (Medical): No    Lack of Transportation (Non-Medical): No  Physical Activity: Inactive (08/12/2022)   Exercise Vital Sign    Days of Exercise per Week: 0 days    Minutes of Exercise per Session: 0 min  Stress: No Stress Concern Present (08/12/2022)   [place]    Feeling of Stress : Only a little  Social Connections: Socially Isolated (08/12/2022)   Social Connection and Isolation Panel [NHANES]    Frequency of Communication with Friends and Family: Twice a week    Frequency of Social Gatherings with Friends and Family: Twice a week    Attends Religious Services: Never    Marine scientist or Organizations: No    Attends Archivist Meetings: Never    Marital Status: Widowed    Tobacco Counseling Ready  to quit: Not Answered Counseling given: Not Answered   Clinical Intake:  Pre-visit preparation completed: Yes  Pain :  No/denies pain Pain Score: 0-No pain     BMI - recorded: 19.84 Nutritional Status: BMI of 19-24  Normal Nutritional Risks: None Diabetes: No  How often do you need to have someone help you when you read instructions, pamphlets, or other written materials from your doctor or pharmacy?: 1 - Never What is the last grade level you completed in school?: 9th grade  Diabetic? No  Interpreter Needed?: No  Information entered by :: Enio Hornback N. Lyric Rossano, LPN.   Activities of Daily Living    08/12/2022   10:58 AM  In your present state of health, do you have any difficulty performing the following activities:  Hearing? 0  Vision? 0  Difficulty concentrating or making decisions? 0  Walking or climbing stairs? 0  Dressing or bathing? 0  Doing errands, shopping? 0  Preparing Food and eating ? N  Using the Toilet? N  In the past six months, have you accidently leaked urine? N  Do you have problems with loss of bowel control? N  Managing your Medications? N  Managing your Finances? N  Housekeeping or managing your Housekeeping? N    Patient Care Team: Plotnikov, Evie Lacks, MD as PCP - General (Internal Medicine) Lorretta Harp, MD as PCP - Cardiology (Cardiology) Bond, Tracie Harrier, MD as Referring Physician (Ophthalmology) Gerarda Fraction, MD as Referring Physician (Ophthalmology) Marti Sleigh, MD as Attending Physician (Gynecology) Early, Arvilla Meres, MD as Consulting Physician (Vascular Surgery) Ralene Ok, MD as Consulting Physician (General Surgery) Ilean China (Inactive) as Physician Assistant (Cardiology) Drake Leach, Leonidas as Consulting Physician (Optometry) Szabat, Darnelle Maffucci, [facility] (Inactive) as Pharmacist (Pharmacist) Gery Pray, MD as Consulting Physician (Radiation Oncology)  Indicate any recent Medical Services you may have received from other than [facility] providers in the past year (date may be approximate).     Assessment:   This is a routine  wellness examination for Tammy Brooks.  Hearing/Vision screen Hearing Screening - Comments:: Patient has some difficulty hearing; no hearing aids.  Vision Screening - Comments:: Blind in left eye.  Wears rx glasses - up to date with routine eye exams with Raynelle Fanning, MD and Gerarda Fraction, MD   Dietary issues and exercise activities discussed: Current Exercise Habits: The patient does not participate in regular exercise at present, Exercise limited by: respiratory conditions(s);cardiac condition(s);orthopedic condition(s)   Goals Addressed             This Visit's Progress    Client understands the importance of follow-up with providers by attending scheduled visits        Depression Screen    08/12/2022   10:57 AM 06/12/2022   11:25 AM 08/06/2021   11:30 AM 08/06/2021   11:28 AM 08/03/2020   10:00 AM 07/24/2020   10:23 AM 01/12/2020   10:42 AM  PHQ 2/9 Scores  PHQ - 2 Score 0 0 0 0 0 0 0  PHQ- 9 Score 1          Fall Risk    08/12/2022   10:56 AM 06/12/2022   11:24 AM 08/06/2021   11:30 AM 08/03/2020    9:54 AM 07/24/2020   10:23 AM  Fall Risk   Falls in the past year? 0 0 0 0   Number falls in past yr: 0 0 0 0 1  Injury with Fall? 0 0 0 0 1  Risk  for fall due to : No Fall Risks No Fall Risks No Fall Risks No Fall Risks History of fall(s)  Follow up Falls prevention discussed Falls evaluation completed Falls evaluation completed Falls evaluation completed     FALL RISK PREVENTION PERTAINING TO THE HOME:  Any stairs in or around the home? No  If so, are there any without handrails? No  Home free of loose throw rugs in walkways, pet beds, electrical cords, etc? Yes  Adequate lighting in your home to reduce risk of falls? Yes   ASSISTIVE DEVICES UTILIZED TO PREVENT FALLS:  Life alert? No  Use of a cane, walker or w/c? No  Grab bars in the bathroom? No  Shower chair or bench in shower? No  Elevated toilet seat or a handicapped toilet? No   TIMED UP AND GO:  Was the test  performed? No . Telephonic Visit  Cognitive Function:    02/04/2017    9:15 AM  MMSE - Mini Mental State Exam  Orientation to time 5  Orientation to Place 5  Registration 3  Attention/ Calculation 4  Recall 1  Language- name 2 objects 2  Language- repeat 1  Language- follow 3 step command 3  Language- read & follow direction 1  Write a sentence 1  Copy design 1  Total score 27        08/12/2022   10:57 AM 01/06/2019    4:27 PM  6CIT Screen  What Year? 0 points 0 points  What month? 0 points 0 points  What time? 0 points 0 points  Count back from 20 0 points 0 points  Months in reverse 0 points 2 points  Repeat phrase 0 points 0 points  Total Score 0 points 2 points    Immunizations Immunization History  Administered Date(s) Administered   Fluad Quad(high Dose 65+) 02/23/2019, 02/15/2021, 02/20/2022   Influenza Split 03/18/2012   Influenza Whole 03/04/2009   Influenza, High Dose Seasonal PF 02/04/2017, 03/11/2018   Influenza,inj,Quad PF,6+ Mos 04/13/2014, 01/30/2016   Influenza-Unspecified 03/03/2013, 05/09/2015, 04/18/2020   Moderna Sars-Covid-2 Vaccination 08/07/2019, 09/04/2019, 04/18/2020   Pneumococcal Conjugate-13 08/30/2014   Pneumococcal Polysaccharide-23 11/17/2008, 08/21/2017   Td 11/17/2008   Tdap 12/30/2017   Zoster, Live 11/21/2009    TDAP status: Up to date  Flu Vaccine status: Up to date  Pneumococcal vaccine status: Up to date  Covid-19 vaccine status: Completed vaccines  Qualifies for Shingles Vaccine? Yes   Zostavax completed Yes   Shingrix Completed?: No.    Education has been provided regarding the importance of this vaccine. Patient has been advised to call insurance company to determine out of pocket expense if they have not yet received this vaccine. Advised may also receive vaccine at local pharmacy or Health Dept. Verbalized acceptance and understanding.  Screening Tests Health Maintenance  Topic Date Due   COVID-19 Vaccine (4 -  2023-24 season) 02/01/2022   Zoster Vaccines- Shingrix (1 of 2) 09/11/2022 (Originally 10/03/1952)   Medicare Annual Wellness (AWV)  08/12/2023   DTaP/Tdap/Td (3 - Td or Tdap) 12/31/2027   Pneumonia Vaccine 66+ Years old  Completed   INFLUENZA VACCINE  Completed   DEXA SCAN  Completed   HPV VACCINES  Aged Out    Health Maintenance  Health Maintenance Due  Topic Date Due   COVID-19 Vaccine (4 - 2023-24 season) 02/01/2022    Colorectal cancer screening: No longer required.   Mammogram status: No longer required due to age/patient declined.  Bone Density status: Completed  02/23/2019. Results reflect: Bone density results: OSTEOPENIA. Repeat every 2-3 years.  Lung Cancer Screening: (Low Dose CT Chest recommended if Age 47-80 years, 30 pack-year currently smoking OR have quit w/in 15years.) does not qualify.   Lung Cancer Screening Referral: no  Additional Screening:  Hepatitis C Screening: does not qualify; Completed no  Vision Screening: Recommended annual ophthalmology exams for early detection of glaucoma and other disorders of the eye. Is the patient up to date with their annual eye exam?  Yes  Who is the provider or what is the name of the office in which the patient attends annual eye exams? Gerarda Fraction, MD and Raynelle Fanning, MD. If pt is not established with a provider, would they like to be referred to a provider to establish care? No .   Dental Screening: Recommended annual dental exams for proper oral hygiene  Community Resource Referral / Chronic Care Management: CRR required this visit?  No   CCM required this visit?  No      Plan:     I have personally reviewed and noted the following in the patient's chart:   Medical and social history Use of alcohol, tobacco or illicit drugs  Current medications and supplements including opioid prescriptions. Patient is not currently taking opioid prescriptions. Functional ability and status Nutritional status Physical  activity Advanced directives List of other physicians Hospitalizations, surgeries, and ER visits in previous 12 months Vitals Screenings to include cognitive, depression, and falls Referrals and appointments  In addition, I have reviewed and discussed with patient certain preventive protocols, quality metrics, and best practice recommendations. A written personalized care plan for preventive services as well as general preventive health recommendations were provided to patient.     Sheral Flow, LPN   [id number]   Nurse Notes:  Normal cognitive status assessed by direct observation by this Nurse Health Advisor. No abnormalities found.     Medical screening examination/treatment/procedure(s) were performed by non-physician practitioner and as supervising physician I was immediately available for consultation/collaboration.  I agree with above. [name], MD

## 2022-09-27 ENCOUNTER — Encounter: Payer: Self-pay | Admitting: Cardiovascular Disease

## 2022-09-27 ENCOUNTER — Ambulatory Visit: Payer: Medicare Other | Attending: Cardiovascular Disease | Admitting: Cardiovascular Disease

## 2022-09-27 VITALS — BP 132/68 | HR 61 | Ht 61.0 in | Wt 101.6 lb

## 2022-09-27 DIAGNOSIS — I2510 Atherosclerotic heart disease of native coronary artery without angina pectoris: Secondary | ICD-10-CM | POA: Diagnosis not present

## 2022-09-27 DIAGNOSIS — I350 Nonrheumatic aortic (valve) stenosis: Secondary | ICD-10-CM | POA: Diagnosis not present

## 2022-09-27 DIAGNOSIS — Z9861 Coronary angioplasty status: Secondary | ICD-10-CM

## 2022-09-27 DIAGNOSIS — I10 Essential (primary) hypertension: Secondary | ICD-10-CM

## 2022-09-27 DIAGNOSIS — I739 Peripheral vascular disease, unspecified: Secondary | ICD-10-CM

## 2022-09-27 DIAGNOSIS — E785 Hyperlipidemia, unspecified: Secondary | ICD-10-CM | POA: Diagnosis not present

## 2022-09-27 NOTE — Assessment & Plan Note (Signed)
History of essential hypertension a blood pressure measured today at 132/68.  She is on amlodipine, losartan and metoprolol.

## 2022-09-27 NOTE — Patient Instructions (Signed)
Medication Instructions:  Your physician recommends that you continue on your current medications as directed. Please refer to the Current Medication list given to you today.  *If you need a refill on your cardiac medications before your next appointment, please call your pharmacy*   Follow-Up: At [facility], you and your health needs are our priority.  As part of our continuing mission to provide you with exceptional heart care, we have created designated Provider Care Teams.  These Care Teams include your primary Cardiologist (physician) and Advanced Practice Providers (APPs -  Physician Assistants and Nurse Practitioners) who all work together to provide you with the care you need, when you need it.  We recommend signing up for the patient portal called "MyChart".  Sign up information is provided on this After Visit Summary.  MyChart is used to connect with patients for Virtual Visits (Telemedicine).  Patients are able to view lab/test results, encounter notes, upcoming appointments, etc.  Non-urgent messages can be sent to your provider as well.   To learn more about what you can do with MyChart, go to [email or web address].    Your next appointment:   12 month(s)  Provider:   Jonathan Berry, MD    

## 2022-09-27 NOTE — Assessment & Plan Note (Signed)
History of hyperlipidemia on statin therapy with lipid profile performed 02/15/2021 revealing total cholesterol 116, LDL 65 and HDL 32.

## 2022-09-27 NOTE — Assessment & Plan Note (Signed)
History of CAD status post circumflex PCI with DES in [facility] 01/25/2018.  The patient otherwise is asymptomatic.

## 2022-09-27 NOTE — Assessment & Plan Note (Signed)
History of severe aortic stenosis by recent 2D echo performed 07/29/2022 with a valve area of 0.57 cm with a peak gradient of 64 mmHg.  Her EF was slightly reduced at 45 to 50% compared to a normal EF 2 years ago.  Given her frailty and comorbidities I do not think she is a candidate for either SAVR or TAVR.

## 2022-09-27 NOTE — Progress Notes (Signed)
09/27/2022 Tammy Brooks   [birth year]  [id number]  Primary Physician Plotnikov, Georgina Quint, MD Primary Cardiologist: Runell Gess MD Nicholes Calamity, [place]  HPI:  Tammy Brooks is a 89 y.o.   mildly overweight widowed Caucasian female mother of 5, grandmother of 15 grandchildren .I last saw her in the office 07/03/2022.  She lives with her youngest son and one of her other sons lives next-door.  She has a history of mild CAD by cath in 1997. Her lipid problems include continued tobacco abuse one pack per day, hypertension, and hyperlipidemia all medically treated. She does have mild to moderate aortic stenosis with valve area of 1 cm2 by 2-D echo one year ago.. She also has moderate right internal carotid artery stenosis by duplex ultrasound. She was recently diagnosed with a small abdominal aortic and iliac aneurysm followed by Dr. Arbie Cookey. She denies chest pain or shortness of breath. She was found to have a pelvic mass which was surgically excised last year and found to be benign. She does have a hernia as result of this. She has been seen in the emergency room once for chest pain and rule out PE.  CTA was negative for this although it did show coronary calcification consistent with atherosclerosis.  She was scheduled for a Myoview stress test which has been delayed until September because of fiscal constraints.  She does continue to smoke a pack a day.  She currently denies chest pain or shortness of breath.   Since I saw her 3 months ago a follow-up echo shown stable moderate aortic stenosis with normal LV function.  She saw Corine Shelter in the office 02/11/2018 who began her on indoor which it has resulted in marked improvement in her chest pain.  She also has a moderate abdominal aortic aneurysm followed by Dr. Arbie Cookey measuring 5 cm.   Her abdominal aortic aneurysm now measures 5.5 cm followed by Dr. Blase Mess.  Apparently this is not amenable to endoluminal stent grafting because of geometry and  selected not to pursue surgical revascularization.  She apparently was recently found to have a spiculated lung nodule suspicious for cancer for which she has received radiation therapy.  She also was recently diagnosed with colon cancer..  She does continue to smoke.  She denies chest pain or shortness of breath.  Since I saw her 3 months ago she has lost 5 pounds.  She appears frail.  She has less energy.  She apparently has colon cancer but not resectable because of her comorbidities and another potential area in her lung that is suspicious.  She denies chest pain but does have shortness of breath.  She continues to smoke a half a pack a day recalcitrant to resector modification.  Recent 2D echo performed 07/29/2022 revealed slight decline in her LV function to 40 to 45% with severe aortic stenosis and valve area measuring 0.57 cm with a peak gradient of 64 mmHg.   Current Meds  Medication Sig   acetaminophen (TYLENOL) 325 MG tablet Take 325 mg by mouth every 6 (six) hours as needed (pain.).   allopurinol (ZYLOPRIM) 100 MG tablet Take 0.5 tablets (50 mg total) by mouth daily.   amLODipine (NORVASC) 5 MG tablet Take 1 tablet (5 mg total) by mouth daily.   atorvastatin (LIPITOR) 40 MG tablet Take 2 tablets (80 mg total) by mouth daily.   Cholecalciferol (VITAMIN D3) 50 MCG (2000 UT) TABS Take 2,000 Units by mouth daily.   clopidogrel (  PLAVIX) 75 MG tablet Take 1 tablet (75 mg total) by mouth daily.   ferrous sulfate 325 (65 FE) MG EC tablet Take by mouth.   furosemide (LASIX) 20 MG tablet TAKE 1-2 TABLETS BY MOUTH  DAILY AS NEEDED FOR EDEMA.   isosorbide mononitrate (IMDUR) 30 MG 24 hr tablet Take 1 tablet (30 mg total) by mouth daily.   levothyroxine (SYNTHROID) 50 MCG tablet Take 1 tablet (50 mcg total) by mouth daily.   losartan (COZAAR) 25 MG tablet Take 1 tablet (25 mg total) by mouth daily.   metoprolol tartrate (LOPRESSOR) 25 MG tablet Take 0.5 tablets (12.5 mg total) by mouth 2 (two) times  daily.   Multiple Vitamins-Minerals (PRESERVISION/LUTEIN) CAPS Take 1 capsule by mouth 3 (three) times a week. In the morning   nitroGLYCERIN (NITROSTAT) 0.4 MG SL tablet Place 1 tablet (0.4 mg total) under the tongue every 5 (five) minutes x 3 doses as needed for chest pain.   traMADol (ULTRAM) 50 MG tablet Take 0.5-1 tablets (25-50 mg total) by mouth every 8 (eight) hours as needed.     Allergies  Allergen Reactions   Nabumetone Anaphylaxis, Swelling and Other (See Comments)    Causes inflammation of airways   Tape Other (See Comments)    SKIN IS VERY THIN AND TEARS EASILY- PLEASE USE AN ALTERNATIVE TO TAPE, IF POSSIBLE!!    Social History   Socioeconomic History   Marital status: Widowed    Spouse name: Not on file   Number of children: 5   Years of education: 9   Highest education level: 9th grade  Occupational History   Occupation: retired    Comment: retired  Tobacco Use   Smoking status: Every Day    Packs/day: 0.50    Years: 70.00    Additional pack years: 0.00    Total pack years: 35.00    Types: Cigarettes   Smokeless tobacco: Never  Vaping Use   Vaping Use: Never used  Substance and Sexual Activity   Alcohol use: Not Currently    Comment: occasionally - few drinks/month but none in 1 year   Drug use: Never   Sexual activity: Not Currently    Partners: Male    Birth control/protection: Surgical    Comment: Hysterectomy  Other Topics Concern   Not on file  Social History Narrative   9th grade. married 10/22/52- widowed Dec 10th'08. 4 sons - '55, '58, '60, '69 (two of these have passes away); 1 daughter - 2061/10/22 (deceased); 12 grandchildren; 35 great-grandchildren. 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 Other children are close and attentive. ACP/End-of-life: No CPR, no mechanical ventilation, no futile/heroic measures.   Social Determinants of Health   Financial Resource Strain: Low Risk  (08/12/2022)   Overall Financial  Resource Strain (CARDIA)    Difficulty of Paying Living Expenses: Not hard at all  Food Insecurity: No Food Insecurity (08/12/2022)   Hunger Vital Sign    Worried About Running Out of Food in the Last Year: Never true    Ran Out of Food in the Last Year: Never true  Transportation Needs: No Transportation Needs (08/12/2022)   PRAPARE - Administrator, Civil Service (Medical): No    Lack of Transportation (Non-Medical): No  Physical Activity: Inactive (08/12/2022)   Exercise Vital Sign    Days of Exercise per Week: 0 days    Minutes of Exercise per Session: 0 min  Stress: No  Stress Concern Present (08/12/2022)   [employer] of Occupational Health - Occupational Stress Questionnaire    Feeling of Stress : Only a little  Social Connections: Socially Isolated (08/12/2022)   Social Connection and Isolation Panel [NHANES]    Frequency of Communication with Friends and Family: Twice a week    Frequency of Social Gatherings with Friends and Family: Twice a week    Attends Religious Services: Never    Database administrator or Organizations: No    Attends Banker Meetings: Never    Marital Status: Widowed  Intimate Partner Violence: Not At Risk (08/12/2022)   Humiliation, Afraid, Rape, and Kick questionnaire    Fear of Current or Ex-Partner: No    Emotionally Abused: No    Physically Abused: No    Sexually Abused: No     Review of Systems: General: negative for chills, fever, night sweats or weight changes.  Cardiovascular: negative for chest pain, dyspnea on exertion, edema, orthopnea, palpitations, paroxysmal nocturnal dyspnea or shortness of breath Dermatological: negative for rash Respiratory: negative for cough or wheezing Urologic: negative for hematuria Abdominal: negative for nausea, vomiting, diarrhea, bright red blood per rectum, melena, or hematemesis Neurologic: negative for visual changes, syncope, or dizziness All other systems reviewed and are  otherwise negative except as noted above.    Blood pressure 132/68, pulse 61, height 5\' 1"  (1.549 m), weight 101 lb 9.6 oz (46.1 kg), SpO2 95 %.  General appearance: alert and no distress Neck: no adenopathy, no carotid bruit, no JVD, supple, symmetrical, trachea midline, and thyroid not enlarged, symmetric, no tenderness/mass/nodules Lungs: clear to auscultation bilaterally Heart: 2/6 outflow tract murmur consistent with aortic stenosis. Extremities: extremities normal, atraumatic, no cyanosis or edema Pulses: 2+ and symmetric Skin: Skin color, texture, turgor normal. No rashes or lesions Neurologic: Grossly normal  EKG not performed today  ASSESSMENT AND PLAN:   Dyslipidemia History of hyperlipidemia on statin therapy with lipid profile performed 02/15/2021 revealing total cholesterol 116, LDL 65 and HDL 32.  Essential hypertension History of essential hypertension a blood pressure measured today at 132/68.  She is on amlodipine, losartan and metoprolol.  Peripheral vascular disease (HCC) History of peripheral arterial disease with a known abdominal aortic aneurysm measuring 5.5 cm without surgical options given her comorbidities.  CAD S/P percutaneous coronary angioplasty History of CAD status post circumflex PCI with DES in [facility] 01/25/2018.  The patient otherwise is asymptomatic.  Severe aortic stenosis History of severe aortic stenosis by recent 2D echo performed 07/29/2022 with a valve area of 0.57 cm with a peak gradient of 64 mmHg.  Her EF was slightly reduced at 45 to 50% compared to a normal EF 2 years ago.  Given her frailty and comorbidities I do not think she is a candidate for either SAVR or TAVR.     Runell Gess MD FACP,FACC,FAHA, [facility] 09/27/2022 11:36 AM

## 2022-09-27 NOTE — Assessment & Plan Note (Signed)
History of peripheral arterial disease with a known abdominal aortic aneurysm measuring 5.5 cm without surgical options given her comorbidities.

## 2022-10-09 DIAGNOSIS — R051 Acute cough: Secondary | ICD-10-CM | POA: Diagnosis not present

## 2022-10-09 DIAGNOSIS — R0600 Dyspnea, unspecified: Secondary | ICD-10-CM | POA: Diagnosis not present

## 2022-10-09 DIAGNOSIS — R0981 Nasal congestion: Secondary | ICD-10-CM | POA: Diagnosis not present

## 2022-10-09 DIAGNOSIS — R062 Wheezing: Secondary | ICD-10-CM | POA: Diagnosis not present

## 2022-10-13 DIAGNOSIS — J441 Chronic obstructive pulmonary disease with (acute) exacerbation: Secondary | ICD-10-CM | POA: Diagnosis not present

## 2022-10-13 DIAGNOSIS — R0602 Shortness of breath: Secondary | ICD-10-CM | POA: Diagnosis not present

## 2022-10-13 DIAGNOSIS — I219 Acute myocardial infarction, unspecified: Secondary | ICD-10-CM | POA: Diagnosis not present

## 2022-10-13 DIAGNOSIS — I10 Essential (primary) hypertension: Secondary | ICD-10-CM | POA: Diagnosis not present

## 2022-10-13 DIAGNOSIS — J449 Chronic obstructive pulmonary disease, unspecified: Secondary | ICD-10-CM | POA: Diagnosis not present

## 2022-10-13 DIAGNOSIS — F17210 Nicotine dependence, cigarettes, uncomplicated: Secondary | ICD-10-CM | POA: Diagnosis not present

## 2022-10-20 ENCOUNTER — Other Ambulatory Visit: Payer: Self-pay | Admitting: Internal Medicine

## 2022-10-28 ENCOUNTER — Other Ambulatory Visit: Payer: Self-pay

## 2022-10-28 ENCOUNTER — Emergency Department (HOSPITAL_COMMUNITY): Payer: Medicare Other

## 2022-10-28 ENCOUNTER — Emergency Department (HOSPITAL_COMMUNITY)
Admission: EM | Admit: 2022-10-28 | Discharge: 2022-10-28 | Disposition: A | Discharge status: Home / Self Care | Payer: Medicare Other | Attending: Emergency Medicine | Admitting: Emergency Medicine

## 2022-10-28 ENCOUNTER — Encounter (HOSPITAL_COMMUNITY): Payer: Self-pay

## 2022-10-28 DIAGNOSIS — Z7902 Long term (current) use of antithrombotics/antiplatelets: Secondary | ICD-10-CM | POA: Diagnosis not present

## 2022-10-28 DIAGNOSIS — I714 Abdominal aortic aneurysm, without rupture, unspecified: Secondary | ICD-10-CM | POA: Diagnosis not present

## 2022-10-28 DIAGNOSIS — Z85038 Personal history of other malignant neoplasm of large intestine: Secondary | ICD-10-CM | POA: Diagnosis not present

## 2022-10-28 DIAGNOSIS — R0602 Shortness of breath: Secondary | ICD-10-CM | POA: Insufficient documentation

## 2022-10-28 DIAGNOSIS — Z8673 Personal history of transient ischemic attack (TIA), and cerebral infarction without residual deficits: Secondary | ICD-10-CM | POA: Insufficient documentation

## 2022-10-28 DIAGNOSIS — J439 Emphysema, unspecified: Secondary | ICD-10-CM | POA: Diagnosis not present

## 2022-10-28 DIAGNOSIS — J449 Chronic obstructive pulmonary disease, unspecified: Secondary | ICD-10-CM | POA: Insufficient documentation

## 2022-10-28 LAB — CBC WITH DIFFERENTIAL/PLATELET
Abs Immature Granulocytes: 0.02 10*3/uL (ref 0.00–0.07)
Basophils Absolute: 0.1 10*3/uL (ref 0.0–0.1)
Basophils Relative: 1 %
Eosinophils Absolute: 0.1 10*3/uL (ref 0.0–0.5)
Eosinophils Relative: 1 %
HCT: 34.6 % — ABNORMAL LOW (ref 36.0–46.0)
Hemoglobin: 10.4 g/dL — ABNORMAL LOW (ref 12.0–15.0)
Immature Granulocytes: 0 %
Lymphocytes Relative: 17 %
Lymphs Abs: 1.2 10*3/uL (ref 0.7–4.0)
MCH: 24.2 pg — ABNORMAL LOW (ref 26.0–34.0)
MCHC: 30.1 g/dL (ref 30.0–36.0)
MCV: 80.5 fL (ref 80.0–100.0)
Monocytes Absolute: 0.6 10*3/uL (ref 0.1–1.0)
Monocytes Relative: 8 %
Neutro Abs: 5.3 10*3/uL (ref 1.7–7.7)
Neutrophils Relative %: 73 %
Platelets: 234 10*3/uL (ref 150–400)
RBC: 4.3 MIL/uL (ref 3.87–5.11)
RDW: 16.5 % — ABNORMAL HIGH (ref 11.5–15.5)
WBC: 7.2 10*3/uL (ref 4.0–10.5)
nRBC: 0 % (ref 0.0–0.2)

## 2022-10-28 LAB — BASIC METABOLIC PANEL
Anion gap: 11 (ref 5–15)
BUN: 20 mg/dL (ref 8–23)
CO2: 20 mmol/L — ABNORMAL LOW (ref 22–32)
Calcium: 8.9 mg/dL (ref 8.9–10.3)
Chloride: 107 mmol/L (ref 98–111)
Creatinine, Ser: 1.2 mg/dL — ABNORMAL HIGH (ref 0.44–1.00)
GFR, Estimated: 44 mL/min — ABNORMAL LOW (ref >60)
Glucose, Bld: 98 mg/dL (ref 70–99)
Potassium: 4.1 mmol/L (ref 3.5–5.1)
Sodium: 138 mmol/L (ref 135–145)

## 2022-10-28 LAB — BRAIN NATRIURETIC PEPTIDE: B Natriuretic Peptide: 256 pg/mL — ABNORMAL HIGH (ref 0.0–100.0)

## 2022-10-28 LAB — TROPONIN I (HIGH SENSITIVITY)
Troponin I (High Sensitivity): 13 ng/L (ref <18)
Troponin I (High Sensitivity): 13 ng/L (ref <18)

## 2022-10-28 MED ORDER — IOHEXOL 350 MG/ML SOLN
75.0000 mL | Freq: Once | INTRAVENOUS | Status: AC | PRN
  Administered 2022-10-28: 75 mL via INTRAVENOUS

## 2022-10-28 MED ORDER — ACETAMINOPHEN 325 MG PO TABS
650.0000 mg | ORAL_TABLET | Freq: Once | ORAL | Status: AC
  Administered 2022-10-28: 650 mg via ORAL
  Filled 2022-10-28: qty 2

## 2022-10-28 NOTE — ED Notes (Signed)
This RN reviewed discharge instructions with patient and family. All verbalized understanding and denied any further questions. PT well appearing upon discharge and reports tolerable pain. Pt wheeled to exit. Pt endorses ride home.

## 2022-10-28 NOTE — ED Triage Notes (Addendum)
SOB x 1 week. Has been seen at [facility] and was told she didn't have PNA but [facility] told her two days before that she had PNA.  Patient reports there is something in her chest she can't get out.  Reports can't sleep due to breathing.  Family reports she is in pain. Hx of lung cancer, which she was treated for and then dx with colon cancer but not receiving treatment.  Patient has been having hot sweats as well.

## 2022-10-28 NOTE — ED Notes (Signed)
Pt is tearful. NAD noted at this time.

## 2022-10-28 NOTE — ED Provider Notes (Signed)
Accepted handoff at shift change from [facility]. Please see prior provider note for more detail.   Briefly: Patient is 89 y.o.   DDX: concern for Colon, lung cancer. Not being treated for either. Cough shob for 1 month, UC gave steroids, abx, no improvement. Then dx with COPD. Treated with more steroids. Not fluid overloaded clinically. Using albuterol q4. Looks like frail old lady, not in acute respiratory distress. Pending CTA chest. Coarse lung sounds without significant wheeze. Ambulate, reassess after imaging. Probably going home.   Plan: Independently interpreted CT angio chest PE with without contrast which shows  1. Negative examination for pulmonary embolism.  2. New, very bulky right hilar and lymphadenopathy, consistent with  nodal metastatic disease.  3. Bandlike treated appearance of a nodule in the peripheral right  upper lobe with adjacent fiducial markers.  4. Emphysema.  5. Cardiomegaly and coronary artery disease.  6. Dense aortic valve calcifications. Correlate for  echocardiographic evidence of aortic valve dysfunction.  7. Large, partially imaged aneurysm of the abdominal aorta,  measuring least 7.9 x 7.4 cm and apparently enlarged compared to  prior examination of the abdomen and pelvis dated 11/15/2021.  Consider vascular referral and dedicated imaging of the abdomen  pelvis if clinically appropriate.  8. Nonobstructive bilateral nephrolithiasis.    Discussed these findings with the patient and her family, they had known and were monitoring the aortic aneurysm, but were told that they did not need to follow-up with the vascular team, and per daughter's report it is stable compared to most recent previous.  She was able to ambulate without any oxygen desaturation.  Patient with stable oxygen saturation without acute respiratory distress in the emergency department.  She does have mildly elevated BNP I think it reasonable for her to take some of her home  Lasix for around 5 days, and follow-up with her PCP, I attempted to make a referral to palliative care for this patient so that she can reengage as she had previously declined, but continues to have worsening sequelae of her known cancer.      RISR  EDTHIS    [place] 10/28/22 1708    Benjiman Core, MD 10/28/22 [id number]

## 2022-10-28 NOTE — ED Notes (Signed)
PT to CT well appearing upon transport

## 2022-10-28 NOTE — ED Provider Notes (Signed)
[name] Provider Note   CSN: [id number] Arrival date & time: 10/28/22  1204     History  Chief Complaint  Patient presents with   Shortness of Breath    Tammy Brooks is a 89 y.o. female history of CVA, AAA, lower extremity edema, cancer in remission, now with untreated colon cancer for evaluation of shortness of breath.  She was seen at an urgent care and was told she had pneumonia.  Was treated with a round of steroids and antibiotics.  She was seen at emergency department subsequently after this was told she did not have pneumonia however she had COPD.  She was given albuterol inhaler.  She states she still has some shortness of breath, cough, sensation of unable to "cough it up."  As well as generalized weakness and jitteriness.  It is very jittery after using the albuterol inhaler.  She does started using this.  She denies any prior history of PE or DVT that she knows of.  She is on Plavix.  Denies any overt chest pain however states "I cannot cough it up."  No back pain, numbness, lateral weakness.  No lower extremity pain or swelling.   New diagnosis of COPD per family in room.On steroids and albuterol at home  HPI     Home Medications Prior to Admission medications   Medication Sig Start Date End Date Taking? Authorizing Provider  acetaminophen (TYLENOL) 325 MG tablet Take 325 mg by mouth every 6 (six) hours as needed (pain.).    [provider]  allopurinol (ZYLOPRIM) 100 MG tablet Take 0.5 tablets (50 mg total) by mouth daily. 06/12/22   Plotnikov, Georgina Quint, MD  amLODipine (NORVASC) 5 MG tablet Take 1 tablet (5 mg total) by mouth daily. 06/12/22   Plotnikov, Georgina Quint, MD  atorvastatin (LIPITOR) 40 MG tablet Take 2 tablets (80 mg total) by mouth daily. 06/12/22   Plotnikov, Georgina Quint, MD  Cholecalciferol (VITAMIN D3) 50 MCG (2000 UT) TABS Take 2,000 Units by mouth daily.    [provider]  clopidogrel (PLAVIX)  75 MG tablet Take 1 tablet (75 mg total) by mouth daily. 06/12/22   Plotnikov, Georgina Quint, MD  ferrous sulfate 325 (65 FE) MG EC tablet Take by mouth.    [provider]  furosemide (LASIX) 20 MG tablet TAKE 1-2 TABLETS BY MOUTH  DAILY AS NEEDED FOR EDEMA. 12/28/21   Jodelle Gross, NP  isosorbide mononitrate (IMDUR) 30 MG 24 hr tablet Take 1 tablet (30 mg total) by mouth daily. 06/12/22   Plotnikov, Georgina Quint, MD  levothyroxine (SYNTHROID) 50 MCG tablet Take 1 tablet (50 mcg total) by mouth daily. 06/12/22   Plotnikov, Georgina Quint, MD  losartan (COZAAR) 25 MG tablet Take 1 tablet (25 mg total) by mouth daily. 06/12/22   Plotnikov, Georgina Quint, MD  metoprolol tartrate (LOPRESSOR) 25 MG tablet Take 0.5 tablets (12.5 mg total) by mouth 2 (two) times daily. Must keep July appt for future refills 10/21/22   Plotnikov, Georgina Quint, MD  Multiple Vitamins-Minerals (PRESERVISION/LUTEIN) CAPS Take 1 capsule by mouth 3 (three) times a week. In the morning    [provider]  nitroGLYCERIN (NITROSTAT) 0.4 MG SL tablet Place 1 tablet (0.4 mg total) under the tongue every 5 (five) minutes x 3 doses as needed for chest pain. 12/28/21   Jodelle Gross, NP  traMADol (ULTRAM) 50 MG tablet Take 0.5-1 tablets (25-50 mg total) by mouth every 8 (  eight) hours as needed. 01/12/20   Plotnikov, Georgina Quint, MD      Allergies    Nabumetone and Tape    Review of Systems   Review of Systems  Constitutional:  Positive for activity change and appetite change.  HENT: Negative.    Respiratory:  Positive for cough, chest tightness and shortness of breath. Negative for apnea, choking, wheezing and stridor.   Cardiovascular:  Negative for chest pain.  Gastrointestinal: Negative.   Genitourinary: Negative.   Musculoskeletal: Negative.   Skin: Negative.   Neurological:  Positive for weakness.  All other systems reviewed and are negative.   Physical Exam Updated Vital Signs BP 127/70   Pulse 84   Temp 97.9 F  (36.6 C)   Resp 17   Ht 5\' 1"  (1.549 m)   Wt 45.8 kg   LMP  (LMP Unknown)   SpO2 98%   BMI 19.08 kg/m  Physical Exam Vitals and nursing note reviewed.  Constitutional:      General: She is not in acute distress.    Appearance: She is well-developed. She is not ill-appearing, toxic-appearing or diaphoretic.  HENT:     Head: Normocephalic and atraumatic.  Eyes:     Pupils: Pupils are equal, round, and reactive to light.  Cardiovascular:     Rate and Rhythm: Normal rate.     Pulses: Normal pulses.          Radial pulses are 2+ on the right side and 2+ on the left side.       Dorsalis pedis pulses are 2+ on the right side and 2+ on the left side.     Heart sounds: Normal heart sounds.  Pulmonary:     Effort: Pulmonary effort is normal. No respiratory distress.     Breath sounds: Normal breath sounds.     Comments: speaks in full sentences, coarse cough in room. Abdominal:     General: Bowel sounds are normal. There is no distension.     Palpations: Abdomen is soft.  Musculoskeletal:        General: Normal range of motion.     Cervical back: Normal range of motion.     Comments: No bony tenderness, compartments soft.  Soft, nontender  Skin:    General: Skin is warm and dry.     Capillary Refill: Capillary refill takes less than 2 seconds.  Neurological:     General: No focal deficit present.     Mental Status: She is alert.  Psychiatric:        Mood and Affect: Mood normal.     ED Results / Procedures / Treatments   Labs (all labs ordered are listed, but only abnormal results are displayed) Labs Reviewed  CBC WITH DIFFERENTIAL/PLATELET - Abnormal; Notable for the following components:      Result Value   Hemoglobin 10.4 (*)    HCT 34.6 (*)    MCH 24.2 (*)    RDW 16.5 (*)    All other components within normal limits  BASIC METABOLIC PANEL - Abnormal; Notable for the following components:   CO2 20 (*)    Creatinine, Ser 1.20 (*)    GFR, Estimated 44 (*)    All  other components within normal limits  BRAIN NATRIURETIC PEPTIDE  TROPONIN I (HIGH SENSITIVITY)  TROPONIN I (HIGH SENSITIVITY)    EKG EKG Interpretation  Date/Time:  Monday Oct 28 2022 12:05:32 EDT Ventricular Rate:  71 PR Interval:  160 QRS Duration: 102 QT  Interval:  414 QTC Calculation: 449 R Axis:   -35 Text Interpretation: Normal sinus rhythm Left axis deviation Anterior infarct , age undetermined Poor data quality in current ECG precludes serial comparison Confirmed by Pricilla Loveless [phone]) on 10/28/2022 2:08:02 PM  Radiology DG Chest 2 View  Result Date: 10/28/2022 CLINICAL DATA:  Shortness of breath. EXAM: CHEST - 2 VIEW COMPARISON:  chest radiographs 10/13/2022 and 06/03/2021; CT chest 03/15/2022 FINDINGS: Cardiac silhouette and mediastinal contours are within normal limits. High-grade calcifications within the aortic arch. Fiducial markers are again seen within the right upper lobe where there is a chronic area of linear and nodular architectural distortion. Additional fiducial marker within the left lower lobe in a region of mild linear scarring seen on prior CT. There is resolution of the prior right lower lung linear and heterogeneous airspace opacity seen on 06/03/2021 radiographs. Mild chronic bilateral interstitial thickening is similar to prior. No definite acute airspace opacity. No definite pleural effusion. No pneumothorax. Moderate multilevel degenerative disc changes of the thoracic spine. IMPRESSION: 1. Fiducial markers within the right upper lobe and left lower lobe in a region of mild linear scarring seen on prior CT again compatible with radiation fibrosis. 2. Mild chronic lateral interstitial thickening. No definite acute airspace opacity. Electronically Signed   By: Neita Garnet M.D.   On: 10/28/2022 14:06    Procedures Procedures    Medications Ordered in ED Medications - No data to display  ED Course/ Medical Decision Making/ A&P Clinical Course as of  10/28/22 1529  Mon Oct 28, 2022  1513 Colon, lung cancer. Not being treated for either. Cough shob for 1 month, UC gave steroids, abx, no improvement. Then dx with COPD. Treated with more steroids. Not fluid overloaded clinically. Using albuterol q4. Looks like frail old lady, not in acute respiratory distress. Pending CTA chest. Coarse lung sounds without significant wheeze. Ambulate, reassess after imaging. Probably going home.  [CP]    Clinical Course User Index [CP] Olene Floss, PA-C   89 year old here for evaluation of shortness of breath over the last week and a half.  Was treated with antibiotics, steroids and albuterol.  She feels like she cannot "get it out."  MAR at bedside does show as needed Lasix, she denies any lower extremity edema however states her cough and shortness of breath is worse at night when she lays down.  Feels overall "jittery" I suspect this is likely related due to her albuterol use.  She does have known history of cancer not currently under treatment.  No history of PE or DVT.  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  Disposition per oncoming provider                             Medical Decision Making Amount and/or Complexity of Data Reviewed Independent Historian:     Details: Family in room External Data Reviewed: labs, radiology, ECG and notes. Labs: ordered. Decision-making details documented in ED Course. Radiology: ordered and independent interpretation performed. Decision-making details documented in ED Course. ECG/medicine tests: ordered and independent interpretation performed.  Decision-making details documented in ED Course.  Risk  OTC drugs. Prescription drug management. Parenteral controlled substances. Decision regarding hospitalization. Diagnosis or treatment significantly limited by social determinants of health.           Final Clinical Impression(s) / ED Diagnoses Final diagnoses:  SOB (shortness of breath)    Rx / DC Orders ED Discharge Orders     None         Colbey Wirtanen A, PA-C 10/28/22 1529    Pricilla Loveless, MD 10/28/22 1537

## 2022-10-28 NOTE — Discharge Instructions (Signed)
I would take the Lasix medication that you have at home for around 5 days to see if it helps with your breathing at all.  You can use your breathing treatments only as needed if they make you feel jittery and weak.  Ultimately I think that it would benefit you to follow-up with a palliative care physician to help with comfort and options to help you live your best life as your cancer progresses.  Return if you have severe worsening of your shortness of breath, develop chest pain, severe abdominal pain.

## 2022-11-01 ENCOUNTER — Telehealth: Payer: Self-pay

## 2022-11-01 NOTE — Telephone Encounter (Signed)
Transition Care Management Unsuccessful Follow-up Telephone Call  Date of discharge and from where:  [place] 5/27  Attempts:  1st Attempt  Reason for unsuccessful TCM follow-up call:  Left voice message   Tammy Brooks [facility] Guide, [place] [phone] [address], [place], [place] [address] Phone: [phone] Email: [email or web address]       

## 2022-11-05 ENCOUNTER — Telehealth: Payer: Self-pay

## 2022-11-05 NOTE — Telephone Encounter (Signed)
Transition Care Management Unsuccessful Follow-up Telephone Call  Date of discharge and from where:  [facility] 5/27  Attempts:  2nd Attempt  Reason for unsuccessful TCM follow-up call:  Left voice message   Lenard Forth [facility] Guide, [facility] [phone] [address] [place], [place], [place] [address] Phone: [phone] Email: [email or web address]

## 2022-11-06 ENCOUNTER — Other Ambulatory Visit: Payer: Medicare Other

## 2022-11-06 DIAGNOSIS — Z515 Encounter for palliative care: Secondary | ICD-10-CM

## 2022-11-06 NOTE — Progress Notes (Signed)
COMMUNITY PALLIATIVE CARE SW NOTE  PATIENT NAME: Tammy Brooks DOB: 10-Apr-1934 MRN: [id number]  PRIMARY CARE PROVIDER: Tresa Garter, MD  RESPONSIBLE PARTY:  Acct ID - Guarantor Home Phone Work Phone Relationship Acct Type  [id number] ANNALYSA, BILLINGSLY [phone]  Self P/F     [address], [place], [place] [address]   Initial Palliative Care Encounter/Clinical Social Work  [employer] SW completed call to patient, her son-Vernon was present with her. SW provided education to them regarding the palliative care program, services, that visits will be done via telephonically and the support available. SW inquired as to how the patient was doing. Patient's son reported that patient is having a lot of abdominal pain to ribs due to her cancer metastasizing. She is weaker overall, where she having to hold on to furniture to ambulate. Patient has had weight loss. Her current weight is is 101 lbs. Which is down from and 110 lbs a month ago. Patient is spending more time in bed. She is also having a difficulty completing personal care tasks.  Patient is no longer a candidate for cancer treatments. SW provided education regarding hospice services. Patient and son was open to hospice services.   SW consulted with the palliative care nurse who will see patient to further assess.   SW scheduled a visit for 11/08/22 @ 11 am.  Social History   Tobacco Use   Smoking status: Former    Packs/day: 0.50    Years: 70.00    Additional pack years: 0.00    Total pack years: 35.00    Types: Cigarettes   Smokeless tobacco: Never  Substance Use Topics   Alcohol use: Not Currently    Comment: occasionally - few drinks/month but none in 1 year    CODE STATUS: Full Code ADVANCED DIRECTIVES: No MOST FORM COMPLETE: No HOSPICE EDUCATION PROVIDED: No  Duration of encounter and documentation: 30 minutes.  [address] [place], [place]

## 2022-11-08 ENCOUNTER — Other Ambulatory Visit: Payer: Medicare Other

## 2022-11-08 ENCOUNTER — Telehealth: Payer: Self-pay | Admitting: Internal Medicine

## 2022-11-08 VITALS — BP 128/74 | HR 82 | Resp 20

## 2022-11-08 DIAGNOSIS — Z515 Encounter for palliative care: Secondary | ICD-10-CM

## 2022-11-08 NOTE — Telephone Encounter (Signed)
Chakyra from [facility] called with concerns about the pt's new referral.   Please call and advise:  [phone]

## 2022-11-08 NOTE — Progress Notes (Signed)
1100 Palliative Care Initial Encounter Note   PATIENT NAME: Tammy Brooks DOB: 09-20-1933 MRN: [id number]  PRIMARY CARE PROVIDER: Tresa Garter, MD  RESPONSIBLE PARTY:  Acct ID - Guarantor Home Phone Work Phone Relationship Acct Type  [id number] Tammy Brooks, Tammy Brooks [phone]  Self P/F     [address] [place], [place], [place] [address]   RN completed home visit. Son, Tammy Brooks [phone] and daughter in law also present     HISTORY OF PRESENT ILLNESS:  89 year old female we are following with history of dyslipidemia, hypertension, AAA most recent measurement of 5.5 cm, ongoing tobacco abuse, CAD status post PTCA in 1997, severe aortic valve stenosis (asymptomatic), hyperlipidemia, hypertension, thyroid disease, prior history of CVA, lung cancer in 2022, and was found on PET scan on 06/12/2020 showing hypermetabolic spiculated 2.0 cm right upper lobe pulmonary nodule compatible with primary bronchogenic carcinoma.  She was also have found to have Suspicious for pulmonary metastasis.  She underwent radiation therapy for this.   Socially: Lives in single story modular home with youngest son. Pt has lived here since 2000. Oldest son and daughter in law live right next door.    Cognitive: Alert and oriented.    Appetite: Pt and family reports that pt is "not really eating anymore." Pt says she is never hungry. Has ensure available to her but she only drinks maybe once a week. Does endorse drinking approx. 24 oz of water.  Weight history 05/2020- 129.8 08/2021- 116 1/22024- 106 09/2022- 101 10/2022- 100.7  Resp: Pt reports that she stopped smoking 3 weeks ago. Started smoking when she was 89 years old. Pt short of breath while talking with RN. O2 sats checked during talking- 90-92%. Reports that she "didn't qualify for oxygen in the hospital".    Mobility: ambulates without walker in house. Uses touch on furniture and walls. Pt becomes very short of breath.    Sleeping Pattern: Reports  getting up several times a night to use the bathroom and has difficulty going back to sleep. Goes to bed at 930p and gets up between 930-1000am. Denies napping during the day.  Pain: Pt has chronic abdominal pain. Reports having a tumor to her right side of abdomen, surgically removed. Now has a "hernia" to left side of abdomen and has pain from midline abdomen around her side and back. States, "they said it is my stomach coming thru to the skin from where I didn't heal well"  Gi/GU: denies issues with voiding. Has issues with constipation. Uses milk of magnesia and miralax when needed.  Palliative Care/ Hospice: RN explained role and purpose of palliative care including visit frequency. Also discussed benefits of hospice care as well as the differences between the two with patient.  Pt open to hospice referral. RN to get order from Dr. Posey Rea.  Goals of Care: Stay at home.    CODE STATUS:  ADVANCED DIRECTIVES: Yes MOST FORM: No HCPOA: Son Tammy Brooks PPS: 40%  Next appt scheduled: dependent on hospice eval.     PHYSICAL EXAM:   VITALS: Today's Vitals   11/08/22 1138  BP: 128/74  Pulse: 82  Resp: 20  SpO2: 91%  PainSc: 4   PainLoc: Abdomen    LUNGS: diminished bilaterally with inspiratory wheezes bilat in bases, +use of access muscles at times. CARDIAC:  Regular, no JVD EXTREMITIES: MAE x 4, no edema SKIN: WNL   Barbette Merino, RN

## 2022-11-11 ENCOUNTER — Other Ambulatory Visit: Payer: Self-pay | Admitting: Internal Medicine

## 2022-11-11 DIAGNOSIS — C3432 Malignant neoplasm of lower lobe, left bronchus or lung: Secondary | ICD-10-CM

## 2022-11-11 DIAGNOSIS — I2510 Atherosclerotic heart disease of native coronary artery without angina pectoris: Secondary | ICD-10-CM

## 2022-11-11 DIAGNOSIS — C184 Malignant neoplasm of transverse colon: Secondary | ICD-10-CM

## 2022-11-11 NOTE — Telephone Encounter (Signed)
The ref was made Thx

## 2022-11-12 NOTE — Telephone Encounter (Signed)
Hospice referral has been received, thank you!

## 2022-11-14 ENCOUNTER — Other Ambulatory Visit: Payer: Self-pay | Admitting: Internal Medicine

## 2022-11-18 ENCOUNTER — Inpatient Hospital Stay: Payer: Medicare Other | Admitting: Internal Medicine

## 2022-12-11 ENCOUNTER — Ambulatory Visit: Payer: Medicare Other | Admitting: Internal Medicine

## 2022-12-29 ENCOUNTER — Other Ambulatory Visit: Payer: Self-pay | Admitting: Internal Medicine

## 2023-02-02 DEATH — deceased

## 2023-09-12 ENCOUNTER — Telehealth: Payer: Self-pay

## 2023-09-12 NOTE — Telephone Encounter (Addendum)
 Left msg on pt's vm to call & schedule an AWV appt. Later called son, Varonica Siharath and was told the pt is deceased (5-mths ago).   Medical screening examination/treatment/procedure(s) were performed by non-physician practitioner and as supervising physician I was immediately available for consultation/collaboration.  I agree with above. Jacinta Shoe, MD
